# Patient Record
Sex: Female | Born: 1945 | Race: White | Hispanic: No | Marital: Married | Smoking: Never smoker
Health system: Southern US, Community
[De-identification: ages and names within clinical notes are randomized; demographics above are authoritative.]

## PROBLEM LIST (undated history)

## (undated) DIAGNOSIS — I509 Heart failure, unspecified: Secondary | ICD-10-CM

## (undated) DIAGNOSIS — J189 Pneumonia, unspecified organism: Secondary | ICD-10-CM

## (undated) DIAGNOSIS — E785 Hyperlipidemia, unspecified: Secondary | ICD-10-CM

## (undated) DIAGNOSIS — H409 Unspecified glaucoma: Secondary | ICD-10-CM

## (undated) DIAGNOSIS — N888 Other specified noninflammatory disorders of cervix uteri: Secondary | ICD-10-CM

## (undated) DIAGNOSIS — Z923 Personal history of irradiation: Secondary | ICD-10-CM

## (undated) DIAGNOSIS — Z8489 Family history of other specified conditions: Secondary | ICD-10-CM

## (undated) DIAGNOSIS — K760 Fatty (change of) liver, not elsewhere classified: Secondary | ICD-10-CM

## (undated) DIAGNOSIS — F329 Major depressive disorder, single episode, unspecified: Secondary | ICD-10-CM

## (undated) DIAGNOSIS — R002 Palpitations: Secondary | ICD-10-CM

## (undated) DIAGNOSIS — R739 Hyperglycemia, unspecified: Secondary | ICD-10-CM

## (undated) DIAGNOSIS — I2510 Atherosclerotic heart disease of native coronary artery without angina pectoris: Secondary | ICD-10-CM

## (undated) DIAGNOSIS — G4730 Sleep apnea, unspecified: Secondary | ICD-10-CM

## (undated) DIAGNOSIS — F32A Depression, unspecified: Secondary | ICD-10-CM

## (undated) DIAGNOSIS — C4490 Unspecified malignant neoplasm of skin, unspecified: Secondary | ICD-10-CM

## (undated) DIAGNOSIS — E7800 Pure hypercholesterolemia, unspecified: Secondary | ICD-10-CM

## (undated) DIAGNOSIS — M1990 Unspecified osteoarthritis, unspecified site: Secondary | ICD-10-CM

## (undated) DIAGNOSIS — J449 Chronic obstructive pulmonary disease, unspecified: Secondary | ICD-10-CM

## (undated) DIAGNOSIS — I10 Essential (primary) hypertension: Secondary | ICD-10-CM

## (undated) DIAGNOSIS — R0600 Dyspnea, unspecified: Secondary | ICD-10-CM

## (undated) DIAGNOSIS — I491 Atrial premature depolarization: Secondary | ICD-10-CM

## (undated) DIAGNOSIS — I82409 Acute embolism and thrombosis of unspecified deep veins of unspecified lower extremity: Secondary | ICD-10-CM

## (undated) DIAGNOSIS — N6019 Diffuse cystic mastopathy of unspecified breast: Secondary | ICD-10-CM

## (undated) DIAGNOSIS — C50912 Malignant neoplasm of unspecified site of left female breast: Secondary | ICD-10-CM

## (undated) DIAGNOSIS — I517 Cardiomegaly: Secondary | ICD-10-CM

## (undated) DIAGNOSIS — K219 Gastro-esophageal reflux disease without esophagitis: Secondary | ICD-10-CM

## (undated) DIAGNOSIS — I739 Peripheral vascular disease, unspecified: Secondary | ICD-10-CM

## (undated) DIAGNOSIS — Z8619 Personal history of other infectious and parasitic diseases: Secondary | ICD-10-CM

## (undated) DIAGNOSIS — R7989 Other specified abnormal findings of blood chemistry: Secondary | ICD-10-CM

## (undated) DIAGNOSIS — Z973 Presence of spectacles and contact lenses: Secondary | ICD-10-CM

## (undated) DIAGNOSIS — I6529 Occlusion and stenosis of unspecified carotid artery: Secondary | ICD-10-CM

## (undated) HISTORY — DX: Unspecified glaucoma: H40.9

## (undated) HISTORY — DX: Peripheral vascular disease, unspecified: I73.9

## (undated) HISTORY — DX: Depression, unspecified: F32.A

## (undated) HISTORY — DX: Diffuse cystic mastopathy of unspecified breast: N60.19

## (undated) HISTORY — PX: CARPAL TUNNEL RELEASE: SHX101

## (undated) HISTORY — PX: DILATION AND CURETTAGE OF UTERUS: SHX78

## (undated) HISTORY — DX: Major depressive disorder, single episode, unspecified: F32.9

## (undated) HISTORY — DX: Fatty (change of) liver, not elsewhere classified: K76.0

## (undated) HISTORY — DX: Hyperlipidemia, unspecified: E78.5

## (undated) HISTORY — PX: LAPAROTOMY FOR REMOVAL TUMOR LUMBAR PLEXES: SUR809

## (undated) HISTORY — DX: Occlusion and stenosis of unspecified carotid artery: I65.29

## (undated) HISTORY — DX: Essential (primary) hypertension: I10

## (undated) HISTORY — DX: Pure hypercholesterolemia, unspecified: E78.00

## (undated) HISTORY — DX: Personal history of other infectious and parasitic diseases: Z86.19

## (undated) HISTORY — DX: Pneumonia, unspecified organism: J18.9

## (undated) HISTORY — DX: Chronic obstructive pulmonary disease, unspecified: J44.9

## (undated) HISTORY — DX: Gastro-esophageal reflux disease without esophagitis: K21.9

---

## 1982-05-11 HISTORY — PX: BREAST EXCISIONAL BIOPSY: SUR124

## 1984-05-11 HISTORY — PX: BACK SURGERY: SHX140

## 2004-05-11 HISTORY — PX: CHOLECYSTECTOMY: SHX55

## 2004-06-06 ENCOUNTER — Ambulatory Visit: Payer: Self-pay | Admitting: General Surgery

## 2005-01-14 ENCOUNTER — Ambulatory Visit: Payer: Self-pay | Admitting: Internal Medicine

## 2005-02-17 ENCOUNTER — Ambulatory Visit: Payer: Self-pay | Admitting: Internal Medicine

## 2005-05-11 HISTORY — PX: BREAST BIOPSY: SHX20

## 2005-08-24 ENCOUNTER — Ambulatory Visit: Payer: Self-pay | Admitting: General Surgery

## 2006-02-01 ENCOUNTER — Ambulatory Visit: Payer: Self-pay | Admitting: Cardiology

## 2006-02-11 ENCOUNTER — Ambulatory Visit: Payer: Self-pay | Admitting: Internal Medicine

## 2006-04-13 ENCOUNTER — Ambulatory Visit: Payer: Self-pay | Admitting: Vascular Surgery

## 2006-05-11 HISTORY — PX: BREAST BIOPSY: SHX20

## 2006-08-24 ENCOUNTER — Ambulatory Visit: Payer: Self-pay | Admitting: Internal Medicine

## 2006-08-27 ENCOUNTER — Ambulatory Visit: Payer: Self-pay | Admitting: General Surgery

## 2006-08-31 ENCOUNTER — Ambulatory Visit: Payer: Self-pay | Admitting: General Surgery

## 2006-09-29 ENCOUNTER — Ambulatory Visit: Payer: Self-pay

## 2006-10-01 ENCOUNTER — Ambulatory Visit: Payer: Self-pay

## 2006-12-27 ENCOUNTER — Ambulatory Visit: Payer: Self-pay | Admitting: General Surgery

## 2008-01-10 ENCOUNTER — Ambulatory Visit: Payer: Self-pay | Admitting: General Surgery

## 2008-03-27 ENCOUNTER — Observation Stay: Payer: Self-pay | Admitting: Internal Medicine

## 2008-05-11 HISTORY — PX: COLONOSCOPY: SHX174

## 2009-03-14 ENCOUNTER — Ambulatory Visit: Payer: Self-pay | Admitting: General Surgery

## 2009-03-29 ENCOUNTER — Ambulatory Visit: Payer: Self-pay | Admitting: General Surgery

## 2009-05-11 HISTORY — PX: OTHER SURGICAL HISTORY: SHX169

## 2010-03-13 ENCOUNTER — Ambulatory Visit: Payer: Self-pay

## 2010-03-19 ENCOUNTER — Ambulatory Visit: Payer: Self-pay | Admitting: General Surgery

## 2010-05-11 LAB — HM MAMMOGRAPHY

## 2010-09-19 ENCOUNTER — Emergency Department: Payer: Self-pay | Admitting: Emergency Medicine

## 2010-12-03 ENCOUNTER — Ambulatory Visit: Payer: Self-pay | Admitting: Internal Medicine

## 2011-05-12 DIAGNOSIS — N6019 Diffuse cystic mastopathy of unspecified breast: Secondary | ICD-10-CM

## 2011-05-12 DIAGNOSIS — C4490 Unspecified malignant neoplasm of skin, unspecified: Secondary | ICD-10-CM

## 2011-05-12 HISTORY — PX: MOLE REMOVAL: SHX2046

## 2011-05-12 HISTORY — DX: Diffuse cystic mastopathy of unspecified breast: N60.19

## 2011-05-12 HISTORY — DX: Unspecified malignant neoplasm of skin, unspecified: C44.90

## 2011-07-01 ENCOUNTER — Ambulatory Visit: Payer: Self-pay | Admitting: General Surgery

## 2012-02-24 ENCOUNTER — Other Ambulatory Visit: Payer: Self-pay | Admitting: Internal Medicine

## 2012-02-24 ENCOUNTER — Encounter: Payer: Self-pay | Admitting: Internal Medicine

## 2012-02-24 ENCOUNTER — Ambulatory Visit (INDEPENDENT_AMBULATORY_CARE_PROVIDER_SITE_OTHER): Payer: Medicare Other | Admitting: Internal Medicine

## 2012-02-24 VITALS — BP 112/78 | HR 66 | Temp 98.2°F | Resp 16 | Ht 65.5 in | Wt 189.0 lb

## 2012-02-24 DIAGNOSIS — E875 Hyperkalemia: Secondary | ICD-10-CM

## 2012-02-24 DIAGNOSIS — I10 Essential (primary) hypertension: Secondary | ICD-10-CM

## 2012-02-24 DIAGNOSIS — K219 Gastro-esophageal reflux disease without esophagitis: Secondary | ICD-10-CM | POA: Insufficient documentation

## 2012-02-24 DIAGNOSIS — I739 Peripheral vascular disease, unspecified: Secondary | ICD-10-CM | POA: Insufficient documentation

## 2012-02-24 DIAGNOSIS — E7800 Pure hypercholesterolemia, unspecified: Secondary | ICD-10-CM | POA: Insufficient documentation

## 2012-02-24 NOTE — Assessment & Plan Note (Signed)
Controlled.  

## 2012-02-24 NOTE — Assessment & Plan Note (Signed)
On Vytorin. She is unable to tolerate other statins. She is able to tolerate this dose of Vytorin. Low cholesterol diet and exercise. Follow.

## 2012-02-24 NOTE — Patient Instructions (Signed)
It was nice seeing you today.  I will recheck you potassium and let you know once these results become available.  We will schedule a future follow up, but let me know if any problems before.

## 2012-02-24 NOTE — Assessment & Plan Note (Signed)
Sees Dr. Wyn Quaker.  He is status post stent placement. Legs are doing well. She has followup in November. He plans to check her carotids then as well. Continue risk factor modification.

## 2012-02-24 NOTE — Progress Notes (Signed)
  Subjective:    [name], female    DOB: 1946/03/31, 66 y.o.   MRN: [id number]  HPI 66 year old female with past history of hypertension, hypercholesterolemia and peripheral vascular disease.  She comes in today for a scheduled followup.  Back in the spring she was having some problems with some chest discomfort. Underwent cardiac testing and that was negative.  She states that she stopped her fish oil and her symptoms completely resolved. She feels good. Stays active. Reports no chest pain or tightness with increased activity or exertion States her blood pressure has been running in the 120's - systolic readings.  Recently was evaluated by vascular surgery. Saw Dr. Wyn Quaker.  Was noted to have some reflux (venous). Noted to be mild.  Felt no further workup warranted for that.  She does have a followup appointment scheduled in November for carotid evaluation. She reports no leg pain and no significant problems with swelling now.  Past Medical History  Diagnosis Date  . Depression   . History of chicken pox   . Glaucoma   . GERD (gastroesophageal reflux disease)   . Hyperlipidemia   . Hypertension   . Peripheral vascular disease     Review of Systems Patient denies any headache, lightheadedness or dizziness.  No chest pain, tightness or palpatations. No increased shortness of breath, cough or congestion.  No acid reflux, dysphagia or odynophagia. No nausea or vomiting.  No abdominal pain or cramping.  No bowel change, such as diarrhea, constipation, BRBPR or melana.  No urine change.  Doing well regarding stress.  Overall she feels good.      Objective:   Physical Exam Filed Vitals:   02/24/12 1033  BP: 112/78  Pulse: 66  Temp: 98.2 F (36.8 C)  Resp: 55   66year old female in no acute distress.   HEENT:  Nares - clear.  OP- without lesions or erythema.  NECK:  Supple, nontender.  No audible carotid bruit.   HEART:  Appears to be regular. LUNGS:  Without crackles or  wheezing audible.  Respirations even and unlabored.   RADIAL PULSE:  Equal bilaterally.  ABDOMEN:  Soft, nontender.  No audible abdominal bruit.   EXTREMITIES:  No increased edema to be present.                     Assessment & Plan:  Cardiovascular. Just recently underwent cardiac evaluation back in the spring. Everything checked out fine. She is currently asymptomatic. Continue risk factor modification.  Hyperkalemia.  Recheck potassium today.  Health maintenance. Will schedule physical.  Obtain recent labs to review cholesterol. States she had her last mammogram in November or December of 2013. Will obtain records to review. We'll need to get this scheduled after reviewing.  States her last colonoscopy was 2007.  She declines flu shot.

## 2012-02-24 NOTE — Assessment & Plan Note (Signed)
Blood pressure is under good control. Continue same med regimen. She states she recently had labs including metabolic panel. Obtain results. She did mention her potassium was elevated. We'll recheck today.

## 2012-05-10 ENCOUNTER — Other Ambulatory Visit: Payer: Self-pay | Admitting: *Deleted

## 2012-05-10 ENCOUNTER — Telehealth: Payer: Self-pay | Admitting: Internal Medicine

## 2012-05-10 MED ORDER — SPIRONOLACTONE 25 MG PO TABS: 25.0000 mg | ORAL_TABLET | Freq: Every day | ORAL | Status: DC

## 2012-05-10 NOTE — Telephone Encounter (Signed)
Filled script with zero refills;Appoint in 05/2012

## 2012-05-10 NOTE — Telephone Encounter (Signed)
[facility] [address] [facility] [phone] f  [phone]  Spironolactone 25mg  tablet 30.0 Refills 12 Take 1 tablet by mouth once day

## 2012-05-18 ENCOUNTER — Other Ambulatory Visit: Payer: Self-pay | Admitting: General Practice

## 2012-05-18 MED ORDER — LOSARTAN POTASSIUM-HCTZ 100-25 MG PO TABS: 1.0000 | ORAL_TABLET | Freq: Every day | ORAL | Status: DC

## 2012-05-18 NOTE — Telephone Encounter (Signed)
rx sent in for #90 with one refill.

## 2012-05-18 NOTE — Telephone Encounter (Signed)
Pt called stating she has an appt with you on 1/21 but needs a refill of HYZAAR ok to fill?

## 2012-05-31 ENCOUNTER — Other Ambulatory Visit (HOSPITAL_COMMUNITY)
Admission: RE | Admit: 2012-05-31 | Discharge: 2012-05-31 | Disposition: A | Discharge status: Home / Self Care | Payer: Medicare Other | Source: Ambulatory Visit | Attending: Internal Medicine | Admitting: Internal Medicine

## 2012-05-31 ENCOUNTER — Ambulatory Visit (INDEPENDENT_AMBULATORY_CARE_PROVIDER_SITE_OTHER): Payer: Medicare Other | Admitting: Internal Medicine

## 2012-05-31 ENCOUNTER — Encounter: Payer: Self-pay | Admitting: Internal Medicine

## 2012-05-31 VITALS — BP 120/60 | HR 70 | Temp 98.3°F | Ht 65.5 in | Wt 193.0 lb

## 2012-05-31 DIAGNOSIS — E041 Nontoxic single thyroid nodule: Secondary | ICD-10-CM

## 2012-05-31 DIAGNOSIS — Z01419 Encounter for gynecological examination (general) (routine) without abnormal findings: Secondary | ICD-10-CM | POA: Insufficient documentation

## 2012-05-31 DIAGNOSIS — Z1151 Encounter for screening for human papillomavirus (HPV): Secondary | ICD-10-CM | POA: Insufficient documentation

## 2012-05-31 DIAGNOSIS — K219 Gastro-esophageal reflux disease without esophagitis: Secondary | ICD-10-CM

## 2012-05-31 DIAGNOSIS — I10 Essential (primary) hypertension: Secondary | ICD-10-CM

## 2012-05-31 DIAGNOSIS — Z139 Encounter for screening, unspecified: Secondary | ICD-10-CM

## 2012-05-31 DIAGNOSIS — E7800 Pure hypercholesterolemia, unspecified: Secondary | ICD-10-CM

## 2012-05-31 DIAGNOSIS — I739 Peripheral vascular disease, unspecified: Secondary | ICD-10-CM

## 2012-05-31 DIAGNOSIS — I2510 Atherosclerotic heart disease of native coronary artery without angina pectoris: Secondary | ICD-10-CM | POA: Insufficient documentation

## 2012-06-05 ENCOUNTER — Encounter: Payer: Self-pay | Admitting: Internal Medicine

## 2012-06-05 NOTE — Assessment & Plan Note (Signed)
Blood pressure under good control.  Same medication.  Follow. Check metabolic panel.    

## 2012-06-05 NOTE — Progress Notes (Signed)
Subjective:    [name], female    DOB: 04-05-1946, 67 y.o.   MRN: [id number]  HPI 67 year old female with past history of hypertension, hypercholesterolemia and peripheral vascular disease.  She comes in today to follow up on these issues as well as for a complete physical exam.  Back in the spring she was having some problems with some chest discomfort. Underwent cardiac testing and that was negative.  She states that she stopped her fish oil and her symptoms completely resolved. She feels good. Stays active. Reports no chest pain or tightness with increased activity or exertion States her blood pressure has been running in the 120's - systolic readings.  Recently was evaluated by vascular surgery. Saw Dr. Wyn Quaker.  Was noted to have some reflux (venous). Noted to be mild.  Felt no further workup warranted for that.  Overall she feels good.  Staying active.  Bowels stable.   Past Medical History  Diagnosis Date  . Depression   . History of chicken pox   . Glaucoma   . GERD (gastroesophageal reflux disease)   . Hyperlipidemia   . Hypertension   . Peripheral vascular disease     Current Outpatient Prescriptions on File Prior to Visit  Medication Sig Dispense Refill  . aspirin (BAYER ASPIRIN) 325 MG tablet Take 325 mg by mouth daily.      [name] losartan-hydrochlorothiazide (HYZAAR) 100-25 MG per tablet Take 1 tablet by mouth daily.  90 tablet  1  . metoprolol (LOPRESSOR) 50 MG tablet Take 50 mg by mouth 2 (two) times daily.       [name] spironolactone (ALDACTONE) 25 MG tablet Take 1 tablet (25 mg total) by mouth daily.  30 tablet  0  . vitamin E 400 UNIT capsule Take 400 Units by mouth daily.      [name] VYTORIN 10-20 MG per tablet Take 1 tablet by mouth daily.         Review of Systems Patient denies any headache, lightheadedness or dizziness.  No chest pain, tightness or palpitations. No increased shortness of breath, cough or congestion.  No acid reflux, dysphagia or odynophagia. No  nausea or vomiting.  No abdominal pain or cramping.  No bowel change, such as diarrhea, constipation, BRBPR or melana.  No urine change.  Doing well regarding stress.  Overall she feels good.      Objective:   Physical Exam  Filed Vitals:   05/31/12 0854  BP: 120/60  Pulse: 70  Temp: 98.3 F (36.8 C)   Blood pressure recheck:  58/40  67 year old female in no acute distress.   HEENT:  Nares- clear.  Oropharynx - without lesions. NECK:  Supple.  Nontender.  No audible bruit.  HEART:  Appears to be regular. LUNGS:  No crackles or wheezing audible.  Respirations even and unlabored.  RADIAL PULSE:  Equal bilaterally.    BREASTS:  No nipple discharge or nipple retraction present.  Could not appreciate any distinct nodules or axillary adenopathy.  ABDOMEN:  Soft, nontender.  Bowel sounds present and normal.  No audible abdominal bruit.  GU:  Normal external genitalia.  Vaginal vault without lesions.  Cervix identified.  Pap performed. Could not appreciate any adnexal masses or tenderness.   RECTAL:  Heme negative.   EXTREMITIES:  No increased edema present.  DP pulses palpable and equal bilaterally.             Assessment & Plan:  Cardiovascular. Just recently underwent cardiac evaluation back  in the spring. Everything checked out fine. She is currently asymptomatic. Continue risk factor modification.  Had ECHO 12/04/11 - EF 60%, mild left ventricular hypertrophy and mild to moderate mitral and tricuspid insufficiency.    Health maintenance.  Physical today.  Pelvic and pap today.  Mammograms through Dr Evette Cristal.  Obtain records.  States she is up to date.  Colonoscopy 2010.  Per Dr Evette Cristal - due 2015.  IFOB today.   She declines flu shot.

## 2012-06-05 NOTE — Assessment & Plan Note (Signed)
Symptoms controlled.  Follow.   

## 2012-06-05 NOTE — Assessment & Plan Note (Signed)
On vytorin.  Check lipid profile and liver function.  Low cholesterol diet.

## 2012-06-05 NOTE — Assessment & Plan Note (Signed)
Followed by Dr Tedd Sias. Biopsy negative.  Check tsh, free t4 and free t3.

## 2012-06-05 NOTE — Assessment & Plan Note (Signed)
See above regarding w/up.  Is following with Dr Wyn Quaker.

## 2012-06-05 NOTE — Assessment & Plan Note (Signed)
Stable.  See above.  Just saw Dr Darrold Junker.  No changes made.  Asymptomatic.  Follow.

## 2012-06-07 ENCOUNTER — Other Ambulatory Visit (INDEPENDENT_AMBULATORY_CARE_PROVIDER_SITE_OTHER): Payer: Medicare Other

## 2012-06-07 DIAGNOSIS — I10 Essential (primary) hypertension: Secondary | ICD-10-CM

## 2012-06-07 DIAGNOSIS — E041 Nontoxic single thyroid nodule: Secondary | ICD-10-CM

## 2012-06-07 DIAGNOSIS — I2510 Atherosclerotic heart disease of native coronary artery without angina pectoris: Secondary | ICD-10-CM

## 2012-06-07 DIAGNOSIS — E7800 Pure hypercholesterolemia, unspecified: Secondary | ICD-10-CM

## 2012-06-07 DIAGNOSIS — E875 Hyperkalemia: Secondary | ICD-10-CM

## 2012-06-07 LAB — BASIC METABOLIC PANEL
CO2: 27 mEq/L (ref 19–32)
Chloride: 107 mEq/L (ref 96–112)
Creatinine, Ser: 1.2 mg/dL (ref 0.4–1.2)
Sodium: 141 mEq/L (ref 135–145)

## 2012-06-07 LAB — CBC WITH DIFFERENTIAL/PLATELET
Basophils Relative: 0.3 % (ref 0.0–3.0)
Eosinophils Absolute: 0.1 10*3/uL (ref 0.0–0.7)
Eosinophils Relative: 1.6 % (ref 0.0–5.0)
HCT: 38.3 % (ref 36.0–46.0)
Hemoglobin: 12.9 g/dL (ref 12.0–15.0)
Lymphs Abs: 1.4 10*3/uL (ref 0.7–4.0)
MCHC: 33.5 g/dL (ref 30.0–36.0)
MCV: 94.3 fl (ref 78.0–100.0)
Monocytes Absolute: 0.7 10*3/uL (ref 0.1–1.0)
Neutro Abs: 3.4 10*3/uL (ref 1.4–7.7)
RBC: 4.07 Mil/uL (ref 3.87–5.11)
WBC: 5.5 10*3/uL (ref 4.5–10.5)

## 2012-06-07 LAB — HEPATIC FUNCTION PANEL
ALT: 21 U/L (ref 0–35)
Alkaline Phosphatase: 41 U/L (ref 39–117)
Bilirubin, Direct: 0.1 mg/dL (ref 0.0–0.3)
Total Bilirubin: 1 mg/dL (ref 0.3–1.2)
Total Protein: 7 g/dL (ref 6.0–8.3)

## 2012-06-07 LAB — T4, FREE: Free T4: 0.67 ng/dL (ref 0.60–1.60)

## 2012-06-07 LAB — LIPID PANEL
Cholesterol: 187 mg/dL (ref 0–200)
Total CHOL/HDL Ratio: 5
VLDL: 40.8 mg/dL — ABNORMAL HIGH (ref 0.0–40.0)

## 2012-06-08 ENCOUNTER — Other Ambulatory Visit: Payer: Self-pay | Admitting: Internal Medicine

## 2012-06-08 DIAGNOSIS — N289 Disorder of kidney and ureter, unspecified: Secondary | ICD-10-CM

## 2012-06-08 NOTE — Progress Notes (Signed)
Order placed for follow up lab

## 2012-06-11 ENCOUNTER — Telehealth: Payer: Self-pay | Admitting: Internal Medicine

## 2012-06-11 MED ORDER — SPIRONOLACTONE 25 MG PO TABS: 25.0000 mg | ORAL_TABLET | Freq: Every day | ORAL | Status: DC

## 2012-06-11 NOTE — Telephone Encounter (Signed)
Refilled spironolactone #30 with 5 refills

## 2012-06-18 ENCOUNTER — Encounter: Payer: Self-pay | Admitting: *Deleted

## 2012-06-21 ENCOUNTER — Other Ambulatory Visit (INDEPENDENT_AMBULATORY_CARE_PROVIDER_SITE_OTHER): Payer: Medicare Other

## 2012-06-21 DIAGNOSIS — E7800 Pure hypercholesterolemia, unspecified: Secondary | ICD-10-CM

## 2012-06-21 DIAGNOSIS — K219 Gastro-esophageal reflux disease without esophagitis: Secondary | ICD-10-CM

## 2012-06-21 DIAGNOSIS — Z139 Encounter for screening, unspecified: Secondary | ICD-10-CM

## 2012-06-21 DIAGNOSIS — I10 Essential (primary) hypertension: Secondary | ICD-10-CM

## 2012-06-21 DIAGNOSIS — I739 Peripheral vascular disease, unspecified: Secondary | ICD-10-CM

## 2012-06-21 LAB — FECAL OCCULT BLOOD, IMMUNOCHEMICAL: Fecal Occult Bld: NEGATIVE

## 2012-06-27 ENCOUNTER — Encounter: Payer: Self-pay | Admitting: *Deleted

## 2012-07-05 ENCOUNTER — Telehealth: Payer: Self-pay | Admitting: Internal Medicine

## 2012-07-05 NOTE — Telephone Encounter (Signed)
Patient stated that she feels like she is doing better. I advised patient per Dr. Lorin Picket to seek acute care if symptoms are acute. Patient also stated that her cough is a little better Patient has had temperature since Saturday. Patient stated that if temperature continued until tomorrow that she would go to acute care.

## 2012-07-05 NOTE — Telephone Encounter (Signed)
I am unable to work in today.  It does not appear Lindsey Bentley has opening.  If acute and needs eval today, then recommend acute care and we will follow up with her afterwards.

## 2012-07-05 NOTE — Telephone Encounter (Signed)
Patient Information:  Caller Name: Lindsey Bentley  Phone: [phone]  Patient: Lindsey Bentley, [place]  Gender: Female  DOB: 1945/06/19  Age: 67 Years  PCP: [facility] [place]  Office Follow Up:  Does the office need to follow up with this patient?: Yes  Instructions For The Office: No appts. available in Epic Electronic Health Record. PLEASE RETURN CALL TO PATIENT AT [phone] REGARDING APPT. NEED FOR DISPOSITION OF " SEE TODAY IN OFFICE."  RN Note:  Patient states she developed fever, cough. Onset 07/02/12. States temp as high as 101 per temporal scan 07/03/12. States temp. 99.1 orally 07/05/12. Patient taking fluids well. Denies sore throat. Patient states she feels like her eyes "hurt." Patient denies sinus pain or congestion. Patient states she has severe coughing spasms at intervals. States she is feeling better at present but had a severe coughing episode 07/05/12 a.m. Care advice given per guidelines. Call back parameters reviewed. Patient verbalizes understanding. No appts. available in Epic Electronic Health Record. PLEASE RETURN CALL TO PATIENT AT [phone] REGARDING APPT. NEED FOR DISPOSITION OF " SEE TODAY IN OFFICE."  Symptoms  Reason For Call & Symptoms: Cough, fever  Reviewed Health History In EMR: Yes  Reviewed Medications In EMR: Yes  Reviewed Allergies In EMR: Yes  Reviewed Surgeries / Procedures: Yes  Date of Onset of Symptoms: 07/02/2012  Treatments Tried: Hall's Cough Drops  Treatments Tried Worked: Yes  Any Fever: Yes  Fever Taken: Ear Thermometer  Fever Time Of Reading: 10:00:00  Fever Last Reading: 99.1  Guideline(s) Used:  Cough  Disposition Per Guideline:   See Today in Office  Reason For Disposition Reached:   Severe coughing spells (e.g., whooping sound after coughing, vomiting after coughing)  Advice Given:  Reassurance  Coughing is the way that our lungs remove irritants and mucus. It helps protect our lungs from getting pneumonia.  You can also get  a cough after being exposed to irritating substances like smoke, strong perfumes, and dust.  Coughing Spasms:  Drink warm fluids. Inhale warm mist (Reason: both relax the airway and loosen up the phlegm).  Suck on cough drops or hard candy to coat the irritated throat.  Avoid Tobacco Smoke:  Smoking or being exposed to smoke makes coughs much worse.  Call Back If:  Difficulty breathing  Cough lasts more than 3 weeks  Fever lasts > 3 days  You become worse.

## 2012-07-06 ENCOUNTER — Other Ambulatory Visit: Payer: Medicare Other

## 2012-07-06 NOTE — Telephone Encounter (Signed)
Pt was seen at [facility] walk-in this morning at 8:30.  Pt states she was diagnosed with pneumonia and prescribed three medications, prednisone, a cough medicine and another one (pt does not have prescriptions with her at time of call).  Pt states she was to have labs on Fri and is rescheduling the labs to a later date.

## 2012-07-08 ENCOUNTER — Other Ambulatory Visit: Payer: Medicare Other

## 2012-07-08 NOTE — Telephone Encounter (Signed)
Scheduled pt aware

## 2012-07-08 NOTE — Telephone Encounter (Signed)
Tell her that I will follow up with her here.  Would like to see her at 9:45 on 07/20/12.  (acute care follow up).  Let us know if any problems before.  Can order xray then.

## 2012-07-08 NOTE — Telephone Encounter (Signed)
[facility] walk-in clinic called the patient wanting her to come back in for a repeat x-ray . She wants to know can Dr. Lorin Picket can take over her care and request the x-ray in 3 weeks at the hospital to monitor her pneumonia.

## 2012-07-09 DIAGNOSIS — J189 Pneumonia, unspecified organism: Secondary | ICD-10-CM

## 2012-07-09 HISTORY — DX: Pneumonia, unspecified organism: J18.9

## 2012-07-13 ENCOUNTER — Telehealth: Payer: Self-pay | Admitting: Internal Medicine

## 2012-07-13 ENCOUNTER — Observation Stay: Payer: Self-pay | Admitting: Internal Medicine

## 2012-07-13 LAB — PROTIME-INR
INR: 0.9 (ref 0.9–1.1)
INR: 0.9
Prothrombin Time: 12.8 secs (ref 11.5–14.7)

## 2012-07-13 LAB — CK TOTAL AND CKMB (NOT AT ARMC)
CK, Total: 80 U/L (ref 21–215)
CK, Total: 59 U/L (ref 21–215)
CK-MB: <0.5 ng/mL — ABNORMAL LOW (ref 0.5–3.6)
CK-MB: <0.5 ng/mL — ABNORMAL LOW (ref 0.5–3.6)

## 2012-07-13 LAB — COMPREHENSIVE METABOLIC PANEL
Alkaline Phosphatase: 49 U/L — ABNORMAL LOW (ref 50–136)
Calcium, Total: 9 mg/dL (ref 8.5–10.1)
Chloride: 106 mmol/L (ref 98–107)
Creatinine: 1.32 mg/dL — ABNORMAL HIGH (ref 0.60–1.30)
EGFR (Non-African Amer.): 42 — ABNORMAL LOW
Glucose: 95 mg/dL (ref 65–99)
Osmolality: 282 (ref 275–301)
Potassium: 4.2 mmol/L (ref 3.5–5.1)
SGPT (ALT): 73 U/L (ref 12–78)
Sodium: 137 mmol/L (ref 136–145)
Total Protein: 7.6 g/dL (ref 6.4–8.2)

## 2012-07-13 LAB — CBC
HGB: 12.8 g/dL (ref 12.0–16.0)
MCHC: 34.3 g/dL (ref 32.0–36.0)
Platelet: 261 10*3/uL (ref 150–440)
WBC: 12.5 10*3/uL — ABNORMAL HIGH (ref 3.6–11.0)

## 2012-07-13 LAB — URINALYSIS, COMPLETE
Bacteria: NONE SEEN
Bilirubin,UR: NEGATIVE
Blood: NEGATIVE
Hyaline Cast: 9
Leukocyte Esterase: NEGATIVE
WBC UR: 1 /HPF (ref 0–5)

## 2012-07-13 LAB — RAPID INFLUENZA A&B ANTIGENS

## 2012-07-13 LAB — TROPONIN I: Troponin-I: <0.02 ng/mL

## 2012-07-13 NOTE — Telephone Encounter (Signed)
Patient Information:  Caller Name: Yarielis  Phone: [phone]  Patient: Lindsey Bentley, Lindsey Bentley  Gender: Female  DOB: 1945-08-08  Age: 67 Years  PCP: [facility] [place]  Office Follow Up:  Does the office need to follow up with this patient?: Yes  Instructions For The Office: Just wanted to let MD know that 911 was advised for this pt who was calling today about nausea and blood in stools, but during course of assessment said she has been having chest pain this AM.  Per guidelines advised 911.  Thanks.  RN Note:  Triager advised pt to call 911.  Symptoms  Reason For Call & Symptoms: Pt calling today 07/13/12 regarding was diagnosed with PNE on 07/03/12.  She started with being sick on 06/29/12.  Was prescribed Prednisone, antibiotics and cough medication.  She has finished all that and now feeling sick in her stomach and blood in her stool.  No vomiting.  Formed stool with bright red blood on paper.  Pt also reports having chest pain this AM and was not associated with coughing.  Reviewed Health History In EMR: Yes  Reviewed Medications In EMR: Yes  Reviewed Allergies In EMR: Yes  Reviewed Surgeries / Procedures: Yes  Date of Onset of Symptoms: 07/12/2012  Guideline(s) Used:  Chest Pain  Disposition Per Guideline:   Call EMS 911 Now  Reason For Disposition Reached:   Chest pain lasting longer than 5 minutes and ANY of the following:  Over 21 years old Over 29 years old and at least one cardiac risk factor (i.e., high blood pressure, diabetes, high cholesterol, obesity, smoker or strong family history of heart disease) Pain is crushing, pressure-like, or heavy  Took nitroglycerin and chest pain was not relieved History of heart disease (i.e., angina, heart attack, bypass surgery, angioplasty, CHF)  Advice Given:  N/A

## 2012-07-14 LAB — CBC WITH DIFFERENTIAL/PLATELET
Eosinophil %: 1.8 %
HCT: 39.4 % (ref 35.0–47.0)
HGB: 13.4 g/dL (ref 12.0–16.0)
Lymphocyte %: 32.4 %
MCHC: 34 g/dL (ref 32.0–36.0)
Monocyte #: 0.8 x10 3/mm (ref 0.2–0.9)
Monocyte %: 6.5 %
Neutrophil #: 7.1 10*3/uL — ABNORMAL HIGH (ref 1.4–6.5)
Platelet: 265 10*3/uL (ref 150–440)
RBC: 4.24 10*6/uL (ref 3.80–5.20)
WBC: 12.1 10*3/uL — ABNORMAL HIGH (ref 3.6–11.0)

## 2012-07-14 LAB — BASIC METABOLIC PANEL
Anion Gap: 10 (ref 7–16)
Calcium, Total: 8 mg/dL — ABNORMAL LOW (ref 8.5–10.1)
Chloride: 112 mmol/L — ABNORMAL HIGH (ref 98–107)
Co2: 19 mmol/L — ABNORMAL LOW (ref 21–32)
EGFR (African American): 57 — ABNORMAL LOW
Glucose: 85 mg/dL (ref 65–99)
Potassium: 4.4 mmol/L (ref 3.5–5.1)

## 2012-07-14 LAB — CK TOTAL AND CKMB (NOT AT ARMC): CK, Total: 62 U/L (ref 21–215)

## 2012-07-15 DIAGNOSIS — R55 Syncope and collapse: Secondary | ICD-10-CM

## 2012-07-15 LAB — URINE CULTURE

## 2012-07-15 LAB — OCCULT BLOOD X 1 CARD TO LAB, STOOL: Occult Blood, Feces: NEGATIVE

## 2012-07-18 ENCOUNTER — Other Ambulatory Visit (INDEPENDENT_AMBULATORY_CARE_PROVIDER_SITE_OTHER): Payer: Medicare Other

## 2012-07-18 DIAGNOSIS — N289 Disorder of kidney and ureter, unspecified: Secondary | ICD-10-CM

## 2012-07-19 ENCOUNTER — Encounter: Payer: Self-pay | Admitting: *Deleted

## 2012-07-19 LAB — BASIC METABOLIC PANEL
BUN: 21 mg/dL (ref 6–23)
Calcium: 9.3 mg/dL (ref 8.4–10.5)
Chloride: 103 mEq/L (ref 96–112)
Creatinine, Ser: 1 mg/dL (ref 0.4–1.2)

## 2012-07-19 LAB — CULTURE, BLOOD (SINGLE)

## 2012-07-20 ENCOUNTER — Telehealth: Payer: Self-pay | Admitting: General Practice

## 2012-07-20 ENCOUNTER — Ambulatory Visit (INDEPENDENT_AMBULATORY_CARE_PROVIDER_SITE_OTHER): Payer: Medicare Other | Admitting: Internal Medicine

## 2012-07-20 ENCOUNTER — Encounter: Payer: Self-pay | Admitting: Internal Medicine

## 2012-07-20 VITALS — BP 140/80 | HR 66 | Temp 98.5°F | Ht 65.5 in | Wt 187.2 lb

## 2012-07-20 DIAGNOSIS — K219 Gastro-esophageal reflux disease without esophagitis: Secondary | ICD-10-CM

## 2012-07-20 DIAGNOSIS — I739 Peripheral vascular disease, unspecified: Secondary | ICD-10-CM

## 2012-07-20 DIAGNOSIS — I10 Essential (primary) hypertension: Secondary | ICD-10-CM

## 2012-07-20 NOTE — Telephone Encounter (Signed)
Please call if you did not receive these medical records.

## 2012-07-20 NOTE — Telephone Encounter (Signed)
Records received

## 2012-07-21 ENCOUNTER — Encounter: Payer: Self-pay | Admitting: Internal Medicine

## 2012-07-21 ENCOUNTER — Other Ambulatory Visit: Payer: Self-pay | Admitting: *Deleted

## 2012-07-21 NOTE — Assessment & Plan Note (Signed)
Controlled.  

## 2012-07-21 NOTE — Progress Notes (Signed)
Subjective:    [name], female    DOB: 08-12-1945, 67 y.o.   MRN: [id number]  HPI 67 year old female with past history of hypertension, hypercholesterolemia and peripheral vascular disease.  She comes in today for a hospital follow up.  She was seen on 07/06/12 and diagnosed with pneumonia.  She was placed on Levaquin and prednisone.  On 07/13/12, she called secondary to persistent weakness, BRBPR with wiping and chest discomfort.  Went to ER and was admitted.  Found to be hypotensive and had a syncopal episode in the ER.  Noted to have some renal insufficiency which improved with hydration.  It was felt her syncope was due to dehydration and hypovolemia.  Her spironolactone was held.  With the hydration, she felt better and was discharged.  While in the hospital she had an ECHO that revealed EF 60-65%, impaired relaxation pattern of LV diastolic filling, mild concentric LVH.  Carotid ultrasound revealed no visualized definite atherosclerotic severe stenosis.  She reports she is feeling better.  Is eating and drinking.  No nausea or vomiting.  Bowels normal.    Past Medical History  Diagnosis Date  . Depression   . History of chicken pox   . Glaucoma   . GERD (gastroesophageal reflux disease)   . Hyperlipidemia   . Hypertension   . Peripheral vascular disease   . Diffuse cystic mastopathy 2013  . High cholesterol     Current Outpatient Prescriptions on File Prior to Visit  Medication Sig Dispense Refill  . aspirin (BAYER ASPIRIN) 325 MG tablet Take 325 mg by mouth daily.      [name] losartan-hydrochlorothiazide (HYZAAR) 100-25 MG per tablet Take 1 tablet by mouth daily.  90 tablet  1  . metoprolol (LOPRESSOR) 50 MG tablet Take 50 mg by mouth 2 (two) times daily.       . vitamin E 400 UNIT capsule Take 400 Units by mouth daily.      [name] VYTORIN 10-20 MG per tablet Take 1 tablet by mouth daily.        No current facility-administered medications on file prior to visit.    Review  of Systems Patient denies any headache, lightheadedness or dizziness.  No chest pain, tightness or palpitations. No increased shortness of breath, cough or congestion.  No acid reflux, dysphagia or odynophagia. No nausea or vomiting.  No abdominal pain or cramping.  No bowel change, such as diarrhea, constipation, or melana.  She had the one episode of BRBPR prior to going in to the hospital.  Was BRB on the tissue with wiping - after a bowel movement.  No blood in the stool.  No further episodes in the hospital or after discharge.  No urine change.  Still with some fatigue, but is gradually improving.      Objective:   Physical Exam  Filed Vitals:   07/20/12 0945  BP: 140/80  Pulse: 66  Temp: 98.5 F (36.9 C)   Blood pressure recheck:  126/78 standing and 128/78 lying.   67 year old female in no acute distress.   HEENT:  Nares- clear.  Oropharynx - without lesions. NECK:  Supple.  Nontender.  No audible bruit.  HEART:  Appears to be regular. LUNGS:  No crackles or wheezing audible.  Respirations even and unlabored.  RADIAL PULSE:  Equal bilaterally.   ABDOMEN:  Soft, nontender.  Bowel sounds present and normal.  No audible abdominal bruit.    EXTREMITIES:  No increased edema present.  DP  pulses palpable and equal bilaterally.             Assessment & Plan:  Cardiovascular. Just recently underwent cardiac evaluation back in the spring. Everything checked out fine. She is currently asymptomatic. Continue risk factor modification.  Had ECHO 12/04/11 - EF 60%, mild left ventricular hypertrophy and mild to moderate mitral and tricuspid insufficiency.  Had follow up ECHO in the hospital as outlined above.  Currently asymptomatic.  Follow.    SYNCOPE.  Felt to be related to hypotension and hypovolemia.  Has had no further episodes.  Blood pressure as outlined.  Will remain off spironolactone for now.  Follow.    PNEUMONIA.  Was treated.  No cough or congestion now.  CXR in acute care 07/06/12  revealed mild interstitial opacities in the mid and lower left lung.  She was also noted to have a small nodular density overlying the mid thoracic spine on the lateral view.  Felt possibly to be related to the opacities.  A repeat cxr in the hospital (07/13/12) revealed no acute cardiopulmonary disease.  Currently asymptomatic.  Will plan for a follow up pa and lateral cxr witin the next few weeks to confirm clear.  The cxr in the hospital was just an AP cxr.  Follow.   RENAL INSUFFICIENCY.  Noted in the hospital.  Improved with hydration.  Cr just checked - 1.0.  Follow.  Stay hydrated.     Health maintenance.  Physical last visit.  Mammograms through Dr Evette Cristal.  Obtain records.  States she is up to date.  Colonoscopy 2010.  Per Dr Evette Cristal - due 2015.

## 2012-07-21 NOTE — Assessment & Plan Note (Signed)
Blood pressure as outlined.  Remain off spironolactone.  She will spot check her pressure.  Get her back in soon to reassess.  Recent metabolic panel - ok.

## 2012-07-21 NOTE — Assessment & Plan Note (Signed)
Sees Dr. Wyn Quaker.  He is status post stent placement. Legs are doing well. Continue risk factor modification.  Apparently Dr Wyn Quaker checked her carotids in the fall.  Obtain records.  Forward most recent carotid ultrasound to Dr Wyn Quaker for review.

## 2012-07-22 ENCOUNTER — Ambulatory Visit: Payer: Self-pay | Admitting: General Surgery

## 2012-07-22 MED ORDER — METOPROLOL TARTRATE 50 MG PO TABS: 50.0000 mg | ORAL_TABLET | Freq: Two times a day (BID) | ORAL | Status: DC

## 2012-07-22 NOTE — Telephone Encounter (Signed)
Sent in to pharmacy.  

## 2012-07-26 ENCOUNTER — Encounter: Payer: Self-pay | Admitting: *Deleted

## 2012-07-27 ENCOUNTER — Ambulatory Visit (INDEPENDENT_AMBULATORY_CARE_PROVIDER_SITE_OTHER): Payer: Medicare Other | Admitting: General Surgery

## 2012-07-27 ENCOUNTER — Encounter: Payer: Self-pay | Admitting: General Surgery

## 2012-07-27 VITALS — BP 112/60 | HR 70 | Resp 14 | Ht 65.0 in | Wt 190.0 lb

## 2012-07-27 DIAGNOSIS — N6019 Diffuse cystic mastopathy of unspecified breast: Secondary | ICD-10-CM

## 2012-07-27 NOTE — Progress Notes (Signed)
Subjective:     [name], female   DOB: [birth year], 67 y.o.   MRN: [id number]  HPI 67 yr old female with fibrocystic breast disease seen for yearly follow up. No breast complaints. Family History- maternal aunt had breast cancer. There is Family History of colon polyps-sister. Patient recovering from hospitalization for recent pneumonia. Patient has had previous core biopsies bilaterally showing fibrocystic disease.  Review of Systems  Constitutional: Negative.   Respiratory: Negative.   Cardiovascular: Positive for leg swelling.  Generalized swelling legs and hands.     Objective:   Physical Exam  Constitutional: She is oriented to person, place, and time. She appears well-developed and well-nourished.  Cardiovascular: Normal rate and regular rhythm.   Pulmonary/Chest: Effort normal and breath sounds normal. Right breast exhibits no inverted nipple, no mass, no nipple discharge and no tenderness. Left breast exhibits no inverted nipple, no mass, no nipple discharge and no tenderness. Breasts are symmetrical.  Abdominal: Soft. Bowel sounds are normal. There is no tenderness.  Lymphadenopathy:    She has no cervical adenopathy.    She has no axillary adenopathy.  Neurological: She is alert and oriented to person, place, and time.       Assessment:     Mammogram reviewed. Birads 2 per radiologist. There is one area in left breast superolateral-may have few more microcalcs now compared to last yr. Will review this with radiologist     Plan:     1 yr f/u with screening mammogram

## 2012-08-04 ENCOUNTER — Encounter: Payer: Self-pay | Admitting: Adult Health

## 2012-08-04 ENCOUNTER — Telehealth: Payer: Self-pay | Admitting: Internal Medicine

## 2012-08-04 ENCOUNTER — Ambulatory Visit (INDEPENDENT_AMBULATORY_CARE_PROVIDER_SITE_OTHER): Payer: Medicare Other | Admitting: Adult Health

## 2012-08-04 VITALS — BP 131/91 | HR 85 | Temp 99.0°F | Resp 16 | Ht 66.0 in | Wt 190.0 lb

## 2012-08-04 DIAGNOSIS — J111 Influenza due to unidentified influenza virus with other respiratory manifestations: Secondary | ICD-10-CM

## 2012-08-04 DIAGNOSIS — R197 Diarrhea, unspecified: Secondary | ICD-10-CM

## 2012-08-04 DIAGNOSIS — R509 Fever, unspecified: Secondary | ICD-10-CM

## 2012-08-04 LAB — POCT INFLUENZA A/B
Influenza A, POC: NEGATIVE
Influenza B, POC: NEGATIVE

## 2012-08-04 MED ORDER — AZITHROMYCIN 250 MG PO TABS: ORAL_TABLET | ORAL | Status: DC

## 2012-08-04 NOTE — Telephone Encounter (Signed)
Patient scheduled for appointment with Raquel for today 08/04/12

## 2012-08-04 NOTE — Telephone Encounter (Signed)
Patient Information:  Caller Name: Audree  Phone: [phone]  Patient: Lindsey Bentley, Lindsey Bentley  Gender: Female  DOB: [birth year]  Age: 67 Years  PCP: [facility]  Office Follow Up:  Does the office need to follow up with this patient?: Yes  Instructions For The Office: Please call back about work in appointment; needs to be seen within 4 hours.  RN Note:  Called to report dry cough with fever and body aches. Hospitalized win last 2-3 weeks for pneumonia. Exposed to grandchildren who have cough/fever.  BP 175/82 L arm at 0630; 168/79 at 0800, 152/81 at 1000. Asking if safe to continue to take Mucinex-DM with BP meds. RPh advised OK to take with BP meds but to record BP measurements at intervals. No appointment remain with any provider within the next 4 hours; urgent message sent to staff for call back regarding possible work in appointment.    Symptoms  Reason For Call & Symptoms: Dry cough with fever. Hospitalized with pneumonia 2-3 weeks ago  Reviewed Health History In EMR: Yes  Reviewed Medications In EMR: Yes  Reviewed Allergies In EMR: Yes  Reviewed Surgeries / Procedures: Yes  Date of Onset of Symptoms: 08/03/2012  Treatments Tried: Tylenol, Mucinex-DM, Halls cough drops.  Treatments Tried Worked: Yes  Any Fever: Yes  Fever Taken: Ear Thermometer  Fever Time Of Reading: 08:00:00  Fever Last Reading: 100.6  Guideline(s) Used:  Cough  Disposition Per Guideline:   Go to Office Now  Reason For Disposition Reached:   Fever > 100.5 F (38.1 C) and over 41 years of age  Advice Given:  Reassurance  Coughing is the way that our lungs remove irritants and mucus. It helps protect our lungs from getting pneumonia.  Reassurance  Coughing is the way that our lungs remove irritants and mucus. It helps protect our lungs from getting pneumonia.  Cough Medicines:  OTC Cough Drops: Cough drops can help a lot, especially for mild coughs. They reduce coughing by soothing your irritated  throat and removing that tickle sensation in the back of the throat. Cough drops also have the advantage of portability - you can carry them with you.  Home Remedy - Hard Candy: Hard candy works just as well as medicine-flavored OTC cough drops. Diabetics should use sugar-free candy.  Coughing Spasms:  Drink warm fluids. Inhale warm mist (Reason: both relax the airway and loosen up the phlegm).  Suck on cough drops or hard candy to coat the irritated throat.  Prevent Dehydration:  Drink adequate liquids.  Fever Medicines:  For fevers above 101 F (38.3 C) take either acetaminophen or ibuprofen.  Acetaminophen (e.g., Tylenol):  Regular Strength Tylenol: Take 650 mg (two 325 mg pills) by mouth every 4-6 hours as needed. Each Regular Strength Tylenol pill has 325 mg of acetaminophen.  Extra Strength Tylenol: Take 1,000 mg (two 500 mg pills) every 8 hours as needed. Each Extra Strength Tylenol pill has 500 mg of acetaminophen.  Call Back If:  Difficulty breathing  Cough lasts more than 3 weeks  Fever lasts > 3 days  You become worse.  Patient Will Follow Care Advice:  YES

## 2012-08-04 NOTE — Telephone Encounter (Signed)
Sent to Call a Nurse

## 2012-08-04 NOTE — Telephone Encounter (Signed)
I can see her tomorrow, but with symptoms - Call a nurse  - feels needs eval today.  Apparently Lindsey Bentley has an opening at 4:00.  Please schedule.  Thanks.

## 2012-08-04 NOTE — Patient Instructions (Addendum)
  Please start your antibiotic today.  Drink fluids to stay hydrated.  Eat bland foods.  You can also have imodium for the diarrhea.

## 2012-08-04 NOTE — Progress Notes (Signed)
  Subjective:    [name], female    DOB: [birth year], 67 y.o.   MRN: [id number]  HPI  Patient presents to clinic with cough, fever as high as 102, chills and body aches. She reports symptoms began yesterday. She has also had diarrhea x 2 today. She has been taking Mucinex DM but does not feel that this is helping. Patient reports recent treatment for pneumonia.   Current Outpatient Prescriptions on File Prior to Visit  Medication Sig Dispense Refill  . aspirin (BAYER ASPIRIN) 325 MG tablet Take 325 mg by mouth daily.      [name] losartan-hydrochlorothiazide (HYZAAR) 100-25 MG per tablet Take 1 tablet by mouth daily.  90 tablet  1  . metoprolol (LOPRESSOR) 50 MG tablet Take 1 tablet (50 mg total) by mouth 2 (two) times daily.  60 tablet  5  . vitamin E 400 UNIT capsule Take 400 Units by mouth daily.      [name] VYTORIN 10-20 MG per tablet Take 1 tablet by mouth daily.        No current facility-administered medications on file prior to visit.     Review of Systems  Constitutional: Positive for fever and chills.  HENT: Negative for congestion, sore throat, postnasal drip and sinus pressure.   Respiratory: Positive for cough. Negative for shortness of breath and wheezing.   Gastrointestinal: Positive for diarrhea. Negative for nausea and vomiting.  Neurological: Positive for headaches.   BP 131/91  Pulse 85  Temp(Src) 99 F (37.2 C) (Oral)  Resp 16  Ht 5\' 6"  (1.676 m)  Wt 190 lb (86.183 kg)  BMI 30.68 kg/m2  SpO2 98%     Objective:   Physical Exam  Constitutional: She is oriented to person, place, and time. She appears well-developed and well-nourished.  Apparent she is not feeling well.  Cardiovascular: Normal rate and regular rhythm.  Exam reveals no gallop.   No murmur heard. Pulmonary/Chest: Effort normal and breath sounds normal. No respiratory distress. She has no wheezes. She has no rales.  Abdominal: Soft.  Neurological: She is alert and oriented to person,  place, and time.  Skin: Skin is warm and dry.  Psychiatric: She has a normal mood and affect. Her behavior is normal.       Assessment & Plan:

## 2012-08-04 NOTE — Telephone Encounter (Signed)
Please advise if pt needs to be seen at our office.

## 2012-08-05 ENCOUNTER — Encounter: Payer: Self-pay | Admitting: Adult Health

## 2012-08-05 DIAGNOSIS — R945 Abnormal results of liver function studies: Secondary | ICD-10-CM | POA: Insufficient documentation

## 2012-08-05 DIAGNOSIS — R7989 Other specified abnormal findings of blood chemistry: Secondary | ICD-10-CM | POA: Insufficient documentation

## 2012-08-05 NOTE — Telephone Encounter (Signed)
Patient has had her appt.

## 2012-08-05 NOTE — Assessment & Plan Note (Signed)
She has had 2 episodes of diarrhea today. I have encouraged her to drink fluids to stay hydrated and to eat a bland diet. She may take Imodium if diarrhea persists. If no resolution of diarrhea within one to 2 days she will need to be seen.

## 2012-08-05 NOTE — Assessment & Plan Note (Signed)
Patient was recently hospitalized for pneumonia within the last 2-3 weeks. She presents to clinic this afternoon with symptoms of dry cough, chills, fever as high as 102 and general body aches. She has a low-grade temp now. Patient reports she has not received the flu vaccine. Negative for influenza her nasal swab. Given her recent hospitalization for pneumonia, I will start her on antibiotics. Start azithromycin. If symptoms are not improved within 3-4 days she should return to clinic.

## 2012-08-10 ENCOUNTER — Other Ambulatory Visit: Payer: Self-pay | Admitting: *Deleted

## 2012-08-11 MED ORDER — EZETIMIBE-SIMVASTATIN 10-20 MG PO TABS: 1.0000 | ORAL_TABLET | Freq: Every day | ORAL | Status: DC

## 2012-08-11 NOTE — Telephone Encounter (Signed)
Rx sent in to pharmacy. 

## 2012-08-18 ENCOUNTER — Ambulatory Visit (INDEPENDENT_AMBULATORY_CARE_PROVIDER_SITE_OTHER): Payer: Medicare Other | Admitting: Internal Medicine

## 2012-08-18 ENCOUNTER — Encounter: Payer: Self-pay | Admitting: Internal Medicine

## 2012-08-18 VITALS — BP 132/78 | HR 69 | Temp 97.6°F | Resp 18 | Wt 184.8 lb

## 2012-08-18 DIAGNOSIS — K219 Gastro-esophageal reflux disease without esophagitis: Secondary | ICD-10-CM

## 2012-08-18 DIAGNOSIS — E041 Nontoxic single thyroid nodule: Secondary | ICD-10-CM

## 2012-08-18 DIAGNOSIS — E7800 Pure hypercholesterolemia, unspecified: Secondary | ICD-10-CM

## 2012-08-18 DIAGNOSIS — I2510 Atherosclerotic heart disease of native coronary artery without angina pectoris: Secondary | ICD-10-CM

## 2012-08-18 DIAGNOSIS — I739 Peripheral vascular disease, unspecified: Secondary | ICD-10-CM

## 2012-08-18 DIAGNOSIS — I10 Essential (primary) hypertension: Secondary | ICD-10-CM

## 2012-08-21 ENCOUNTER — Encounter: Payer: Self-pay | Admitting: Internal Medicine

## 2012-08-21 NOTE — Assessment & Plan Note (Signed)
On vytorin.  Follow lipid profile and liver function.  Low cholesterol diet.       

## 2012-08-21 NOTE — Assessment & Plan Note (Signed)
Followed by Dr Tedd Sias. Biopsy negative.

## 2012-08-21 NOTE — Progress Notes (Signed)
Subjective:    [name], female    DOB: January 14, 1946, 67 y.o.   MRN: [id number]  HPI 67 year old female with past history of hypertension, hypercholesterolemia and peripheral vascular disease.  She comes in today for a scheduled follow up.  She was seen on 07/06/12 and diagnosed with pneumonia.  She was placed on Levaquin and prednisone.  On 07/13/12, she called secondary to persistent weakness, BRBPR with wiping and chest discomfort.  Went to ER and was admitted.  Found to be hypotensive and had a syncopal episode in the ER.  Noted to have some renal insufficiency which improved with hydration.  It was felt her syncope was due to dehydration and hypovolemia.  Her spironolactone was held.  With the hydration, she felt better and was discharged.  While in the hospital she had an ECHO that revealed EF 60-65%, impaired relaxation pattern of LV diastolic filling, mild concentric LVH.  Carotid ultrasound revealed no visualized definite atherosclerotic severe stenosis.  She reports she is feeling better.  Is eating and drinking.  No nausea or vomiting.  Bowels normal.  Walking on her treadmill now.  Walks for .  No problems with the exercise.  Blood pressure has been averaging 128-135/70-80.  Overall she feel she is doing well.    Past Medical History  Diagnosis Date  . Depression   . History of chicken pox   . Glaucoma   . GERD (gastroesophageal reflux disease)   . Hyperlipidemia   . Hypertension   . Peripheral vascular disease   . Diffuse cystic mastopathy 2013  . High cholesterol   . Pneumonia March 2014    Current Outpatient Prescriptions on File Prior to Visit  Medication Sig Dispense Refill  . aspirin (BAYER ASPIRIN) 325 MG tablet Take 325 mg by mouth daily.      [name] ezetimibe-simvastatin (VYTORIN) 10-20 MG per tablet Take 1 tablet by mouth daily.  30 tablet  5  . losartan-hydrochlorothiazide (HYZAAR) 100-25 MG per tablet Take 1 tablet by mouth daily.  90 tablet  1  .  metoprolol (LOPRESSOR) 50 MG tablet Take 1 tablet (50 mg total) by mouth 2 (two) times daily.  60 tablet  5  . vitamin E 400 UNIT capsule Take 400 Units by mouth daily.       No current facility-administered medications on file prior to visit.    Review of Systems Patient denies any headache, lightheadedness or dizziness.  No chest pain, tightness or palpitations. No increased shortness of breath, cough or congestion.  No acid reflux, dysphagia or odynophagia. No nausea or vomiting.  No abdominal pain or cramping.  No bowel change, such as diarrhea, constipation, or melana.   No reported blood noticed in the stool.  Exercising now.  No problems with exercise.  Overalls he feels she is doing well.      Objective:   Physical Exam  Filed Vitals:   08/18/12 1103  BP: 132/78  Pulse: 69  Temp: 97.6 F (36.4 C)  Resp: 18   Blood pressure recheck:  41-55/71  67 year old female in no acute distress.   HEENT:  Nares- clear.  Oropharynx - without lesions. NECK:  Supple.  Nontender.  No audible bruit.  HEART:  Appears to be regular. LUNGS:  No crackles or wheezing audible.  Respirations even and unlabored.  RADIAL PULSE:  Equal bilaterally.   ABDOMEN:  Soft, nontender.  Bowel sounds present and normal.  No audible abdominal bruit.    EXTREMITIES:  No increased edema present.  DP pulses palpable and equal bilaterally.             Assessment & Plan:  Cardiovascular. Just recently underwent cardiac evaluation back in the spring. Everything checked out fine. She is currently asymptomatic. Continue risk factor modification.  Had ECHO 12/04/11 - EF 60%, mild left ventricular hypertrophy and mild to moderate mitral and tricuspid insufficiency.  Had follow up ECHO in the hospital as outlined above.  Currently asymptomatic.  Follow.    PNEUMONIA.  Was treated.  No cough or congestion now.  CXR in acute care 07/06/12 revealed mild interstitial opacities in the mid and lower left lung.  She was also  noted to have a small nodular density overlying the mid thoracic spine on the lateral view.  Felt possibly to be related to the opacities.  A repeat cxr in the hospital (07/13/12) revealed no acute cardiopulmonary disease.  Currently asymptomatic.    RENAL INSUFFICIENCY.  Noted in the hospital.  Improved with hydration.  Cr just checked - 1.0.  Follow.  Stay hydrated.     Health maintenance.  Physical 05/31/12.   Mammograms through Dr Evette Cristal.  Last 07/22/12 - BiRADS II. Colonoscopy 2010.  Per Dr Evette Cristal - due 2015.

## 2012-08-21 NOTE — Assessment & Plan Note (Signed)
Sees Dr. Wyn Quaker.  He is status post stent placement. Legs are doing well. Continue risk factor modification.  Apparently Dr Wyn Quaker checked her carotids in the fall.  Obtain records.  Forwarded most recent carotid ultrasound to Dr Wyn Quaker for review.

## 2012-08-21 NOTE — Assessment & Plan Note (Signed)
Controlled.  

## 2012-08-21 NOTE — Assessment & Plan Note (Signed)
Stable.  Sees Dr Paraschos.  Asymptomatic.  Follow.    

## 2012-08-21 NOTE — Assessment & Plan Note (Signed)
Blood pressure as outlined.  Remain off spironolactone.  She will spot check her pressure.   Recent metabolic panel - ok.        

## 2012-09-21 ENCOUNTER — Telehealth: Payer: Self-pay | Admitting: *Deleted

## 2012-09-21 DIAGNOSIS — I10 Essential (primary) hypertension: Secondary | ICD-10-CM

## 2012-09-21 DIAGNOSIS — E7800 Pure hypercholesterolemia, unspecified: Secondary | ICD-10-CM

## 2012-09-21 DIAGNOSIS — R7989 Other specified abnormal findings of blood chemistry: Secondary | ICD-10-CM

## 2012-09-21 DIAGNOSIS — D72829 Elevated white blood cell count, unspecified: Secondary | ICD-10-CM

## 2012-09-21 NOTE — Telephone Encounter (Signed)
I placed order for labs.  Thanks.

## 2012-09-21 NOTE — Telephone Encounter (Signed)
Pt is coming in for labs tomorrow 05.15.2014 what labs and dx?  

## 2012-09-22 ENCOUNTER — Other Ambulatory Visit (INDEPENDENT_AMBULATORY_CARE_PROVIDER_SITE_OTHER): Payer: Medicare Other

## 2012-09-22 DIAGNOSIS — R946 Abnormal results of thyroid function studies: Secondary | ICD-10-CM

## 2012-09-22 DIAGNOSIS — E7800 Pure hypercholesterolemia, unspecified: Secondary | ICD-10-CM

## 2012-09-22 DIAGNOSIS — I10 Essential (primary) hypertension: Secondary | ICD-10-CM

## 2012-09-22 DIAGNOSIS — D72829 Elevated white blood cell count, unspecified: Secondary | ICD-10-CM

## 2012-09-22 DIAGNOSIS — R7989 Other specified abnormal findings of blood chemistry: Secondary | ICD-10-CM

## 2012-09-22 LAB — LIPID PANEL
Cholesterol: 193 mg/dL (ref 0–200)
LDL Cholesterol: 113 mg/dL — ABNORMAL HIGH (ref 0–99)

## 2012-09-22 LAB — CBC WITH DIFFERENTIAL/PLATELET
Basophils Relative: 0.5 % (ref 0.0–3.0)
Eosinophils Absolute: 0.1 10*3/uL (ref 0.0–0.7)
HCT: 38.8 % (ref 36.0–46.0)
Hemoglobin: 13.3 g/dL (ref 12.0–15.0)
Lymphocytes Relative: 33.2 % (ref 12.0–46.0)
Lymphs Abs: 2 10*3/uL (ref 0.7–4.0)
MCHC: 34.2 g/dL (ref 30.0–36.0)
MCV: 92.9 fl (ref 78.0–100.0)
Monocytes Absolute: 0.6 10*3/uL (ref 0.1–1.0)
Neutro Abs: 3.3 10*3/uL (ref 1.4–7.7)
RBC: 4.17 Mil/uL (ref 3.87–5.11)

## 2012-09-22 LAB — BASIC METABOLIC PANEL
BUN: 23 mg/dL (ref 6–23)
Chloride: 106 mEq/L (ref 96–112)
Creatinine, Ser: 1 mg/dL (ref 0.4–1.2)

## 2012-09-22 LAB — HEPATIC FUNCTION PANEL
Alkaline Phosphatase: 41 U/L (ref 39–117)
Bilirubin, Direct: 0.1 mg/dL (ref 0.0–0.3)
Total Protein: 7.4 g/dL (ref 6.0–8.3)

## 2012-09-28 ENCOUNTER — Encounter: Payer: Self-pay | Admitting: Internal Medicine

## 2012-09-28 ENCOUNTER — Ambulatory Visit (INDEPENDENT_AMBULATORY_CARE_PROVIDER_SITE_OTHER): Payer: Medicare Other | Admitting: Internal Medicine

## 2012-09-28 VITALS — BP 130/70 | HR 68 | Temp 98.5°F | Ht 65.0 in | Wt 190.5 lb

## 2012-09-28 DIAGNOSIS — I10 Essential (primary) hypertension: Secondary | ICD-10-CM

## 2012-09-28 DIAGNOSIS — I739 Peripheral vascular disease, unspecified: Secondary | ICD-10-CM

## 2012-09-28 DIAGNOSIS — E041 Nontoxic single thyroid nodule: Secondary | ICD-10-CM

## 2012-09-28 DIAGNOSIS — I2510 Atherosclerotic heart disease of native coronary artery without angina pectoris: Secondary | ICD-10-CM

## 2012-09-28 DIAGNOSIS — R197 Diarrhea, unspecified: Secondary | ICD-10-CM

## 2012-09-28 DIAGNOSIS — R945 Abnormal results of liver function studies: Secondary | ICD-10-CM

## 2012-09-28 DIAGNOSIS — K219 Gastro-esophageal reflux disease without esophagitis: Secondary | ICD-10-CM

## 2012-09-28 DIAGNOSIS — R7989 Other specified abnormal findings of blood chemistry: Secondary | ICD-10-CM

## 2012-09-28 DIAGNOSIS — E7800 Pure hypercholesterolemia, unspecified: Secondary | ICD-10-CM

## 2012-09-28 DIAGNOSIS — K769 Liver disease, unspecified: Secondary | ICD-10-CM

## 2012-09-28 LAB — HEPATIC FUNCTION PANEL
ALT: 27 U/L (ref 0–35)
Alkaline Phosphatase: 41 U/L (ref 39–117)
Bilirubin, Direct: 0 mg/dL (ref 0.0–0.3)
Total Protein: 7.4 g/dL (ref 6.0–8.3)

## 2012-09-29 ENCOUNTER — Ambulatory Visit (INDEPENDENT_AMBULATORY_CARE_PROVIDER_SITE_OTHER): Payer: Medicare Other | Admitting: Internal Medicine

## 2012-09-29 DIAGNOSIS — I10 Essential (primary) hypertension: Secondary | ICD-10-CM

## 2012-09-29 DIAGNOSIS — E7800 Pure hypercholesterolemia, unspecified: Secondary | ICD-10-CM

## 2012-10-02 ENCOUNTER — Encounter: Payer: Self-pay | Admitting: Internal Medicine

## 2012-10-02 NOTE — Assessment & Plan Note (Signed)
Sees Dr. Wyn Quaker.  She is status post stent placement. Legs are doing well. Continue risk factor modification.  Apparently Dr Wyn Quaker checked her carotids in the fall.  Obtain records.  Forwarded most recent carotid ultrasound to Dr Wyn Quaker for review.

## 2012-10-02 NOTE — Assessment & Plan Note (Signed)
Recheck liver panel today.  

## 2012-10-02 NOTE — Assessment & Plan Note (Signed)
Resolved

## 2012-10-02 NOTE — Assessment & Plan Note (Signed)
Stable.  Sees Dr Paraschos.  Asymptomatic.  Follow.    

## 2012-10-02 NOTE — Assessment & Plan Note (Signed)
Controlled.  

## 2012-10-02 NOTE — Progress Notes (Signed)
Subjective:    [name], female    DOB: Oct 15, 1945, 67 y.o.   MRN: [id number]  HPI 67 year old female with past history of hypertension, hypercholesterolemia and peripheral vascular disease.  She comes in today for a scheduled follow up.  States she is doing well.  No chest pain or tightness with increased activity or exertion.  Breathing stable.  No acid reflux.  Eating and drinking well.  No nausea or vomiting.  Overall she feels back to her baseline.      Past Medical History  Diagnosis Date  . Depression   . History of chicken pox   . Glaucoma   . GERD (gastroesophageal reflux disease)   . Hyperlipidemia   . Hypertension   . Peripheral vascular disease   . Diffuse cystic mastopathy 2013  . High cholesterol   . Pneumonia March 2014    Current Outpatient Prescriptions on File Prior to Visit  Medication Sig Dispense Refill  . aspirin (BAYER ASPIRIN) 325 MG tablet Take 325 mg by mouth daily.      [name] ezetimibe-simvastatin (VYTORIN) 10-20 MG per tablet Take 1 tablet by mouth daily.  30 tablet  5  . losartan-hydrochlorothiazide (HYZAAR) 100-25 MG per tablet Take 1 tablet by mouth daily.  90 tablet  1  . metoprolol (LOPRESSOR) 50 MG tablet Take 1 tablet (50 mg total) by mouth 2 (two) times daily.  60 tablet  5  . vitamin E 400 UNIT capsule Take 400 Units by mouth daily.       No current facility-administered medications on file prior to visit.    Review of Systems Patient denies any headache, lightheadedness or dizziness.  No chest pain, tightness or palpitations. No increased shortness of breath, cough or congestion.  No acid reflux, dysphagia or odynophagia. No nausea or vomiting.  No abdominal pain or cramping.  No bowel change, such as diarrhea, constipation, or melana.  Exercising now.  No problems with exercise.  Overalls he feels she is doing well.      Objective:   Physical Exam  Filed Vitals:   09/28/12 1357  BP: 130/70  Pulse: 68  Temp: 98.5 F (55.53  C)   67 year old female in no acute distress.   HEENT:  Nares- clear.  Oropharynx - without lesions. NECK:  Supple.  Nontender.  No audible bruit.  HEART:  Appears to be regular. LUNGS:  No crackles or wheezing audible.  Respirations even and unlabored.  RADIAL PULSE:  Equal bilaterally.   ABDOMEN:  Soft, nontender.  Bowel sounds present and normal.  No audible abdominal bruit.    EXTREMITIES:  No increased edema present.  DP pulses palpable and equal bilaterally.  MINI MENTAL STATUS EXAM:  Oriented to person, place or time.  Able to spell world backwards.  New the president.  Able to subtract serial 7s.  Able to recall 2 of 3 objects after 5 minutes.              Assessment & Plan:  Cardiovascular. Just recently underwent cardiac evaluation back in the spring. Everything checked out fine. She is currently asymptomatic. Continue risk factor modification.  Had ECHO 12/04/11 - EF 60%, mild left ventricular hypertrophy and mild to moderate mitral and tricuspid insufficiency.  Had follow up ECHO in the hospital as outlined in last note.  Currently asymptomatic.  Follow.    PNEUMONIA.  Was treated.  No cough or congestion now.  CXR in acute care 07/06/12 revealed mild interstitial opacities  in the mid and lower left lung.  She was also noted to have a small nodular density overlying the mid thoracic spine on the lateral view.  Felt possibly to be related to the opacities.  A repeat cxr in the hospital (07/13/12) revealed no acute cardiopulmonary disease.  Currently asymptomatic.    RENAL INSUFFICIENCY.  Noted in the hospital.  Improved with hydration.  Cr just checked - 1.0.  Follow.  Stay hydrated.     Health maintenance.  Physical 05/31/12.   Mammograms through Dr Evette Cristal.  Last 07/22/12 - BiRADS II. Colonoscopy 2010.  Per Dr Evette Cristal - due 2015.

## 2012-10-02 NOTE — Assessment & Plan Note (Signed)
Blood pressure as outlined.  Remain off spironolactone.  She will spot check her pressure.   Recent metabolic panel - ok.

## 2012-10-02 NOTE — Assessment & Plan Note (Signed)
On vytorin.  Follow lipid profile and liver function.  Low cholesterol diet.

## 2012-10-02 NOTE — Assessment & Plan Note (Signed)
Followed by Dr Solum. Biopsy negative.    

## 2012-10-03 NOTE — Progress Notes (Signed)
Pt not seen on 09/29/12.  Pt was seen on 09/28/12.  Opened in error

## 2012-10-18 ENCOUNTER — Ambulatory Visit: Payer: Medicare Other | Admitting: Internal Medicine

## 2012-11-25 ENCOUNTER — Other Ambulatory Visit: Payer: Self-pay | Admitting: *Deleted

## 2012-11-25 MED ORDER — LOSARTAN POTASSIUM-HCTZ 100-25 MG PO TABS: 1.0000 | ORAL_TABLET | Freq: Every day | ORAL | Status: DC

## 2013-01-31 ENCOUNTER — Other Ambulatory Visit: Payer: Medicare Other

## 2013-02-01 ENCOUNTER — Ambulatory Visit: Payer: Medicare Other | Admitting: Internal Medicine

## 2013-02-06 ENCOUNTER — Other Ambulatory Visit: Payer: Medicare Other

## 2013-02-07 ENCOUNTER — Other Ambulatory Visit (INDEPENDENT_AMBULATORY_CARE_PROVIDER_SITE_OTHER): Payer: Medicare Other

## 2013-02-07 DIAGNOSIS — E7800 Pure hypercholesterolemia, unspecified: Secondary | ICD-10-CM

## 2013-02-07 DIAGNOSIS — I10 Essential (primary) hypertension: Secondary | ICD-10-CM

## 2013-02-07 LAB — HEPATIC FUNCTION PANEL
Alkaline Phosphatase: 41 U/L (ref 39–117)
Bilirubin, Direct: 0 mg/dL (ref 0.0–0.3)
Total Protein: 7.3 g/dL (ref 6.0–8.3)

## 2013-02-07 LAB — BASIC METABOLIC PANEL
BUN: 18 mg/dL (ref 6–23)
Chloride: 108 mEq/L (ref 96–112)
Creatinine, Ser: 1 mg/dL (ref 0.4–1.2)

## 2013-02-07 LAB — LIPID PANEL
Cholesterol: 187 mg/dL (ref 0–200)
LDL Cholesterol: 107 mg/dL — ABNORMAL HIGH (ref 0–99)
Total CHOL/HDL Ratio: 4

## 2013-02-08 ENCOUNTER — Ambulatory Visit (INDEPENDENT_AMBULATORY_CARE_PROVIDER_SITE_OTHER): Payer: Medicare Other | Admitting: Internal Medicine

## 2013-02-08 ENCOUNTER — Encounter: Payer: Self-pay | Admitting: Internal Medicine

## 2013-02-08 VITALS — BP 130/70 | HR 61 | Temp 98.5°F | Ht 65.0 in | Wt 188.0 lb

## 2013-02-08 DIAGNOSIS — R7309 Other abnormal glucose: Secondary | ICD-10-CM

## 2013-02-08 DIAGNOSIS — I10 Essential (primary) hypertension: Secondary | ICD-10-CM

## 2013-02-08 DIAGNOSIS — I739 Peripheral vascular disease, unspecified: Secondary | ICD-10-CM

## 2013-02-08 DIAGNOSIS — R7989 Other specified abnormal findings of blood chemistry: Secondary | ICD-10-CM

## 2013-02-08 DIAGNOSIS — E7800 Pure hypercholesterolemia, unspecified: Secondary | ICD-10-CM

## 2013-02-08 DIAGNOSIS — R739 Hyperglycemia, unspecified: Secondary | ICD-10-CM

## 2013-02-08 DIAGNOSIS — R945 Abnormal results of liver function studies: Secondary | ICD-10-CM

## 2013-02-08 DIAGNOSIS — K219 Gastro-esophageal reflux disease without esophagitis: Secondary | ICD-10-CM

## 2013-02-08 DIAGNOSIS — E041 Nontoxic single thyroid nodule: Secondary | ICD-10-CM

## 2013-02-08 DIAGNOSIS — I2510 Atherosclerotic heart disease of native coronary artery without angina pectoris: Secondary | ICD-10-CM

## 2013-02-08 MED ORDER — EZETIMIBE-SIMVASTATIN 10-20 MG PO TABS: 1.0000 | ORAL_TABLET | Freq: Every day | ORAL | Status: DC

## 2013-02-08 NOTE — Assessment & Plan Note (Addendum)
Was followed by Dr Solum. Biopsy negative.  States has been released.    

## 2013-02-08 NOTE — Assessment & Plan Note (Addendum)
Sees Dr. Wyn Quaker.  She is status post stent placement. Legs are doing well. Continue risk factor modification.  Dr Wyn Quaker also following carotid ultrasounds.  Due to see him this fall.

## 2013-02-08 NOTE — Progress Notes (Signed)
  Subjective:    [name], female    DOB: [birth year], 67 y.o.   MRN: [id number]  HPI 67 year old female with past history of hypertension, hypercholesterolemia and peripheral vascular disease.  She comes in today for a scheduled follow up.  States she is doing well.  No chest pain or tightness with increased activity or exertion.  Breathing stable.  No acid reflux.  Eating and drinking well.  No nausea or vomiting.  Overall feels good.    Past Medical History  Diagnosis Date  . Depression   . History of chicken pox   . Glaucoma   . GERD (gastroesophageal reflux disease)   . Hyperlipidemia   . Hypertension   . Peripheral vascular disease   . Diffuse cystic mastopathy 2013  . High cholesterol   . Pneumonia March 2014    Current Outpatient Prescriptions on File Prior to Visit  Medication Sig Dispense Refill  . aspirin (BAYER ASPIRIN) 325 MG tablet Take 325 mg by mouth daily.      [name] ezetimibe-simvastatin (VYTORIN) 10-20 MG per tablet Take 1 tablet by mouth daily.  30 tablet  5  . losartan-hydrochlorothiazide (HYZAAR) 100-25 MG per tablet Take 1 tablet by mouth daily.  90 tablet  1  . metoprolol (LOPRESSOR) 50 MG tablet Take 1 tablet (50 mg total) by mouth 2 (two) times daily.  60 tablet  5  . vitamin E 400 UNIT capsule Take 400 Units by mouth daily.       No current facility-administered medications on file prior to visit.    Review of Systems Patient denies any headache, lightheadedness or dizziness.  No chest pain, tightness or palpitations. No increased shortness of breath, cough or congestion.  No acid reflux, dysphagia or odynophagia. No nausea or vomiting.  No abdominal pain or cramping.  No bowel change, such as diarrhea, constipation, or melana.   Overalls he feels she is doing well.      Objective:   Physical Exam  Filed Vitals:   02/08/13 0931  BP: 130/70  Pulse: 61  Temp: 98.5 F (43.94 C)   67 year old female in no acute distress.   HEENT:  Nares-  clear.  Oropharynx - without lesions. NECK:  Supple.  Nontender.  No audible bruit.  HEART:  Appears to be regular. LUNGS:  No crackles or wheezing audible.  Respirations even and unlabored.  RADIAL PULSE:  Equal bilaterally.   ABDOMEN:  Soft, nontender.  Bowel sounds present and normal.  No audible abdominal bruit.    EXTREMITIES:  No increased edema present.  DP pulses palpable and equal bilaterally.             Assessment & Plan:  Cardiovascular. Just recently underwent cardiac evaluation back in the spring. Everything checked out fine. She is currently asymptomatic. Continue risk factor modification.  Had ECHO 12/04/11 - EF 60%, mild left ventricular hypertrophy and mild to moderate mitral and tricuspid insufficiency.  Had follow up ECHO in the hospital as outlined in previous note.  Currently asymptomatic.  Follow.    PNEUMONIA.   A repeat cxr in the hospital (07/13/12) revealed no acute cardiopulmonary disease.  Currently asymptomatic.    RENAL INSUFFICIENCY.  Noted in the hospital.  Improved with hydration.  Cr just checked - 1.0.  Stable.     Health maintenance.  Physical 05/31/12.   Mammograms through Dr Evette Cristal.  Last 07/22/12 - BiRADS II. Colonoscopy 2010.  Per Dr Evette Cristal - due 2015.

## 2013-02-09 ENCOUNTER — Encounter: Payer: Self-pay | Admitting: Internal Medicine

## 2013-02-09 DIAGNOSIS — R739 Hyperglycemia, unspecified: Secondary | ICD-10-CM | POA: Insufficient documentation

## 2013-02-09 NOTE — Assessment & Plan Note (Signed)
Low carb diet and exercise.  Will follow.  Check a1c with next labs.

## 2013-02-09 NOTE — Assessment & Plan Note (Signed)
Stable.  Sees Dr Paraschos.  Asymptomatic.  Follow.    

## 2013-02-09 NOTE — Assessment & Plan Note (Addendum)
On vytorin.  Follow lipid profile and liver function.  Low cholesterol diet.  Triglycerides continue to improve.    

## 2013-02-09 NOTE — Assessment & Plan Note (Signed)
Controlled.  

## 2013-02-09 NOTE — Assessment & Plan Note (Signed)
Recent liver panel wnl.  Follow.  

## 2013-02-09 NOTE — Assessment & Plan Note (Signed)
Blood pressure doing well.  Same medication regimen.  Follow metabolic panel.  Cr recently checked - 1.0.    

## 2013-06-05 ENCOUNTER — Other Ambulatory Visit: Payer: Self-pay | Admitting: Internal Medicine

## 2013-06-13 ENCOUNTER — Other Ambulatory Visit (INDEPENDENT_AMBULATORY_CARE_PROVIDER_SITE_OTHER): Payer: Medicare Other

## 2013-06-13 DIAGNOSIS — R7989 Other specified abnormal findings of blood chemistry: Secondary | ICD-10-CM

## 2013-06-13 DIAGNOSIS — R945 Abnormal results of liver function studies: Secondary | ICD-10-CM

## 2013-06-13 DIAGNOSIS — R739 Hyperglycemia, unspecified: Secondary | ICD-10-CM

## 2013-06-13 DIAGNOSIS — E7800 Pure hypercholesterolemia, unspecified: Secondary | ICD-10-CM

## 2013-06-13 DIAGNOSIS — R7309 Other abnormal glucose: Secondary | ICD-10-CM

## 2013-06-13 DIAGNOSIS — I10 Essential (primary) hypertension: Secondary | ICD-10-CM

## 2013-06-13 LAB — HEPATIC FUNCTION PANEL
ALBUMIN: 4.1 g/dL (ref 3.5–5.2)
ALK PHOS: 47 U/L (ref 39–117)
ALT: 23 U/L (ref 0–35)
AST: 18 U/L (ref 0–37)
Bilirubin, Direct: 0.1 mg/dL (ref 0.0–0.3)
TOTAL PROTEIN: 7.3 g/dL (ref 6.0–8.3)
Total Bilirubin: 0.9 mg/dL (ref 0.3–1.2)

## 2013-06-13 LAB — BASIC METABOLIC PANEL
BUN: 15 mg/dL (ref 6–23)
CO2: 27 mEq/L (ref 19–32)
Calcium: 9.4 mg/dL (ref 8.4–10.5)
Chloride: 107 mEq/L (ref 96–112)
Creatinine, Ser: 1 mg/dL (ref 0.4–1.2)
GFR: 62.02 mL/min (ref >60.00)
Glucose, Bld: 99 mg/dL (ref 70–99)
Potassium: 4.2 mEq/L (ref 3.5–5.1)
SODIUM: 140 meq/L (ref 135–145)

## 2013-06-13 LAB — HEMOGLOBIN A1C: Hgb A1c MFr Bld: 6.1 % (ref 4.6–6.5)

## 2013-06-13 LAB — LIPID PANEL
CHOLESTEROL: 202 mg/dL — AB (ref 0–200)
HDL: 47.2 mg/dL (ref >39.00)
Total CHOL/HDL Ratio: 4
Triglycerides: 163 mg/dL — ABNORMAL HIGH (ref 0.0–149.0)
VLDL: 32.6 mg/dL (ref 0.0–40.0)

## 2013-06-13 LAB — LDL CHOLESTEROL, DIRECT: Direct LDL: 132.1 mg/dL

## 2013-06-20 ENCOUNTER — Ambulatory Visit (INDEPENDENT_AMBULATORY_CARE_PROVIDER_SITE_OTHER): Payer: Medicare Other | Admitting: Internal Medicine

## 2013-06-20 ENCOUNTER — Encounter: Payer: Self-pay | Admitting: Internal Medicine

## 2013-06-20 VITALS — BP 130/80 | HR 63 | Temp 98.0°F | Ht 64.9 in | Wt 189.5 lb

## 2013-06-20 DIAGNOSIS — R739 Hyperglycemia, unspecified: Secondary | ICD-10-CM

## 2013-06-20 DIAGNOSIS — E041 Nontoxic single thyroid nodule: Secondary | ICD-10-CM

## 2013-06-20 DIAGNOSIS — I10 Essential (primary) hypertension: Secondary | ICD-10-CM

## 2013-06-20 DIAGNOSIS — Z1211 Encounter for screening for malignant neoplasm of colon: Secondary | ICD-10-CM

## 2013-06-20 DIAGNOSIS — I739 Peripheral vascular disease, unspecified: Secondary | ICD-10-CM

## 2013-06-20 DIAGNOSIS — K219 Gastro-esophageal reflux disease without esophagitis: Secondary | ICD-10-CM

## 2013-06-20 DIAGNOSIS — Z139 Encounter for screening, unspecified: Secondary | ICD-10-CM

## 2013-06-20 DIAGNOSIS — R7309 Other abnormal glucose: Secondary | ICD-10-CM

## 2013-06-20 DIAGNOSIS — I2510 Atherosclerotic heart disease of native coronary artery without angina pectoris: Secondary | ICD-10-CM

## 2013-06-20 DIAGNOSIS — R945 Abnormal results of liver function studies: Secondary | ICD-10-CM

## 2013-06-20 DIAGNOSIS — E7800 Pure hypercholesterolemia, unspecified: Secondary | ICD-10-CM

## 2013-06-20 DIAGNOSIS — R7989 Other specified abnormal findings of blood chemistry: Secondary | ICD-10-CM

## 2013-06-20 NOTE — Progress Notes (Signed)
Pre-visit discussion using our clinic review tool. No additional management support is needed unless otherwise documented below in the visit note.  

## 2013-06-25 ENCOUNTER — Encounter: Payer: Self-pay | Admitting: Internal Medicine

## 2013-06-25 NOTE — Progress Notes (Signed)
Subjective:    [name], female    DOB: 1946-01-14, 68 y.o.   MRN: [id number]  HPI 68 year old female with past history of hypertension, hypercholesterolemia and peripheral vascular disease.  She comes in today to follow up on these issues as well as for a complete physical exam.   States she is doing well.  No chest pain or tightness with increased activity or exertion.  Breathing stable.  No acid reflux.  Eating and drinking well.  No nausea or vomiting.  Overall feels good.     Past Medical History  Diagnosis Date  . Depression   . History of chicken pox   . Glaucoma   . GERD (gastroesophageal reflux disease)   . Hyperlipidemia   . Hypertension   . Peripheral vascular disease   . Diffuse cystic mastopathy 2013  . High cholesterol   . Pneumonia March 2014    Current Outpatient Prescriptions on File Prior to Visit  Medication Sig Dispense Refill  . aspirin (BAYER ASPIRIN) 325 MG tablet Take 325 mg by mouth daily.      [name] ezetimibe-simvastatin (VYTORIN) 10-20 MG per tablet Take 1 tablet by mouth daily.  30 tablet  5  . losartan-hydrochlorothiazide (HYZAAR) 100-25 MG per tablet TAKE 1 TABLET BY MOUTH DAILY.  90 tablet  1  . metoprolol (LOPRESSOR) 50 MG tablet Take 1 tablet (50 mg total) by mouth 2 (two) times daily.  60 tablet  5  . vitamin E 400 UNIT capsule Take 400 Units by mouth daily.       No current facility-administered medications on file prior to visit.    Review of Systems Patient denies any headache, lightheadedness or dizziness.  No sinus or allergy symptoms. No chest pain, tightness or palpitations. No increased shortness of breath, cough or congestion.  No acid reflux, dysphagia or odynophagia. No nausea or vomiting.  No abdominal pain or cramping.  No bowel change, such as diarrhea, constipation, or melana.   Overall she feels she is doing well.      Objective:   Physical Exam  Filed Vitals:   06/20/13 1336  BP: 130/80  Pulse: 63  Temp: 98 F  (61.76 C)   68 year old female in no acute distress.   HEENT:  Nares- clear.  Oropharynx - without lesions. NECK:  Supple.  Nontender.  No audible bruit.  HEART:  Appears to be regular. LUNGS:  No crackles or wheezing audible.  Respirations even and unlabored.  RADIAL PULSE:  Equal bilaterally.    BREASTS:  No nipple discharge or nipple retraction present.  Could not appreciate any distinct nodules or axillary adenopathy.  ABDOMEN:  Soft, nontender.  Bowel sounds present and normal.  No audible abdominal bruit.  GU:  Not performed.     EXTREMITIES:  No increased edema present.  DP pulses palpable and equal bilaterally.           Assessment & Plan:  Cardiovascular. Just recently underwent cardiac evaluation back in the spring. Everything checked out fine. She is currently asymptomatic. Continue risk factor modification.  Had ECHO 12/04/11 - EF 60%, mild left ventricular hypertrophy and mild to moderate mitral and tricuspid insufficiency.  Had follow up ECHO in the hospital as outlined in previous note.  Currently asymptomatic.  Follow.    RENAL INSUFFICIENCY.  Noted in the hospital.  Improved with hydration.  Cr just checked - 1.0.  Stable.     HEALTH MAINTENANCE.  Physical today.   Mammograms through  Dr Jamal Collin.  Last 07/22/12 - BiRADS II. Colonoscopy 2010.  Per Dr Jamal Collin - due 2015.

## 2013-06-25 NOTE — Assessment & Plan Note (Signed)
Sees Dr. Dew.  She is status post stent placement. Legs are doing well. Continue risk factor modification.  Dr Dew also following carotid ultrasounds.    

## 2013-06-25 NOTE — Assessment & Plan Note (Signed)
Controlled.  

## 2013-06-25 NOTE — Assessment & Plan Note (Signed)
Was followed by Dr Solum. Biopsy negative.  States has been released.    

## 2013-06-25 NOTE — Assessment & Plan Note (Signed)
Blood pressure doing well.  Same medication regimen.  Follow metabolic panel.  Cr recently checked - 1.0.

## 2013-06-25 NOTE — Assessment & Plan Note (Signed)
On vytorin.  Follow lipid profile and liver function.  Low cholesterol diet.  Triglycerides continue to improve.

## 2013-06-25 NOTE — Assessment & Plan Note (Signed)
Stable.  Sees Dr Saralyn Pilar.  Asymptomatic.  Follow.

## 2013-06-25 NOTE — Assessment & Plan Note (Signed)
Low carb diet and exercise.  Will follow.  A1c recently checked 6.1.

## 2013-06-25 NOTE — Assessment & Plan Note (Signed)
Recent liver panel wnl.  Follow.  

## 2013-07-01 ENCOUNTER — Other Ambulatory Visit: Payer: Self-pay | Admitting: Internal Medicine

## 2013-07-07 ENCOUNTER — Other Ambulatory Visit: Payer: Self-pay | Admitting: Internal Medicine

## 2013-07-27 ENCOUNTER — Ambulatory Visit: Payer: Self-pay | Admitting: General Surgery

## 2013-07-30 ENCOUNTER — Encounter: Payer: Self-pay | Admitting: General Surgery

## 2013-08-01 ENCOUNTER — Ambulatory Visit (INDEPENDENT_AMBULATORY_CARE_PROVIDER_SITE_OTHER): Payer: Medicare Other | Admitting: General Surgery

## 2013-08-01 ENCOUNTER — Telehealth: Payer: Self-pay | Admitting: *Deleted

## 2013-08-01 ENCOUNTER — Encounter: Payer: Self-pay | Admitting: General Surgery

## 2013-08-01 VITALS — BP 128/72 | HR 76 | Resp 14 | Ht 65.5 in | Wt 192.0 lb

## 2013-08-01 DIAGNOSIS — N6019 Diffuse cystic mastopathy of unspecified breast: Secondary | ICD-10-CM

## 2013-08-01 NOTE — Progress Notes (Signed)
[name], female   DOB: 1945/05/20, 68 y.o.   MRN: [id number]  Chief Complaint  Patient presents with  . Follow-up    1 year screening mammogram     HPI Lindsey Bentley is a 68 y.o. female who presents for a breast evaluation. The most recent mammogram was done on 07/27/13. Patient does perform regular self breast checks and gets regular mammograms done.  The patient denies any new problems with the breasts at this time.    HPI  Past Medical History  Diagnosis Date  . Depression   . History of chicken pox   . Glaucoma   . GERD (gastroesophageal reflux disease)   . Hyperlipidemia   . Hypertension   . Peripheral vascular disease   . Diffuse cystic mastopathy 2013  . High cholesterol   . Pneumonia March 2014    Past Surgical History  Procedure Laterality Date  . Cholecystectomy  2006  . Breast biopsy  2008  . Leg stent  2011  . Back surgery  1986    ruptured disc  . Dilation and curettage of uterus    . Colonoscopy  2010    Dr. Jamal Collin  . Mole removal  2013    15 removed    Family History  Problem Relation Age of Onset  . Cancer Father     Prostate  . Hyperlipidemia Other     Parent  . Miscarriages / Stillbirths Other     Parent  . Hypertension Other     parent  . Heart disease Other     Parent  . Breast cancer Maternal Aunt     Social History History  Substance Use Topics  . Smoking status: Never Smoker   . Smokeless tobacco: Never Used  . Alcohol Use: No    Allergies  Allergen Reactions  . Codeine Nausea And Vomiting    Current Outpatient Prescriptions  Medication Sig Dispense Refill  . aspirin (BAYER ASPIRIN) 325 MG tablet Take 325 mg by mouth daily.      [name] ezetimibe-simvastatin (VYTORIN) 10-20 MG per tablet Take 1 tablet by mouth daily.  30 tablet  5  . losartan-hydrochlorothiazide (HYZAAR) 100-25 MG per tablet TAKE 1 TABLET BY MOUTH DAILY.  90 tablet  1  . metoprolol (LOPRESSOR) 50 MG tablet TAKE 1 TABLET (50 MG TOTAL) BY MOUTH 2  (TWO) TIMES DAILY.  60 tablet  5  . vitamin E 400 UNIT capsule Take 400 Units by mouth daily.       No current facility-administered medications for this visit.    Review of Systems Review of Systems  Constitutional: Negative.   Respiratory: Negative.   Cardiovascular: Negative.     Blood pressure 128/72, pulse 76, resp. rate 14, height 5' 5.5" (1.664 m), weight 192 lb (87.091 kg).  Physical Exam Physical Exam  Constitutional: She is oriented to person, place, and time. She appears well-developed and well-nourished.  Eyes: Conjunctivae are normal. No scleral icterus.  Neck: Neck supple. No thyromegaly present.  Cardiovascular: Normal rate, regular rhythm and normal heart sounds.   Pulmonary/Chest: Effort normal and breath sounds normal. Right breast exhibits no inverted nipple, no mass, no nipple discharge, no skin change and no tenderness. Left breast exhibits no inverted nipple, no mass, no nipple discharge, no skin change and no tenderness. Breasts are symmetrical.  Lymphadenopathy:    She has no cervical adenopathy.    She has no axillary adenopathy.  Neurological: She is alert and oriented to person, place,  and time.  Skin: Skin is warm and dry.    Data Reviewed Mammogram reviewed.   Assessment    Stable exam. Last colonoscopy-2010 was normal. She does not need another one till 2020.    Plan    35yr f/u with bilateral screening mammogram.       SANKAR,SEEPLAPUTHUR G 08/01/2013, 3:44 PM

## 2013-08-01 NOTE — Patient Instructions (Addendum)
Patient to return in 1 year with bilateral screening mammogram. The patient is aware to call back for any questions or concerns. Continue self breast exams. Call office for any new breast issues or concerns.

## 2013-08-01 NOTE — Telephone Encounter (Signed)
Patient states she was told to ask her sister what type of polyps that she had removed. This patient called in to say that her sister told her that they were pre-cancerous polyps and that is all she knows.   We currently have the patient in the recalls for breast follow up for 07-2014.

## 2013-08-07 ENCOUNTER — Other Ambulatory Visit: Payer: Self-pay | Admitting: Internal Medicine

## 2013-10-17 ENCOUNTER — Other Ambulatory Visit (INDEPENDENT_AMBULATORY_CARE_PROVIDER_SITE_OTHER): Payer: Medicare Other

## 2013-10-17 DIAGNOSIS — E7800 Pure hypercholesterolemia, unspecified: Secondary | ICD-10-CM

## 2013-10-17 DIAGNOSIS — I739 Peripheral vascular disease, unspecified: Secondary | ICD-10-CM

## 2013-10-17 DIAGNOSIS — Z139 Encounter for screening, unspecified: Secondary | ICD-10-CM

## 2013-10-17 DIAGNOSIS — I10 Essential (primary) hypertension: Secondary | ICD-10-CM

## 2013-10-17 DIAGNOSIS — E041 Nontoxic single thyroid nodule: Secondary | ICD-10-CM

## 2013-10-17 LAB — CBC WITH DIFFERENTIAL/PLATELET
BASOS ABS: 0 10*3/uL (ref 0.0–0.1)
Basophils Relative: 0.5 % (ref 0.0–3.0)
EOS ABS: 0.1 10*3/uL (ref 0.0–0.7)
Eosinophils Relative: 2.1 % (ref 0.0–5.0)
HEMATOCRIT: 38.4 % (ref 36.0–46.0)
Hemoglobin: 13 g/dL (ref 12.0–15.0)
Lymphocytes Relative: 38.5 % (ref 12.0–46.0)
Lymphs Abs: 2.4 10*3/uL (ref 0.7–4.0)
MCHC: 33.9 g/dL (ref 30.0–36.0)
MCV: 92.9 fl (ref 78.0–100.0)
MONO ABS: 0.5 10*3/uL (ref 0.1–1.0)
MONOS PCT: 7.4 % (ref 3.0–12.0)
NEUTROS ABS: 3.2 10*3/uL (ref 1.4–7.7)
Neutrophils Relative %: 51.5 % (ref 43.0–77.0)
PLATELETS: 222 10*3/uL (ref 150.0–400.0)
RBC: 4.13 Mil/uL (ref 3.87–5.11)
RDW: 13.1 % (ref 11.5–15.5)
WBC: 6.2 10*3/uL (ref 4.0–10.5)

## 2013-10-17 LAB — HEPATIC FUNCTION PANEL
ALT: 24 U/L (ref 0–35)
AST: 20 U/L (ref 0–37)
Albumin: 3.9 g/dL (ref 3.5–5.2)
Alkaline Phosphatase: 41 U/L (ref 39–117)
BILIRUBIN DIRECT: 0 mg/dL (ref 0.0–0.3)
Total Bilirubin: 0.7 mg/dL (ref 0.2–1.2)
Total Protein: 7 g/dL (ref 6.0–8.3)

## 2013-10-17 LAB — BASIC METABOLIC PANEL
BUN: 17 mg/dL (ref 6–23)
CHLORIDE: 108 meq/L (ref 96–112)
CO2: 26 meq/L (ref 19–32)
Calcium: 9.3 mg/dL (ref 8.4–10.5)
Creatinine, Ser: 1 mg/dL (ref 0.4–1.2)
GFR: 59.08 mL/min — ABNORMAL LOW (ref >60.00)
Glucose, Bld: 114 mg/dL — ABNORMAL HIGH (ref 70–99)
Potassium: 4.2 mEq/L (ref 3.5–5.1)
Sodium: 141 mEq/L (ref 135–145)

## 2013-10-17 LAB — LIPID PANEL
CHOL/HDL RATIO: 4
Cholesterol: 166 mg/dL (ref 0–200)
HDL: 42.1 mg/dL (ref >39.00)
LDL Cholesterol: 99 mg/dL (ref 0–99)
NONHDL: 123.9
Triglycerides: 127 mg/dL (ref 0.0–149.0)
VLDL: 25.4 mg/dL (ref 0.0–40.0)

## 2013-10-17 LAB — TSH: TSH: 4.45 u[IU]/mL (ref 0.35–4.50)

## 2013-10-18 LAB — VARICELLA ZOSTER ANTIBODY, IGG: Varicella IgG: >4000 Index — ABNORMAL HIGH (ref <135.00)

## 2013-10-20 ENCOUNTER — Ambulatory Visit: Payer: Medicare Other | Admitting: Internal Medicine

## 2013-10-23 ENCOUNTER — Encounter (INDEPENDENT_AMBULATORY_CARE_PROVIDER_SITE_OTHER): Payer: Self-pay

## 2013-10-23 ENCOUNTER — Ambulatory Visit (INDEPENDENT_AMBULATORY_CARE_PROVIDER_SITE_OTHER): Payer: Medicare Other | Admitting: Internal Medicine

## 2013-10-23 ENCOUNTER — Emergency Department: Payer: Self-pay | Admitting: Emergency Medicine

## 2013-10-23 ENCOUNTER — Encounter: Payer: Self-pay | Admitting: Internal Medicine

## 2013-10-23 VITALS — BP 110/70 | HR 62 | Temp 98.6°F | Ht 65.5 in | Wt 192.5 lb

## 2013-10-23 DIAGNOSIS — I209 Angina pectoris, unspecified: Secondary | ICD-10-CM

## 2013-10-23 DIAGNOSIS — R945 Abnormal results of liver function studies: Secondary | ICD-10-CM

## 2013-10-23 DIAGNOSIS — K219 Gastro-esophageal reflux disease without esophagitis: Secondary | ICD-10-CM

## 2013-10-23 DIAGNOSIS — E7800 Pure hypercholesterolemia, unspecified: Secondary | ICD-10-CM

## 2013-10-23 DIAGNOSIS — I25119 Atherosclerotic heart disease of native coronary artery with unspecified angina pectoris: Secondary | ICD-10-CM

## 2013-10-23 DIAGNOSIS — I739 Peripheral vascular disease, unspecified: Secondary | ICD-10-CM

## 2013-10-23 DIAGNOSIS — E041 Nontoxic single thyroid nodule: Secondary | ICD-10-CM

## 2013-10-23 DIAGNOSIS — R7989 Other specified abnormal findings of blood chemistry: Secondary | ICD-10-CM

## 2013-10-23 DIAGNOSIS — I2510 Atherosclerotic heart disease of native coronary artery without angina pectoris: Secondary | ICD-10-CM

## 2013-10-23 DIAGNOSIS — R079 Chest pain, unspecified: Secondary | ICD-10-CM

## 2013-10-23 DIAGNOSIS — R42 Dizziness and giddiness: Secondary | ICD-10-CM

## 2013-10-23 DIAGNOSIS — I10 Essential (primary) hypertension: Secondary | ICD-10-CM

## 2013-10-23 DIAGNOSIS — R7309 Other abnormal glucose: Secondary | ICD-10-CM

## 2013-10-23 DIAGNOSIS — R739 Hyperglycemia, unspecified: Secondary | ICD-10-CM

## 2013-10-23 LAB — CBC WITH DIFFERENTIAL/PLATELET
BASOS ABS: 0 10*3/uL (ref 0.0–0.1)
Basophil %: 0.6 %
Eosinophil #: 0.1 10*3/uL (ref 0.0–0.7)
Eosinophil %: 1.9 %
HCT: 40.1 % (ref 35.0–47.0)
HGB: 13.7 g/dL (ref 12.0–16.0)
Lymphocyte #: 2.5 10*3/uL (ref 1.0–3.6)
Lymphocyte %: 33.9 %
MCH: 32 pg (ref 26.0–34.0)
MCHC: 34.2 g/dL (ref 32.0–36.0)
MCV: 94 fL (ref 80–100)
MONOS PCT: 7.7 %
Monocyte #: 0.6 x10 3/mm (ref 0.2–0.9)
Neutrophil #: 4.1 10*3/uL (ref 1.4–6.5)
Neutrophil %: 55.9 %
PLATELETS: 214 10*3/uL (ref 150–440)
RBC: 4.28 10*6/uL (ref 3.80–5.20)
RDW: 13.2 % (ref 11.5–14.5)
WBC: 7.3 10*3/uL (ref 3.6–11.0)

## 2013-10-23 LAB — URINALYSIS, COMPLETE
Bacteria: NONE SEEN
Bilirubin,UR: NEGATIVE
GLUCOSE, UR: NEGATIVE mg/dL (ref 0–75)
KETONE: NEGATIVE
LEUKOCYTE ESTERASE: NEGATIVE
Nitrite: NEGATIVE
PH: 6 (ref 4.5–8.0)
Protein: NEGATIVE
RBC, UR: NONE SEEN /HPF (ref 0–5)
Specific Gravity: 1.006 (ref 1.003–1.030)
Squamous Epithelial: NONE SEEN

## 2013-10-23 LAB — BASIC METABOLIC PANEL
Anion Gap: 6 — ABNORMAL LOW (ref 7–16)
BUN: 17 mg/dL (ref 7–18)
CREATININE: 0.87 mg/dL (ref 0.60–1.30)
Calcium, Total: 9.3 mg/dL (ref 8.5–10.1)
Chloride: 104 mmol/L (ref 98–107)
Co2: 28 mmol/L (ref 21–32)
EGFR (Non-African Amer.): >60
Glucose: 96 mg/dL (ref 65–99)
OSMOLALITY: 277 (ref 275–301)
Potassium: 4.4 mmol/L (ref 3.5–5.1)
Sodium: 138 mmol/L (ref 136–145)

## 2013-10-23 LAB — TROPONIN I: Troponin-I: <0.02 ng/mL

## 2013-10-23 NOTE — Progress Notes (Signed)
Pre visit review using our clinic review tool, if applicable. No additional management support is needed unless otherwise documented below in the visit note. 

## 2013-10-30 ENCOUNTER — Encounter: Payer: Self-pay | Admitting: Internal Medicine

## 2013-10-30 DIAGNOSIS — R42 Dizziness and giddiness: Secondary | ICD-10-CM | POA: Insufficient documentation

## 2013-10-30 NOTE — Assessment & Plan Note (Signed)
Controlled.  

## 2013-10-30 NOTE — Assessment & Plan Note (Signed)
Low carb diet and exercise.  Will follow.

## 2013-10-30 NOTE — Assessment & Plan Note (Signed)
Unclear etiology.  Symptoms and exam as outlined.  Describes the pressure at the base of her skull.  Does not appear to be c/w benign positional vertigo.  Dr Lucky Cowboy following carotids.  Will refer to ER for further evaluation.  Want to confirm no CVA/TIA.

## 2013-10-30 NOTE — Assessment & Plan Note (Signed)
Sees Dr Saralyn Pilar.  Current symptoms as outlined.  Will refer to ER for further evaluation and treatment.

## 2013-10-30 NOTE — Assessment & Plan Note (Signed)
Blood pressure elevated today.  Has been doing better.  Will pursue further w/up and follow pressures.  Adjust medication if needed.

## 2013-10-30 NOTE — Assessment & Plan Note (Signed)
Recent liver panel wnl.  Follow.  

## 2013-10-30 NOTE — Progress Notes (Signed)
Subjective:    [name], female    DOB: 20-Jan-1946, 68 y.o.   MRN: [id number]  HPI 68 year old female with past history of hypertension, hypercholesterolemia and peripheral vascular disease.  She comes in today for a scheduled follow up.  States she has noticed a sharp pain in the right side of her chest.  Started last week.  Intermittent in nature.  Also describes some chest soreness.  This is different from the intermittent sharp pain and is reproducible on exam.  She also describes feeling light headed.  While talking on the phone, she felt like her hearing was muffled and felt like she was going to pass out.  Lasted approximately one minute.  Never lost consciousness.  The light headed sensation has continued.  She describes some pain at the base of her skull.  No room spinning.  No vision loss.  Some nausea.  Breathing stable.  No acid reflux.  Eating and drinking well.     Past Medical History  Diagnosis Date  . Depression   . History of chicken pox   . Glaucoma   . GERD (gastroesophageal reflux disease)   . Hyperlipidemia   . Hypertension   . Peripheral vascular disease   . Diffuse cystic mastopathy 2013  . High cholesterol   . Pneumonia March 2014    Current Outpatient Prescriptions on File Prior to Visit  Medication Sig Dispense Refill  . aspirin (BAYER ASPIRIN) 325 MG tablet Take 325 mg by mouth daily.      [name] losartan-hydrochlorothiazide (HYZAAR) 100-25 MG per tablet TAKE 1 TABLET BY MOUTH DAILY.  90 tablet  1  . metoprolol (LOPRESSOR) 50 MG tablet TAKE 1 TABLET (50 MG TOTAL) BY MOUTH 2 (TWO) TIMES DAILY.  60 tablet  5  . vitamin E 400 UNIT capsule Take 400 Units by mouth daily.      [name] VYTORIN 10-20 MG per tablet TAKE 1 TABLET BY MOUTH DAILY.  30 tablet  5   No current facility-administered medications on file prior to visit.    Review of Systems Patient denies any headache, but does report noticing some pressure at the base of her skull.  Lightheadedness  as outlined.  No room spinning.   No sinus or allergy symptoms.  No chest tightness or palpitations.  Does report the stabbing pain and chest soreness as outlined.  No increased shortness of breath, cough or congestion.  No acid reflux, dysphagia or odynophagia. No vomiting.  Some nausea.  No abdominal pain or cramping.  No bowel change, such as diarrhea, constipation, or melana.   She reports her blood pressure has been doing well.       Objective:   Physical Exam  Filed Vitals:   10/23/13 0937  BP: 110/70  Pulse: 62  Temp: 98.6 F (37 C)   Blood pressure recheck:  158/80, pulse 14  68 year old female in no acute distress.  She does not appear to feel well with standing.  Feels light headed.   HEENT:  Nares- clear.  Oropharynx - without lesions. NECK:  Supple.  Nontender.  No audible bruit.  HEART:  Appears to be regular. CHEST WALL:  Some reproducible pain to palpation over the anterior chest.  This is the chest pressure sensation, different from the intermittent sharp pain.  LUNGS:  No crackles or wheezing audible.  Respirations even and unlabored.  RADIAL PULSE:  Equal bilaterally.   ABDOMEN:  Soft, nontender.  Bowel sounds present and normal.  No audible abdominal bruit.    EXTREMITIES:  No increased edema present.  DP pulses palpable and equal bilaterally.           Assessment & Plan:  Cardiovascular.  Had ECHO 12/04/11 - EF 60%, mild left ventricular hypertrophy and mild to moderate mitral and tricuspid insufficiency.  Had follow up ECHO in the hospital as outlined in previous note.  Symptoms as outlined.  EKG obtained today revealed SR with no acute ischemic changes.  Given symptoms, will refer to ER for further evaluation and treatment management.  Pts family notified and transferred her to ER for further evaluation.     RENAL INSUFFICIENCY.  Noted in the hospital.  Improved with hydration.  Cr just checked - 1.0.  Stable.     HEALTH MAINTENANCE.  Physical last visit.    Mammograms through Dr Jamal Collin.  Last 07/27/13 - BiRADS I. Colonoscopy 2010.  Per Dr Jamal Collin - due 2015.   I spent 40 minutes with the patient and more than 50% of the time was spent in consultation regarding the above.

## 2013-10-30 NOTE — Assessment & Plan Note (Signed)
Sees Dr. Lucky Cowboy.  She is status post stent placement. Legs are doing well. Continue risk factor modification.  Dr Lucky Cowboy also following carotid ultrasounds.

## 2013-10-30 NOTE — Assessment & Plan Note (Signed)
Was followed by Dr Gabriel Carina. Biopsy negative.  States has been released.

## 2013-10-30 NOTE — Assessment & Plan Note (Signed)
On vytorin.  Follow lipid profile and liver function.  Low cholesterol diet.  Triglycerides continue to improve.  Triglycerides just checked - 127.  LDL - 99.

## 2013-11-01 ENCOUNTER — Encounter: Payer: Self-pay | Admitting: Internal Medicine

## 2013-11-01 ENCOUNTER — Ambulatory Visit (INDEPENDENT_AMBULATORY_CARE_PROVIDER_SITE_OTHER): Payer: Medicare Other | Admitting: Internal Medicine

## 2013-11-01 VITALS — BP 130/80 | HR 59 | Temp 98.5°F | Ht 65.5 in | Wt 190.8 lb

## 2013-11-01 DIAGNOSIS — I209 Angina pectoris, unspecified: Secondary | ICD-10-CM

## 2013-11-01 DIAGNOSIS — I25119 Atherosclerotic heart disease of native coronary artery with unspecified angina pectoris: Secondary | ICD-10-CM

## 2013-11-01 DIAGNOSIS — R0989 Other specified symptoms and signs involving the circulatory and respiratory systems: Secondary | ICD-10-CM

## 2013-11-01 DIAGNOSIS — L989 Disorder of the skin and subcutaneous tissue, unspecified: Secondary | ICD-10-CM

## 2013-11-01 DIAGNOSIS — I739 Peripheral vascular disease, unspecified: Secondary | ICD-10-CM

## 2013-11-01 DIAGNOSIS — I10 Essential (primary) hypertension: Secondary | ICD-10-CM

## 2013-11-01 DIAGNOSIS — R42 Dizziness and giddiness: Secondary | ICD-10-CM

## 2013-11-01 DIAGNOSIS — I2510 Atherosclerotic heart disease of native coronary artery without angina pectoris: Secondary | ICD-10-CM

## 2013-11-01 DIAGNOSIS — R0683 Snoring: Secondary | ICD-10-CM

## 2013-11-01 DIAGNOSIS — R0609 Other forms of dyspnea: Secondary | ICD-10-CM

## 2013-11-01 DIAGNOSIS — K219 Gastro-esophageal reflux disease without esophagitis: Secondary | ICD-10-CM

## 2013-11-01 DIAGNOSIS — R7309 Other abnormal glucose: Secondary | ICD-10-CM

## 2013-11-01 DIAGNOSIS — R739 Hyperglycemia, unspecified: Secondary | ICD-10-CM

## 2013-11-01 DIAGNOSIS — E7800 Pure hypercholesterolemia, unspecified: Secondary | ICD-10-CM

## 2013-11-01 NOTE — Progress Notes (Signed)
Pre visit review using our clinic review tool, if applicable. No additional management support is needed unless otherwise documented below in the visit note. 

## 2013-11-05 ENCOUNTER — Encounter: Payer: Self-pay | Admitting: Internal Medicine

## 2013-11-05 DIAGNOSIS — L989 Disorder of the skin and subcutaneous tissue, unspecified: Secondary | ICD-10-CM | POA: Insufficient documentation

## 2013-11-05 DIAGNOSIS — R0683 Snoring: Secondary | ICD-10-CM | POA: Insufficient documentation

## 2013-11-05 NOTE — Assessment & Plan Note (Signed)
Low carb diet and exercise.  Will follow.

## 2013-11-05 NOTE — Assessment & Plan Note (Signed)
Controlled.  

## 2013-11-05 NOTE — Assessment & Plan Note (Signed)
Sees Dr. Lucky Cowboy.  She is status post stent placement. Legs are doing well. Continue risk factor modification.  Dr Lucky Cowboy also following carotid ultrasounds.

## 2013-11-05 NOTE — Assessment & Plan Note (Signed)
On vytorin.  Follow lipid profile and liver function.  Low cholesterol diet.  Triglycerides continue to improve.  Triglycerides just checked - 127.  LDL - 99.

## 2013-11-05 NOTE — Assessment & Plan Note (Signed)
Better.  Still will notice some occasional lightheadedness.  Discussed ENT evaluation.  She declined.  Wants to monitor.  Will check sleep study as outlined.

## 2013-11-05 NOTE — Assessment & Plan Note (Signed)
With the snoring, fatigue, head pressure, etc, I do feels she warrants a sleep study.  Will schedule split night sleep study.

## 2013-11-05 NOTE — Progress Notes (Signed)
Subjective:    [name], female    DOB: 10/01/1945, 68 y.o.   MRN: [id number]  HPI 68 year old female with past history of hypertension, hypercholesterolemia and peripheral vascular disease.  She comes in today for a scheduled follow up/ER follow up.  She had noticed a sharp pain in the right side of her chest.   Also described some chest soreness.  See last note for details.   Was also light headed.  She was evaluated at the ER.  CT negative.  Labs unrevealing.  CK and troponin negative.  Blood pressure elevated in ER.  Since being discharged, her blood pressure has been doing better.  Recently blood pressure averaging 130s/65-75.  Light headedness has improved.   Breathing stable.  No acid reflux.  Eating and drinking well.  Feels from a cardiac standpoint, things are stable.  She does report feeling fatigued.  Not sleeping as well.  Does not feel rested when wakes up.  Snores.     Past Medical History  Diagnosis Date  . Depression   . History of chicken pox   . Glaucoma   . GERD (gastroesophageal reflux disease)   . Hyperlipidemia   . Hypertension   . Peripheral vascular disease   . Diffuse cystic mastopathy 2013  . High cholesterol   . Pneumonia March 2014    Current Outpatient Prescriptions on File Prior to Visit  Medication Sig Dispense Refill  . aspirin (BAYER ASPIRIN) 325 MG tablet Take 325 mg by mouth daily.      [name] losartan-hydrochlorothiazide (HYZAAR) 100-25 MG per tablet TAKE 1 TABLET BY MOUTH DAILY.  90 tablet  1  . metoprolol (LOPRESSOR) 50 MG tablet TAKE 1 TABLET (50 MG TOTAL) BY MOUTH 2 (TWO) TIMES DAILY.  60 tablet  5  . vitamin E 400 UNIT capsule Take 400 Units by mouth daily.      [name] VYTORIN 10-20 MG per tablet TAKE 1 TABLET BY MOUTH DAILY.  30 tablet  5   No current facility-administered medications on file prior to visit.    Review of Systems Lightheadedness as outlined.  No room spinning.   Some frontal headache.  No sinus or allergy symptoms.   No chest tightness or palpitations.  No chest pain reported today.  No increased shortness of breath, cough or congestion.  No acid reflux, dysphagia or odynophagia. No vomiting.  No abdominal pain or cramping.  No bowel change, such as diarrhea, constipation, or melana.   She reports her blood pressure has been doing better as outlined.  Snoring as outlined.       Objective:   Physical Exam  Filed Vitals:   11/01/13 0811  BP: 130/80  Pulse: 59  Temp: 98.5 F (36.9 C)   Blood pressure recheck:  136/78, pulse 12  68 year old female in no acute distress.  Able to sit and stand better.    HEENT:  Nares- clear.  Oropharynx - without lesions. NECK:  Supple.  Nontender.  No audible bruit.  HEART:  Appears to be regular. LUNGS:  No crackles or wheezing audible.  Respirations even and unlabored.  RADIAL PULSE:  Equal bilaterally.   ABDOMEN:  Soft, nontender.  Bowel sounds present and normal.  No audible abdominal bruit.    EXTREMITIES:  No increased edema present.  DP pulses palpable and equal bilaterally.           Assessment & Plan:  Cardiovascular.  Had ECHO 12/04/11 - EF 60%, mild left  ventricular hypertrophy and mild to moderate mitral and tricuspid insufficiency.  Had follow up ECHO in the hospital as outlined in previous note.  Currently stable.       RENAL INSUFFICIENCY.   Improved with hydration.  Cr just checked - 1.0.  Stable.     HEALTH MAINTENANCE.  Physical 06/20/13.   Mammograms through Dr Jamal Collin.  Last 07/27/13 - BiRADS I. Colonoscopy 2010.  Per Dr Jamal Collin - due 2015.   I spent 25 minutes with the patient and more than 50% of the time was spent in consultation regarding the above.

## 2013-11-05 NOTE — Assessment & Plan Note (Signed)
Blood pressure under better control.  Same medication regimen.  Follow.

## 2013-11-05 NOTE — Assessment & Plan Note (Signed)
Sees Dr Saralyn Pilar.  Currently stable.

## 2013-11-05 NOTE — Assessment & Plan Note (Signed)
Persistent lesion on forehead and temple area.  Refer to dermatology for evaluation.

## 2013-11-22 ENCOUNTER — Encounter: Payer: Self-pay | Admitting: Internal Medicine

## 2013-12-07 ENCOUNTER — Other Ambulatory Visit: Payer: Self-pay | Admitting: Internal Medicine

## 2014-01-02 ENCOUNTER — Encounter: Payer: Self-pay | Admitting: Internal Medicine

## 2014-01-02 ENCOUNTER — Ambulatory Visit (INDEPENDENT_AMBULATORY_CARE_PROVIDER_SITE_OTHER): Payer: Medicare Other | Admitting: Internal Medicine

## 2014-01-02 VITALS — BP 124/80 | HR 64 | Temp 97.9°F | Ht 65.5 in | Wt 192.8 lb

## 2014-01-02 DIAGNOSIS — R42 Dizziness and giddiness: Secondary | ICD-10-CM

## 2014-01-02 DIAGNOSIS — R0989 Other specified symptoms and signs involving the circulatory and respiratory systems: Secondary | ICD-10-CM

## 2014-01-02 DIAGNOSIS — R945 Abnormal results of liver function studies: Secondary | ICD-10-CM

## 2014-01-02 DIAGNOSIS — I25119 Atherosclerotic heart disease of native coronary artery with unspecified angina pectoris: Secondary | ICD-10-CM

## 2014-01-02 DIAGNOSIS — R0609 Other forms of dyspnea: Secondary | ICD-10-CM

## 2014-01-02 DIAGNOSIS — E2839 Other primary ovarian failure: Secondary | ICD-10-CM

## 2014-01-02 DIAGNOSIS — E041 Nontoxic single thyroid nodule: Secondary | ICD-10-CM

## 2014-01-02 DIAGNOSIS — R0683 Snoring: Secondary | ICD-10-CM

## 2014-01-02 DIAGNOSIS — R739 Hyperglycemia, unspecified: Secondary | ICD-10-CM

## 2014-01-02 DIAGNOSIS — K219 Gastro-esophageal reflux disease without esophagitis: Secondary | ICD-10-CM

## 2014-01-02 DIAGNOSIS — I209 Angina pectoris, unspecified: Secondary | ICD-10-CM

## 2014-01-02 DIAGNOSIS — I739 Peripheral vascular disease, unspecified: Secondary | ICD-10-CM

## 2014-01-02 DIAGNOSIS — I10 Essential (primary) hypertension: Secondary | ICD-10-CM

## 2014-01-02 DIAGNOSIS — I2510 Atherosclerotic heart disease of native coronary artery without angina pectoris: Secondary | ICD-10-CM

## 2014-01-02 DIAGNOSIS — E7800 Pure hypercholesterolemia, unspecified: Secondary | ICD-10-CM

## 2014-01-02 DIAGNOSIS — Z1211 Encounter for screening for malignant neoplasm of colon: Secondary | ICD-10-CM

## 2014-01-02 DIAGNOSIS — R7309 Other abnormal glucose: Secondary | ICD-10-CM

## 2014-01-02 DIAGNOSIS — R7989 Other specified abnormal findings of blood chemistry: Secondary | ICD-10-CM

## 2014-01-02 NOTE — Progress Notes (Signed)
Pre visit review using our clinic review tool, if applicable. No additional management support is needed unless otherwise documented below in the visit note. 

## 2014-01-02 NOTE — Progress Notes (Signed)
Subjective:    [name], female    DOB: November 16, 1945, 68 y.o.   MRN: [id number]  HPI 68 year old female with past history of hypertension, hypercholesterolemia and peripheral vascular disease.  She comes in today for a scheduled follow up.  States she is doing better.  No light headedness or dizziness.  No chest pain or tightness.  No increased sob.  No cough or congestion.  Blood pressure has been doing well, averaging 128-135/<80.  Saw Dr Jamal Collin recently.  States does not need colonoscopy until 2020.  Has never had bone density.  Discussed scheduling.  She does have several skin lesions she would like evaluated.  Will f/u with dermatology referral.  Overall feels better.  Is scheduled for her sleep study in 10/15.     Past Medical History  Diagnosis Date  . Depression   . History of chicken pox   . Glaucoma   . GERD (gastroesophageal reflux disease)   . Hyperlipidemia   . Hypertension   . Peripheral vascular disease   . Diffuse cystic mastopathy 2013  . High cholesterol   . Pneumonia March 2014    Current Outpatient Prescriptions on File Prior to Visit  Medication Sig Dispense Refill  . aspirin (BAYER ASPIRIN) 325 MG tablet Take 325 mg by mouth daily.      [name] losartan-hydrochlorothiazide (HYZAAR) 100-25 MG per tablet TAKE 1 TABLET BY MOUTH DAILY.  90 tablet  1  . metoprolol (LOPRESSOR) 50 MG tablet TAKE 1 TABLET (50 MG TOTAL) BY MOUTH 2 (TWO) TIMES DAILY.  60 tablet  5  . vitamin E 400 UNIT capsule Take 400 Units by mouth daily.      [name] VYTORIN 10-20 MG per tablet TAKE 1 TABLET BY MOUTH DAILY.  30 tablet  5   No current facility-administered medications on file prior to visit.    Review of Systems No headache, light headedness or dizziness reported.  No sinus or allergy symptoms.  No chest tightness or palpitations.  No chest pain reported today.  No increased shortness of breath, cough or congestion.  No acid reflux, dysphagia or odynophagia. No vomiting.  No  abdominal pain or cramping.  No bowel change, such as diarrhea, constipation, or melana.  She reports her blood pressure has been doing better as outlined.  Planning for sleep study in 10/15.  Skin lesions as outlined.  Feels better.       Objective:   Physical Exam  Filed Vitals:   01/02/14 0802  BP: 124/80  Pulse: 64  Temp: 97.9 F (36.6 C)   Blood pressure recheck:  15/72  68 year old female in no acute distress.    HEENT:  Nares- clear.  Oropharynx - without lesions. NECK:  Supple.  Nontender.  No audible bruit.  HEART:  Appears to be regular. LUNGS:  No crackles or wheezing audible.  Respirations even and unlabored.  RADIAL PULSE:  Equal bilaterally.   ABDOMEN:  Soft, nontender.  Bowel sounds present and normal.  No audible abdominal bruit.    EXTREMITIES:  No increased edema present.  DP pulses palpable and equal bilaterally.           Assessment & Plan:  Cardiovascular.  Had ECHO 12/04/11 - EF 60%, mild left ventricular hypertrophy and mild to moderate mitral and tricuspid insufficiency.  Had follow up ECHO in the hospital as outlined in previous note.  Currently stable.       RENAL INSUFFICIENCY.   Improved with hydration.  Cr last checked - 1.0.  Stable.     HEALTH MAINTENANCE.  Physical 06/20/13.   Mammograms through Dr Jamal Collin.  Last 07/27/13 - BiRADS I. Colonoscopy 2010.  Saw Dr Jamal Collin.  States colonoscopy due 2020.  IFOB given.  States was given pneumonia vaccine at the hospital.  Schedule bone density.    I spent 25 minutes with the patient and more than 50% of the time was spent in consultation regarding the above.

## 2014-01-06 ENCOUNTER — Encounter: Payer: Self-pay | Admitting: Internal Medicine

## 2014-01-06 NOTE — Assessment & Plan Note (Signed)
Sees Dr Saralyn Pilar.  Currently stable.

## 2014-01-06 NOTE — Assessment & Plan Note (Signed)
On vytorin.  Follow lipid profile and liver function.  Low cholesterol diet.  Triglycerides continue to improve.  Triglycerides just checked - 127.  LDL - 99.

## 2014-01-06 NOTE — Assessment & Plan Note (Signed)
Resolved

## 2014-01-06 NOTE — Assessment & Plan Note (Signed)
Sees Dr. Lucky Cowboy.  She is status post stent placement. Legs are doing well. Continue risk factor modification.  Dr Lucky Cowboy also following carotid ultrasounds.

## 2014-01-06 NOTE — Assessment & Plan Note (Signed)
Controlled.  

## 2014-01-06 NOTE — Assessment & Plan Note (Signed)
Was followed by Dr Gabriel Carina. Biopsy negative.  States has been released.

## 2014-01-06 NOTE — Assessment & Plan Note (Signed)
Low carb diet and exercise.  Will follow.

## 2014-01-06 NOTE — Assessment & Plan Note (Addendum)
Last liver panel (10/17/13)- wnl.  Follow.

## 2014-01-06 NOTE — Assessment & Plan Note (Signed)
Blood pressure under better control.  Same medication regimen.  Follow.

## 2014-01-06 NOTE — Assessment & Plan Note (Signed)
With the snoring, fatigue, head pressure, etc, she was scheduled for a sleep study.  Scheduled to be done in 10/15.

## 2014-01-23 ENCOUNTER — Other Ambulatory Visit (INDEPENDENT_AMBULATORY_CARE_PROVIDER_SITE_OTHER): Payer: Medicare Other

## 2014-01-23 ENCOUNTER — Encounter: Payer: Self-pay | Admitting: *Deleted

## 2014-01-23 DIAGNOSIS — Z1211 Encounter for screening for malignant neoplasm of colon: Secondary | ICD-10-CM

## 2014-01-23 LAB — FECAL OCCULT BLOOD, IMMUNOCHEMICAL: Fecal Occult Bld: NEGATIVE

## 2014-02-01 ENCOUNTER — Ambulatory Visit: Payer: Self-pay | Admitting: Internal Medicine

## 2014-02-01 LAB — HM DEXA SCAN

## 2014-02-02 ENCOUNTER — Encounter: Payer: Self-pay | Admitting: *Deleted

## 2014-02-02 DIAGNOSIS — I6523 Occlusion and stenosis of bilateral carotid arteries: Secondary | ICD-10-CM

## 2014-02-02 DIAGNOSIS — I890 Lymphedema, not elsewhere classified: Secondary | ICD-10-CM

## 2014-02-05 ENCOUNTER — Other Ambulatory Visit: Payer: Self-pay | Admitting: Internal Medicine

## 2014-02-11 ENCOUNTER — Telehealth: Payer: Self-pay | Admitting: Internal Medicine

## 2014-02-11 NOTE — Telephone Encounter (Signed)
Notify pt that bone density  - normal spine and osteopenia hip.  Continue weight bearing exercise and vitamin D.  Will discuss with her more at her next appt.

## 2014-02-12 ENCOUNTER — Encounter: Payer: Self-pay | Admitting: *Deleted

## 2014-02-12 NOTE — Telephone Encounter (Signed)
Letter mailed

## 2014-03-12 ENCOUNTER — Encounter: Payer: Self-pay | Admitting: Internal Medicine

## 2014-03-26 ENCOUNTER — Other Ambulatory Visit (INDEPENDENT_AMBULATORY_CARE_PROVIDER_SITE_OTHER): Payer: Medicare Other

## 2014-03-26 DIAGNOSIS — R7989 Other specified abnormal findings of blood chemistry: Secondary | ICD-10-CM

## 2014-03-26 DIAGNOSIS — R739 Hyperglycemia, unspecified: Secondary | ICD-10-CM

## 2014-03-26 DIAGNOSIS — R945 Abnormal results of liver function studies: Secondary | ICD-10-CM

## 2014-03-26 DIAGNOSIS — I10 Essential (primary) hypertension: Secondary | ICD-10-CM

## 2014-03-26 DIAGNOSIS — E7800 Pure hypercholesterolemia, unspecified: Secondary | ICD-10-CM

## 2014-03-26 LAB — HEPATIC FUNCTION PANEL
ALBUMIN: 4 g/dL (ref 3.5–5.2)
ALT: 21 U/L (ref 0–35)
AST: 18 U/L (ref 0–37)
Alkaline Phosphatase: 46 U/L (ref 39–117)
Bilirubin, Direct: 0 mg/dL (ref 0.0–0.3)
TOTAL PROTEIN: 7.2 g/dL (ref 6.0–8.3)
Total Bilirubin: 0.6 mg/dL (ref 0.2–1.2)

## 2014-03-26 LAB — LIPID PANEL
CHOL/HDL RATIO: 5
Cholesterol: 180 mg/dL (ref 0–200)
HDL: 36.8 mg/dL — AB (ref >39.00)
LDL Cholesterol: 118 mg/dL — ABNORMAL HIGH (ref 0–99)
NONHDL: 143.2
TRIGLYCERIDES: 125 mg/dL (ref 0.0–149.0)
VLDL: 25 mg/dL (ref 0.0–40.0)

## 2014-03-26 LAB — BASIC METABOLIC PANEL
BUN: 17 mg/dL (ref 6–23)
CO2: 21 meq/L (ref 19–32)
Calcium: 9.1 mg/dL (ref 8.4–10.5)
Chloride: 110 mEq/L (ref 96–112)
Creatinine, Ser: 0.9 mg/dL (ref 0.4–1.2)
GFR: 66.72 mL/min (ref >60.00)
Glucose, Bld: 117 mg/dL — ABNORMAL HIGH (ref 70–99)
Potassium: 4.6 mEq/L (ref 3.5–5.1)
SODIUM: 143 meq/L (ref 135–145)

## 2014-03-26 LAB — HEMOGLOBIN A1C: Hgb A1c MFr Bld: 6 % (ref 4.6–6.5)

## 2014-03-29 ENCOUNTER — Encounter: Payer: Self-pay | Admitting: Internal Medicine

## 2014-03-29 ENCOUNTER — Ambulatory Visit (INDEPENDENT_AMBULATORY_CARE_PROVIDER_SITE_OTHER): Payer: Medicare Other | Admitting: Internal Medicine

## 2014-03-29 VITALS — BP 130/80 | HR 69 | Temp 98.6°F | Ht 65.25 in | Wt 189.0 lb

## 2014-03-29 DIAGNOSIS — E7800 Pure hypercholesterolemia, unspecified: Secondary | ICD-10-CM

## 2014-03-29 DIAGNOSIS — R42 Dizziness and giddiness: Secondary | ICD-10-CM

## 2014-03-29 DIAGNOSIS — Z0000 Encounter for general adult medical examination without abnormal findings: Secondary | ICD-10-CM

## 2014-03-29 DIAGNOSIS — R739 Hyperglycemia, unspecified: Secondary | ICD-10-CM

## 2014-03-29 DIAGNOSIS — I10 Essential (primary) hypertension: Secondary | ICD-10-CM

## 2014-03-29 DIAGNOSIS — M8580 Other specified disorders of bone density and structure, unspecified site: Secondary | ICD-10-CM

## 2014-03-29 NOTE — Progress Notes (Signed)
Pre visit review using our clinic review tool, if applicable. No additional management support is needed unless otherwise documented below in the visit note. 

## 2014-03-31 ENCOUNTER — Encounter: Payer: Self-pay | Admitting: Internal Medicine

## 2014-03-31 DIAGNOSIS — M8580 Other specified disorders of bone density and structure, unspecified site: Secondary | ICD-10-CM | POA: Insufficient documentation

## 2014-03-31 NOTE — Progress Notes (Signed)
Subjective:    [name], female    DOB: 1945/07/21, 68 y.o.   MRN: [id number]  HPI The patient is here for annual Medicare wellness examination and management of other chronic and acute problems.   The risk factors are reflected in the social history.  The roster of all physicians providing medical care to patient [facility] (sees regularly) Dr Jamal Collin - follows mammograms Dr Lucky Cowboy - due f/u 03/2014  Activities of daily living:  The patient is 100% independent in all ADLs: dressing, toileting, feeding as well as independent mobility  Home safety : The patient has smoke detectors in the home. Wears her seatbelt.  She does have firearms at home. There is no violence in the home.   There is no risks for hepatitis, STDs or HIV. There is no  history of blood transfusion. They have no travel history to infectious disease endemic areas of the world.  The patient sees a dentist regularly.  She sees her eye doctor regularly and has been seen within the last year. She does not have any hearing difficulty.  She does not  have excessive sun exposure.  Diet: the importance of a healthy diet is discussed. She tries to watch what she eats.   The benefits of regular aerobic exercise were discussed. She does not perform regular exercise.    Depression screen: there are no signs or vegative symptoms of depression- irritability, change in appetite, anhedonia, sadness/tearfullness.  Cognitive assessment: the patient manages all their financial and personal affairs and is actively engaged. They could relate day,date,year and events; recalled 3/3 objects at 3 minutes; able to read clock face.   The following portions of the patient's history were reviewed and updated as appropriate: allergies, current medications, past family history, past medical history,  past surgical history, past social history  and problem list.  Visual acuity was assessed by CMA.  Hearing and body mass  index were assessed and reviewed.   During the course of the visit the patient was educated and counseled about appropriate screening and preventive services including : fall prevention , diabetes screening, nutrition counseling, colorectal cancer screening, and recommended immunizations.     Review of Systems Patient does report occasional lightheadedness.  States nothing severe.  Desires no further intervention.   No chest pain, tightness or palpitations.  No increased shortness of breath.  No nausea or vomiting.  No abdominal pain or cramping.  No bowel change.        Objective:   Physical Exam Filed Vitals:   03/29/14 0802  BP: 130/80  Pulse: 69  Temp: 98.6 F (51 C)   68 year old female in no acute distress.   HEENT:  Nares- clear.  Oropharynx - without lesions. NECK:  Supple.  Nontender.  No audible bruit.  HEART:  Appears to be regular. LUNGS:  No crackles or wheezing audible.  Respirations even and unlabored.  RADIAL PULSE:  Equal bilaterally.  ABDOMEN:  Soft, nontender.  Bowel sounds present and normal.  No audible abdominal bruit.  EXTREMITIES:  No increased edema present.  DP pulses palpable and equal bilaterally.          Assessment & Plan:  This is a routine wellness examination for this patient.  I reviewed all health maintenance protocols including mammography, colonoscopy, bone density.  Age and appropriate vaccinations discussed.  She declines flu shot and zostavax.  States had pneumovax 08/2012.  Her current medications and allergies reviewed.  MEDICARE ATTESTATION I have personally reviewed:  The patient's medical and social history. The use of alcohol, tobacco and illicit drugs. The current medications and supplements. The patient's function ability including ADLs, fall risks, home safety risks, cognitive, and hearing and visual impairment.   Diet and physical activities. Evaluation for depression and mood disorders.    The patient's weight, height, BMI  and visual acuity have been recorded in the chart.  I have made referrals, counseled and provided education to the patient based on review of the above.    I also examined pt and addressed the following issues:   Essential hypertension Blood pressure well controlled.  Same medication regimen.  Follow metabolic panel.   Light headedness Mild intermittent light headedness.  Declines further evaluation.    Osteopenia Recent bone density revealed osteopenia (with T score -1.6 in the femoral nec and .9 in the spine.  Continue calcium, vitamin D and weight bearing exercise.   Hypercholesteremia Recent cholesterol panel revealed LDL 118.  Low cholesterol diet.  Continue vytorin.  Follow.  Hold on adjustments in medication.  States she has been out of her routine of monitoring her diet and exercise (dealing with her sons medical issues).  Follow.  Lab Results  Component Value Date   CHOL 180 03/26/2014   HDL 36.80* 03/26/2014   LDLCALC 118* 03/26/2014   LDLDIRECT 132.1 06/13/2013   TRIG 125.0 03/26/2014   CHOLHDL 5 03/26/2014   Hyperglycemia Low carb diet and exercise.  a1c just checked 6.0.  Lab Results  Component Value Date   HGBA1C 6.0 03/26/2014

## 2014-04-10 ENCOUNTER — Encounter: Payer: Self-pay | Admitting: Internal Medicine

## 2014-05-11 DIAGNOSIS — C50912 Malignant neoplasm of unspecified site of left female breast: Secondary | ICD-10-CM

## 2014-05-11 DIAGNOSIS — Z923 Personal history of irradiation: Secondary | ICD-10-CM

## 2014-05-11 DIAGNOSIS — C50919 Malignant neoplasm of unspecified site of unspecified female breast: Secondary | ICD-10-CM

## 2014-05-11 HISTORY — DX: Personal history of irradiation: Z92.3

## 2014-05-11 HISTORY — DX: Malignant neoplasm of unspecified site of unspecified female breast: C50.919

## 2014-05-11 HISTORY — DX: Malignant neoplasm of unspecified site of left female breast: C50.912

## 2014-05-14 DIAGNOSIS — G4733 Obstructive sleep apnea (adult) (pediatric): Secondary | ICD-10-CM | POA: Diagnosis not present

## 2014-05-22 DIAGNOSIS — G4733 Obstructive sleep apnea (adult) (pediatric): Secondary | ICD-10-CM | POA: Diagnosis not present

## 2014-06-06 ENCOUNTER — Encounter: Payer: Self-pay | Admitting: Internal Medicine

## 2014-06-07 ENCOUNTER — Other Ambulatory Visit: Payer: Self-pay | Admitting: Internal Medicine

## 2014-06-14 DIAGNOSIS — G4733 Obstructive sleep apnea (adult) (pediatric): Secondary | ICD-10-CM | POA: Diagnosis not present

## 2014-06-27 ENCOUNTER — Other Ambulatory Visit (INDEPENDENT_AMBULATORY_CARE_PROVIDER_SITE_OTHER): Payer: Medicare Other

## 2014-06-27 DIAGNOSIS — I10 Essential (primary) hypertension: Secondary | ICD-10-CM

## 2014-06-27 DIAGNOSIS — M8580 Other specified disorders of bone density and structure, unspecified site: Secondary | ICD-10-CM | POA: Diagnosis not present

## 2014-06-27 DIAGNOSIS — E7800 Pure hypercholesterolemia, unspecified: Secondary | ICD-10-CM

## 2014-06-27 DIAGNOSIS — R739 Hyperglycemia, unspecified: Secondary | ICD-10-CM

## 2014-06-27 LAB — HEMOGLOBIN A1C: HEMOGLOBIN A1C: 6.2 % (ref 4.6–6.5)

## 2014-06-27 LAB — LIPID PANEL
CHOLESTEROL: 165 mg/dL (ref 0–200)
HDL: 47.9 mg/dL (ref >39.00)
LDL Cholesterol: 90 mg/dL (ref 0–99)
NonHDL: 117.1
Total CHOL/HDL Ratio: 3
Triglycerides: 135 mg/dL (ref 0.0–149.0)
VLDL: 27 mg/dL (ref 0.0–40.0)

## 2014-06-27 LAB — VITAMIN D 25 HYDROXY (VIT D DEFICIENCY, FRACTURES): VITD: 20.87 ng/mL — ABNORMAL LOW (ref 30.00–100.00)

## 2014-06-27 LAB — BASIC METABOLIC PANEL
BUN: 21 mg/dL (ref 6–23)
CHLORIDE: 106 meq/L (ref 96–112)
CO2: 26 mEq/L (ref 19–32)
Calcium: 9.4 mg/dL (ref 8.4–10.5)
Creatinine, Ser: 0.9 mg/dL (ref 0.40–1.20)
GFR: 65.82 mL/min (ref >60.00)
GLUCOSE: 105 mg/dL — AB (ref 70–99)
Potassium: 4.1 mEq/L (ref 3.5–5.1)
Sodium: 140 mEq/L (ref 135–145)

## 2014-06-27 LAB — HEPATIC FUNCTION PANEL
ALBUMIN: 4 g/dL (ref 3.5–5.2)
ALK PHOS: 46 U/L (ref 39–117)
ALT: 24 U/L (ref 0–35)
AST: 17 U/L (ref 0–37)
BILIRUBIN DIRECT: 0.1 mg/dL (ref 0.0–0.3)
Total Bilirubin: 0.6 mg/dL (ref 0.2–1.2)
Total Protein: 7.3 g/dL (ref 6.0–8.3)

## 2014-06-29 ENCOUNTER — Ambulatory Visit (INDEPENDENT_AMBULATORY_CARE_PROVIDER_SITE_OTHER): Payer: Medicare Other | Admitting: Internal Medicine

## 2014-06-29 ENCOUNTER — Encounter: Payer: Self-pay | Admitting: Internal Medicine

## 2014-06-29 VITALS — BP 140/80 | HR 62 | Temp 98.8°F | Ht 65.0 in | Wt 193.5 lb

## 2014-06-29 DIAGNOSIS — R221 Localized swelling, mass and lump, neck: Secondary | ICD-10-CM

## 2014-06-29 DIAGNOSIS — Z0000 Encounter for general adult medical examination without abnormal findings: Secondary | ICD-10-CM

## 2014-06-29 DIAGNOSIS — I739 Peripheral vascular disease, unspecified: Secondary | ICD-10-CM | POA: Diagnosis not present

## 2014-06-29 DIAGNOSIS — I10 Essential (primary) hypertension: Secondary | ICD-10-CM | POA: Diagnosis not present

## 2014-06-29 DIAGNOSIS — K219 Gastro-esophageal reflux disease without esophagitis: Secondary | ICD-10-CM | POA: Diagnosis not present

## 2014-06-29 DIAGNOSIS — R7989 Other specified abnormal findings of blood chemistry: Secondary | ICD-10-CM

## 2014-06-29 DIAGNOSIS — E041 Nontoxic single thyroid nodule: Secondary | ICD-10-CM

## 2014-06-29 DIAGNOSIS — R739 Hyperglycemia, unspecified: Secondary | ICD-10-CM

## 2014-06-29 DIAGNOSIS — I25119 Atherosclerotic heart disease of native coronary artery with unspecified angina pectoris: Secondary | ICD-10-CM

## 2014-06-29 DIAGNOSIS — R945 Abnormal results of liver function studies: Secondary | ICD-10-CM

## 2014-06-29 MED ORDER — METOPROLOL TARTRATE 50 MG PO TABS: 50.0000 mg | ORAL_TABLET | Freq: Every day | ORAL | Status: DC

## 2014-06-29 MED ORDER — EZETIMIBE-SIMVASTATIN 10-20 MG PO TABS: 1.0000 | ORAL_TABLET | Freq: Every day | ORAL | Status: DC

## 2014-06-29 NOTE — Progress Notes (Signed)
Pre visit review using our clinic review tool, if applicable. No additional management support is needed unless otherwise documented below in the visit note. 

## 2014-06-29 NOTE — Progress Notes (Addendum)
[name], female   DOB: January 21, 1946, 69 y.o.   MRN: [id number]   Subjective:    [name], female    DOB: 28-Mar-1946, 69 y.o.   MRN: [id number]  HPI  Patient here for her physical exam.  Has been under increased stress recently.  Her son is in ICU.  He is doing better.  Off the ventilator.  She feels she is coping relatively well.  Does not feel she needs anything more at this time.  Tries to stay activity.  No cardiac symptoms with increased activity or exertion.  Breathing stable.  No dizziness.  Eating and drinking well.  Bowels stable.     Past Medical History  Diagnosis Date  . Depression   . History of chicken pox   . Glaucoma   . GERD (gastroesophageal reflux disease)   . Hyperlipidemia   . Hypertension   . Peripheral vascular disease   . Diffuse cystic mastopathy 2013  . High cholesterol   . Pneumonia March 2014    Current Outpatient Prescriptions on File Prior to Visit  Medication Sig Dispense Refill  . aspirin (BAYER ASPIRIN) 325 MG tablet Take 325 mg by mouth daily.    [name] losartan-hydrochlorothiazide (HYZAAR) 100-25 MG per tablet TAKE 1 TABLET BY MOUTH DAILY. 90 tablet 1  . vitamin E 400 UNIT capsule Take 400 Units by mouth daily.     No current facility-administered medications on file prior to visit.    Review of Systems  Constitutional: Negative for appetite change and unexpected weight change.  HENT: Negative for congestion and sinus pressure.   Respiratory: Negative for cough, chest tightness and shortness of breath.   Cardiovascular: Negative for chest pain, palpitations and leg swelling.  Gastrointestinal: Negative for nausea, vomiting, abdominal pain and diarrhea.  Genitourinary: Negative for dysuria and difficulty urinating.  Skin: Negative for color change and rash.  Neurological: Negative for dizziness, light-headedness and headaches.  Psychiatric/Behavioral:       Increased stress with her son's medical issues.  Feels  she is coping well.         Objective:    Physical Exam  Constitutional: She appears well-developed and well-nourished. No distress.  HENT:  Nose: Nose normal.  Mouth/Throat: Oropharynx is clear and moist.  Neck: Neck supple. No thyromegaly present.  Increased palpable fullness - left neck.    Cardiovascular: Normal rate and regular rhythm.   Pulmonary/Chest: Breath sounds normal. No respiratory distress. She has no wheezes.  Breast exam reveals no nipple retraction or nipple discharge present.  Could not appreciate any nodules or axillary adenopathy.    Abdominal: Soft. Bowel sounds are normal. There is no tenderness.  Musculoskeletal: She exhibits no edema or tenderness.  Skin: No rash noted. No erythema.  Psychiatric: She has a normal mood and affect. Her behavior is normal.    BP 140/80 mmHg  Pulse 62  Temp(Src) 98.8 F (37.1 C) (Oral)  Ht _0  (1.651 m)  Wt 193 lb 8 oz (87.771 kg)  BMI 32.20 kg/m2  SpO2 97% Wt Readings from Last 3 Encounters:  06/29/14 193 lb 8 oz (87.771 kg)  03/29/14 189 lb (85.73 kg)  01/02/14 192 lb 12 oz (87.431 kg)     Lab Results  Component Value Date   WBC 6.2 10/17/2013   HGB 13.0 10/17/2013   HCT 38.4 10/17/2013   PLT 222.0 10/17/2013   GLUCOSE 105* 06/27/2014   CHOL 165 06/27/2014   TRIG 135.0 06/27/2014  HDL 47.90 06/27/2014   LDLDIRECT 132.1 06/13/2013   LDLCALC 90 06/27/2014   ALT 24 06/27/2014   AST 17 06/27/2014   NA 140 06/27/2014   K 4.1 06/27/2014   CL 106 06/27/2014   CREATININE 0.90 06/27/2014   BUN 21 06/27/2014   CO2 26 06/27/2014   TSH 4.45 10/17/2013   INR 0.9 07/13/2012   HGBA1C 6.2 06/27/2014       Assessment & Plan:   Problem List Items Addressed This Visit    Abnormal liver function test    Follow liver panel.        CAD (coronary artery disease)    Sees Dr Saralyn Pilar.  Asymptomatic.        Relevant Medications   metoprolol (LOPRESSOR) tablet   ezetimibe-simvastatin (VYTORIN) 10-20 MG per  tablet   GERD (gastroesophageal reflux disease)    Controlled.        Hyperglycemia    Low carb diet and exercise.  Follow met b and a1c.       Hypertension - Primary    Blood pressure doing well.  Same medication regimen.  Follow.  Check metabolic panel.        Relevant Medications   metoprolol (LOPRESSOR) tablet   ezetimibe-simvastatin (VYTORIN) 10-20 MG per tablet   Neck fullness    Persistent palpable nodule - post neck.  Refer to ENT for evaluation and further w/up.        Relevant Orders   Ambulatory referral to ENT   Peripheral vascular disease    Sees Dr Lucky Cowboy.  S/p stent placement.  Legs doing well.  Continue risk factor modification.       Relevant Medications   metoprolol (LOPRESSOR) tablet   ezetimibe-simvastatin (VYTORIN) 10-20 MG per tablet   Thyroid nodule    Was followed by Dr Gabriel Carina.  Biopsy negative.  Has been released.        Relevant Medications   metoprolol (LOPRESSOR) tablet    Other Visit Diagnoses    Routine check-up        Relevant Orders    Ambulatory referral to Dentistry      I spent 25 minutes with the patient and more than 50% of the time was spent in consultation regarding the above.     Einar Pheasant, MD

## 2014-07-02 ENCOUNTER — Encounter: Payer: Self-pay | Admitting: Internal Medicine

## 2014-07-02 NOTE — Assessment & Plan Note (Signed)
Follow liver panel.  

## 2014-07-02 NOTE — Assessment & Plan Note (Signed)
Blood pressure doing well.  Same medication regimen.  Follow.  Check metabolic panel.  

## 2014-07-02 NOTE — Assessment & Plan Note (Signed)
Was followed by Dr Gabriel Carina.  Biopsy negative.  Has been released.

## 2014-07-02 NOTE — Addendum Note (Signed)
Addended by: Alisa Graff on: 07/02/2014 10:12 AM   Modules accepted: Miquel Dunn

## 2014-07-02 NOTE — Assessment & Plan Note (Signed)
Persistent palpable nodule - post neck.  Refer to ENT for evaluation and further w/up.

## 2014-07-02 NOTE — Assessment & Plan Note (Signed)
Sees Dr Lucky Cowboy.  S/p stent placement.  Legs doing well.  Continue risk factor modification.

## 2014-07-02 NOTE — Assessment & Plan Note (Signed)
Controlled.  

## 2014-07-02 NOTE — Assessment & Plan Note (Signed)
Low carb diet and exercise.  Follow met b and a1c.  

## 2014-07-02 NOTE — Assessment & Plan Note (Signed)
Sees Dr Saralyn Pilar.  Asymptomatic.

## 2014-07-06 ENCOUNTER — Telehealth: Payer: Self-pay | Admitting: *Deleted

## 2014-07-06 DIAGNOSIS — G4733 Obstructive sleep apnea (adult) (pediatric): Secondary | ICD-10-CM | POA: Diagnosis not present

## 2014-07-06 DIAGNOSIS — E669 Obesity, unspecified: Secondary | ICD-10-CM | POA: Diagnosis not present

## 2014-07-06 NOTE — Telephone Encounter (Signed)
FYI: Patient has appt at [facility] on Monday 07/09/14 @ 10:15. Left voicemail on Pt cellphone with appt details [phone])

## 2014-07-09 DIAGNOSIS — H6123 Impacted cerumen, bilateral: Secondary | ICD-10-CM | POA: Diagnosis not present

## 2014-07-09 DIAGNOSIS — R221 Localized swelling, mass and lump, neck: Secondary | ICD-10-CM | POA: Diagnosis not present

## 2014-07-24 DIAGNOSIS — G4733 Obstructive sleep apnea (adult) (pediatric): Secondary | ICD-10-CM | POA: Diagnosis not present

## 2014-07-30 ENCOUNTER — Encounter: Payer: Self-pay | Admitting: General Surgery

## 2014-07-30 DIAGNOSIS — R922 Inconclusive mammogram: Secondary | ICD-10-CM | POA: Diagnosis not present

## 2014-07-30 DIAGNOSIS — R928 Other abnormal and inconclusive findings on diagnostic imaging of breast: Secondary | ICD-10-CM | POA: Diagnosis not present

## 2014-07-30 DIAGNOSIS — Z1231 Encounter for screening mammogram for malignant neoplasm of breast: Secondary | ICD-10-CM | POA: Diagnosis not present

## 2014-08-09 ENCOUNTER — Encounter: Payer: Self-pay | Admitting: General Surgery

## 2014-08-09 ENCOUNTER — Ambulatory Visit (INDEPENDENT_AMBULATORY_CARE_PROVIDER_SITE_OTHER): Payer: Medicare Other | Admitting: General Surgery

## 2014-08-09 ENCOUNTER — Telehealth: Payer: Self-pay | Admitting: *Deleted

## 2014-08-09 VITALS — BP 130/72 | HR 72 | Resp 12 | Ht 62.0 in | Wt 193.0 lb

## 2014-08-09 DIAGNOSIS — R920 Mammographic microcalcification found on diagnostic imaging of breast: Secondary | ICD-10-CM | POA: Diagnosis not present

## 2014-08-09 DIAGNOSIS — N6019 Diffuse cystic mastopathy of unspecified breast: Secondary | ICD-10-CM

## 2014-08-09 NOTE — Patient Instructions (Addendum)
Patient to have some additional  views done at [facility].   Patient has been scheduled for additional views of the left breast at [facility] for Tuesday, 08-14-14 at 2 pm.

## 2014-08-09 NOTE — Telephone Encounter (Signed)
Received a notice of denial of medical coverage for [employer]. Copy of letter placed in Dr Nicki Reaper folder for review.

## 2014-08-09 NOTE — Progress Notes (Signed)
Patient ID: Hampton Abbot, female   DOB: Sep 22, 1945, 70 y.o.   MRN: [id number]  Chief Complaint  Patient presents with  . Follow-up    mammogram    HPI FRANZISKA PODGURSKI is a 69 y.o. female who presents for a breast evaluation. The most recent mammogram was done on  07/30/14. She did not want to have her added views done. Patient does perform regular self breast checks and gets regular mammograms done.    HPI  Past Medical History  Diagnosis Date  . Depression   . History of chicken pox   . Glaucoma   . GERD (gastroesophageal reflux disease)   . Hyperlipidemia   . Hypertension   . Peripheral vascular disease   . Diffuse cystic mastopathy 2013  . High cholesterol   . Pneumonia March 2014    Past Surgical History  Procedure Laterality Date  . Cholecystectomy  2006  . Breast biopsy  2008  . Leg stent  2011  . Back surgery  1986    ruptured disc  . Dilation and curettage of uterus    . Colonoscopy  2010    Dr. Jamal Collin  . Mole removal  2013    15 removed    Family History  Problem Relation Age of Onset  . Cancer Father     Prostate  . Hyperlipidemia Other     Parent  . Miscarriages / Stillbirths Other     Parent  . Hypertension Other     parent  . Heart disease Other     Parent  . Breast cancer Maternal Aunt     Social History History  Substance Use Topics  . Smoking status: Never Smoker   . Smokeless tobacco: Never Used  . Alcohol Use: No    Allergies  Allergen Reactions  . Codeine Nausea And Vomiting    Current Outpatient Prescriptions  Medication Sig Dispense Refill  . aspirin (BAYER ASPIRIN) 325 MG tablet Take 325 mg by mouth daily.    [name] ezetimibe-simvastatin (VYTORIN) 10-20 MG per tablet Take 1 tablet by mouth daily. 30 tablet 5  . losartan-hydrochlorothiazide (HYZAAR) 100-25 MG per tablet TAKE 1 TABLET BY MOUTH DAILY. 90 tablet 1  . metoprolol (LOPRESSOR) 50 MG tablet Take 1 tablet (50 mg total) by mouth daily. 30 tablet 5  . vitamin E 400 UNIT  capsule Take 400 Units by mouth daily.     No current facility-administered medications for this visit.    Review of Systems Review of Systems  Constitutional: Negative.   Respiratory: Negative.   Cardiovascular: Negative.     Blood pressure 130/72, pulse 72, resp. rate 12, height 5\' 2"  (1.575 m), weight 193 lb (87.544 kg).  Physical Exam Physical Exam  Constitutional: She is oriented to person, place, and time. She appears well-developed and well-nourished.  Eyes: Conjunctivae are normal. No scleral icterus.  Neck: Neck supple.  Cardiovascular: Normal rate, regular rhythm and normal heart sounds.   Pulmonary/Chest: Effort normal and breath sounds normal. Right breast exhibits no inverted nipple, no mass, no nipple discharge, no skin change and no tenderness. Left breast exhibits no inverted nipple, no mass, no nipple discharge, no skin change and no tenderness.  Abdominal: Soft. Bowel sounds are normal. There is no tenderness.  Lymphadenopathy:    She has no cervical adenopathy.    She has no axillary adenopathy.  Neurological: She is alert and oriented to person, place, and time.  Skin: Skin is warm and dry.  Data Reviewed Mammogram reviewed.   Assessment    There are some calcifications in the left breast LOQ that warrant some additional  views.     Plan    Discussed fully with pt and she is agreeable to additional views of left breast     Follow up based on the final report of mammogram.  Patient has been scheduled for additional views of the left breast at [facility] for Tuesday, 08-14-14 at 2 pm.  Junie Panning G 08/09/2014, 4:35 PM

## 2014-08-10 HISTORY — PX: BREAST LUMPECTOMY: SHX2

## 2014-08-14 ENCOUNTER — Telehealth: Payer: Self-pay | Admitting: *Deleted

## 2014-08-14 DIAGNOSIS — R921 Mammographic calcification found on diagnostic imaging of breast: Secondary | ICD-10-CM | POA: Diagnosis not present

## 2014-08-14 DIAGNOSIS — R928 Other abnormal and inconclusive findings on diagnostic imaging of breast: Secondary | ICD-10-CM | POA: Diagnosis not present

## 2014-08-14 NOTE — Telephone Encounter (Signed)
Pt had addivews today at [facility] 08/14/14 and they are recommending a Stereotactic BX, they said they recommend her to have the soon. Patient is worried and wants to have this done as soon as she can.

## 2014-08-15 NOTE — Telephone Encounter (Signed)
Patient has been scheduled for a left breast stereotactic biopsy at [facility] for 08-20-14 at 2. She will check-in at the [facility] at 1:30 pm. Patient instructed to decrease 325 mg aspirin to 81 mg aspirin. This patient is aware of date, time, and instructions. Patient verbalizes understanding.

## 2014-08-15 NOTE — Telephone Encounter (Signed)
I reviewed the additional films on left breast. Agree she needs biopsy for a linear calcifications in left lower outer breast. Contacted pt by phone and advised her fully on stereotactic biopsy. She is agreeable.  Schedule above

## 2014-08-20 ENCOUNTER — Ambulatory Visit
Admit: 2014-08-20 | Disposition: A | Discharge status: Home / Self Care | Payer: Self-pay | Attending: General Surgery | Admitting: General Surgery

## 2014-08-20 ENCOUNTER — Encounter: Payer: Self-pay | Admitting: General Surgery

## 2014-08-20 DIAGNOSIS — C50912 Malignant neoplasm of unspecified site of left female breast: Secondary | ICD-10-CM | POA: Diagnosis not present

## 2014-08-20 DIAGNOSIS — D0512 Intraductal carcinoma in situ of left breast: Secondary | ICD-10-CM | POA: Diagnosis not present

## 2014-08-20 DIAGNOSIS — R920 Mammographic microcalcification found on diagnostic imaging of breast: Secondary | ICD-10-CM | POA: Diagnosis not present

## 2014-08-20 HISTORY — PX: BREAST BIOPSY: SHX20

## 2014-08-21 ENCOUNTER — Telehealth: Payer: Self-pay | Admitting: General Surgery

## 2014-08-21 NOTE — Telephone Encounter (Signed)
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 Need to discuss treatment options . Have asked pt to come tomorrow 08/22/14 at 3.30 pm for full discussion.

## 2014-08-22 ENCOUNTER — Ambulatory Visit (INDEPENDENT_AMBULATORY_CARE_PROVIDER_SITE_OTHER): Payer: Medicare Other | Admitting: General Surgery

## 2014-08-22 ENCOUNTER — Encounter: Payer: Self-pay | Admitting: General Surgery

## 2014-08-22 VITALS — BP 172/80 | HR 66 | Resp 12 | Ht 65.5 in | Wt 193.0 lb

## 2014-08-22 DIAGNOSIS — C50512 Malignant neoplasm of lower-outer quadrant of left female breast: Secondary | ICD-10-CM

## 2014-08-22 NOTE — Patient Instructions (Addendum)
The patient is aware to call back for any questions or concerns.  Patient's surgery has been scheduled for 09-05-14 at [facility]. This patient has been asked to decrease current 325 mg aspirin to 81 mg aspirin starting one week prior to procedure.

## 2014-08-22 NOTE — Progress Notes (Signed)
Here today for discussion. Biopsy site with minimal bruising. Steri strips intact. 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 ER/PR deferred to excision specimen. Pt was present with her daughter and daughter-in-law. Explained findings, implications, treatment options, role of radiation and chemo. Current stage is 1. Recommended lumpectomy and SN biopsy. Procedure, risks and benefits explained. Pt is agreeable. Patient's surgery has been scheduled for 09-05-14 at [facility]. This patient has been asked to decrease current 325 mg aspirin to 81 mg aspirin starting one week prior to procedure.

## 2014-08-27 ENCOUNTER — Ambulatory Visit
Admit: 2014-08-27 | Disposition: A | Discharge status: Home / Self Care | Payer: Self-pay | Attending: General Surgery | Admitting: General Surgery

## 2014-08-27 ENCOUNTER — Ambulatory Visit: Payer: Medicare Other

## 2014-08-27 DIAGNOSIS — I10 Essential (primary) hypertension: Secondary | ICD-10-CM | POA: Diagnosis not present

## 2014-08-27 DIAGNOSIS — Z01810 Encounter for preprocedural cardiovascular examination: Secondary | ICD-10-CM | POA: Diagnosis not present

## 2014-08-27 DIAGNOSIS — Z01812 Encounter for preprocedural laboratory examination: Secondary | ICD-10-CM | POA: Diagnosis not present

## 2014-08-27 LAB — POTASSIUM: POTASSIUM: 3.8 mmol/L

## 2014-08-31 NOTE — H&P (Signed)
PATIENT NAME:  Lindsey Bentley, Lindsey Bentley MR#:  [id number] DATE OF BIRTH:  03/23/1946  DATE OF ADMISSION:  07/13/2012  ADMITTING PHYSICIAN: Gladstone Lighter, MD  PRIMARY CARE PHYSICIAN: Einar Pheasant, MD  CHIEF COMPLAINT: Generalized weakness and syncope.   HISTORY OF PRESENT ILLNESS: The patient is a 69 year old pleasant Caucasian female with a past medical history significant for hypertension, hyperlipidemia, obstructive sleep apnea and peripheral vascular disease, comes from home secondary to generalized weakness going on for almost 2 weeks now. Also associated with nausea, burning chest pain and generalized diffuse abdominal pain. She was in the ER, found to be hypotensive with blood pressure of 80/60 and had a syncopal episodes here in the ER. She also complains of 2 episodes of bright red blood per rectum, but only after wiping. She was diaphoretic and short of breath while in the ED.   PAST MEDICAL HISTORY:  1.  Hypertension.  2.  Hyperlipidemia.  3.  Peripheral vascular disease.  4.  Obstructive sleep apnea.   PAST SURGICAL HISTORY: Stent placement for her right leg and also back surgery.   ALLERGIES TO MEDICATIONS: CODEINE.   HOME MEDICATIONS:  1.  Aspirin 325 mg p.o. daily.  2.  Losartan/HCTZ 100/25 mg 1 tablet daily.  3.  Metoprolol 50 mg p.o. b.i.d.  4.  Aldactone 25 mg p.o. daily.  5.  Vitamin E 400 international units daily. 6.  Vytorin 10/20 mg 1 tablet p.o. daily.   SOCIAL HISTORY: Lives at home with her husband. No smoking or alcohol use.   FAMILY HISTORY: Mom with hypertension and dad had CVA.   REVIEW OF SYSTEMS:    CONSTITUTIONAL: Positive for fatigue and weakness. No fevers.  EYES: No blurred vision, double vision, inflammation or glaucoma. Uses reading glasses.  ENT: No tinnitus, ear pain, hearing loss, epistaxis or discharge.  RESPIRATORY: Positive for cough. No wheeze, hemoptysis or dyspnea.  CARDIOVASCULAR: Positive for chest pressure. No orthopnea, edema,  arrhythmia, palpitations. Positive for syncope.  GASTROINTESTINAL: Positive for nausea. No vomiting or diarrhea. Positive for abdominal pain and rectal bleed.  GENITOURINARY: No dysuria, hematuria, renal calculus, frequency or incontinence.  ENDOCRINE: No polyuria, nocturia, thyroid problems, heat or cold intolerance.  HEMATOLOGIC: No anemia, easy bruising or bleeding.  SKIN: No acne, rash or lesions.  LYMPHATIC: No cervical or inguinal lymphadenopathy.  NEUROLOGICAL: No numbness, weakness, CVA, TIA or seizures.  PSYCHOLOGICAL: No anxiety, insomnia or depression.   PHYSICAL EXAMINATION:  VITAL SIGNS: Temperature afebrile, pulse 55, respirations 24, blood pressure 80/60, pulse oximetry 97% on room air.  GENERAL: Well-built, well-nourished female lying in bed, not in any acute distress.  HEENT: Normocephalic, atraumatic. Pupils equal, round, reacting to light. Anicteric sclerae. Extraocular movements intact. Oropharynx clear without erythema, mass or exudates.  NECK: Supple. No thyromegaly, JVD or carotid bruits. No lymphadenopathy.  LUNGS: Clear to auscultation. Mild expiratory wheeze only on coughing. No crackles or rhonchi. No use of accessory muscles for breathing.  CARDIOVASCULAR: S1, S2, regular rate and rhythm. No murmurs, rubs or gallops.  ABDOMEN: Soft, diffuse achiness but no guarding, rigidity or tenderness seen. No hepatosplenomegaly. Normal bowel sounds.  EXTREMITIES: No pedal edema, no clubbing or cyanosis, 2+ dorsalis pedis pulses palpable bilaterally.  SKIN: No acne, rash or lesions. LYMPHATIC: No cervical or inguinal lymphadenopathy.  NEUROLOGICAL: Cranial nerves intact, 2+ deep motor reflexes bilateral lower extremities present. Strength is 5/5 all 4 extremities. No focal sensory abnormalities.  PSYCHOLOGICAL: The patient is awake, alert, oriented x 3.   LABORATORY AND DIAGNOSTIC DATA:  WBC 12.5, hemoglobin 12.8, hematocrit 37.4, platelet count 261. Lactic acid 1.9. INR 0.9. CK  80, CK-MB less than 0.5, troponin less than 0.02. Sodium 137, potassium 4.2, chloride 106, bicarbonate 23, BUN 35, creatinine 1.32, glucose 95, calcium 9.0. ALT 73, AST 39, alkaline phosphatase 49, total bilirubin 0.5, albumin 3.4. EKG showing normal sinus rhythm, heart rate of 62, no acute ST-T wave abnormalities.   ASSESSMENT AND PLAN: This is a 69 year old female with past medical history of hypertension and hyperlipidemia who was recently treated for upper respiratory tract infection and pneumonia with prednisone and antibiotics 2 weeks ago. Comes to the Emergency Room for weakness, hypertension and syncopal episode here in the Emergency Department. 1.  Syncope, likely hypovolemia, weakness and dehydration. Will monitor on off-unit telemetry. Intravenous fluids.  2.  Hypotension, likely from hypovolemia, improving with fluids. Will hold her blood pressure medications at this time. Blood cultures and urinalysis have been ordered to rule out infection. Will hold off on any antibiotic at this time. Influenza test has been ordered with her generalized complaints.  3.  Acute renal failure, likely prerenal. Follow up after giving intravenous fluids today.  4.  Nausea and chest pain, likely reflux symptoms. Has known history of gastroesophageal reflux disease and takes Zantac as needed, especially with recent prednisone use and antibiotic use might have gotten worse. Will start on Protonix twice daily for now and can be changed to daily at discharge.  5.  Rectal bleed, only on wiping after a bowel movement. Likely hemorrhoidal or diverticular. She is due for a repeat colonoscopy again in 1 to 2 years. If no further bleeding while in the hospital, will follow up as an outpatient. Hemoccult and hemoglobin followup. Continue to hold aspirin at this time.   CODE STATUS: Full code.   TIME SPENT ON ADMISSION: 50 minutes.  ____________________________ Gladstone Lighter, MD [id number] D: 07/13/2012 15:59:37  ET T: 07/13/2012 17:04:14 ET JOB#: [id number]  cc: Gladstone Lighter, MD, <Dictator> Einar Pheasant, MD Gladstone Lighter MD ELECTRONICALLY SIGNED 07/23/2012 13:17

## 2014-08-31 NOTE — Discharge Summary (Signed)
PATIENT NAME:  Lindsey Bentley, Lindsey Bentley MR#:  [id number] DATE OF BIRTH:  [birth year]  DATE OF ADMISSION:  07/13/2012 DATE OF DISCHARGE:  07/16/2012  PRESENTING COMPLAINT:  Generalized weakness and syncope.   PRIMARY CARE PHYSICIAN:  Einar Pheasant, MD  DISCHARGE DIAGNOSES:   1.  Syncope due to hypotension and dehydration, resolved.  2.  Gastroesophageal reflux disease.  3.  Hypertension.   CODE STATUS:  FULL CODE.   MEDICATIONS:   1.  Vytorin 10/20 mg 1 tablet daily.  2.  Aspirin 325 mg daily.  3.  Vitamin E 400 International Units daily.  4.  Metoprolol tartrate 50 mg b.i.d.  5.  Hydrochlorothiazide 12.5 p.o. daily.  6.  Losartan 100 mg daily.   HOME OXYGEN:  None.   ACTIVITY LIMITATIONS:  None.   FOLLOWUP:  With Dr. Einar Pheasant in 1 to 2 weeks.   CONSULTATION:  Physical therapy.   Occult feces negative.   Echo Doppler showed normal ejection fraction 60% to 65%, normal global left ventricular systolic function, impaired relaxation pattern of LV diastolic filling, mild concentric left ventricular hypertrophy. Ultrasound carotid Doppler bilateral: No visualized definite atherosclerotic severe stenosis noted.   CBC within normal limits.   Basic metabolic panel within normal limits except bicarbonate of 19 and calcium of 8.0. Cardiac enzymes x3 are negative. Influenza A plus B negative. H1N1 influenza negative. EKG showed normal sinus rhythm. Blood cultures negative in 36 hours. Urine cultures negative. On admission, creatinine was 1.3.   BRIEF SUMMARY OF HOSPITAL COURSE:  The patient is a 69 year old Caucasian female with a history of hypertension, hyperlipidemia and treated for URI with prednisone and antibiotic Levaquin 2 weeks ago comes in with weakness, hypotension and syncopal episode here in the Emergency Room. She was admitted with syncope which was likely due to dehydration and hypovolemia. It got resolved after she got IV fluids. Her BP meds were resumed back. Echo and  ultrasound nondiagnostic, no more dizziness, and patient's blood pressure is stable. She is eating and tolerating diet well prior to discharge.  1.  Hypotension, resolved, likely due to dehydration, improved. Urine culture and blood cultures are negative.  2.  Hypertension. Resumed home medications prior to discharge.  3.  Acute renal failure appears prerenal azotemia secondary to poor p.o. intake improved with IV fluids.  4.  Over all hospital stay remained stable. The patient remained a FULL CODE.   TIME SPENT:  40 minutes.    ____________________________ Hart Rochester Posey Pronto, MD sap:si D: 07/18/2012 14:02:09 ET T: 07/18/2012 16:32:00 ET JOB#: [id number]  cc: Rennie Rouch A. Posey Pronto, MD, <Dictator> Einar Pheasant, MD  Ilda Basset MD ELECTRONICALLY SIGNED 07/21/2012 15:08

## 2014-09-03 LAB — SURGICAL PATHOLOGY

## 2014-09-05 ENCOUNTER — Encounter: Payer: Self-pay | Admitting: General Surgery

## 2014-09-05 ENCOUNTER — Ambulatory Visit
Admit: 2014-09-05 | Disposition: A | Discharge status: Home / Self Care | Payer: Self-pay | Attending: General Surgery | Admitting: General Surgery

## 2014-09-05 ENCOUNTER — Ambulatory Visit
Admit: 2014-09-05 | Disposition: A | Discharge status: Home / Self Care | Payer: Medicare Other | Admitting: General Surgery

## 2014-09-05 DIAGNOSIS — C50912 Malignant neoplasm of unspecified site of left female breast: Secondary | ICD-10-CM | POA: Diagnosis not present

## 2014-09-05 DIAGNOSIS — Z91048 Other nonmedicinal substance allergy status: Secondary | ICD-10-CM | POA: Diagnosis not present

## 2014-09-05 DIAGNOSIS — E780 Pure hypercholesterolemia: Secondary | ICD-10-CM | POA: Diagnosis not present

## 2014-09-05 DIAGNOSIS — R600 Localized edema: Secondary | ICD-10-CM | POA: Diagnosis not present

## 2014-09-05 DIAGNOSIS — D0512 Intraductal carcinoma in situ of left breast: Secondary | ICD-10-CM | POA: Diagnosis not present

## 2014-09-05 DIAGNOSIS — Z79899 Other long term (current) drug therapy: Secondary | ICD-10-CM | POA: Diagnosis not present

## 2014-09-05 DIAGNOSIS — Z791 Long term (current) use of non-steroidal anti-inflammatories (NSAID): Secondary | ICD-10-CM | POA: Diagnosis not present

## 2014-09-05 DIAGNOSIS — M1990 Unspecified osteoarthritis, unspecified site: Secondary | ICD-10-CM | POA: Diagnosis not present

## 2014-09-05 DIAGNOSIS — Z8249 Family history of ischemic heart disease and other diseases of the circulatory system: Secondary | ICD-10-CM | POA: Diagnosis not present

## 2014-09-05 DIAGNOSIS — I739 Peripheral vascular disease, unspecified: Secondary | ICD-10-CM | POA: Diagnosis not present

## 2014-09-05 DIAGNOSIS — Z803 Family history of malignant neoplasm of breast: Secondary | ICD-10-CM | POA: Diagnosis not present

## 2014-09-05 DIAGNOSIS — Z7982 Long term (current) use of aspirin: Secondary | ICD-10-CM | POA: Diagnosis not present

## 2014-09-05 DIAGNOSIS — Z885 Allergy status to narcotic agent status: Secondary | ICD-10-CM | POA: Diagnosis not present

## 2014-09-05 DIAGNOSIS — G4730 Sleep apnea, unspecified: Secondary | ICD-10-CM | POA: Diagnosis not present

## 2014-09-05 DIAGNOSIS — G629 Polyneuropathy, unspecified: Secondary | ICD-10-CM | POA: Diagnosis not present

## 2014-09-05 DIAGNOSIS — Z9889 Other specified postprocedural states: Secondary | ICD-10-CM | POA: Diagnosis not present

## 2014-09-05 DIAGNOSIS — C50512 Malignant neoplasm of lower-outer quadrant of left female breast: Secondary | ICD-10-CM | POA: Diagnosis not present

## 2014-09-05 DIAGNOSIS — I10 Essential (primary) hypertension: Secondary | ICD-10-CM | POA: Diagnosis not present

## 2014-09-05 DIAGNOSIS — Z888 Allergy status to other drugs, medicaments and biological substances status: Secondary | ICD-10-CM | POA: Diagnosis not present

## 2014-09-05 DIAGNOSIS — E669 Obesity, unspecified: Secondary | ICD-10-CM | POA: Diagnosis not present

## 2014-09-05 DIAGNOSIS — Z9049 Acquired absence of other specified parts of digestive tract: Secondary | ICD-10-CM | POA: Diagnosis not present

## 2014-09-05 DIAGNOSIS — M4602 Spinal enthesopathy, cervical region: Secondary | ICD-10-CM | POA: Diagnosis not present

## 2014-09-05 HISTORY — PX: BREAST SURGERY: SHX581

## 2014-09-06 ENCOUNTER — Encounter: Payer: Self-pay | Admitting: General Surgery

## 2014-09-07 ENCOUNTER — Encounter: Payer: Self-pay | Admitting: General Surgery

## 2014-09-09 NOTE — Op Note (Addendum)
PATIENT NAME:  Lindsey Bentley, FULTS MR#:  [id number] DATE OF BIRTH:  [birth year]  DATE OF PROCEDURE:  09/05/2014  PREOPERATIVE DIAGNOSIS: Carcinoma of the left breast, lower outer quadrant.   POSTOPERATIVE DIAGNOSIS: Carcinoma of the left breast, lower outer quadrant.   OPERATION: Left breast lumpectomy, left axillary sentinel node biopsy, and MammoSite evaluation with the use of cavity evaluation device.   SURGEON: Mckinley Jewel, MD   ANESTHESIA: General.   COMPLICATIONS: None.   ESTIMATED BLOOD LOSS: Minimal.   DRAINS: None.   PROCEDURE: The patient underwent localization of the primary site in the lower outer quadrant with mammographic wire placement preoperatively. She also had sentinel node contrast injection. The patient was placed in the supine position, and reached the operating table, and put to sleep with an LMA. For additional verification of sentinel node, 5 mL of 50% diluted methylene blue was injected into the subareolar space. The left breast and axilla were prepped and draped out as a sterile field and timeout was performed. With the use of a gamma finder, signal activity was noted in the inferior most portion of the axilla in the medial location. A transverse skin incision was made overlying this. I had carefully deepened through the tissues down to the axillary fat pad. Two nodes were identified, which were both blue and both had signal activity. It was sent off as sentinel nodes 1 and 2, both showing no evidence of macrometastases on touch prep. After removal of these 2 nodes, signal activity was completely negative and there were no other visible nodes in this area. The deeper tissue seal closed with 2-0 Vicryl and the skin closed with subcuticular 4-0 Vicryl. Marcaine 10 mL of 0.5% Marcaine was instilled in the wound for postoperative analgesia. The Y was placed in the inferior location going towards the 5 o'clock location in the left breast. A curvilinear incision was mapped out  overlying the 5 o'clock location and carefully deepened to the subcutaneous tissue, and the wire was then freed from the skin. With the wire as a guide, a good core of tissue was excised out which contained a firm nodular area in the middle. The closest area appeared to be the caudle region, but this was very close to the skin region and it grossly appeared to be free. The specimen mammogram confirmed the presence of the clip within these specimens. The tissue with marked for margins and sent to pathology. After ensuring hemostasis, the wound was irrigated and closed in 2 layers of 2-0 Vicryl and the deeper tissue and the skin with subcuticular 4-0 Vicryl. A small 1 cm incision was made just lateral to the lumpectomy site and with the Kelly clamp, dissected down into the pocket of lumpectomy. A cavity evaluation device was then positioned through this into the lumpectomy site and then inflated the balloon with 40 mL of fluid. Ultrasound was performed showing that the balloon was lying in a proper position and the skin to the balloon, the distance, arranged from 1 to 1.2 cm all around. It was felt that this might be a suitable situation for subsequent MammoSite placement.  A cavity evaluation of the base was then removed and the skin incision, near closeness of subcuticular 4-0 Vicryl. LiquiBand was applied on all the incisions in the patient subsequently extubated and returned to the recovery room in a week's stable condition.    ____________________________ S.Robinette Haines, MD sgs:TM D: 09/05/2014 17:32:09 ET T: 09/06/2014 03:07:53 ET JOB#: [id number]  cc: Synthia Innocent. Jamal Collin, MD, <  Dictator> [facility] Robinette Haines MD ELECTRONICALLY SIGNED 09/11/2014 16:08

## 2014-09-10 LAB — SURGICAL PATHOLOGY

## 2014-09-18 ENCOUNTER — Encounter: Payer: Self-pay | Admitting: General Surgery

## 2014-09-18 ENCOUNTER — Ambulatory Visit (INDEPENDENT_AMBULATORY_CARE_PROVIDER_SITE_OTHER): Payer: Medicare Other | Admitting: General Surgery

## 2014-09-18 VITALS — BP 126/60 | HR 68 | Resp 12 | Ht 65.5 in | Wt 193.0 lb

## 2014-09-18 DIAGNOSIS — C50512 Malignant neoplasm of lower-outer quadrant of left female breast: Secondary | ICD-10-CM

## 2014-09-18 NOTE — Patient Instructions (Signed)
The patient is aware to call back for any questions or concerns.  

## 2014-09-18 NOTE — Progress Notes (Signed)
Here today for postoperative visit, left breast lumpectomy and SN biopsy on 09-05-14 for invasive ductal carcinoma and DCIS. Staging T1aN0M0. Margins were clear. Lymph nodes had no micro/macro metastasis. CED was completed intraoperatively and margin was >1cm. Pathology report ER/PR positive. HER2 negative.  She states she is doing well, minimal pain. Incisions are clean and healing well.  Discussed follow up treatment to include radiation therapy followed by antihormonal therapy. Discussed the possibility of short duration radiation with MammoSite placement. [place] completed and 1.65cm margin from pocket. Patient's questions answered. Discussed procedure for MammoSite placement and care following procedure. Follow up for placement after appointment with Dr. Baruch Gouty.   Patient is scheduled for an appointment with Dr Baruch Gouty on 09/19/14 at 9:00 am.

## 2014-09-19 ENCOUNTER — Ambulatory Visit
Admission: RE | Admit: 2014-09-19 | Discharge: 2014-09-19 | Disposition: A | Discharge status: Home / Self Care | Payer: Medicare Other | Source: Ambulatory Visit | Attending: Radiation Oncology | Admitting: Radiation Oncology

## 2014-09-19 ENCOUNTER — Encounter: Payer: Self-pay | Admitting: Radiation Oncology

## 2014-09-19 ENCOUNTER — Telehealth: Payer: Self-pay | Admitting: *Deleted

## 2014-09-19 VITALS — BP 159/76 | HR 62 | Temp 96.7°F | Resp 20 | Wt 192.0 lb

## 2014-09-19 DIAGNOSIS — C50512 Malignant neoplasm of lower-outer quadrant of left female breast: Secondary | ICD-10-CM | POA: Insufficient documentation

## 2014-09-19 DIAGNOSIS — C50912 Malignant neoplasm of unspecified site of left female breast: Secondary | ICD-10-CM

## 2014-09-19 MED ORDER — CEFADROXIL 500 MG PO CAPS: 500.0000 mg | ORAL_CAPSULE | Freq: Two times a day (BID) | ORAL | Status: DC

## 2014-09-19 NOTE — Consult Note (Signed)
Radiation Oncology NEW PATIENT EVALUATION  Name: Lindsey Bentley  MRN: [id number]  Date:   09/19/2014     DOB: August 01, 1945   This 69 y.o. female patient presents to the clinic for initial evaluation of breast cancer.  REFERRING PHYSICIAN: Jamal Collin, Seeplaputhur G,*  CHIEF COMPLAINT:  Chief Complaint  Patient presents with  . Breast Cancer    Initial consultation of radiation therapy for breast cancer.      DIAGNOSIS: The encounter diagnosis was Malignant neoplasm of female breast, left. stage IA (T1 N0 M0) ER/PR strongly positive status HER-2/neu not overexpressed PREVIOUS INVESTIGATIONS:  Mammograms ultrasound reviewed Pathology reports reviewed Clinical notes reviewed  HPI: Patient is a pleasant 69 year old female who presented 08/16/2014 at [facility] for a mammogram showing abnormal calcifications in the lower outer quadrant of the left breast linear and fine pleomorphic for which stereotactic guided C was recommended and performed. Biopsy was positive for invasive mammary carcinoma. Patient went on to have a wide local excision and sentinel node biopsy. 2 sentinel lymph nodes were negative for metastatic disease. Tumor was 0.5 cm in greatest dimension. Margins were clear at less than 1 mm. Tumor was overall grade 2. Tumor was strongly ER/PR positive HER-2/neu not overexpressed. Patient has been informed of adjuvant treatment including aromatase inhibitor therapy as well as radiation therapy. She is leaning towards accelerated partial breast irradiation. She is doing well specifically denies breast tenderness cough or bone pain.  PLANNED TREATMENT REGIMEN: Attempted accelerated partial breast irradiation  PAST MEDICAL HISTORY:  has a past medical history of Depression; History of chicken pox; Glaucoma; GERD (gastroesophageal reflux disease); Hyperlipidemia; Hypertension; Peripheral vascular disease; Diffuse cystic mastopathy (2013); High cholesterol; Pneumonia (March 2014); Cancer  (09-05-14); and Breast cancer.    PAST SURGICAL HISTORY:  Past Surgical History  Procedure Laterality Date  . Cholecystectomy  2006  . Leg stent  2011  . Back surgery  1986    ruptured disc  . Dilation and curettage of uterus    . Colonoscopy  2010    Dr. Jamal Collin  . Mole removal  2013    15 removed  . Breast biopsy  2008  . Breast biopsy Left 08-20-14  . Breast surgery Left 09-05-14    left breast lumpectomy with SLN biopsy, ER/PR positive. HER2 negative.    FAMILY HISTORY: family history includes Breast cancer in her maternal aunt; Cancer in her father; Heart disease in her other; Hyperlipidemia in her other; Hypertension in her other; Miscarriages / Stillbirths in her other.  SOCIAL HISTORY:  reports that she has never smoked. She has never used smokeless tobacco. She reports that she does not drink alcohol or use illicit drugs.  ALLERGIES: Codeine and Prednisone  MEDICATIONS:  Current Outpatient Prescriptions  Medication Sig Dispense Refill  . aspirin (BAYER ASPIRIN) 325 MG tablet Take 325 mg by mouth daily.    . cefadroxil (DURICEF) 500 MG capsule Take 1 capsule (500 mg total) by mouth 2 (two) times daily. Start one hour before office procedure on 09-24-14 24 capsule 0  . ezetimibe-simvastatin (VYTORIN) 10-20 MG per tablet Take 1 tablet by mouth daily. 30 tablet 5  . losartan-hydrochlorothiazide (HYZAAR) 100-25 MG per tablet TAKE 1 TABLET BY MOUTH DAILY. 90 tablet 1  . metoprolol (LOPRESSOR) 50 MG tablet Take 1 tablet (50 mg total) by mouth daily. 30 tablet 5  . vitamin E 400 UNIT capsule Take 400 Units by mouth daily.     No current facility-administered medications for this encounter.  ECOG PERFORMANCE STATUS:  0 - Asymptomatic  REVIEW OF SYSTEMS:  Patient denies any weight loss, fatigue, weakness, fever, chills or night sweats. Patient denies any loss of vision, blurred vision. Patient denies any ringing  of the ears or hearing loss. No irregular heartbeat. Patient  denies heart murmur or history of fainting. Patient denies any chest pain or pain radiating to her upper extremities. Patient denies any shortness of breath, difficulty breathing at night, cough or hemoptysis. Patient denies any swelling in the lower legs. Patient denies any nausea vomiting, vomiting of blood, or coffee ground material in the vomitus. Patient denies any stomach pain. Patient states has had normal bowel movements no significant constipation or diarrhea. Patient denies any dysuria, hematuria or significant nocturia. Patient denies any problems walking, swelling in the joints or loss of balance. Patient denies any skin changes, loss of hair or loss of weight. Patient denies any excessive worrying or anxiety or significant depression. Patient denies any problems with insomnia. Patient denies excessive thirst, polyuria, polydipsia. Patient denies any swollen glands, patient denies easy bruising or easy bleeding. Patient denies any recent infections, allergies or URI. Patient "s visual fields have not changed significantly in recent time.    PHYSICAL EXAM: BP 159/76 mmHg  Pulse 62  Temp(Src) 96.7 F (35.9 C)  Resp 20  Wt 192 lb 0.3 oz (87.1 kg) Well-developed well-nourished female in NAD. She status post wide local excision of the left breast which is healing well. No dominant mass or nodularity is noted in either breast in 2 positions examined. No axillary or supraclavicular adenopathy is appreciated. Lungs are clear to A&P cardiac examination shows regular rate and rhythm. Well-developed well-nourished patient in NAD. HEENT reveals PERLA, EOMI, discs not visualized.  Oral cavity is clear. No oral mucosal lesions are identified. Neck is clear without evidence of cervical or supraclavicular adenopathy. Lungs are clear to A&P. Cardiac examination is essentially unremarkable with regular rate and rhythm without murmur rub or thrill. Abdomen is benign with no organomegaly or masses noted. Motor  sensory and DTR levels are equal and symmetric in the upper and lower extremities. Cranial nerves II through XII are grossly intact. Proprioception is intact. No peripheral adenopathy or edema is identified. No motor or sensory levels are noted. Crude visual fields are within normal range.   LABORATORY DATA:  No results found for this or any previous visit (from the past 72 hour(s)).   RADIOLOGY RESULTS: No results found.  IMPRESSION: Pathologic stage I invasive mammary carcinoma of the left breast as was wide local excision and sentinel node biopsy ER/PR positive HER-2/neu negative in 69 year old female for accelerated partial breast irradiation and placement of MammoSite catheter  PLAN: I discussed the case personally with surgeon. Based the patient's age overall excellent prognostic features believe she would be an excellent candidate for accelerated partial breast irradiation. We have discussed placement of the MammoSite catheter. I have gone over risks and benefits of both whole breast radiation with external beam as well as high dose rate remote afterloading through the MammoSite catheter. Would plan on delivering 3400 cGy in 10 fractions at 340 C twice a day. Risks and benefits of both treatment including skin reaction, fatigue, thickening of the breast, minimal exposure to lung. And chance of infection with catheter placement all were discussed in detail with the patient. Nurse navigator was present during the discussion. We have set her up for catheter placement as well as CT scan for BrachyVision treatment planning. Should we not be  able to treat based on catheter placement will go to whole breast radiation. Patient will also be candidate for aromatase inhibitor therapy after completion of radiation.  I would like to take this opportunity for allowing me to participate in the care of your patient.Armstead Peaks., MD

## 2014-09-19 NOTE — Telephone Encounter (Signed)
Mammosite schedule reviewed with the patient Placement 09-24-14 at 1:45  at [facility] Scan 09-26-14 Treat 5-23 to [place] will be calling her for more details Aware of ATB and directions reviewed. Aware no showers and to wear her bra while mammosite in place. Pt agrees.

## 2014-09-24 ENCOUNTER — Ambulatory Visit (INDEPENDENT_AMBULATORY_CARE_PROVIDER_SITE_OTHER): Payer: Medicare Other | Admitting: General Surgery

## 2014-09-24 ENCOUNTER — Ambulatory Visit: Payer: Medicare Other

## 2014-09-24 ENCOUNTER — Telehealth: Payer: Self-pay | Admitting: *Deleted

## 2014-09-24 VITALS — BP 142/70 | HR 76 | Resp 12 | Ht 65.0 in | Wt 194.0 lb

## 2014-09-24 DIAGNOSIS — C50512 Malignant neoplasm of lower-outer quadrant of left female breast: Secondary | ICD-10-CM

## 2014-09-24 NOTE — Telephone Encounter (Signed)
Patient's mammosite placement has been rescheduled to 10-01-14 at 9 am.   This patient is aware of date, time, and instructions.   Lindsey Bentley from the [facility] to contact the patient. They will scan patient on Wednesday, 10-03-14 and plan to treat on Thursday, 10-04-14.

## 2014-09-24 NOTE — Patient Instructions (Addendum)
Continue with Keflex call for any worsening of left areolar swelling.

## 2014-09-24 NOTE — Progress Notes (Signed)
[name], female   DOB: 1945/07/10, 69 y.o.   MRN: [id number]  Chief Complaint  Patient presents with  . Procedure    left beast mammogram placement    HPI Lindsey Bentley is a 69 y.o. female here today for a left mammosite placement. She noted a swellin near left nipple area HPI  Past Medical History  Diagnosis Date  . Depression   . History of chicken pox   . Glaucoma   . GERD (gastroesophageal reflux disease)   . Hyperlipidemia   . Hypertension   . Peripheral vascular disease   . Diffuse cystic mastopathy 2013  . High cholesterol   . Pneumonia March 2014  . Cancer 09-05-14    breast  . Breast cancer     Past Surgical History  Procedure Laterality Date  . Cholecystectomy  2006  . Leg stent  2011  . Back surgery  1986    ruptured disc  . Dilation and curettage of uterus    . Colonoscopy  2010    Dr. Jamal Collin  . Mole removal  2013    15 removed  . Breast biopsy  2008  . Breast biopsy Left 08-20-14  . Breast surgery Left 09-05-14    left breast lumpectomy with SLN biopsy, ER/PR positive. HER2 negative.    Family History  Problem Relation Age of Onset  . Cancer Father     Prostate  . Hyperlipidemia Other     Parent  . Miscarriages / Stillbirths Other     Parent  . Hypertension Other     parent  . Heart disease Other     Parent  . Breast cancer Maternal Aunt     Social History History  Substance Use Topics  . Smoking status: Never Smoker   . Smokeless tobacco: Never Used  . Alcohol Use: No    Allergies  Allergen Reactions  . Codeine Nausea And Vomiting  . Prednisone Swelling    Current Outpatient Prescriptions  Medication Sig Dispense Refill  . aspirin (BAYER ASPIRIN) 325 MG tablet Take 325 mg by mouth daily.    . cefadroxil (DURICEF) 500 MG capsule Take 1 capsule (500 mg total) by mouth 2 (two) times daily. Start one hour before office procedure on 09-24-14 24 capsule 0  . ezetimibe-simvastatin (VYTORIN) 10-20 MG per tablet Take 1  tablet by mouth daily. 30 tablet 5  . losartan-hydrochlorothiazide (HYZAAR) 100-25 MG per tablet TAKE 1 TABLET BY MOUTH DAILY. 90 tablet 1  . metoprolol (LOPRESSOR) 50 MG tablet Take 1 tablet (50 mg total) by mouth daily. 30 tablet 5  . vitamin E 400 UNIT capsule Take 400 Units by mouth daily.     No current facility-administered medications for this visit.    Review of Systems Review of Systems  Constitutional: Negative.   Respiratory: Negative.   Cardiovascular: Negative.     Blood pressure 142/70, pulse 76, resp. rate 12, height $RemoveBe'5\' 5"'IRrABnnnR$  (1.651 m), weight 194 lb (87.998 kg).  Physical Exam Physical Exam Mild induration with sutly pinkis skin color at left areola. Thi is likely fro subareolar injections for SN identification.  Data Reviewed None   Assessment    Due to possible infection in areolar region will continue Rx with Keflex. Postpone mammosite for 1 week. This was also discussed with Dr. Baruch Gouty from radiation oncology     Plan    Patient to return next week for mammosite placement.        Chez Bulnes G  09/25/2014, 4:47 PM

## 2014-09-25 ENCOUNTER — Encounter: Payer: Self-pay | Admitting: General Surgery

## 2014-09-26 ENCOUNTER — Ambulatory Visit: Payer: Self-pay | Admitting: General Surgery

## 2014-09-26 ENCOUNTER — Ambulatory Visit: Admission: RE | Admit: 2014-09-26 | Payer: Medicare Other | Source: Ambulatory Visit

## 2014-09-26 ENCOUNTER — Encounter: Payer: Self-pay | Admitting: General Surgery

## 2014-09-26 ENCOUNTER — Ambulatory Visit: Payer: Medicare Other

## 2014-09-26 ENCOUNTER — Telehealth: Payer: Self-pay | Admitting: *Deleted

## 2014-09-26 ENCOUNTER — Ambulatory Visit (INDEPENDENT_AMBULATORY_CARE_PROVIDER_SITE_OTHER): Payer: Medicare Other | Admitting: General Surgery

## 2014-09-26 VITALS — BP 140/88 | HR 68 | Temp 97.3°F | Resp 14 | Ht 65.5 in | Wt 193.0 lb

## 2014-09-26 DIAGNOSIS — C50512 Malignant neoplasm of lower-outer quadrant of left female breast: Secondary | ICD-10-CM

## 2014-09-26 NOTE — Patient Instructions (Signed)
Follow up as scheduled for Monday with mammosite placement.  Heating pad and aleve as needed for comfort.

## 2014-09-26 NOTE — Progress Notes (Signed)
Here today for left breast pain that radiates up into her neck and occasionally central chest pain. She states yesterday the pain was worse than today. She is currently on Duricef.  Marked improvement in redness around left nipple.  Follow up as scheduled for Monday with mammosite placement.  Heating pad and aleve as needed for comfort.   PCP:  Einar Pheasant

## 2014-09-26 NOTE — Telephone Encounter (Signed)
Patient is still having pain in her left breast. Now she said the pain in moved to her chest and up to her neck. She doesn't have a fever or chills, has felt a little sick this morning. She wants you to call her to see what she should do.

## 2014-10-01 ENCOUNTER — Other Ambulatory Visit: Payer: Self-pay

## 2014-10-01 ENCOUNTER — Ambulatory Visit: Payer: Medicare Other

## 2014-10-01 ENCOUNTER — Encounter: Payer: Self-pay | Admitting: General Surgery

## 2014-10-01 ENCOUNTER — Ambulatory Visit (INDEPENDENT_AMBULATORY_CARE_PROVIDER_SITE_OTHER): Payer: Medicare Other | Admitting: General Surgery

## 2014-10-01 VITALS — BP 138/70 | HR 76 | Resp 13 | Ht 65.0 in

## 2014-10-01 DIAGNOSIS — C50512 Malignant neoplasm of lower-outer quadrant of left female breast: Secondary | ICD-10-CM

## 2014-10-01 LAB — SURGICAL PATHOLOGY

## 2014-10-01 NOTE — Patient Instructions (Signed)

## 2014-10-01 NOTE — Progress Notes (Signed)
[name], female   DOB: 1945/07/21, 69 y.o.   MRN: [id number]  Chief Complaint  Patient presents with  . Procedure    left mammosite    HPI Lindsey Bentley is a 68 y.o. female here today for a left mammosite placement. She is doing well. She has 5 days left of her Duricef. She states that the hard area is still there on her left breast.  HPI  Past Medical History  Diagnosis Date  . Depression   . History of chicken pox   . Glaucoma   . GERD (gastroesophageal reflux disease)   . Hyperlipidemia   . Hypertension   . Peripheral vascular disease   . Diffuse cystic mastopathy 2013  . High cholesterol   . Pneumonia March 2014  . Cancer 09-05-14    breast  . Breast cancer     Past Surgical History  Procedure Laterality Date  . Cholecystectomy  2006  . Leg stent  2011  . Back surgery  1986    ruptured disc  . Dilation and curettage of uterus    . Colonoscopy  2010    Dr. Evette Cristal  . Mole removal  2013    15 removed  . Breast biopsy  2008  . Breast biopsy Left 08-20-14  . Breast surgery Left 09-05-14    left breast lumpectomy with SLN biopsy, ER/PR positive. HER2 negative.    Family History  Problem Relation Age of Onset  . Cancer Father     Prostate  . Hyperlipidemia Other     Parent  . Miscarriages / Stillbirths Other     Parent  . Hypertension Other     parent  . Heart disease Other     Parent  . Breast cancer Maternal Aunt     Social History History  Substance Use Topics  . Smoking status: Never Smoker   . Smokeless tobacco: Never Used  . Alcohol Use: No    Allergies  Allergen Reactions  . Codeine Nausea And Vomiting  . Prednisone Swelling    Current Outpatient Prescriptions  Medication Sig Dispense Refill  . aspirin (BAYER ASPIRIN) 325 MG tablet Take 325 mg by mouth daily.    . cefadroxil (DURICEF) 500 MG capsule Take 1 capsule (500 mg total) by mouth 2 (two) times daily. Start one hour before office procedure on 09-24-14 24 capsule 0   . ezetimibe-simvastatin (VYTORIN) 10-20 MG per tablet Take 1 tablet by mouth daily. 30 tablet 5  . losartan-hydrochlorothiazide (HYZAAR) 100-25 MG per tablet TAKE 1 TABLET BY MOUTH DAILY. 90 tablet 1  . metoprolol (LOPRESSOR) 50 MG tablet Take 1 tablet (50 mg total) by mouth daily. 30 tablet 5  . vitamin E 400 UNIT capsule Take 400 Units by mouth daily.     No current facility-administered medications for this visit.    Review of Systems Review of Systems  Constitutional: Negative.   Respiratory: Negative.   Cardiovascular: Negative.     Blood pressure 138/70, pulse 76, resp. rate 13, height 5\' 5"  (1.651 m).  Physical Exam Physical Exam The mild thickening in the left subareolar region is still there but the redness is gone. In view of this it was controlled with consent of the patient did not have any significant infection and that a MammoSite could be placed safely. Data Reviewed    Assessment    Ca left breast loq     Plan    EXCISIONAL BREAST BIOPSY REPORT  Name:  Lindsey Bentley DOB:  [birth year]  Vital signs:BP 138/70 mmHg  Pulse 76  Resp 13  Ht $R'5\' 5"'Qm$  (1.651 m)  The patient has been found to be an acceptable candidate for MammoSite radiation treatment for her recently diagnosed breast cancer of the   Left breast after consultation with Noreene Filbert, M.D. from radiation oncology.  The patient returns today for planned insertion of a treatment balloon.  Pre-procedure ultrasound has shown an adequate buffer between the skin and the underlying cavity from her wide excision.  A total of 10cc of 1% Xylocaine with 0.5% Marcaine was used for local anesthesia and was well tolerated.  The breast was then prepped with Chloro prep and draped. Under ultrasound guidance the 4-5 centimeter   MammoSite balloon was inserted through the small incision used for CED at time of surgery.  This was inflated to a volume of 35cc with normal saline mixed with Omnipaque. Skin to balloon  distance  Is 0.8cm at the shallowest area.  The procedure was well tolerated.  Scant bleeding was noted.  Antibiotic cream was placed at the insertion site and a gauze pad applied.  She has an appointment with radiation oncology staff for a CT scan of the breast to confirm balloon size and skin margins in the near future.  CC:  Einar Pheasant, MD CC: Noreene Filbert, M.D.  Lindsey Bentley        SANKAR,SEEPLAPUTHUR G 10/01/2014, 10:44 AM

## 2014-10-02 ENCOUNTER — Ambulatory Visit: Payer: Medicare Other | Admitting: General Surgery

## 2014-10-02 ENCOUNTER — Other Ambulatory Visit: Payer: Self-pay

## 2014-10-03 ENCOUNTER — Ambulatory Visit
Admission: RE | Admit: 2014-10-03 | Discharge: 2014-10-03 | Disposition: A | Discharge status: Home / Self Care | Payer: Medicare Other | Source: Ambulatory Visit | Attending: Radiation Oncology | Admitting: Radiation Oncology

## 2014-10-03 ENCOUNTER — Other Ambulatory Visit: Payer: Self-pay

## 2014-10-03 ENCOUNTER — Other Ambulatory Visit: Payer: Self-pay | Admitting: *Deleted

## 2014-10-03 DIAGNOSIS — C50512 Malignant neoplasm of lower-outer quadrant of left female breast: Secondary | ICD-10-CM | POA: Diagnosis not present

## 2014-10-03 DIAGNOSIS — Z510 Encounter for antineoplastic radiation therapy: Secondary | ICD-10-CM | POA: Diagnosis present

## 2014-10-03 MED ORDER — CEFADROXIL 500 MG PO CAPS: 500.0000 mg | ORAL_CAPSULE | Freq: Two times a day (BID) | ORAL | Status: DC

## 2014-10-03 NOTE — Telephone Encounter (Signed)
She will need  5 more days ATB's please.

## 2014-10-04 ENCOUNTER — Ambulatory Visit
Admission: RE | Admit: 2014-10-04 | Discharge: 2014-10-04 | Disposition: A | Discharge status: Home / Self Care | Payer: Medicare Other | Source: Ambulatory Visit | Attending: Radiation Oncology | Admitting: Radiation Oncology

## 2014-10-04 ENCOUNTER — Other Ambulatory Visit: Payer: Self-pay

## 2014-10-04 DIAGNOSIS — C50512 Malignant neoplasm of lower-outer quadrant of left female breast: Secondary | ICD-10-CM | POA: Diagnosis not present

## 2014-10-05 ENCOUNTER — Ambulatory Visit
Admission: RE | Admit: 2014-10-05 | Discharge: 2014-10-05 | Disposition: A | Discharge status: Home / Self Care | Payer: Medicare Other | Source: Ambulatory Visit | Attending: Radiation Oncology | Admitting: Radiation Oncology

## 2014-10-05 ENCOUNTER — Other Ambulatory Visit: Payer: Self-pay

## 2014-10-05 DIAGNOSIS — C50512 Malignant neoplasm of lower-outer quadrant of left female breast: Secondary | ICD-10-CM | POA: Diagnosis not present

## 2014-10-09 ENCOUNTER — Ambulatory Visit
Admission: RE | Admit: 2014-10-09 | Discharge: 2014-10-09 | Disposition: A | Discharge status: Home / Self Care | Payer: Medicare Other | Source: Ambulatory Visit | Attending: Radiation Oncology | Admitting: Radiation Oncology

## 2014-10-09 DIAGNOSIS — C50512 Malignant neoplasm of lower-outer quadrant of left female breast: Secondary | ICD-10-CM | POA: Diagnosis not present

## 2014-10-10 ENCOUNTER — Ambulatory Visit
Admission: RE | Admit: 2014-10-10 | Discharge: 2014-10-10 | Disposition: A | Discharge status: Home / Self Care | Payer: Medicare Other | Source: Ambulatory Visit | Attending: Radiation Oncology | Admitting: Radiation Oncology

## 2014-10-10 DIAGNOSIS — C50512 Malignant neoplasm of lower-outer quadrant of left female breast: Secondary | ICD-10-CM | POA: Diagnosis not present

## 2014-10-11 ENCOUNTER — Ambulatory Visit
Admission: RE | Admit: 2014-10-11 | Discharge: 2014-10-11 | Disposition: A | Discharge status: Home / Self Care | Payer: Medicare Other | Source: Ambulatory Visit | Attending: Radiation Oncology | Admitting: Radiation Oncology

## 2014-10-11 DIAGNOSIS — C50512 Malignant neoplasm of lower-outer quadrant of left female breast: Secondary | ICD-10-CM | POA: Diagnosis not present

## 2014-10-16 ENCOUNTER — Ambulatory Visit: Payer: Medicare Other | Admitting: General Surgery

## 2014-10-16 ENCOUNTER — Ambulatory Visit (INDEPENDENT_AMBULATORY_CARE_PROVIDER_SITE_OTHER): Payer: Medicare Other | Admitting: General Surgery

## 2014-10-16 ENCOUNTER — Encounter: Payer: Self-pay | Admitting: General Surgery

## 2014-10-16 VITALS — BP 128/70 | HR 66 | Resp 14 | Ht 65.5 in | Wt 193.0 lb

## 2014-10-16 DIAGNOSIS — C50512 Malignant neoplasm of lower-outer quadrant of left female breast: Secondary | ICD-10-CM

## 2014-10-16 MED ORDER — LETROZOLE 2.5 MG PO TABS: 2.5000 mg | ORAL_TABLET | Freq: Every day | ORAL | Status: DC

## 2014-10-16 NOTE — Progress Notes (Signed)
[name], female   DOB: Dec 12, 1945, 69 y.o.   MRN: [id number]  Patient here for follow up on left breast mammosite placed on 10/01/14. Patient completed radiation 5 days ago. Patient states she is doing well. She states occasional pain if left breast worse at the nipple area.  Blister on left breast about 5 o'clock position about 1 cm x 1.5 cm. Covered with antibiotic ointment and bandaid. Continue to use antibiotic ointment. Lumpectomy site remains clean.  Will start antihormonal therapy-Letrazole. Take daily. Discussed risks and side effects of medication.   Follow up: 1 month  PCP:  Einar Pheasant

## 2014-10-17 ENCOUNTER — Encounter: Payer: Self-pay | Admitting: General Surgery

## 2014-10-18 ENCOUNTER — Telehealth: Payer: Self-pay | Admitting: General Surgery

## 2014-10-18 NOTE — Telephone Encounter (Signed)
PT CALLED & WAS REPORTING THE MED PRESCRIBED(LETROZOLE 2.5MG  TAKE ONE A DAY) HAS REALLY MADE HER DIZZY.SHE STATES SHE TOOK THE MEDICAINE YESTERDAY MORNING ABOUT 9:00AM & BEGAN TO FEEL THE DIZZINESS AROUND 7-8:00 PM LAST NIGHT AND WORSENED ABOUT 2-3 AM NOT ABLE TO EVEN TURN OVER IN BED. SHE IS UNABLE TO CONTINUE TO TAKE. SHE USES [facility] ON [address] IN [place]. SHE CAN BE REACHED @ [phone]

## 2014-10-26 ENCOUNTER — Telehealth: Payer: Self-pay | Admitting: *Deleted

## 2014-10-26 NOTE — Telephone Encounter (Signed)
Patient called to let you know that the medicine that she is taking Letrozole (Femara) 2.5mg  tablet is doing much better than the last time. She stated that she is still a little dizzy but nothing like the last time she took it. So other than that she is not having any problems.

## 2014-11-05 ENCOUNTER — Encounter (INDEPENDENT_AMBULATORY_CARE_PROVIDER_SITE_OTHER): Payer: Self-pay

## 2014-11-05 ENCOUNTER — Ambulatory Visit (INDEPENDENT_AMBULATORY_CARE_PROVIDER_SITE_OTHER): Payer: Medicare Other | Admitting: Internal Medicine

## 2014-11-05 ENCOUNTER — Encounter: Payer: Self-pay | Admitting: Internal Medicine

## 2014-11-05 VITALS — BP 163/76 | HR 59 | Temp 98.1°F | Ht 65.5 in | Wt 196.5 lb

## 2014-11-05 DIAGNOSIS — I25119 Atherosclerotic heart disease of native coronary artery with unspecified angina pectoris: Secondary | ICD-10-CM

## 2014-11-05 DIAGNOSIS — I10 Essential (primary) hypertension: Secondary | ICD-10-CM

## 2014-11-05 DIAGNOSIS — C50912 Malignant neoplasm of unspecified site of left female breast: Secondary | ICD-10-CM

## 2014-11-05 DIAGNOSIS — R7989 Other specified abnormal findings of blood chemistry: Secondary | ICD-10-CM | POA: Diagnosis not present

## 2014-11-05 DIAGNOSIS — Z0000 Encounter for general adult medical examination without abnormal findings: Secondary | ICD-10-CM

## 2014-11-05 DIAGNOSIS — Z658 Other specified problems related to psychosocial circumstances: Secondary | ICD-10-CM

## 2014-11-05 DIAGNOSIS — R945 Abnormal results of liver function studies: Secondary | ICD-10-CM

## 2014-11-05 DIAGNOSIS — I739 Peripheral vascular disease, unspecified: Secondary | ICD-10-CM

## 2014-11-05 DIAGNOSIS — K219 Gastro-esophageal reflux disease without esophagitis: Secondary | ICD-10-CM

## 2014-11-05 DIAGNOSIS — R739 Hyperglycemia, unspecified: Secondary | ICD-10-CM

## 2014-11-05 DIAGNOSIS — M25473 Effusion, unspecified ankle: Secondary | ICD-10-CM

## 2014-11-05 DIAGNOSIS — E041 Nontoxic single thyroid nodule: Secondary | ICD-10-CM | POA: Diagnosis not present

## 2014-11-05 DIAGNOSIS — R221 Localized swelling, mass and lump, neck: Secondary | ICD-10-CM

## 2014-11-05 DIAGNOSIS — F439 Reaction to severe stress, unspecified: Secondary | ICD-10-CM

## 2014-11-05 NOTE — Progress Notes (Signed)
[name], female   DOB: 22-Sep-1945, 69 y.o.   MRN: [id number]   Subjective:    [name], female    DOB: July 29, 1945, 69 y.o.   MRN: [id number]  HPI  Patient here for a scheduled follow up.  Recently diagnosed with breast cancer.  Is s/p xrt.  Had mammosite placed.  Seeing Dr Jamal Collin.  Still with left breast tenderness.  Has discussed with Dr Jamal Collin.  Is healing. Taking femara.  Stopped briefly.  Thought was causing her to be dizzy.  Back on now.  No dizziness now.  Was on her feet a lot this past weekend.  Feet and ankles swelling - L>R.  Due to see Dr Lucky Cowboy 11-12/16.  No pain in her legs.  Saw ENT for her neck.  Felt no further w/up warranted.  Son is doing better.  Still with increased stress, but does not feel she needs any further intervention.     Past Medical History  Diagnosis Date  . Depression   . History of chicken pox   . Glaucoma   . GERD (gastroesophageal reflux disease)   . Hyperlipidemia   . Hypertension   . Peripheral vascular disease   . Diffuse cystic mastopathy 2013  . High cholesterol   . Pneumonia March 2014  . Cancer 09-05-14    breast  . Breast cancer     Current Outpatient Prescriptions on File Prior to Visit  Medication Sig Dispense Refill  . aspirin (BAYER ASPIRIN) 325 MG tablet Take 325 mg by mouth daily.    [name] ezetimibe-simvastatin (VYTORIN) 10-20 MG per tablet Take 1 tablet by mouth daily. 30 tablet 5  . letrozole (FEMARA) 2.5 MG tablet Take 1 tablet (2.5 mg total) by mouth daily. 30 tablet 12  . losartan-hydrochlorothiazide (HYZAAR) 100-25 MG per tablet TAKE 1 TABLET BY MOUTH DAILY. 90 tablet 1  . metoprolol (LOPRESSOR) 50 MG tablet Take 1 tablet (50 mg total) by mouth daily. 30 tablet 5  . vitamin E 400 UNIT capsule Take 400 Units by mouth daily.     No current facility-administered medications on file prior to visit.    Review of Systems  Constitutional: Negative for appetite change and unexpected weight change.    HENT: Negative for congestion and sinus pressure.   Respiratory: Negative for cough, chest tightness and shortness of breath.   Cardiovascular: Negative for chest pain, palpitations and leg swelling.  Gastrointestinal: Negative for nausea, vomiting, abdominal pain and diarrhea.  Musculoskeletal: Negative for back pain and joint swelling.  Skin: Negative for color change and rash.  Neurological: Negative for dizziness, light-headedness and headaches.  Hematological: Negative for adenopathy. Does not bruise/bleed easily.  Psychiatric/Behavioral: Negative for dysphoric mood and agitation.       Objective:     Pulse recheck:  64, blood pressure recheck:  148/72  Physical Exam  Constitutional: She appears well-developed and well-nourished. No distress.  HENT:  Nose: Nose normal.  Mouth/Throat: Oropharynx is clear and moist.  Neck: Neck supple. No thyromegaly present.  Cardiovascular: Normal rate and regular rhythm.   Pulmonary/Chest: Breath sounds normal. No respiratory distress. She has no wheezes.  Abdominal: Soft. Bowel sounds are normal. There is no tenderness.  Musculoskeletal: She exhibits no edema or tenderness.  Lymphadenopathy:    She has no cervical adenopathy.  Skin: No rash noted. No erythema.  Psychiatric: She has a normal mood and affect. Her behavior is normal.    BP 163/76 mmHg  Pulse 59  Temp(Src) 98.1 F (36.7 C) (Oral)  Ht 5' 5.5" (1.664 m)  Wt 196 lb 8 oz (89.132 kg)  BMI 32.19 kg/m2  SpO2 97% Wt Readings from Last 3 Encounters:  11/05/14 196 lb 8 oz (89.132 kg)  10/16/14 193 lb (87.544 kg)  09/26/14 193 lb (87.544 kg)     Lab Results  Component Value Date   WBC 7.3 10/23/2013   HGB 13.7 10/23/2013   HCT 40.1 10/23/2013   PLT 214 10/23/2013   GLUCOSE 105* 06/27/2014   CHOL 165 06/27/2014   TRIG 135.0 06/27/2014   HDL 47.90 06/27/2014   LDLDIRECT 132.1 06/13/2013   LDLCALC 90 06/27/2014   ALT 24 06/27/2014   AST 17 06/27/2014   NA 140  06/27/2014   K 3.8 08/27/2014   CL 106 06/27/2014   CREATININE 0.90 06/27/2014   BUN 21 06/27/2014   CO2 26 06/27/2014   TSH 4.45 10/17/2013   INR 0.9 07/13/2012   HGBA1C 6.2 06/27/2014       Assessment & Plan:   Problem List Items Addressed This Visit    Abnormal liver function test    Follow liver panel.       Relevant Orders   Hepatic function panel   Ankle swelling    Elevate legs.  Follow.  Notify me if persistent problems.       Breast cancer    S/p XRT.  Mammosite.  On femara.  Seeing Dr Jamal Collin.        CAD (coronary artery disease)    Sees Dr Saralyn Pilar.        Relevant Orders   Lipid panel   GERD (gastroesophageal reflux disease)    Controlled.        Health care maintenance    Physical 06/29/14.  Mammogram being followed by Dr Jamal Collin.  On femara.  Colonoscopy 05/2005.        Hyperglycemia    Low carb diet and exercise.  Follow met b and a1c.       Relevant Orders   Hemoglobin A1c   Hypertension - Primary    Blood pressure elevated today.  Better on recheck.  Will continue same medication regimen for now.  Follow pressures.  Get her back in soon to reassess.  If persistent elevation, will need to adjust medication.       Relevant Orders   CBC with Differential/Platelet   Basic metabolic panel   Neck fullness    Saw ENT.  Felt no further w/up warranted.  Is planning to f/u with ENT.        Peripheral vascular disease    Followed by Dr Lucky Cowboy.  S/p stent placement.        Stress    Does not feel she needs anything more at this point.  Follow.       Thyroid nodule    Was followed by Dr Gabriel Carina.  Biopsy negative.  Has been released.        Relevant Orders   TSH     I spent 25 minutes with the patient and more than 50% of the time was spent in consultation regarding the above.     Einar Pheasant, MD

## 2014-11-05 NOTE — Progress Notes (Signed)
Pre visit review using our clinic review tool, if applicable. No additional management support is needed unless otherwise documented below in the visit note. 

## 2014-11-06 ENCOUNTER — Encounter: Payer: Self-pay | Admitting: Internal Medicine

## 2014-11-06 DIAGNOSIS — Z853 Personal history of malignant neoplasm of breast: Secondary | ICD-10-CM | POA: Insufficient documentation

## 2014-11-06 DIAGNOSIS — Z0000 Encounter for general adult medical examination without abnormal findings: Secondary | ICD-10-CM | POA: Insufficient documentation

## 2014-11-06 DIAGNOSIS — M25473 Effusion, unspecified ankle: Secondary | ICD-10-CM | POA: Insufficient documentation

## 2014-11-06 DIAGNOSIS — F439 Reaction to severe stress, unspecified: Secondary | ICD-10-CM | POA: Insufficient documentation

## 2014-11-06 DIAGNOSIS — C50919 Malignant neoplasm of unspecified site of unspecified female breast: Secondary | ICD-10-CM | POA: Insufficient documentation

## 2014-11-06 NOTE — Assessment & Plan Note (Signed)
Was followed by Dr Gabriel Carina.  Biopsy negative.  Has been released.

## 2014-11-06 NOTE — Assessment & Plan Note (Signed)
Follow liver panel.  

## 2014-11-06 NOTE — Assessment & Plan Note (Signed)
Elevate legs.  Follow.  Notify me if persistent problems.

## 2014-11-06 NOTE — Assessment & Plan Note (Signed)
Does not feel she needs anything more at this point.  Follow.

## 2014-11-06 NOTE — Assessment & Plan Note (Signed)
Blood pressure elevated today.  Better on recheck.  Will continue same medication regimen for now.  Follow pressures.  Get her back in soon to reassess.  If persistent elevation, will need to adjust medication.

## 2014-11-06 NOTE — Assessment & Plan Note (Signed)
Saw ENT.  Felt no further w/up warranted.  Is planning to f/u with ENT.

## 2014-11-06 NOTE — Assessment & Plan Note (Signed)
Sees Dr Saralyn Pilar.

## 2014-11-06 NOTE — Assessment & Plan Note (Signed)
Followed by Dr Lucky Cowboy.  S/p stent placement.

## 2014-11-06 NOTE — Assessment & Plan Note (Signed)
Controlled.  

## 2014-11-06 NOTE — Assessment & Plan Note (Signed)
S/p XRT.  Mammosite.  On femara.  Seeing Dr Jamal Collin.

## 2014-11-06 NOTE — Assessment & Plan Note (Signed)
Physical 06/29/14.  Mammogram being followed by Dr Jamal Collin.  On femara.  Colonoscopy 05/2005.

## 2014-11-06 NOTE — Assessment & Plan Note (Signed)
Low carb diet and exercise.  Follow met b and a1c.  

## 2014-11-14 ENCOUNTER — Encounter: Payer: Self-pay | Admitting: General Surgery

## 2014-11-14 ENCOUNTER — Ambulatory Visit (INDEPENDENT_AMBULATORY_CARE_PROVIDER_SITE_OTHER): Payer: Medicare Other | Admitting: General Surgery

## 2014-11-14 VITALS — BP 134/78 | HR 68 | Resp 14 | Ht 65.0 in | Wt 196.0 lb

## 2014-11-14 DIAGNOSIS — C50512 Malignant neoplasm of lower-outer quadrant of left female breast: Secondary | ICD-10-CM

## 2014-11-14 NOTE — Progress Notes (Signed)
[name], female   DOB: January 24, 1946, 69 y.o.   MRN: [id number]  Chief Complaint  Patient presents with  . Follow-up    left breast cancer    HPI Lindsey Bentley is a 69 y.o. female Patient here for follow up on left breast cancer. She state that she is doing well, just some discomfort with the left breast.  HPI  Past Medical History  Diagnosis Date  . Depression   . History of chicken pox   . Glaucoma   . GERD (gastroesophageal reflux disease)   . Hyperlipidemia   . Hypertension   . Peripheral vascular disease   . Diffuse cystic mastopathy 2013  . High cholesterol   . Pneumonia March 2014  . Cancer 09-05-14    breast  . Breast cancer     Past Surgical History  Procedure Laterality Date  . Cholecystectomy  2006  . Leg stent  2011  . Back surgery  1986    ruptured disc  . Dilation and curettage of uterus    . Colonoscopy  2010    Dr. Jamal Bentley  . Mole removal  2013    15 removed  . Breast biopsy  2008  . Breast biopsy Left 08-20-14  . Breast surgery Left 09-05-14    left breast lumpectomy with SLN biopsy, ER/PR positive. HER2 negative.    Family History  Problem Relation Age of Onset  . Cancer Father     Prostate  . Hyperlipidemia Other     Parent  . Miscarriages / Stillbirths Other     Parent  . Hypertension Other     parent  . Heart disease Other     Parent  . Breast cancer Maternal Aunt     Social History History  Substance Use Topics  . Smoking status: Never Smoker   . Smokeless tobacco: Never Used  . Alcohol Use: No    Allergies  Allergen Reactions  . Codeine Nausea And Vomiting  . Prednisone Swelling    Current Outpatient Prescriptions  Medication Sig Dispense Refill  . aspirin (BAYER ASPIRIN) 325 MG tablet Take 325 mg by mouth daily.    [name] ezetimibe-simvastatin (VYTORIN) 10-20 MG per tablet Take 1 tablet by mouth daily. 30 tablet 5  . letrozole (FEMARA) 2.5 MG tablet Take 1 tablet (2.5 mg total) by mouth daily. 30 tablet 12   . losartan-hydrochlorothiazide (HYZAAR) 100-25 MG per tablet TAKE 1 TABLET BY MOUTH DAILY. 90 tablet 1  . metoprolol (LOPRESSOR) 50 MG tablet Take 1 tablet (50 mg total) by mouth daily. 30 tablet 5  . vitamin E 400 UNIT capsule Take 400 Units by mouth daily.     No current facility-administered medications for this visit.    Review of Systems Review of Systems  Blood pressure 134/78, pulse 68, resp. rate 14, height $RemoveBe'5\' 5"'pIMFDQTdL$  (1.651 m), weight 196 lb (88.905 kg).  Physical Exam Physical Exam  Constitutional: She is oriented to person, place, and time. She appears well-developed and well-nourished.  Eyes: Conjunctivae are normal. No scleral icterus.  Neck: Neck supple.  Pulmonary/Chest: Right breast exhibits no inverted nipple, no mass, no nipple discharge, no skin change and no tenderness. Left breast exhibits no inverted nipple, no mass, no nipple discharge, no skin change and no tenderness. Breasts are symmetrical.  Lumpectomy site of left breast lower outer quadrant is well healed  Lymphadenopathy:    She has no cervical adenopathy.    She has no axillary adenopathy.  Neurological:  She is alert and oriented to person, place, and time.  Skin: Skin is warm and dry.  Psychiatric: She has a normal mood and affect.    Data Reviewed Prior notes  Assessment    Stage 1 left breast cancer 3 mos post lumpectomy, mammosite radiation. Currently on Letrozole and doing well with it.      Plan    Follow up: 3 months for left diagnostic mammogram.     PCP: Dr Lindsey Bentley 11/14/2014, 6:26 PM

## 2014-11-15 ENCOUNTER — Other Ambulatory Visit: Payer: Self-pay | Admitting: *Deleted

## 2014-11-15 ENCOUNTER — Encounter: Payer: Self-pay | Admitting: Radiation Oncology

## 2014-11-15 ENCOUNTER — Ambulatory Visit
Admission: RE | Admit: 2014-11-15 | Discharge: 2014-11-15 | Disposition: A | Discharge status: Home / Self Care | Payer: Medicare Other | Source: Ambulatory Visit | Attending: Radiation Oncology | Admitting: Radiation Oncology

## 2014-11-15 VITALS — BP 154/75 | HR 66 | Temp 89.1°F | Resp 20 | Wt 196.3 lb

## 2014-11-15 DIAGNOSIS — C50912 Malignant neoplasm of unspecified site of left female breast: Secondary | ICD-10-CM

## 2014-11-15 NOTE — Progress Notes (Signed)
Survivorship visit- Survivorship visit completed. Survivorship Care plan given and reviewed with patients. [facility] answers to Survivorship Care booklet given and reviewed with patient. Resources given about CARE program and Cancer Transitions. Patient verbalized understanding. 

## 2014-11-15 NOTE — Progress Notes (Signed)
Radiation Oncology Follow up Note  Name: Lindsey Bentley   Date:   11/15/2014 MRN:  [id number] DOB: [birth year]    This 69 y.o. female presents to the clinic today for follow-up for breast cancer stage IA (T1 N0 M0) ER/PR strongly positive year HER-2/neu not overexpressed.status post high dose rate remote afterloading for accelerated partial breast radiation to left breast now seen out 1 month.she is currently on letrozole and tolerating that well without side effect. She specifically denies breast tenderness cough or bone pain.  REFERRING PROVIDER: Einar Pheasant, MD  HPI: patient is a 69 year old female now 1 month out having completed accelerated partial breast radiation to her left breast for a stage I a ER/PR positive HER-2/neu negative invasive mammary carcinoma..  COMPLICATIONS OF TREATMENT: none  FOLLOW UP COMPLIANCE: keeps appointments   PHYSICAL EXAM:  BP 154/75 mmHg  Pulse 66  Temp(Src) 89.1 F (31.7 C)  Resp 20  Wt 196 lb 5.1 oz (89.05 kg) Lungs are clear to A&P cardiac examination essentially unremarkable with regular rate and rhythm. No dominant mass or nodularity is noted in either breast in 2 positions examined. Incision is well-healed. No axillary or supraclavicular adenopathy is appreciated. Cosmetic result is excellent. Well-developed well-nourished patient in NAD. HEENT reveals PERLA, EOMI, discs not visualized.  Oral cavity is clear. No oral mucosal lesions are identified. Neck is clear without evidence of cervical or supraclavicular adenopathy. Lungs are clear to A&P. Cardiac examination is essentially unremarkable with regular rate and rhythm without murmur rub or thrill. Abdomen is benign with no organomegaly or masses noted. Motor sensory and DTR levels are equal and symmetric in the upper and lower extremities. Cranial nerves II through XII are grossly intact. Proprioception is intact. No peripheral adenopathy or edema is identified. No motor or sensory levels are  noted. Crude visual fields are within normal range.   RADIOLOGY RESULTS: no current radiology reports to review  PLAN: at the present time she is doing well 1 month out with no significant side effects or complaints. I am please were overall progress. She continues on letrozole. I've asked to see her back in 5-6 months for follow-up. She continues close follow-up care with surgeon. I would like to take this opportunity for allowing me to participate in the care of your patient.Armstead Peaks., MD

## 2014-11-16 DIAGNOSIS — G4733 Obstructive sleep apnea (adult) (pediatric): Secondary | ICD-10-CM | POA: Diagnosis not present

## 2014-12-17 DIAGNOSIS — G4733 Obstructive sleep apnea (adult) (pediatric): Secondary | ICD-10-CM | POA: Diagnosis not present

## 2014-12-20 ENCOUNTER — Other Ambulatory Visit: Payer: Self-pay | Admitting: Internal Medicine

## 2015-01-01 ENCOUNTER — Other Ambulatory Visit (INDEPENDENT_AMBULATORY_CARE_PROVIDER_SITE_OTHER): Payer: Medicare Other

## 2015-01-01 DIAGNOSIS — I25119 Atherosclerotic heart disease of native coronary artery with unspecified angina pectoris: Secondary | ICD-10-CM

## 2015-01-01 DIAGNOSIS — E041 Nontoxic single thyroid nodule: Secondary | ICD-10-CM

## 2015-01-01 DIAGNOSIS — R7989 Other specified abnormal findings of blood chemistry: Secondary | ICD-10-CM | POA: Diagnosis not present

## 2015-01-01 DIAGNOSIS — I10 Essential (primary) hypertension: Secondary | ICD-10-CM | POA: Diagnosis not present

## 2015-01-01 DIAGNOSIS — R739 Hyperglycemia, unspecified: Secondary | ICD-10-CM

## 2015-01-01 DIAGNOSIS — R945 Abnormal results of liver function studies: Secondary | ICD-10-CM

## 2015-01-01 LAB — BASIC METABOLIC PANEL
BUN: 23 mg/dL (ref 6–23)
CO2: 27 mEq/L (ref 19–32)
Calcium: 9.7 mg/dL (ref 8.4–10.5)
Chloride: 104 mEq/L (ref 96–112)
Creatinine, Ser: 0.96 mg/dL (ref 0.40–1.20)
GFR: 61 mL/min (ref >60.00)
Glucose, Bld: 113 mg/dL — ABNORMAL HIGH (ref 70–99)
POTASSIUM: 4.2 meq/L (ref 3.5–5.1)
Sodium: 140 mEq/L (ref 135–145)

## 2015-01-01 LAB — CBC WITH DIFFERENTIAL/PLATELET
BASOS ABS: 0 10*3/uL (ref 0.0–0.1)
Basophils Relative: 0.4 % (ref 0.0–3.0)
EOS ABS: 0.1 10*3/uL (ref 0.0–0.7)
Eosinophils Relative: 2.1 % (ref 0.0–5.0)
HCT: 36.8 % (ref 36.0–46.0)
Hemoglobin: 12.8 g/dL (ref 12.0–15.0)
LYMPHS ABS: 1.8 10*3/uL (ref 0.7–4.0)
LYMPHS PCT: 33.9 % (ref 12.0–46.0)
MCHC: 34.8 g/dL (ref 30.0–36.0)
MCV: 93.6 fl (ref 78.0–100.0)
MONOS PCT: 8.5 % (ref 3.0–12.0)
Monocytes Absolute: 0.4 10*3/uL (ref 0.1–1.0)
Neutro Abs: 2.9 10*3/uL (ref 1.4–7.7)
Neutrophils Relative %: 55.1 % (ref 43.0–77.0)
Platelets: 209 10*3/uL (ref 150.0–400.0)
RBC: 3.94 Mil/uL (ref 3.87–5.11)
RDW: 13.1 % (ref 11.5–15.5)
WBC: 5.3 10*3/uL (ref 4.0–10.5)

## 2015-01-01 LAB — LIPID PANEL
CHOL/HDL RATIO: 4
Cholesterol: 185 mg/dL (ref 0–200)
HDL: 42.1 mg/dL (ref >39.00)
LDL Cholesterol: 108 mg/dL — ABNORMAL HIGH (ref 0–99)
NonHDL: 142.5
TRIGLYCERIDES: 174 mg/dL — AB (ref 0.0–149.0)
VLDL: 34.8 mg/dL (ref 0.0–40.0)

## 2015-01-01 LAB — HEPATIC FUNCTION PANEL
ALBUMIN: 4 g/dL (ref 3.5–5.2)
ALT: 22 U/L (ref 0–35)
AST: 17 U/L (ref 0–37)
Alkaline Phosphatase: 47 U/L (ref 39–117)
Bilirubin, Direct: 0.1 mg/dL (ref 0.0–0.3)
Total Bilirubin: 0.6 mg/dL (ref 0.2–1.2)
Total Protein: 7 g/dL (ref 6.0–8.3)

## 2015-01-01 LAB — TSH: TSH: 7.4 u[IU]/mL — ABNORMAL HIGH (ref 0.35–4.50)

## 2015-01-01 LAB — HEMOGLOBIN A1C: Hgb A1c MFr Bld: 6.2 % (ref 4.6–6.5)

## 2015-01-02 ENCOUNTER — Ambulatory Visit (INDEPENDENT_AMBULATORY_CARE_PROVIDER_SITE_OTHER): Payer: Medicare Other | Admitting: Internal Medicine

## 2015-01-02 ENCOUNTER — Encounter: Payer: Self-pay | Admitting: Internal Medicine

## 2015-01-02 ENCOUNTER — Telehealth: Payer: Self-pay | Admitting: *Deleted

## 2015-01-02 VITALS — BP 136/70 | HR 68 | Temp 98.3°F | Ht 65.0 in | Wt 197.1 lb

## 2015-01-02 DIAGNOSIS — E7800 Pure hypercholesterolemia, unspecified: Secondary | ICD-10-CM

## 2015-01-02 DIAGNOSIS — Z658 Other specified problems related to psychosocial circumstances: Secondary | ICD-10-CM

## 2015-01-02 DIAGNOSIS — R739 Hyperglycemia, unspecified: Secondary | ICD-10-CM

## 2015-01-02 DIAGNOSIS — I739 Peripheral vascular disease, unspecified: Secondary | ICD-10-CM

## 2015-01-02 DIAGNOSIS — C50912 Malignant neoplasm of unspecified site of left female breast: Secondary | ICD-10-CM

## 2015-01-02 DIAGNOSIS — R946 Abnormal results of thyroid function studies: Secondary | ICD-10-CM

## 2015-01-02 DIAGNOSIS — R945 Abnormal results of liver function studies: Secondary | ICD-10-CM

## 2015-01-02 DIAGNOSIS — E041 Nontoxic single thyroid nodule: Secondary | ICD-10-CM

## 2015-01-02 DIAGNOSIS — F439 Reaction to severe stress, unspecified: Secondary | ICD-10-CM

## 2015-01-02 DIAGNOSIS — I10 Essential (primary) hypertension: Secondary | ICD-10-CM | POA: Diagnosis not present

## 2015-01-02 DIAGNOSIS — L989 Disorder of the skin and subcutaneous tissue, unspecified: Secondary | ICD-10-CM

## 2015-01-02 DIAGNOSIS — K219 Gastro-esophageal reflux disease without esophagitis: Secondary | ICD-10-CM

## 2015-01-02 DIAGNOSIS — I25119 Atherosclerotic heart disease of native coronary artery with unspecified angina pectoris: Secondary | ICD-10-CM | POA: Diagnosis not present

## 2015-01-02 DIAGNOSIS — R7989 Other specified abnormal findings of blood chemistry: Secondary | ICD-10-CM

## 2015-01-02 MED ORDER — ROSUVASTATIN CALCIUM 10 MG PO TABS: 10.0000 mg | ORAL_TABLET | Freq: Every day | ORAL | Status: DC

## 2015-01-02 NOTE — Progress Notes (Signed)
Pre-visit discussion using our clinic review tool. No additional management support is needed unless otherwise documented below in the visit note.  

## 2015-01-02 NOTE — Progress Notes (Signed)
[name], female   DOB: 1946/02/05, 69 y.o.   MRN: [id number]   Subjective:    [name], female    DOB: April 07, 1946, 69 y.o.   MRN: [id number]  HPI  Patient here for a scheduled follow up.  We discussed her cholesterol levels.  Discussed changing her cholesterol medication.  Discussed changing to crestor.  No cardiac symptoms with increased activity or exertion.  No sob.  Blood pressure has been averaging 654-650 systolic readings.  She has several skin lesions.  Has one on her right lower leg, eye lesion and scalp lesion.  She will call with MD she wants to see.  Does not feel she needs any further intervention for the stress.     Past Medical History  Diagnosis Date  . Depression   . History of chicken pox   . Glaucoma   . GERD (gastroesophageal reflux disease)   . Hyperlipidemia   . Hypertension   . Peripheral vascular disease   . Diffuse cystic mastopathy 2013  . High cholesterol   . Pneumonia March 2014  . Cancer 09-05-14    breast  . Breast cancer    Past Surgical History  Procedure Laterality Date  . Cholecystectomy  2006  . Leg stent  2011  . Back surgery  1986    ruptured disc  . Dilation and curettage of uterus    . Colonoscopy  2010    Dr. Jamal Collin  . Mole removal  2013    15 removed  . Breast biopsy  2008  . Breast biopsy Left 08-20-14  . Breast surgery Left 09-05-14    left breast lumpectomy with SLN biopsy, ER/PR positive. HER2 negative.   Family History  Problem Relation Age of Onset  . Cancer Father     Prostate  . Hyperlipidemia Other     Parent  . Miscarriages / Stillbirths Other     Parent  . Hypertension Other     parent  . Heart disease Other     Parent  . Breast cancer Maternal Aunt    Social History   Social History  . Marital Status: Married    Spouse Name: N/A  . Number of Children: 3  . Years of Education: 12   Occupational History  . Caregiver     Social History Main Topics  . Smoking status:  Never Smoker   . Smokeless tobacco: Never Used  . Alcohol Use: No  . Drug Use: No  . Sexual Activity: No   Other Topics Concern  . None   Social History Narrative   Regular exercise-mo   Caffeine Use-no    Outpatient Encounter Prescriptions as of 01/02/2015  Medication Sig  . aspirin (BAYER ASPIRIN) 325 MG tablet Take 325 mg by mouth daily.  [name] letrozole (FEMARA) 2.5 MG tablet Take 1 tablet (2.5 mg total) by mouth daily.  [name] losartan-hydrochlorothiazide (HYZAAR) 100-25 MG per tablet TAKE 1 TABLET BY MOUTH DAILY.  . metoprolol (LOPRESSOR) 50 MG tablet Take 1 tablet (50 mg total) by mouth daily.  . rosuvastatin (CRESTOR) 10 MG tablet Take 1 tablet (10 mg total) by mouth daily.  . vitamin E 400 UNIT capsule Take 400 Units by mouth daily.  . [DISCONTINUED] ezetimibe-simvastatin (VYTORIN) 10-20 MG per tablet Take 1 tablet by mouth daily.  . [DISCONTINUED] rosuvastatin (CRESTOR) 10 MG tablet Take 1 tablet (10 mg total) by mouth daily.   No facility-administered encounter medications on file as of 01/02/2015.  Review of Systems  Constitutional: Negative for appetite change and unexpected weight change.  HENT: Negative for congestion and sinus pressure.   Eyes: Negative for discharge and visual disturbance.  Respiratory: Negative for cough, chest tightness and shortness of breath.   Cardiovascular: Negative for chest pain, palpitations and leg swelling.  Gastrointestinal: Negative for nausea, vomiting, abdominal pain and diarrhea.  Genitourinary: Negative for dysuria and difficulty urinating.  Skin: Negative for color change and rash.       Skin lesions as outlined.    Neurological: Negative for dizziness, light-headedness and headaches.  Hematological: Negative for adenopathy. Does not bruise/bleed easily.  Psychiatric/Behavioral: Negative for dysphoric mood and agitation.       Objective:     Blood pressure rechecked by me:  138/72  Physical Exam  Constitutional: She appears  well-developed and well-nourished. No distress.  HENT:  Nose: Nose normal.  Mouth/Throat: Oropharynx is clear and moist.  Eyes: Conjunctivae are normal. Right eye exhibits no discharge. Left eye exhibits no discharge.  Neck: Neck supple. No thyromegaly present.  Cardiovascular: Normal rate and regular rhythm.   Pulmonary/Chest: Breath sounds normal. No respiratory distress. She has no wheezes.  Abdominal: Soft. Bowel sounds are normal. There is no tenderness.  Musculoskeletal: She exhibits no edema or tenderness.  Lymphadenopathy:    She has no cervical adenopathy.  Skin: No rash noted. No erythema.  Psychiatric: She has a normal mood and affect. Her behavior is normal.    BP 136/70 mmHg  Pulse 68  Temp(Src) 98.3 F (36.8 C) (Oral)  Ht $R'5\' 5"'Yz$  (1.651 m)  Wt 197 lb 2 oz (89.415 kg)  BMI 32.80 kg/m2  SpO2 94% Wt Readings from Last 3 Encounters:  01/02/15 197 lb 2 oz (89.415 kg)  11/15/14 196 lb 5.1 oz (89.05 kg)  11/14/14 196 lb (88.905 kg)     Lab Results  Component Value Date   WBC 5.3 01/01/2015   HGB 12.8 01/01/2015   HCT 36.8 01/01/2015   PLT 209.0 01/01/2015   GLUCOSE 113* 01/01/2015   CHOL 185 01/01/2015   TRIG 174.0* 01/01/2015   HDL 42.10 01/01/2015   LDLDIRECT 132.1 06/13/2013   LDLCALC 108* 01/01/2015   ALT 22 01/01/2015   AST 17 01/01/2015   NA 140 01/01/2015   K 4.2 01/01/2015   CL 104 01/01/2015   CREATININE 0.96 01/01/2015   BUN 23 01/01/2015   CO2 27 01/01/2015   TSH 7.40* 01/01/2015   INR 0.9 07/13/2012   HGBA1C 6.2 01/01/2015       Assessment & Plan:   Problem List Items Addressed This Visit    Abnormal liver function test    Follow liver panel.        Breast cancer    S/p XRT.  Mammosite.  On femara.  Seeing Dr Jamal Collin.        CAD (coronary artery disease)    Followed by Dr Saralyn Pilar.        Relevant Medications   rosuvastatin (CRESTOR) 10 MG tablet   GERD (gastroesophageal reflux disease)    No upper symptoms reported.        Hypercholesterolemia - Primary    Low cholesterol diet and exercise.  Will change to crestor given persistent elevation on vytorin.  Follow liver panel and cholesterol levels.        Relevant Medications   rosuvastatin (CRESTOR) 10 MG tablet   Other Relevant Orders   Hepatic function panel   Hyperglycemia    Low carb diet  and exercise.  Follow met b and a1c.        Hypertension    Blood pressure under better control.  Continue same medication regimen.  Follow pressures.  Follow metabolic panel.         Relevant Medications   rosuvastatin (CRESTOR) 10 MG tablet   Peripheral vascular disease    Followed by Dr Lucky Cowboy.  S/p stent placement.        Relevant Medications   rosuvastatin (CRESTOR) 10 MG tablet   Skin lesions    Persistent lesion - eye, scalp and right lower leg.  Refer to dermatology.  She will call back with name of physician.        Stress    Does not feel she needs anything more at this point.  Follow.        Thyroid nodule    Was followed by Dr Gabriel Carina.  Biopsy negative.  Has been released.         Other Visit Diagnoses    Elevated TSH        Relevant Orders    TSH        Einar Pheasant, MD

## 2015-01-02 NOTE — Telephone Encounter (Signed)
Patient stated that she was advised to call back with  Information of dermatologist:([place] [facility] of [facility])

## 2015-01-04 DIAGNOSIS — G4733 Obstructive sleep apnea (adult) (pediatric): Secondary | ICD-10-CM | POA: Diagnosis not present

## 2015-01-04 DIAGNOSIS — E669 Obesity, unspecified: Secondary | ICD-10-CM | POA: Diagnosis not present

## 2015-01-06 ENCOUNTER — Encounter: Payer: Self-pay | Admitting: Internal Medicine

## 2015-01-06 ENCOUNTER — Other Ambulatory Visit: Payer: Self-pay | Admitting: Internal Medicine

## 2015-01-06 DIAGNOSIS — L989 Disorder of the skin and subcutaneous tissue, unspecified: Secondary | ICD-10-CM

## 2015-01-06 NOTE — Assessment & Plan Note (Signed)
Persistent lesion - eye, scalp and right lower leg.  Refer to dermatology.  She will call back with name of physician.

## 2015-01-06 NOTE — Assessment & Plan Note (Signed)
Low carb diet and exercise.  Follow met b and a1c.

## 2015-01-06 NOTE — Assessment & Plan Note (Signed)
Was followed by Dr Gabriel Carina.  Biopsy negative.  Has been released.

## 2015-01-06 NOTE — Assessment & Plan Note (Signed)
No upper symptoms reported.   

## 2015-01-06 NOTE — Assessment & Plan Note (Signed)
Followed by Dr. Paraschos 

## 2015-01-06 NOTE — Assessment & Plan Note (Signed)
Follow liver panel.  

## 2015-01-06 NOTE — Progress Notes (Signed)
Order placed for dermatology referral.  

## 2015-01-06 NOTE — Assessment & Plan Note (Signed)
Does not feel she needs anything more at this point.  Follow.

## 2015-01-06 NOTE — Assessment & Plan Note (Signed)
S/p XRT.  Mammosite.  On femara.  Seeing Dr Jamal Collin.

## 2015-01-06 NOTE — Assessment & Plan Note (Signed)
Blood pressure under better control.  Continue same medication regimen.  Follow pressures.  Follow metabolic panel.

## 2015-01-06 NOTE — Assessment & Plan Note (Signed)
Low cholesterol diet and exercise.  Will change to crestor given persistent elevation on vytorin.  Follow liver panel and cholesterol levels.

## 2015-01-06 NOTE — Assessment & Plan Note (Signed)
Followed by Dr Lucky Cowboy.  S/p stent placement.

## 2015-01-17 DIAGNOSIS — G4733 Obstructive sleep apnea (adult) (pediatric): Secondary | ICD-10-CM | POA: Diagnosis not present

## 2015-02-05 ENCOUNTER — Telehealth: Payer: Self-pay

## 2015-02-05 NOTE — Telephone Encounter (Signed)
Patient called the triage line.  Concerned that she has increased swelling in the past weeks, noticeable in her eyes, fingers and hands.  Only thing that has been different is her cholesterol medications were changed from Vytorin to Crestor.  Please Advise.

## 2015-02-05 NOTE — Telephone Encounter (Signed)
Spoke with Lindsey Bentley on the phone.  She states that the only symptoms she was having was the numbness in her fingers and toes and then the swelling.  She held the crestor this morning and the swelling has improved.  She denied any chest pain, tightness, issues with her blood pressure.  She is available any day this week for a appointment if you would like.  She is going to hold off on refilling the crestor until you advise otherwise.

## 2015-02-05 NOTE — Telephone Encounter (Signed)
With her history, please confirm no other symptoms.  Any sob, chest pain, etc?  Confirm blood pressure ok.  If any acute symptoms, needs evaluation today.

## 2015-02-06 NOTE — Telephone Encounter (Signed)
Spoke with patient, she will be here on the 30th at noon.  Wanted you to know that her swelling has decreased more since yesterday and the numbness in her fingers and toes is gone completely.

## 2015-02-06 NOTE — Telephone Encounter (Signed)
If ok and no acute issues, I can see her on 02/08/15 at 12:00.  Thanks.

## 2015-02-08 ENCOUNTER — Ambulatory Visit (INDEPENDENT_AMBULATORY_CARE_PROVIDER_SITE_OTHER): Payer: Medicare Other | Admitting: Internal Medicine

## 2015-02-08 ENCOUNTER — Emergency Department: Payer: Medicare Other

## 2015-02-08 ENCOUNTER — Encounter: Payer: Self-pay | Admitting: *Deleted

## 2015-02-08 ENCOUNTER — Encounter: Payer: Self-pay | Admitting: Internal Medicine

## 2015-02-08 ENCOUNTER — Emergency Department
Admission: EM | Admit: 2015-02-08 | Discharge: 2015-02-08 | Disposition: A | Discharge status: Home / Self Care | Payer: Medicare Other | Attending: Emergency Medicine | Admitting: Emergency Medicine

## 2015-02-08 VITALS — BP 126/60 | HR 70 | Temp 98.3°F | Resp 18 | Ht 65.0 in | Wt 196.8 lb

## 2015-02-08 DIAGNOSIS — R0789 Other chest pain: Secondary | ICD-10-CM | POA: Diagnosis not present

## 2015-02-08 DIAGNOSIS — H538 Other visual disturbances: Secondary | ICD-10-CM | POA: Diagnosis not present

## 2015-02-08 DIAGNOSIS — R220 Localized swelling, mass and lump, head: Secondary | ICD-10-CM | POA: Diagnosis not present

## 2015-02-08 DIAGNOSIS — R079 Chest pain, unspecified: Secondary | ICD-10-CM | POA: Insufficient documentation

## 2015-02-08 DIAGNOSIS — R0600 Dyspnea, unspecified: Secondary | ICD-10-CM | POA: Diagnosis not present

## 2015-02-08 DIAGNOSIS — I25119 Atherosclerotic heart disease of native coronary artery with unspecified angina pectoris: Secondary | ICD-10-CM

## 2015-02-08 DIAGNOSIS — R51 Headache: Secondary | ICD-10-CM | POA: Diagnosis not present

## 2015-02-08 DIAGNOSIS — Z7982 Long term (current) use of aspirin: Secondary | ICD-10-CM | POA: Insufficient documentation

## 2015-02-08 DIAGNOSIS — Z79899 Other long term (current) drug therapy: Secondary | ICD-10-CM | POA: Diagnosis not present

## 2015-02-08 DIAGNOSIS — I10 Essential (primary) hypertension: Secondary | ICD-10-CM | POA: Insufficient documentation

## 2015-02-08 DIAGNOSIS — R519 Headache, unspecified: Secondary | ICD-10-CM | POA: Insufficient documentation

## 2015-02-08 DIAGNOSIS — K219 Gastro-esophageal reflux disease without esophagitis: Secondary | ICD-10-CM

## 2015-02-08 DIAGNOSIS — R0602 Shortness of breath: Secondary | ICD-10-CM | POA: Diagnosis not present

## 2015-02-08 DIAGNOSIS — R202 Paresthesia of skin: Secondary | ICD-10-CM | POA: Insufficient documentation

## 2015-02-08 DIAGNOSIS — I739 Peripheral vascular disease, unspecified: Secondary | ICD-10-CM

## 2015-02-08 LAB — COMPREHENSIVE METABOLIC PANEL
ALT: 28 U/L (ref 14–54)
AST: 26 U/L (ref 15–41)
Albumin: 4.5 g/dL (ref 3.5–5.0)
Alkaline Phosphatase: 50 U/L (ref 38–126)
Anion gap: 7 (ref 5–15)
BUN: 24 mg/dL — ABNORMAL HIGH (ref 6–20)
CHLORIDE: 105 mmol/L (ref 101–111)
CO2: 26 mmol/L (ref 22–32)
Calcium: 9.6 mg/dL (ref 8.9–10.3)
Creatinine, Ser: 0.89 mg/dL (ref 0.44–1.00)
Glucose, Bld: 78 mg/dL (ref 65–99)
POTASSIUM: 3.9 mmol/L (ref 3.5–5.1)
Sodium: 138 mmol/L (ref 135–145)
Total Bilirubin: 0.9 mg/dL (ref 0.3–1.2)
Total Protein: 8 g/dL (ref 6.5–8.1)

## 2015-02-08 LAB — TROPONIN I: Troponin I: <0.03 ng/mL (ref <0.031)

## 2015-02-08 LAB — CBC
HEMATOCRIT: 38.4 % (ref 35.0–47.0)
Hemoglobin: 13.5 g/dL (ref 12.0–16.0)
MCH: 32.5 pg (ref 26.0–34.0)
MCHC: 35.2 g/dL (ref 32.0–36.0)
MCV: 92.3 fL (ref 80.0–100.0)
Platelets: 201 10*3/uL (ref 150–440)
RBC: 4.16 MIL/uL (ref 3.80–5.20)
RDW: 13 % (ref 11.5–14.5)
WBC: 6 10*3/uL (ref 3.6–11.0)

## 2015-02-08 MED ORDER — LORAZEPAM 1 MG PO TABS
1.0000 mg | ORAL_TABLET | Freq: Once | ORAL | Status: AC
  Administered 2015-02-08: 1 mg via ORAL
  Filled 2015-02-08: qty 1

## 2015-02-08 NOTE — Discharge Instructions (Signed)
Blurred Vision You have been seen today complaining of blurred vision. This means you have a loss of ability to see small details.  CAUSES  Blurred vision can be a symptom of underlying eye problems, such as:  Aging of the eye (presbyopia).  Glaucoma.  Cataracts.  Eye infection.  Eye-related migraine.  Diabetes mellitus.  Fatigue.  Migraine headaches.  High blood pressure.  Breakdown of the back of the eye (macular degeneration).  Problems caused by some medications. The most common cause of blurred vision is the need for eyeglasses or a new prescription. Today in the emergency department, no cause for your blurred vision can be found. SYMPTOMS  Blurred vision is the loss of visual sharpness and detail (acuity). DIAGNOSIS  Should blurred vision continue, you should see your caregiver. If your caregiver is your primary care physician, he or she may choose to refer you to another specialist.  TREATMENT  Do not ignore your blurred vision. Make sure to have it checked out to see if further treatment or referral is necessary. SEEK MEDICAL CARE IF:  You are unable to get into a specialist so we can help you with a referral. SEEK IMMEDIATE MEDICAL CARE IF: You have severe eye pain, severe headache, or sudden loss of vision. MAKE SURE YOU:   Understand these instructions.  Will watch your condition.  Will get help right away if you are not doing well or get worse. Document Released: 04/30/2003 Document Revised: 07/20/2011 Document Reviewed: 11/30/2007 [facility] Patient Information 2015 [place], [place]. This information is not intended to replace advice given to you by your health care provider. Make sure you discuss any questions you have with your health care provider.  Paresthesia Paresthesia is a burning or prickling feeling. This feeling can happen in any part of the body. It often happens in the hands, arms, legs, or feet. HOME CARE  Avoid drinking alcohol.  Try  massage or needle therapy (acupuncture) to help with your problems.  Keep all doctor visits as told. GET HELP RIGHT AWAY IF:   You feel weak.  You have trouble walking or moving.  You have problems speaking or seeing.  You feel confused.  You cannot control when you poop (bowel movement) or pee (urinate).  You lose feeling (numbness) after an injury.  You pass out (faint).  Your burning or prickling feeling gets worse when you walk.  You have pain, cramps, or feel dizzy.  You have a rash. MAKE SURE YOU:   Understand these instructions.  Will watch your condition.  Will get help right away if you are not doing well or get worse. Document Released: 04/09/2008 Document Revised: 07/20/2011 Document Reviewed: 01/16/2011 [facility] Patient Information 2015 [place], [place]. This information is not intended to replace advice given to you by your health care provider. Make sure you discuss any questions you have with your health care provider.  Shortness of Breath Shortness of breath means you have trouble breathing. Shortness of breath needs medical care right away. HOME CARE   Do not smoke.  Avoid being around chemicals or things (paint fumes, dust) that may bother your breathing.  Rest as needed. Slowly begin your normal activities.  Only take medicines as told by your doctor.  Keep all doctor visits as told. GET HELP RIGHT AWAY IF:   Your shortness of breath gets worse.  You feel lightheaded, pass out (faint), or have a cough that is not helped by medicine.  You cough up blood.  You have pain  with breathing.  You have pain in your chest, arms, shoulders, or belly (abdomen).  You have a fever.  You cannot walk up stairs or exercise the way you normally do.  You do not get better in the time expected.  You have a hard time doing normal activities even with rest.  You have problems with your medicines.  You have any new symptoms. MAKE SURE  YOU:  Understand these instructions.  Will watch your condition.  Will get help right away if you are not doing well or get worse. Document Released: 10/14/2007 Document Revised: 05/02/2013 Document Reviewed: 07/13/2011 [facility] Patient Information 2015 [place], [place]. This information is not intended to replace advice given to you by your health care provider. Make sure you discuss any questions you have with your health care provider.

## 2015-02-08 NOTE — ED Provider Notes (Signed)
[facility] Emergency Department Provider Note  Time seen: 3:31 PM  I have reviewed the triage vital signs and the nursing notes.   HISTORY  Chief Complaint Blurred Vision; Shortness of Breath; and Chest Pain    HPI Lindsey Bentley is a 69 y.o. female with a past medical history of depression, gastric reflux, hypertension, hyperlipidemia, breast cancer, presents the emergency department with symptoms of head pressure, and tingling in her hands and feet, shortness of breath, and occasional chest pressure. According to the patient for the past 5-7 days she has been having chest pressure at times, feeling short of breath at times. During this timeframe she is also noted pressure within her head, and occasionally will feel tingling in both her hands and feet. Denies any unilateral weakness or numbness. Denies any chest pain or shortness of breath currently. States she has noted the shortness of breath is somewhat worse with exertion, but also occurs at rest. Patient saw her primary care physician Dr. Nicki Reaper, who sent her to the emergency department for further evaluation. Patient is also noted intermittent blurred vision at times, but denies any currently.     Past Medical History  Diagnosis Date  . Depression   . History of chicken pox   . Glaucoma   . GERD (gastroesophageal reflux disease)   . Hyperlipidemia   . Hypertension   . Peripheral vascular disease   . Diffuse cystic mastopathy 2013  . High cholesterol   . Pneumonia March 2014  . Cancer 09-05-14    breast  . Breast cancer     Patient Active Problem List   Diagnosis Date Noted  . Pressure in head 02/08/2015  . Hypercholesterolemia 01/02/2015  . Breast cancer 11/06/2014  . Health care maintenance 11/06/2014  . Ankle swelling 11/06/2014  . Stress 11/06/2014  . Neck fullness 06/29/2014  . Osteopenia 03/31/2014  . Snoring 11/05/2013  . Skin lesions 11/05/2013  . Light headedness 10/30/2013  .  Hyperglycemia 02/09/2013  . Abnormal liver function test 08/05/2012  . Thyroid nodule 05/31/2012  . CAD (coronary artery disease) 05/31/2012  . Hypertension 02/24/2012  . Peripheral vascular disease 02/24/2012  . GERD (gastroesophageal reflux disease) 02/24/2012    Past Surgical History  Procedure Laterality Date  . Cholecystectomy  2006  . Leg stent  2011  . Back surgery  1986    ruptured disc  . Dilation and curettage of uterus    . Colonoscopy  2010    Dr. Jamal Collin  . Mole removal  2013    15 removed  . Breast biopsy  2008  . Breast biopsy Left 08-20-14  . Breast surgery Left 09-05-14    left breast lumpectomy with SLN biopsy, ER/PR positive. HER2 negative.    Current Outpatient Rx  Name  Route  Sig  Dispense  Refill  . aspirin (BAYER ASPIRIN) 325 MG tablet   Oral   Take 325 mg by mouth daily.         [name] letrozole (FEMARA) 2.5 MG tablet   Oral   Take 1 tablet (2.5 mg total) by mouth daily.   30 tablet   12   . losartan-hydrochlorothiazide (HYZAAR) 100-25 MG per tablet      TAKE 1 TABLET BY MOUTH DAILY.   90 tablet   1   . metoprolol (LOPRESSOR) 50 MG tablet   Oral   Take 1 tablet (50 mg total) by mouth daily.   30 tablet   5   . vitamin  E 400 UNIT capsule   Oral   Take 400 Units by mouth daily.           Allergies Codeine; Crestor; and Prednisone  Family History  Problem Relation Age of Onset  . Cancer Father     Prostate  . Hyperlipidemia Other     Parent  . Miscarriages / Stillbirths Other     Parent  . Hypertension Other     parent  . Heart disease Other     Parent  . Breast cancer Maternal Aunt     Social History Social History  Substance Use Topics  . Smoking status: Never Smoker   . Smokeless tobacco: Never Used  . Alcohol Use: No    Review of Systems Constitutional: Negative for fever. Eyes: Blurred vision at times. None currently. Cardiovascular: Chest pressure, none currently. Respiratory: Intermittent shortness of  breath, none currently. Gastrointestinal: Negative for abdominal pain, vomiting and diarrhea. Musculoskeletal: Negative for back pain. Skin: Bump on scalp, has dermatology appointment for removal. Neurological: Denies headache, but states a pressure sensation in her head at times. Denies focal weakness or numbness, but states paresthesias in her hands and feet at times, none currently. 10-point ROS otherwise negative.  ____________________________________________   PHYSICAL EXAM:  VITAL SIGNS: ED Triage Vitals  Enc Vitals Group     BP 02/08/15 1359 161/82 mmHg     Pulse Rate 02/08/15 1359 62     Resp 02/08/15 1359 16     Temp 02/08/15 1359 98 F (36.7 C)     Temp src --      SpO2 02/08/15 1359 97 %     Weight --      Height --      Head Cir --      Peak Flow --      Pain Score 02/08/15 1357 7     Pain Loc --      Pain Edu? --      Excl. in [place]? --     Constitutional: Alert and oriented. Well appearing and in no distress. Eyes: Normal exam ENT   Head: Normocephalic and atraumatic. Small papular bumps to top of scalp, no signs of infection/inflammation.   Mouth/Throat: Mucous membranes are moist. Cardiovascular: Normal rate, regular rhythm. Respiratory: Normal respiratory effort without tachypnea nor retractions. Breath sounds are clear and equal bilaterally. No wheezes/rales/rhonchi. Gastrointestinal: Soft and nontender. No distention.  Musculoskeletal: Nontender with normal range of motion in all extremities. No lower extremity tenderness or edema. Neurologic:  Normal speech and language. No gross focal neurologic deficits are appreciated. Speech is normal. 5/5 motor in all extremities. No pronator drift. Skin:  Skin is warm, dry and intact.  Psychiatric: Mood and affect are normal. Speech and behavior are normal.  ____________________________________________    EKG  EKG reviewed and interpreted by myself shows normal sinus rhythm at 62 bpm, narrow QRS, normal  axis, normal intervals, no ST changes noted. Overall normal EKG.  ____________________________________________    RADIOLOGY  Chest x-ray shows no acute abdomen.  ____________________________________________   INITIAL IMPRESSION / ASSESSMENT AND PLAN / ED COURSE  Pertinent labs & imaging results that were available during my care of the patient were reviewed by me and considered in my medical decision making (see chart for details).  Patient with vague symptoms ongoing for 5-7 days. Appears very well currently with a normal examination. Denies any symptoms at this time. Patient saw her primary care physician today and sent her to the emergency department  for further evaluation. Labs are largely within normal limits including negative troponin. EKG is normal. Chest x-ray is normal. We will obtain a CT head to help further evaluate, patient agreeable to plan.  CT head is negative. We'll discharge the patient home with primary care follow-up. Patient agreeable to plan. ____________________________________________   FINAL CLINICAL IMPRESSION(S) / ED DIAGNOSES  Dyspnea Chest pain Blurred vision   Harvest Dark, MD 02/08/15 1710

## 2015-02-08 NOTE — Assessment & Plan Note (Signed)
Followed by Dr Dew.  S/p stent placement.   

## 2015-02-08 NOTE — Assessment & Plan Note (Signed)
Known CAD.  With intermittent chest pressure.  EKG obtained and revealed SR with no acute ischemic changes.  Given the persistent intermittent chest pressure and sob with exertion in the setting of pt not feeling well, head pressure and blurred vision - I feel ER evaluation warranted.  Pt in agreement.  ER notified.

## 2015-02-08 NOTE — Progress Notes (Signed)
Pre-visit discussion using our clinic review tool. No additional management support is needed unless otherwise documented below in the visit note.  

## 2015-02-08 NOTE — ED Notes (Signed)
Patient back from CT.

## 2015-02-08 NOTE — Assessment & Plan Note (Signed)
Persistent pressure in her head with associated vision change - blurred vision.  No other focal findings.  Given her history of vascular issues, feel further ER w/up warranted.  Have them evaluate for possible scan.

## 2015-02-08 NOTE — ED Notes (Signed)
Pt comes in from Dr. Bary Leriche office with c/o  Chest pressure, headache, shortness of breath, blurred vision for two days.

## 2015-02-08 NOTE — Progress Notes (Signed)
[name], female   DOB: 03-14-1946, 69 y.o.   MRN: [id number]   Subjective:    [name], female    DOB: 03/07/1946, 69 y.o.   MRN: [id number]  HPI  Patient with known CAD followed by Dr Saralyn Pilar, hypertension, PVD and hypercholesterolemia.  She comes in today as a work in with concerns regarding increased chest pressure, sob with exertion, head pressure and blurred vision.  She reports she has had chest pressure off and on.  Constant head pressure.  Blurred vision.  States she just does not feel well.  She is eating.  No vomiting.  Bowels moving.  Recently started crestor.  Noticed some increased swelling.  Was questioning whether the crestor could be contributing to the swelling.  The swelling is better since stopping the crestor, but the remainder of the symptoms have not improved.  Does not feel any better.     Past Medical History  Diagnosis Date  . Depression   . History of chicken pox   . Glaucoma   . GERD (gastroesophageal reflux disease)   . Hyperlipidemia   . Hypertension   . Peripheral vascular disease ([place])   . Diffuse cystic mastopathy 2013  . High cholesterol   . Pneumonia March 2014  . Cancer [facility]) 09-05-14    breast  . Breast cancer [facility])    Past Surgical History  Procedure Laterality Date  . Cholecystectomy  2006  . Leg stent  2011  . Back surgery  1986    ruptured disc  . Dilation and curettage of uterus    . Colonoscopy  2010    Dr. Jamal Collin  . Mole removal  2013    15 removed  . Breast biopsy  2008  . Breast biopsy Left 08-20-14  . Breast surgery Left 09-05-14    left breast lumpectomy with SLN biopsy, ER/PR positive. HER2 negative.   Family History  Problem Relation Age of Onset  . Cancer Father     Prostate  . Hyperlipidemia Other     Parent  . Miscarriages / Stillbirths Other     Parent  . Hypertension Other     parent  . Heart disease Other     Parent  . Breast cancer Maternal Aunt    Social History    Social History  . Marital Status: Married    Spouse Name: N/A  . Number of Children: 3  . Years of Education: 12   Occupational History  . Caregiver     Social History Main Topics  . Smoking status: Never Smoker   . Smokeless tobacco: Never Used  . Alcohol Use: No  . Drug Use: No  . Sexual Activity: No   Other Topics Concern  . None   Social History Narrative   Regular exercise-mo   Caffeine Use-no    Outpatient Encounter Prescriptions as of 02/08/2015  Medication Sig  . aspirin (BAYER ASPIRIN) 325 MG tablet Take 325 mg by mouth daily.  [name] letrozole (FEMARA) 2.5 MG tablet Take 1 tablet (2.5 mg total) by mouth daily.  [name] losartan-hydrochlorothiazide (HYZAAR) 100-25 MG per tablet TAKE 1 TABLET BY MOUTH DAILY.  . metoprolol (LOPRESSOR) 50 MG tablet Take 1 tablet (50 mg total) by mouth daily.  . vitamin E 400 UNIT capsule Take 400 Units by mouth daily.  . [DISCONTINUED] rosuvastatin (CRESTOR) 10 MG tablet Take 1 tablet (10 mg total) by mouth daily.   No facility-administered encounter medications on file as  of 02/08/2015.    Review of Systems  Constitutional: Negative for appetite change and unexpected weight change.  HENT: Negative for congestion and sinus pressure.   Respiratory: Positive for chest tightness and shortness of breath (sob with exertion. ). Negative for cough.   Cardiovascular: Positive for leg swelling. Negative for chest pain and palpitations.  Gastrointestinal: Negative for nausea, vomiting, abdominal pain and diarrhea.  Genitourinary: Negative for dysuria and difficulty urinating.  Musculoskeletal: Negative for back pain.       Lower extremity swelling.  No muscle aches.   Skin: Negative for color change and rash.  Neurological: Positive for headaches. Negative for dizziness.  Psychiatric/Behavioral: Negative for dysphoric mood and agitation.       Objective:     Blood pressure rechecked by me: 152/78  Physical Exam  Constitutional: She appears  well-developed and well-nourished.  HENT:  Nose: Nose normal.  Mouth/Throat: Oropharynx is clear and moist.  Eyes: Conjunctivae are normal. Right eye exhibits no discharge. Left eye exhibits no discharge.  Neck: Neck supple. No thyromegaly present.  Cardiovascular: Normal rate and regular rhythm.   Pulmonary/Chest: Breath sounds normal. No respiratory distress. She has no wheezes.  Abdominal: Soft. Bowel sounds are normal. There is no tenderness.  Musculoskeletal: She exhibits no tenderness.  Left ankle > right.  DP pulses palpable and equal bilaterally.   Lymphadenopathy:    She has no cervical adenopathy.  Skin: No rash noted. No erythema.  Psychiatric: She has a normal mood and affect. Her behavior is normal.    BP 126/60 mmHg  Pulse 70  Temp(Src) 98.3 F (36.8 C) (Oral)  Resp 18  Ht _0  (1.651 m)  Wt 196 lb 12 oz (89.245 kg)  BMI 32.74 kg/m2  SpO2 97% Wt Readings from Last 3 Encounters:  02/08/15 196 lb 12 oz (89.245 kg)  01/02/15 197 lb 2 oz (89.415 kg)  11/15/14 196 lb 5.1 oz (89.05 kg)     Lab Results  Component Value Date   WBC 6.0 02/08/2015   HGB 13.5 02/08/2015   HCT 38.4 02/08/2015   PLT 201 02/08/2015   GLUCOSE 78 02/08/2015   CHOL 185 01/01/2015   TRIG 174.0* 01/01/2015   HDL 42.10 01/01/2015   LDLDIRECT 132.1 06/13/2013   LDLCALC 108* 01/01/2015   ALT 22 02/14/2015   AST 16 02/14/2015   NA 138 02/08/2015   K 3.9 02/08/2015   CL 105 02/08/2015   CREATININE 0.89 02/08/2015   BUN 24* 02/08/2015   CO2 26 02/08/2015   TSH 5.15* 02/14/2015   INR 0.9 07/13/2012   HGBA1C 6.2 01/01/2015       Assessment & Plan:   Problem List Items Addressed This Visit    CAD (coronary artery disease)    Known CAD.  With intermittent chest pressure.  EKG obtained and revealed SR with no acute ischemic changes.  Given the persistent intermittent chest pressure and sob with exertion in the setting of pt not feeling well, head pressure and blurred vision - I feel ER  evaluation warranted.  Pt in agreement.  ER notified.        GERD (gastroesophageal reflux disease)    No upper symptoms reported.       Hypertension    Blood pressure slightly increased.  Feel related to above.  Follow.  Adjust medication as needed.        Peripheral vascular disease ([place])    Followed by Dr Lucky Cowboy.  S/p stent placement.  Pressure in head    Persistent pressure in her head with associated vision change - blurred vision.  No other focal findings.  Given her history of vascular issues, feel further ER w/up warranted.  Have them evaluate for possible scan.         Other Visit Diagnoses    Other chest pain    -  Primary    Relevant Orders    EKG 12-Lead (Completed)        Einar Pheasant, MD

## 2015-02-08 NOTE — Assessment & Plan Note (Signed)
Blood pressure slightly increased.  Feel related to above.  Follow.  Adjust medication as needed.

## 2015-02-13 DIAGNOSIS — L820 Inflamed seborrheic keratosis: Secondary | ICD-10-CM | POA: Diagnosis not present

## 2015-02-13 DIAGNOSIS — D485 Neoplasm of uncertain behavior of skin: Secondary | ICD-10-CM | POA: Diagnosis not present

## 2015-02-14 ENCOUNTER — Other Ambulatory Visit (INDEPENDENT_AMBULATORY_CARE_PROVIDER_SITE_OTHER): Payer: Medicare Other

## 2015-02-14 DIAGNOSIS — E7800 Pure hypercholesterolemia, unspecified: Secondary | ICD-10-CM | POA: Diagnosis not present

## 2015-02-14 DIAGNOSIS — R7989 Other specified abnormal findings of blood chemistry: Secondary | ICD-10-CM

## 2015-02-14 DIAGNOSIS — R946 Abnormal results of thyroid function studies: Secondary | ICD-10-CM

## 2015-02-14 LAB — HEPATIC FUNCTION PANEL
ALBUMIN: 4.1 g/dL (ref 3.5–5.2)
ALK PHOS: 49 U/L (ref 39–117)
ALT: 22 U/L (ref 0–35)
AST: 16 U/L (ref 0–37)
BILIRUBIN TOTAL: 0.5 mg/dL (ref 0.2–1.2)
Bilirubin, Direct: 0.1 mg/dL (ref 0.0–0.3)
Total Protein: 7 g/dL (ref 6.0–8.3)

## 2015-02-14 LAB — TSH: TSH: 5.15 u[IU]/mL — ABNORMAL HIGH (ref 0.35–4.50)

## 2015-02-16 DIAGNOSIS — G4733 Obstructive sleep apnea (adult) (pediatric): Secondary | ICD-10-CM | POA: Diagnosis not present

## 2015-02-17 ENCOUNTER — Encounter: Payer: Self-pay | Admitting: Internal Medicine

## 2015-02-17 NOTE — Assessment & Plan Note (Signed)
No upper symptoms reported.   

## 2015-02-25 ENCOUNTER — Other Ambulatory Visit: Payer: Self-pay | Admitting: Internal Medicine

## 2015-02-26 ENCOUNTER — Ambulatory Visit: Payer: Medicare Other | Admitting: General Surgery

## 2015-03-05 ENCOUNTER — Encounter: Payer: Self-pay | Admitting: General Surgery

## 2015-03-05 DIAGNOSIS — R921 Mammographic calcification found on diagnostic imaging of breast: Secondary | ICD-10-CM | POA: Diagnosis not present

## 2015-03-05 DIAGNOSIS — Z9889 Other specified postprocedural states: Secondary | ICD-10-CM | POA: Diagnosis not present

## 2015-03-05 DIAGNOSIS — C50512 Malignant neoplasm of lower-outer quadrant of left female breast: Secondary | ICD-10-CM | POA: Diagnosis not present

## 2015-03-12 ENCOUNTER — Ambulatory Visit (INDEPENDENT_AMBULATORY_CARE_PROVIDER_SITE_OTHER): Payer: Medicare Other | Admitting: General Surgery

## 2015-03-12 ENCOUNTER — Encounter: Payer: Self-pay | Admitting: General Surgery

## 2015-03-12 VITALS — BP 130/68 | HR 74 | Resp 12 | Ht 65.0 in | Wt 197.0 lb

## 2015-03-12 DIAGNOSIS — C50512 Malignant neoplasm of lower-outer quadrant of left female breast: Secondary | ICD-10-CM | POA: Diagnosis not present

## 2015-03-12 NOTE — Patient Instructions (Signed)
Continue self breast exams. Call office for any new breast issues or concerns. B/l diagnostic mammogram to be scheduled for 6 months.

## 2015-03-12 NOTE — Progress Notes (Signed)
[name], female   DOB: 29-Jan-1946, 69 y.o.   MRN: [id number]  Chief Complaint  Patient presents with  . Follow-up    mammogram    HPI Lindsey Bentley is a 69 y.o. female who presents for a breast cancer follow up . The most recent left breast mammogram was done on 03/04/15.  Patient does perform regular self breast checks and gets regular mammograms done.   I have reviewed the history of present illness with the patient. HPI  Past Medical History  Diagnosis Date  . Depression   . History of chicken pox   . Glaucoma   . GERD (gastroesophageal reflux disease)   . Hyperlipidemia   . Hypertension   . Peripheral vascular disease ([place])   . Diffuse cystic mastopathy 2013  . High cholesterol   . Pneumonia March 2014  . Cancer (H[facility])     Breast cancer - TI, NO, [place] - IDC, ER/PR pos, Her 2 neg    Past Surgical History  Procedure Laterality Date  . Cholecystectomy  2006  . Leg stent  2011  . Back surgery  1986    ruptured disc  . Dilation and curettage of uterus    . Colonoscopy  2010    Dr. Jamal Collin  . Mole removal  2013    15 removed  . Breast biopsy  2008  . Breast biopsy Left 08-20-14  . Breast surgery Left 09-05-14    left breast lumpectomy with SLN biopsy, ER/PR positive. HER2 negative.    Family History  Problem Relation Age of Onset  . Cancer Father     Prostate  . Hyperlipidemia Other     Parent  . Miscarriages / Stillbirths Other     Parent  . Hypertension Other     parent  . Heart disease Other     Parent  . Breast cancer Maternal Aunt     Social History Social History  Substance Use Topics  . Smoking status: Never Smoker   . Smokeless tobacco: Never Used  . Alcohol Use: No    Allergies  Allergen Reactions  . Codeine Nausea And Vomiting  . Crestor [Rosuvastatin Calcium] Swelling  . Prednisone Swelling    Current Outpatient Prescriptions  Medication Sig Dispense Refill  . aspirin (BAYER ASPIRIN) 325 MG tablet Take 325 mg by  mouth daily.    [name] letrozole (FEMARA) 2.5 MG tablet Take 1 tablet (2.5 mg total) by mouth daily. 30 tablet 12  . losartan-hydrochlorothiazide (HYZAAR) 100-25 MG per tablet TAKE 1 TABLET BY MOUTH DAILY. 90 tablet 1  . metoprolol (LOPRESSOR) 50 MG tablet TAKE 1 TABLET BY MOUTH EVERY DAY 30 tablet 0  . vitamin E 400 UNIT capsule Take 400 Units by mouth daily.     No current facility-administered medications for this visit.    Review of Systems Review of Systems  Constitutional: Negative.   Respiratory: Negative.   Cardiovascular: Negative.     Blood pressure 130/68, pulse 74, resp. rate 12, height $RemoveBe'5\' 5"'ESClRJniV$  (1.651 m), weight 197 lb (89.359 kg).  Physical Exam Physical Exam  Constitutional: She is oriented to person, place, and time. She appears well-developed and well-nourished.  Eyes: Conjunctivae are normal. No scleral icterus.  Neck: Neck supple.  Cardiovascular: Normal rate, regular rhythm and normal heart sounds.   Pulmonary/Chest: Effort normal and breath sounds normal. Right breast exhibits no inverted nipple, no mass, no nipple discharge, no skin change and no tenderness. Left breast exhibits skin change.  Left breast exhibits no inverted nipple, no mass, no nipple discharge and no tenderness.  Mild irregularity at radiation site in left lower outer quadrant, mild skin changes from radiation.   Abdominal: Soft. Bowel sounds are normal. There is no tenderness.  Lymphadenopathy:    She has no cervical adenopathy.    She has no axillary adenopathy.  Neurological: She is alert and oriented to person, place, and time.  Skin: Skin is warm and dry.    Data Reviewed Mammogram left breastreviewed-post surgical changes  Assessment    CA, left breast lower outer quadrant. 6 months post lumpectomy and radiation. Currently on letrozole and doing well.     Plan    B/l diagnostic mammogram in 6 months      PCP:  Derry Skill 03/12/2015, 12:50 PM

## 2015-03-15 DIAGNOSIS — M79609 Pain in unspecified limb: Secondary | ICD-10-CM | POA: Diagnosis not present

## 2015-03-15 DIAGNOSIS — M7989 Other specified soft tissue disorders: Secondary | ICD-10-CM | POA: Diagnosis not present

## 2015-03-19 ENCOUNTER — Ambulatory Visit: Payer: Medicare Other | Admitting: General Surgery

## 2015-03-25 ENCOUNTER — Other Ambulatory Visit: Payer: Self-pay | Admitting: Internal Medicine

## 2015-04-02 DIAGNOSIS — I890 Lymphedema, not elsewhere classified: Secondary | ICD-10-CM | POA: Diagnosis not present

## 2015-04-02 DIAGNOSIS — M7989 Other specified soft tissue disorders: Secondary | ICD-10-CM | POA: Diagnosis not present

## 2015-04-12 DIAGNOSIS — I6529 Occlusion and stenosis of unspecified carotid artery: Secondary | ICD-10-CM | POA: Diagnosis not present

## 2015-04-12 DIAGNOSIS — M7989 Other specified soft tissue disorders: Secondary | ICD-10-CM | POA: Diagnosis not present

## 2015-04-12 DIAGNOSIS — I6523 Occlusion and stenosis of bilateral carotid arteries: Secondary | ICD-10-CM | POA: Diagnosis not present

## 2015-04-12 DIAGNOSIS — M79609 Pain in unspecified limb: Secondary | ICD-10-CM | POA: Diagnosis not present

## 2015-04-22 ENCOUNTER — Other Ambulatory Visit: Payer: Self-pay | Admitting: Internal Medicine

## 2015-05-08 ENCOUNTER — Encounter: Payer: Self-pay | Admitting: Internal Medicine

## 2015-05-08 ENCOUNTER — Ambulatory Visit (INDEPENDENT_AMBULATORY_CARE_PROVIDER_SITE_OTHER): Payer: Medicare Other | Admitting: Internal Medicine

## 2015-05-08 VITALS — BP 151/73 | HR 64 | Temp 98.0°F | Ht 65.0 in | Wt 198.4 lb

## 2015-05-08 DIAGNOSIS — C50912 Malignant neoplasm of unspecified site of left female breast: Secondary | ICD-10-CM

## 2015-05-08 DIAGNOSIS — M6530 Trigger finger, unspecified finger: Secondary | ICD-10-CM | POA: Diagnosis not present

## 2015-05-08 DIAGNOSIS — E7800 Pure hypercholesterolemia, unspecified: Secondary | ICD-10-CM

## 2015-05-08 DIAGNOSIS — E782 Mixed hyperlipidemia: Secondary | ICD-10-CM | POA: Diagnosis not present

## 2015-05-08 DIAGNOSIS — I739 Peripheral vascular disease, unspecified: Secondary | ICD-10-CM

## 2015-05-08 DIAGNOSIS — R7989 Other specified abnormal findings of blood chemistry: Secondary | ICD-10-CM

## 2015-05-08 DIAGNOSIS — R0789 Other chest pain: Secondary | ICD-10-CM | POA: Diagnosis not present

## 2015-05-08 DIAGNOSIS — I10 Essential (primary) hypertension: Secondary | ICD-10-CM | POA: Diagnosis not present

## 2015-05-08 DIAGNOSIS — E041 Nontoxic single thyroid nodule: Secondary | ICD-10-CM

## 2015-05-08 DIAGNOSIS — R739 Hyperglycemia, unspecified: Secondary | ICD-10-CM

## 2015-05-08 DIAGNOSIS — I25119 Atherosclerotic heart disease of native coronary artery with unspecified angina pectoris: Secondary | ICD-10-CM | POA: Diagnosis not present

## 2015-05-08 DIAGNOSIS — R0602 Shortness of breath: Secondary | ICD-10-CM

## 2015-05-08 DIAGNOSIS — R945 Abnormal results of liver function studies: Secondary | ICD-10-CM

## 2015-05-08 DIAGNOSIS — R079 Chest pain, unspecified: Secondary | ICD-10-CM | POA: Diagnosis not present

## 2015-05-08 NOTE — Progress Notes (Signed)
[name], female   DOB: [birth year], 69 y.o.   MRN: [id number]   Subjective:    [name], female    DOB: 23-Feb-1946, 69 y.o.   MRN: [id number]  HPI  Patient with past history of breast cancer, depression, GERD, hypertension, PVD, hypercholesterolemia and CAD.  She comes in today to follow up on these issues.  She reports she saw Dr Lucky Cowboy last month.  States everything checked out ok.  States flow was good.  Carotid arteries ok.  She reports problems with her third finger - trigger finger.  Increased pain.  Worsening.  Discussed referral today.  Refer to Dr Jefm Bryant.  Also reports still noticing some intermittent chest pressure.  Some sob with exertion.  No acid reflux.  No abdominal pain or cramping.  Bowels stable.  Some left knee soreness.  Sees Dr Jamal Collin for her breast.  On femara.  Was questioning if some of her issues could be related to this medication.  He evaluated her 03/2015.  Recommended f/u bilateral mammogram in 6 months.     Past Medical History  Diagnosis Date  . Depression   . History of chicken pox   . Glaucoma   . GERD (gastroesophageal reflux disease)   . Hyperlipidemia   . Hypertension   . Peripheral vascular disease ([place])   . Diffuse cystic mastopathy 2013  . High cholesterol   . Pneumonia March 2014  . Cancer (HCC)     Breast cancer - TI, NO, [place] - IDC, ER/PR pos, Her 2 neg   Past Surgical History  Procedure Laterality Date  . Cholecystectomy  2006  . Leg stent  2011  . Back surgery  1986    ruptured disc  . Dilation and curettage of uterus    . Colonoscopy  2010    Dr. Jamal Collin  . Mole removal  2013    15 removed  . Breast biopsy  2008  . Breast biopsy Left 08-20-14  . Breast surgery Left 09-05-14    left breast lumpectomy with SLN biopsy, ER/PR positive. HER2 negative.   Family History  Problem Relation Age of Onset  . Cancer Father     Prostate  . Hyperlipidemia Other     Parent  . Miscarriages / Stillbirths Other    Parent  . Hypertension Other     parent  . Heart disease Other     Parent  . Breast cancer Maternal Aunt    Social History   Social History  . Marital Status: Married    Spouse Name: N/A  . Number of Children: 3  . Years of Education: 12   Occupational History  . Caregiver     Social History Main Topics  . Smoking status: Never Smoker   . Smokeless tobacco: Never Used  . Alcohol Use: No  . Drug Use: No  . Sexual Activity: No   Other Topics Concern  . None   Social History Narrative   Regular exercise-mo   Caffeine Use-no    Outpatient Encounter Prescriptions as of 05/08/2015  Medication Sig  . aspirin (BAYER ASPIRIN) 325 MG tablet Take 325 mg by mouth daily.  [name] letrozole (FEMARA) 2.5 MG tablet Take 1 tablet (2.5 mg total) by mouth daily.  [name] losartan-hydrochlorothiazide (HYZAAR) 100-25 MG per tablet TAKE 1 TABLET BY MOUTH DAILY.  . metoprolol (LOPRESSOR) 50 MG tablet TAKE 1 TABLET BY MOUTH EVERY DAY  . vitamin E 400 UNIT capsule Take 400 Units  by mouth daily.   No facility-administered encounter medications on file as of 05/08/2015.    Review of Systems  Constitutional: Negative for appetite change and unexpected weight change.  HENT: Negative for congestion and sinus pressure.   Eyes: Negative for discharge and redness.  Respiratory: Positive for chest tightness. Negative for cough.        Some sob with exertion.   Cardiovascular: Positive for chest pain. Negative for palpitations and leg swelling.  Gastrointestinal: Negative for nausea, vomiting, abdominal pain and diarrhea.  Genitourinary: Negative for dysuria and difficulty urinating.  Musculoskeletal: Negative for back pain.       Trigger finger as outlined - right third finger.    Skin: Negative for color change and rash.  Neurological: Negative for dizziness, light-headedness and headaches.  Psychiatric/Behavioral: Negative for dysphoric mood and agitation.       Objective:     Blood pressure  rechecked by me:  142/78  Physical Exam  Constitutional: She appears well-nourished. No distress.  HENT:  Nose: Nose normal.  Mouth/Throat: Oropharynx is clear and moist.  Neck: Neck supple.  Cardiovascular: Normal rate and regular rhythm.   Pulmonary/Chest: Breath sounds normal. No respiratory distress. She has no wheezes.  Abdominal: Soft. Bowel sounds are normal. There is no tenderness.  Musculoskeletal: She exhibits no edema or tenderness.  Lymphadenopathy:    She has no cervical adenopathy.  Skin: No rash noted. No erythema.  Psychiatric: She has a normal mood and affect. Her behavior is normal.    BP 151/73 mmHg  Pulse 64  Temp(Src) 98 F (36.7 C) (Oral)  Ht '5\' 5"'$  (1.651 m)  Wt 198 lb 6 oz (89.982 kg)  BMI 33.01 kg/m2  SpO2 96% Wt Readings from Last 3 Encounters:  05/08/15 198 lb 6 oz (89.982 kg)  03/12/15 197 lb (89.359 kg)  02/08/15 196 lb 12 oz (89.245 kg)     Lab Results  Component Value Date   WBC 6.0 02/08/2015   HGB 13.5 02/08/2015   HCT 38.4 02/08/2015   PLT 201 02/08/2015   GLUCOSE 78 02/08/2015   CHOL 185 01/01/2015   TRIG 174.0* 01/01/2015   HDL 42.10 01/01/2015   LDLDIRECT 132.1 06/13/2013   LDLCALC 108* 01/01/2015   ALT 22 02/14/2015   AST 16 02/14/2015   NA 138 02/08/2015   K 3.9 02/08/2015   CL 105 02/08/2015   CREATININE 0.89 02/08/2015   BUN 24* 02/08/2015   CO2 26 02/08/2015   TSH 5.15* 02/14/2015   INR 0.9 07/13/2012   HGBA1C 6.2 01/01/2015    Dg Chest 2 View  02/08/2015  CLINICAL DATA:  Chest pain, swelling all over body, numbness in fingers, head pressure and blurred vision for 3 days, LEFT breast cancer diagnosed this year, hypertension EXAM: CHEST  2 VIEW COMPARISON:  07/13/2012 FINDINGS: Normal heart size, mediastinal contours, and pulmonary vascularity. Scattered atherosclerotic calcifications. Lungs appear hyperinflated with RIGHT upper lobe volume loss and slight superior retraction of RIGHT hilum. No pulmonary infiltrate,  pleural effusion or pneumothorax. Diffuse osseous demineralization. IMPRESSION: No acute abnormalities. Electronically Signed   By: Lavonia Dana M.D.   On: 02/08/2015 14:38   Ct Head Wo Contrast  02/08/2015  CLINICAL DATA:  Headache.  Blurred vision. EXAM: CT HEAD WITHOUT CONTRAST TECHNIQUE: Contiguous axial images were obtained from the base of the skull through the vertex without intravenous contrast. COMPARISON:  10/23/2013 FINDINGS: No mass lesion. No midline shift. No acute hemorrhage or hematoma. No extra-axial fluid collections. No evidence  of acute infarction. Brain parenchyma is normal. Osseous structures normal. IMPRESSION: Normal exam. Electronically Signed   By: Lorriane Shire M.D.   On: 02/08/2015 16:40       Assessment & Plan:   Problem List Items Addressed This Visit    Abnormal liver function test    Check liver panel with next labs.       Relevant Orders   Hepatic function panel   Breast cancer ([place])    On femara.  Seeing Dr Jamal Collin.  Just evaluated 03/2015.  Recommended 6 month f/u.        CAD (coronary artery disease)    Has known CAD.  With intermittent chest pressure.  EKG not obtained today.  She preferred not to have another EKG.  No pain or tightness now.  Discussed the need for further evaluation.  Refer to cardiology for further evaluation and treatment.        Relevant Orders   Ambulatory referral to Cardiology   Chest pain - Primary    Chest pain as outlined.  Refer to cardiology as outlined.        Relevant Orders   Ambulatory referral to Cardiology   Hypercholesterolemia    Low cholesterol diet and exercise.  Follow lipid panel.  Check with next labs.  Will need to restart cholesterol medication.  No change in her aching.      Relevant Orders   Lipid panel   Hyperglycemia    Low carb diet and exercise.  Follow met b and a1c.        Relevant Orders   Hemoglobin A1c   Hypertension    Blood pressure as outlined.  Slightly elevated.  Has been  overall stable per her report.  Continue same medication regimen.  Follow pressures.  Follow metabolic panel.        Relevant Orders   Basic metabolic panel   Peripheral vascular disease ([place])    S/p stent placement.  Followed by Dr Lucky Cowboy.  Currently doing well.  Continue risk factor modification.        SOB (shortness of breath)    Has noticed some sob with exertion.  Refer to cardiology as outlined.        Relevant Orders   Ambulatory referral to Cardiology   Thyroid nodule    Biopsy negative.  Was followed by Dr Gabriel Carina.        Relevant Orders   TSH   Trigger finger, right    Right trigger finger.  Refer to Dr Jefm Bryant.        Relevant Orders   Ambulatory referral to Rheumatology       Einar Pheasant, MD

## 2015-05-08 NOTE — Progress Notes (Signed)
Pre visit review using our clinic review tool, if applicable. No additional management support is needed unless otherwise documented below in the visit note. 

## 2015-05-13 ENCOUNTER — Encounter: Payer: Self-pay | Admitting: Internal Medicine

## 2015-05-13 NOTE — Assessment & Plan Note (Signed)
On femara.  Seeing Dr Jamal Collin.  Just evaluated 03/2015.  Recommended 6 month f/u.

## 2015-05-13 NOTE — Assessment & Plan Note (Signed)
Has known CAD.  With intermittent chest pressure.  EKG not obtained today.  She preferred not to have another EKG.  No pain or tightness now.  Discussed the need for further evaluation.  Refer to cardiology for further evaluation and treatment.

## 2015-05-13 NOTE — Assessment & Plan Note (Signed)
Check liver panel with next labs.

## 2015-05-13 NOTE — Assessment & Plan Note (Signed)
Right trigger finger.  Refer to Dr Jefm Bryant.

## 2015-05-13 NOTE — Assessment & Plan Note (Signed)
Blood pressure as outlined.  Slightly elevated.  Has been overall stable per her report.  Continue same medication regimen.  Follow pressures.  Follow metabolic panel.

## 2015-05-13 NOTE — Assessment & Plan Note (Signed)
S/p stent placement.  Followed by Dr Lucky Cowboy.  Currently doing well.  Continue risk factor modification.

## 2015-05-13 NOTE — Assessment & Plan Note (Signed)
Chest pain as outlined.  Refer to cardiology as outlined.

## 2015-05-13 NOTE — Assessment & Plan Note (Signed)
Biopsy negative.  Was followed by Dr Gabriel Carina.

## 2015-05-13 NOTE — Assessment & Plan Note (Signed)
Has noticed some sob with exertion.  Refer to cardiology as outlined.

## 2015-05-13 NOTE — Assessment & Plan Note (Signed)
Low cholesterol diet and exercise.  Follow lipid panel.  Check with next labs.  Will need to restart cholesterol medication.  No change in her aching.

## 2015-05-13 NOTE — Assessment & Plan Note (Signed)
Low carb diet and exercise.  Follow met b and a1c.   

## 2015-05-14 ENCOUNTER — Telehealth: Payer: Self-pay | Admitting: *Deleted

## 2015-05-14 NOTE — Telephone Encounter (Signed)
She states she noticed these symptoms after she started taking the Femara. She has talked with Dr Nicki Reaper as well.  just wanted to know is there another medication she can take?

## 2015-05-14 NOTE — Telephone Encounter (Signed)
Patient called and she is taking Femara and since she has been taking this, she is experiencing Body/Joint aches, swelling in the face,eyes,ankle, hands, and knees. She said the swelling is off and on. She just wanted to know is there another medication she can take. She believes these symptoms are coming from the medication.

## 2015-05-16 ENCOUNTER — Other Ambulatory Visit (INDEPENDENT_AMBULATORY_CARE_PROVIDER_SITE_OTHER): Payer: Medicare Other

## 2015-05-16 DIAGNOSIS — R7989 Other specified abnormal findings of blood chemistry: Secondary | ICD-10-CM

## 2015-05-16 DIAGNOSIS — R739 Hyperglycemia, unspecified: Secondary | ICD-10-CM | POA: Diagnosis not present

## 2015-05-16 DIAGNOSIS — R945 Abnormal results of liver function studies: Secondary | ICD-10-CM

## 2015-05-16 DIAGNOSIS — E041 Nontoxic single thyroid nodule: Secondary | ICD-10-CM

## 2015-05-16 DIAGNOSIS — E7800 Pure hypercholesterolemia, unspecified: Secondary | ICD-10-CM | POA: Diagnosis not present

## 2015-05-16 DIAGNOSIS — I10 Essential (primary) hypertension: Secondary | ICD-10-CM

## 2015-05-16 LAB — LIPID PANEL
CHOL/HDL RATIO: 8
CHOLESTEROL: 300 mg/dL — AB (ref 0–200)
HDL: 38 mg/dL — ABNORMAL LOW (ref >39.00)
NONHDL: 261.86
Triglycerides: 309 mg/dL — ABNORMAL HIGH (ref 0.0–149.0)
VLDL: 61.8 mg/dL — AB (ref 0.0–40.0)

## 2015-05-16 LAB — BASIC METABOLIC PANEL
BUN: 21 mg/dL (ref 6–23)
CALCIUM: 9.7 mg/dL (ref 8.4–10.5)
CO2: 26 mEq/L (ref 19–32)
Chloride: 101 mEq/L (ref 96–112)
Creatinine, Ser: 0.93 mg/dL (ref 0.40–1.20)
GFR: 63.21 mL/min (ref >60.00)
GLUCOSE: 99 mg/dL (ref 70–99)
POTASSIUM: 4.6 meq/L (ref 3.5–5.1)
SODIUM: 138 meq/L (ref 135–145)

## 2015-05-16 LAB — HEMOGLOBIN A1C: Hgb A1c MFr Bld: 6.2 % (ref 4.6–6.5)

## 2015-05-16 LAB — HEPATIC FUNCTION PANEL
ALT: 18 U/L (ref 0–35)
AST: 15 U/L (ref 0–37)
Albumin: 4.3 g/dL (ref 3.5–5.2)
Alkaline Phosphatase: 55 U/L (ref 39–117)
BILIRUBIN DIRECT: 0.1 mg/dL (ref 0.0–0.3)
TOTAL PROTEIN: 7.2 g/dL (ref 6.0–8.3)
Total Bilirubin: 0.7 mg/dL (ref 0.2–1.2)

## 2015-05-16 LAB — TSH: TSH: 4.34 u[IU]/mL (ref 0.35–4.50)

## 2015-05-16 LAB — LDL CHOLESTEROL, DIRECT: Direct LDL: 206 mg/dL

## 2015-05-17 ENCOUNTER — Encounter: Payer: Self-pay | Admitting: *Deleted

## 2015-05-22 DIAGNOSIS — R0602 Shortness of breath: Secondary | ICD-10-CM | POA: Diagnosis not present

## 2015-05-22 DIAGNOSIS — R0789 Other chest pain: Secondary | ICD-10-CM | POA: Diagnosis not present

## 2015-05-23 ENCOUNTER — Ambulatory Visit
Admission: RE | Admit: 2015-05-23 | Discharge: 2015-05-23 | Disposition: A | Discharge status: Home / Self Care | Payer: Medicare Other | Source: Ambulatory Visit | Attending: Radiation Oncology | Admitting: Radiation Oncology

## 2015-05-23 ENCOUNTER — Encounter: Payer: Self-pay | Admitting: Radiation Oncology

## 2015-05-23 ENCOUNTER — Telehealth: Payer: Self-pay | Admitting: Internal Medicine

## 2015-05-23 VITALS — BP 182/77 | HR 61 | Temp 97.4°F | Resp 18 | Ht 65.0 in | Wt 196.5 lb

## 2015-05-23 DIAGNOSIS — C50512 Malignant neoplasm of lower-outer quadrant of left female breast: Secondary | ICD-10-CM | POA: Diagnosis not present

## 2015-05-23 DIAGNOSIS — Z853 Personal history of malignant neoplasm of breast: Secondary | ICD-10-CM

## 2015-05-23 NOTE — Progress Notes (Signed)
Radiation Oncology Follow up Note  Name: Lindsey Bentley   Date:   05/23/2015 MRN:  [id number] DOB: 11-13-1945    This 70 y.o. female presents to the clinic today for follow-up for breast cancer stage IA ER/PR positive status post accelerated partial breast radiation to her left breast now out 7 months.Lindsey Bentley PROVIDER: Einar Pheasant, MD  HPI: Patient is a 70 year old female now out 7 months having completed accelerated partial breast radiation to her left breast for stage I a ER/PR positive invasive mammary carcinoma. She seen today in routine follow-up and is doing well.. She specifically denies breast tenderness cough or bone pain. She had a mammogram back in October which was fine. She is currently on letrozole although this is causing some cord to the patient joint pain swelling of her lower extremities and some itching and spots on her skin. She is seen Dr. Jamal Collin about these in the near future  COMPLICATIONS OF TREATMENT: none  FOLLOW UP COMPLIANCE: keeps appointments   PHYSICAL EXAM:  BP 182/77 mmHg  Pulse 61  Temp(Src) 97.4 F (36.3 C)  Resp 18  Ht 5\' 5"  (1.651 m)  Wt 196 lb 8.6 oz (89.15 kg)  BMI 32.71 kg/m2 Lungs are clear to A&P cardiac examination essentially unremarkable with regular rate and rhythm. No dominant mass or nodularity is noted in either breast in 2 positions examined. Incision is well-healed. No axillary or supraclavicular adenopathy is appreciated. Cosmetic result is excellent. Patient does have firmness in the lumpectomy site which we attributed to high dose rate remote afterloading and scarring. Well-developed well-nourished patient in NAD. HEENT reveals PERLA, EOMI, discs not visualized.  Oral cavity is clear. No oral mucosal lesions are identified. Neck is clear without evidence of cervical or supraclavicular adenopathy. Lungs are clear to A&P. Cardiac examination is essentially unremarkable with regular rate and rhythm without murmur rub or thrill.  Abdomen is benign with no organomegaly or masses noted. Motor sensory and DTR levels are equal and symmetric in the upper and lower extremities. Cranial nerves II through XII are grossly intact. Proprioception is intact. No peripheral adenopathy or edema is identified. No motor or sensory levels are noted. Crude visual fields are within normal range.  RADIOLOGY RESULTS: Mammograms are reviewed  PLAN: At the present time she is doing well with no evidence of disease. She will contact surgeon's office about letrozole and a and side effects. Possible replacement may be offered. Otherwise I'm please were overall progress I've asked to see her back in 1 year for follow-up. She knows to call sooner with any concerns.    I would like to take this opportunity for allowing me to participate in the care of your patient.Armstead Peaks., MD

## 2015-05-23 NOTE — Telephone Encounter (Signed)
Pt called stating that she had a message to let Dr. Nicki Reaper know if she wanted to start taking Crestor 10mg  pt would like the generic version .[name] Pt stated that she would like to start taking it.. Please advise pt @ [phone].[name] Pt uses [id number] - [place], [place]

## 2015-05-23 NOTE — Telephone Encounter (Signed)
Please advise 

## 2015-05-24 MED ORDER — ROSUVASTATIN CALCIUM 10 MG PO TABS: 10.0000 mg | ORAL_TABLET | Freq: Every day | ORAL | Status: DC

## 2015-05-24 NOTE — Telephone Encounter (Signed)
I have sent in rx for crestor.  She will need liver panel checked in 6 weeks.

## 2015-05-24 NOTE — Telephone Encounter (Signed)
Pt had swelling in her ankle the last time she took the Crestor, but was not sure if that was the cause.She has been off of it for about 4-5 months now & still has some swelling from time to time. Pt states that she is willing to try it again. On your last lab note to patient, you advised her to restart Crestor. That's why she called.

## 2015-05-24 NOTE — Telephone Encounter (Signed)
We have a documented allergy to crestor - documented swelling.  Did she have swelling with the crestor?  If so, what type of swelling did she experience?  Need to know exactly why she stopped the crestor previously.

## 2015-05-24 NOTE — Telephone Encounter (Signed)
Pt notified & lab appt scheduled 

## 2015-05-27 DIAGNOSIS — M65331 Trigger finger, right middle finger: Secondary | ICD-10-CM | POA: Diagnosis not present

## 2015-05-29 DIAGNOSIS — E782 Mixed hyperlipidemia: Secondary | ICD-10-CM | POA: Diagnosis not present

## 2015-05-29 DIAGNOSIS — I10 Essential (primary) hypertension: Secondary | ICD-10-CM | POA: Diagnosis not present

## 2015-05-30 ENCOUNTER — Telehealth: Payer: Self-pay | Admitting: Internal Medicine

## 2015-05-30 NOTE — Telephone Encounter (Signed)
Spoke with the patient , Dr. Nehemiah Massed (Spelling?) cardiologist changed her medications, took her off Metoprolol and started her on Carvedilol 3.125mg  one tablet twice a day.  She wants to make sure you are on the same page.   At the same time she requested to know if she can take a OTC Vitamin D pill.  Please advise.  Thanks

## 2015-05-30 NOTE — Telephone Encounter (Signed)
Pt called with a question regarding her cardiologist changing her medication she wants to make sure it's ok with Dr Nicki Reaper. Medication is Carvedilol. Call pt @ [phone]. Thank You!

## 2015-05-30 NOTE — Telephone Encounter (Signed)
Spoke with the patient. Verbalized understanding of changes.

## 2015-05-30 NOTE — Telephone Encounter (Signed)
I am ok with cardiology's change.  Also, can start vitamin D3 1000 units q day.

## 2015-06-13 ENCOUNTER — Encounter: Payer: Self-pay | Admitting: *Deleted

## 2015-06-21 ENCOUNTER — Encounter: Payer: Self-pay | Admitting: *Deleted

## 2015-06-26 DIAGNOSIS — I10 Essential (primary) hypertension: Secondary | ICD-10-CM | POA: Diagnosis not present

## 2015-06-26 DIAGNOSIS — E782 Mixed hyperlipidemia: Secondary | ICD-10-CM | POA: Diagnosis not present

## 2015-06-26 DIAGNOSIS — I119 Hypertensive heart disease without heart failure: Secondary | ICD-10-CM | POA: Insufficient documentation

## 2015-07-03 ENCOUNTER — Telehealth: Payer: Self-pay | Admitting: Internal Medicine

## 2015-07-03 NOTE — Telephone Encounter (Signed)
Notified pt of Dr. Bary Leriche comment and pt states that her bp is coming down that at 2:30 pm 145/77 Pulse: 68.

## 2015-07-03 NOTE — Telephone Encounter (Signed)
Since I am not in the office this pm and the rest of the week, agree with making sure cardiology aware of her pressure.  She needs to give them a call back if not heard.  Would f/u with him.  If unable to get response today, then recommend [facility] evaluation for elevated bp.

## 2015-07-03 NOTE — Telephone Encounter (Signed)
Much better.  Still would f/u with cardiology as instructed.

## 2015-07-03 NOTE — Telephone Encounter (Signed)
Please advise, FYI. thanks

## 2015-07-03 NOTE — Telephone Encounter (Signed)
The patient was seen at [facility] by Dr. Nehemiah Massed 2.15.17 , he changed her blood pressure medication to Telmisartan/HCTZ 80-25 mg tab once a day. She was taken off her Losartan. Her blood pressure is still running high on today 7:30am 204/90 pulse 61, after taking her medication at  9:00 it was 199/88 pulse 62, 9:45 196/77 pulse 93. She has been on this medication since 2.16.17. She has put in a call to Dr. Alveria Apley office . She wants to make sure that Dr. Nicki Reaper is aware.

## 2015-07-04 ENCOUNTER — Telehealth: Payer: Self-pay | Admitting: *Deleted

## 2015-07-04 DIAGNOSIS — R7989 Other specified abnormal findings of blood chemistry: Secondary | ICD-10-CM

## 2015-07-04 DIAGNOSIS — I10 Essential (primary) hypertension: Secondary | ICD-10-CM

## 2015-07-04 DIAGNOSIS — R945 Abnormal results of liver function studies: Secondary | ICD-10-CM

## 2015-07-04 NOTE — Telephone Encounter (Signed)
Needs lab orders placed. Pt coming in for labs tomorrow.

## 2015-07-05 ENCOUNTER — Other Ambulatory Visit (INDEPENDENT_AMBULATORY_CARE_PROVIDER_SITE_OTHER): Payer: Medicare Other

## 2015-07-05 DIAGNOSIS — I10 Essential (primary) hypertension: Secondary | ICD-10-CM

## 2015-07-05 DIAGNOSIS — R7989 Other specified abnormal findings of blood chemistry: Secondary | ICD-10-CM | POA: Diagnosis not present

## 2015-07-05 DIAGNOSIS — R945 Abnormal results of liver function studies: Secondary | ICD-10-CM

## 2015-07-05 LAB — BASIC METABOLIC PANEL
BUN: 25 mg/dL — ABNORMAL HIGH (ref 6–23)
CALCIUM: 9.6 mg/dL (ref 8.4–10.5)
CO2: 27 mEq/L (ref 19–32)
Chloride: 105 mEq/L (ref 96–112)
Creatinine, Ser: 0.92 mg/dL (ref 0.40–1.20)
GFR: 63.98 mL/min (ref >60.00)
Glucose, Bld: 123 mg/dL — ABNORMAL HIGH (ref 70–99)
POTASSIUM: 3.8 meq/L (ref 3.5–5.1)
SODIUM: 140 meq/L (ref 135–145)

## 2015-07-05 LAB — HEPATIC FUNCTION PANEL
ALBUMIN: 4.3 g/dL (ref 3.5–5.2)
ALT: 19 U/L (ref 0–35)
AST: 14 U/L (ref 0–37)
Alkaline Phosphatase: 49 U/L (ref 39–117)
Bilirubin, Direct: 0.1 mg/dL (ref 0.0–0.3)
TOTAL PROTEIN: 7.2 g/dL (ref 6.0–8.3)
Total Bilirubin: 0.7 mg/dL (ref 0.2–1.2)

## 2015-07-05 NOTE — Telephone Encounter (Signed)
I have placed the order for labs.  I did not actually have her scheduled, but she started a new cholesterol medication will check liver panel.  She was recently seen by cardiology.  I have placed lab orders, but please confirm with pt that she is not aware of any other labs that cardiology would need.  (I ordered met b and liver panel).  Thanks.

## 2015-07-05 NOTE — Telephone Encounter (Signed)
Pt unaware

## 2015-07-08 ENCOUNTER — Encounter: Payer: Self-pay | Admitting: *Deleted

## 2015-07-19 ENCOUNTER — Encounter: Payer: Self-pay | Admitting: Internal Medicine

## 2015-07-19 ENCOUNTER — Ambulatory Visit (INDEPENDENT_AMBULATORY_CARE_PROVIDER_SITE_OTHER): Payer: Medicare Other | Admitting: Internal Medicine

## 2015-07-19 VITALS — BP 130/70 | HR 75 | Temp 97.8°F | Resp 18 | Ht 65.0 in | Wt 196.5 lb

## 2015-07-19 DIAGNOSIS — Z658 Other specified problems related to psychosocial circumstances: Secondary | ICD-10-CM

## 2015-07-19 DIAGNOSIS — I25119 Atherosclerotic heart disease of native coronary artery with unspecified angina pectoris: Secondary | ICD-10-CM | POA: Diagnosis not present

## 2015-07-19 DIAGNOSIS — I10 Essential (primary) hypertension: Secondary | ICD-10-CM

## 2015-07-19 DIAGNOSIS — F439 Reaction to severe stress, unspecified: Secondary | ICD-10-CM

## 2015-07-19 DIAGNOSIS — R945 Abnormal results of liver function studies: Secondary | ICD-10-CM

## 2015-07-19 DIAGNOSIS — R7989 Other specified abnormal findings of blood chemistry: Secondary | ICD-10-CM

## 2015-07-19 DIAGNOSIS — C50912 Malignant neoplasm of unspecified site of left female breast: Secondary | ICD-10-CM

## 2015-07-19 DIAGNOSIS — I739 Peripheral vascular disease, unspecified: Secondary | ICD-10-CM

## 2015-07-19 DIAGNOSIS — R739 Hyperglycemia, unspecified: Secondary | ICD-10-CM

## 2015-07-19 DIAGNOSIS — E041 Nontoxic single thyroid nodule: Secondary | ICD-10-CM

## 2015-07-19 DIAGNOSIS — E7800 Pure hypercholesterolemia, unspecified: Secondary | ICD-10-CM

## 2015-07-19 NOTE — Progress Notes (Signed)
[name], female   DOB: 1945-08-05, 70 y.o.   MRN: [id number]   Subjective:    [name], female    DOB: 05-21-1945, 70 y.o.   MRN: [id number]  HPI  Patient with past history of hypercholesterolemia, breast cancer on femara, GERD, PVD and hypertension.  She comes in today to follow up on these issues as well as for a physical exam.  She has seen Dr Nehemiah Massed recently.  See his note for details.  Trying to adjust her blood pressure medication.  She is back on losartan/hctz.  Taking coreg 1/2 tablet bid.  Blood pressure averaging 379-024O systolic readings.  Yesterday 155/66.  No headache or light headedness.  No sob.  No chest pain.  No nausea or vomiting.  Bowels stable.  Handling stress relatively well.  Occasional hot flash.  Relates to the femara.     Past Medical History  Diagnosis Date  . Depression   . History of chicken pox   . Glaucoma   . GERD (gastroesophageal reflux disease)   . Hyperlipidemia   . Hypertension   . Peripheral vascular disease ([place])   . Diffuse cystic mastopathy 2013  . High cholesterol   . Pneumonia March 2014  . Cancer (HCC)     Breast cancer - TI, NO, [place] - IDC, ER/PR pos, Her 2 neg   Past Surgical History  Procedure Laterality Date  . Cholecystectomy  2006  . Leg stent  2011  . Back surgery  1986    ruptured disc  . Dilation and curettage of uterus    . Colonoscopy  2010    Dr. Jamal Collin  . Mole removal  2013    15 removed  . Breast biopsy  2008  . Breast biopsy Left 08-20-14  . Breast surgery Left 09-05-14    left breast lumpectomy with SLN biopsy, ER/PR positive. HER2 negative.   Family History  Problem Relation Age of Onset  . Cancer Father     Prostate  . Hyperlipidemia Other     Parent  . Miscarriages / Stillbirths Other     Parent  . Hypertension Other     parent  . Heart disease Other     Parent  . Breast cancer Maternal Aunt    Social History   Social History  . Marital Status: Married    Spouse  Name: N/A  . Number of Children: 3  . Years of Education: 12   Occupational History  . Caregiver     Social History Main Topics  . Smoking status: Never Smoker   . Smokeless tobacco: Never Used  . Alcohol Use: No  . Drug Use: No  . Sexual Activity: No   Other Topics Concern  . None   Social History Narrative   Regular exercise-mo   Caffeine Use-no    Outpatient Encounter Prescriptions as of 07/19/2015  Medication Sig  . aspirin (BAYER ASPIRIN) 325 MG tablet Take 325 mg by mouth daily.  . carvedilol (COREG) 3.125 MG tablet Take by mouth.  . Cholecalciferol (VITAMIN D-1000 MAX ST) 1000 units tablet Take by mouth.  . letrozole (FEMARA) 2.5 MG tablet Take 1 tablet (2.5 mg total) by mouth daily.  [name] losartan-hydrochlorothiazide (HYZAAR) 100-25 MG per tablet TAKE 1 TABLET BY MOUTH DAILY.  . rosuvastatin (CRESTOR) 10 MG tablet Take 1 tablet (10 mg total) by mouth daily.  . vitamin E 400 UNIT capsule Take 400 Units by mouth daily.  . [DISCONTINUED]  metoprolol (LOPRESSOR) 50 MG tablet TAKE 1 TABLET BY MOUTH EVERY DAY   No facility-administered encounter medications on file as of 07/19/2015.    Review of Systems  Constitutional: Negative for appetite change and unexpected weight change.  HENT: Negative for congestion and sinus pressure.   Eyes: Negative for pain and visual disturbance.  Respiratory: Negative for cough, chest tightness and shortness of breath.   Cardiovascular: Negative for chest pain, palpitations and leg swelling.  Gastrointestinal: Negative for nausea, vomiting, abdominal pain and diarrhea.  Genitourinary: Negative for dysuria and difficulty urinating.  Musculoskeletal: Negative for back pain and joint swelling.  Skin: Negative for color change and rash.  Neurological: Negative for dizziness, light-headedness and headaches.  Hematological: Negative for adenopathy. Does not bruise/bleed easily.  Psychiatric/Behavioral: Negative for dysphoric mood and agitation.        Objective:     Blood pressure rechecked by me:  156/78, pulse 76-80  Physical Exam  Constitutional: She appears well-developed and well-nourished. No distress.  HENT:  Nose: Nose normal.  Mouth/Throat: Oropharynx is clear and moist.  Eyes: Conjunctivae are normal. Right eye exhibits no discharge. Left eye exhibits no discharge.  Neck: Neck supple. No thyromegaly present.  Cardiovascular: Normal rate and regular rhythm.   Pulmonary/Chest: Breath sounds normal. No respiratory distress. She has no wheezes.  Breast exam - no nipple discharge or nipple retraction present.  Palpable nodule 5:00 left breast.  Pt states is unchanged and that Dr Jamal Collin has evaluated.  No axillary adenopathy noted.  No right breast nodules appreciated.    Abdominal: Soft. Bowel sounds are normal. There is no tenderness.  Musculoskeletal: She exhibits no edema or tenderness.  Lymphadenopathy:    She has no cervical adenopathy.  Skin: No rash noted. No erythema.  Psychiatric: She has a normal mood and affect. Her behavior is normal.    BP 130/70 mmHg  Pulse 75  Temp(Src) 97.8 F (36.6 C) (Oral)  Resp 18  Ht _0  (1.651 m)  Wt 196 lb 8 oz (89.132 kg)  BMI 32.70 kg/m2  SpO2 95% Wt Readings from Last 3 Encounters:  07/19/15 196 lb 8 oz (89.132 kg)  05/23/15 196 lb 8.6 oz (89.15 kg)  05/08/15 198 lb 6 oz (89.982 kg)     Lab Results  Component Value Date   WBC 6.0 02/08/2015   HGB 13.5 02/08/2015   HCT 38.4 02/08/2015   PLT 201 02/08/2015   GLUCOSE 123* 07/05/2015   CHOL 300* 05/16/2015   TRIG 309.0* 05/16/2015   HDL 38.00* 05/16/2015   LDLDIRECT 206.0 05/16/2015   LDLCALC 108* 01/01/2015   ALT 19 07/05/2015   AST 14 07/05/2015   NA 140 07/05/2015   K 3.8 07/05/2015   CL 105 07/05/2015   CREATININE 0.92 07/05/2015   BUN 25* 07/05/2015   CO2 27 07/05/2015   TSH 4.34 05/16/2015   INR 0.9 07/13/2012   HGBA1C 6.2 05/16/2015       Assessment & Plan:   Problem List Items  Addressed This Visit    Abnormal liver function test    Recent liver panel wnl.        Breast cancer ([place])    On femara.  Sees Dr Jamal Collin.  Due f/u mammogram 09/2015.        CAD (coronary artery disease)    Has known CAD.  No chest pain.  Recently evaluated by cardiology.  Continue risk factor modification.        Relevant Medications   carvedilol (  COREG) 3.125 MG tablet   Hypercholesterolemia    On crestor and tolerating.  Follow lipid panel and liver function tests.  Recent liver panel wnl.  Low cholesterol diet and exercise.        Relevant Medications   carvedilol (COREG) 3.125 MG tablet   Other Relevant Orders   Lipid panel   Hepatic function panel   Hyperglycemia    Low carb diet and exercise.  Follow met b and a1c.        Relevant Orders   Hemoglobin A1c   Hypertension    Blood pressure still elevated.  Increase coreg to one tablet bid.  Has f/u with cardiology next week.  Will f/u regarding her blood pressure at that time.  Follow metabolic panel.        Relevant Medications   carvedilol (COREG) 3.125 MG tablet   Other Relevant Orders   Basic metabolic panel   Peripheral vascular disease ([place]) - Primary    Followed by Dr Lucky Cowboy.  S/p stent placement.  Currently doing well.  Continue risk factor modification.        Relevant Medications   carvedilol (COREG) 3.125 MG tablet   Stress    Feels handling stress relatively well.  Follow.        Thyroid nodule    Was followed by Dr Gabriel Carina.  Biopsy negative.  Follow thyroid function tests.       Relevant Medications   carvedilol (COREG) 3.125 MG tablet   Other Relevant Orders   TSH       Einar Pheasant, MD

## 2015-07-19 NOTE — Progress Notes (Signed)
Pre-visit discussion using our clinic review tool. No additional management support is needed unless otherwise documented below in the visit note.  

## 2015-07-21 ENCOUNTER — Encounter: Payer: Self-pay | Admitting: Internal Medicine

## 2015-07-21 NOTE — Assessment & Plan Note (Signed)
Was followed by Dr Solum.  Biopsy negative.  Follow thyroid function tests.   

## 2015-07-21 NOTE — Assessment & Plan Note (Signed)
Blood pressure still elevated.  Increase coreg to one tablet bid.  Has f/u with cardiology next week.  Will f/u regarding her blood pressure at that time.  Follow metabolic panel.

## 2015-07-21 NOTE — Assessment & Plan Note (Signed)
Recent liver panel wnl.  

## 2015-07-21 NOTE — Assessment & Plan Note (Signed)
Has known CAD.  No chest pain.  Recently evaluated by cardiology.  Continue risk factor modification.

## 2015-07-21 NOTE — Assessment & Plan Note (Signed)
Followed by Dr Lucky Cowboy.  S/p stent placement.  Currently doing well.  Continue risk factor modification.

## 2015-07-21 NOTE — Assessment & Plan Note (Signed)
On femara.  Sees Dr Jamal Collin.  Due f/u mammogram 09/2015.

## 2015-07-21 NOTE — Assessment & Plan Note (Signed)
On crestor and tolerating.  Follow lipid panel and liver function tests.  Recent liver panel wnl.  Low cholesterol diet and exercise.

## 2015-07-21 NOTE — Assessment & Plan Note (Signed)
Low carb diet and exercise.  Follow met b and a1c.   

## 2015-07-21 NOTE — Assessment & Plan Note (Signed)
Feels handling stress relatively well.  Follow.

## 2015-07-22 ENCOUNTER — Other Ambulatory Visit: Payer: Self-pay | Admitting: Internal Medicine

## 2015-07-31 DIAGNOSIS — I119 Hypertensive heart disease without heart failure: Secondary | ICD-10-CM | POA: Diagnosis not present

## 2015-07-31 DIAGNOSIS — I10 Essential (primary) hypertension: Secondary | ICD-10-CM | POA: Diagnosis not present

## 2015-08-13 ENCOUNTER — Encounter: Payer: Self-pay | Admitting: General Surgery

## 2015-08-13 DIAGNOSIS — C50512 Malignant neoplasm of lower-outer quadrant of left female breast: Secondary | ICD-10-CM | POA: Diagnosis not present

## 2015-08-21 ENCOUNTER — Ambulatory Visit (INDEPENDENT_AMBULATORY_CARE_PROVIDER_SITE_OTHER): Payer: Medicare Other | Admitting: General Surgery

## 2015-08-21 ENCOUNTER — Ambulatory Visit: Payer: Medicare Other | Admitting: General Surgery

## 2015-08-21 VITALS — BP 180/82 | HR 74 | Resp 14 | Ht 64.0 in | Wt 202.0 lb

## 2015-08-21 DIAGNOSIS — C50512 Malignant neoplasm of lower-outer quadrant of left female breast: Secondary | ICD-10-CM | POA: Diagnosis not present

## 2015-08-21 NOTE — Patient Instructions (Addendum)
The patient has been asked to return to the office in 6 months left breast diagnostic mammogram.

## 2015-08-21 NOTE — Progress Notes (Signed)
[name], female   DOB: [birth year], 70 y.o.   MRN: [id number]  Chief Complaint  Patient presents with  . Follow-up    mammogram    HPI Lindsey Bentley is a 70 y.o. female who presents for a breast cancer follow up. The most recent mammogram was done on 08/13/15.  Patient does perform regular self breast checks and gets regular mammograms done.  I have reviewed the history of present illness with the patient.    HPI  Past Medical History  Diagnosis Date  . Depression   . History of chicken pox   . Glaucoma   . GERD (gastroesophageal reflux disease)   . Hyperlipidemia   . Hypertension   . Peripheral vascular disease ([place])   . Diffuse cystic mastopathy 2013  . High cholesterol   . Pneumonia March 2014  . Cancer (HCC)     Breast cancer - TI, NO, [place] - IDC, ER/PR pos, Her 2 neg    Past Surgical History  Procedure Laterality Date  . Cholecystectomy  2006  . Leg stent  2011  . Back surgery  1986    ruptured disc  . Dilation and curettage of uterus    . Colonoscopy  2010    Dr. Jamal Collin  . Mole removal  2013    15 removed  . Breast biopsy  2008  . Breast biopsy Left 08-20-14  . Breast surgery Left 09-05-14    left breast lumpectomy with SLN biopsy, ER/PR positive. HER2 negative.    Family History  Problem Relation Age of Onset  . Cancer Father     Prostate  . Hyperlipidemia Other     Parent  . Miscarriages / Stillbirths Other     Parent  . Hypertension Other     parent  . Heart disease Other     Parent  . Breast cancer Maternal Aunt     Social History Social History  Substance Use Topics  . Smoking status: Never Smoker   . Smokeless tobacco: Never Used  . Alcohol Use: No    Allergies  Allergen Reactions  . Codeine Nausea And Vomiting  . Crestor [Rosuvastatin Calcium] Swelling  . Prednisone Swelling  . Statins     Other reaction(s): Muscle Pain    Current Outpatient Prescriptions  Medication Sig Dispense Refill  . aspirin (BAYER  ASPIRIN) 325 MG tablet Take 325 mg by mouth daily.    . carvedilol (COREG) 3.125 MG tablet Take by mouth.    . Cholecalciferol (VITAMIN D-1000 MAX ST) 1000 units tablet Take by mouth.    . letrozole (FEMARA) 2.5 MG tablet Take 1 tablet (2.5 mg total) by mouth daily. 30 tablet 12  . losartan-hydrochlorothiazide (HYZAAR) 100-25 MG per tablet TAKE 1 TABLET BY MOUTH DAILY. 90 tablet 1  . rosuvastatin (CRESTOR) 10 MG tablet TAKE 1 TABLET(10 MG) BY MOUTH DAILY 30 tablet 11  . vitamin E 400 UNIT capsule Take 400 Units by mouth daily.     No current facility-administered medications for this visit.    Review of Systems Review of Systems  Constitutional: Negative.   Respiratory: Negative.   Cardiovascular: Negative.     Blood pressure 180/82, pulse 74, resp. rate 14, height '5\' 4"'$  (1.626 m), weight 202 lb (91.627 kg).  Physical Exam Physical Exam  Constitutional: She is oriented to person, place, and time. She appears well-developed and well-nourished.  Eyes: Conjunctivae are normal. No scleral icterus.  Neck: Neck supple.  Cardiovascular: Normal rate,  regular rhythm and normal heart sounds.   Pulmonary/Chest: Effort normal and breath sounds normal. Right breast exhibits no inverted nipple, no mass, no nipple discharge, no skin change and no tenderness. Left breast exhibits no inverted nipple, no mass, no nipple discharge, no skin change and no tenderness.  Firm area in her left breast lower outer quadrant.  Abdominal: Soft. Normal appearance and bowel sounds are normal. There is no hepatomegaly. There is no tenderness. No hernia.  Lymphadenopathy:    She has no cervical adenopathy.    She has no axillary adenopathy.  Neurological: She is alert and oriented to person, place, and time.  Skin: Skin is warm and dry.    Data Reviewed Mammogram reviewed  Assessment    CA, left breast lower outer quadrant. 1 year post lumpectomy and radiation. Currently on letrozole and doing well.      Plan    The patient has been asked to return to the office in 6 months with  left breast diagnostic mammogram.    PCP:  Einar Pheasant This information has been scribed by Gaspar Cola CMA.     Kishon Garriga G 08/26/2015, 1:27 PM

## 2015-08-26 ENCOUNTER — Encounter: Payer: Self-pay | Admitting: General Surgery

## 2015-08-29 ENCOUNTER — Other Ambulatory Visit: Payer: Self-pay | Admitting: Otolaryngology

## 2015-08-29 DIAGNOSIS — M542 Cervicalgia: Secondary | ICD-10-CM

## 2015-08-29 DIAGNOSIS — R221 Localized swelling, mass and lump, neck: Secondary | ICD-10-CM | POA: Diagnosis not present

## 2015-08-29 DIAGNOSIS — H6123 Impacted cerumen, bilateral: Secondary | ICD-10-CM | POA: Diagnosis not present

## 2015-09-05 ENCOUNTER — Ambulatory Visit
Admission: RE | Admit: 2015-09-05 | Discharge: 2015-09-05 | Disposition: A | Discharge status: Home / Self Care | Payer: Medicare Other | Source: Ambulatory Visit | Attending: Otolaryngology | Admitting: Otolaryngology

## 2015-09-05 DIAGNOSIS — R601 Generalized edema: Secondary | ICD-10-CM | POA: Insufficient documentation

## 2015-09-05 DIAGNOSIS — M542 Cervicalgia: Secondary | ICD-10-CM | POA: Diagnosis not present

## 2015-09-05 DIAGNOSIS — M2580 Other specified joint disorders, unspecified joint: Secondary | ICD-10-CM | POA: Insufficient documentation

## 2015-09-05 LAB — POCT I-STAT CREATININE: Creatinine, Ser: 0.9 mg/dL (ref 0.44–1.00)

## 2015-09-05 MED ORDER — IOPAMIDOL (ISOVUE-300) INJECTION 61%
75.0000 mL | Freq: Once | INTRAVENOUS | Status: AC | PRN
  Administered 2015-09-05: 75 mL via INTRAVENOUS

## 2015-09-12 ENCOUNTER — Telehealth: Payer: Self-pay | Admitting: Internal Medicine

## 2015-09-12 NOTE — Telephone Encounter (Signed)
Pt wanted to let Dr. Nicki Reaper know she was referred to a General Surgeon. Also, pt is having problems with her throat. She has had pneumonia in the past. Please advice

## 2015-09-12 NOTE — Telephone Encounter (Signed)
Patient c/o phlegm-yellow, sore throat, pain in chest in the past week, pain seems to have decreased yesterday and today, but she is concerned due to complications in the past. Patient is questioning what meds she can take regarding her past medical history also. Advised patient Dr. Bary Leriche CMA scheduled appt for OV in the morning. Patient was grateful.

## 2015-09-13 ENCOUNTER — Ambulatory Visit (INDEPENDENT_AMBULATORY_CARE_PROVIDER_SITE_OTHER): Payer: Medicare Other | Admitting: Internal Medicine

## 2015-09-13 ENCOUNTER — Encounter: Payer: Self-pay | Admitting: Internal Medicine

## 2015-09-13 VITALS — BP 138/70 | HR 69 | Temp 98.2°F | Resp 18 | Ht 64.0 in | Wt 199.8 lb

## 2015-09-13 DIAGNOSIS — J069 Acute upper respiratory infection, unspecified: Secondary | ICD-10-CM

## 2015-09-13 DIAGNOSIS — I10 Essential (primary) hypertension: Secondary | ICD-10-CM

## 2015-09-13 DIAGNOSIS — R42 Dizziness and giddiness: Secondary | ICD-10-CM

## 2015-09-13 MED ORDER — CEFUROXIME AXETIL 250 MG PO TABS: 250.0000 mg | ORAL_TABLET | Freq: Two times a day (BID) | ORAL | Status: DC

## 2015-09-13 NOTE — Progress Notes (Signed)
Pre-visit discussion using our clinic review tool. No additional management support is needed unless otherwise documented below in the visit note.  

## 2015-09-13 NOTE — Patient Instructions (Signed)
mucinex in the am and robitussin DM in the evening.    If you take the antibiotic, take a probiotic daily with the antibiotic and for two weeks after completing the antibiotic.

## 2015-09-13 NOTE — Progress Notes (Signed)
[name], female   DOB: Oct 07, 1945, 70 y.o.   MRN: [id number]   Subjective:    [name], female    DOB: 1945/08/23, 70 y.o.   MRN: [id number]  HPI  Patient here as a work in with concerns regarding sore throat and cough.  Cough is productive.  States symptoms started approximately one week ago.  Initially had scratchy throat and the lost her voice.  Progressed to increased cough.  Cough is productive of thick yellow mucus.  No wheezing.  No sob.  No vomiting.  No diarrhea.  She is eating.  Discussed staying hydrated.  Blood pressure has been doing better - averaging 130-140 systolic readings.  She has not been taking any otc medication.  Does report some dizziness.  Relates this to her neck.  Undergoing w/up for her neck.     Past Medical History  Diagnosis Date  . Depression   . History of chicken pox   . Glaucoma   . GERD (gastroesophageal reflux disease)   . Hyperlipidemia   . Hypertension   . Peripheral vascular disease (HCC)   . Diffuse cystic mastopathy 2013  . High cholesterol   . Pneumonia March 2014  . Cancer (HCC)     Breast cancer - TI, [place], [place] - IDC, ER/PR pos, Her 2 neg   Past Surgical History  Procedure Laterality Date  . Cholecystectomy  2006  . Leg stent  2011  . Back surgery  1986    ruptured disc  . Dilation and curettage of uterus    . Colonoscopy  2010    Dr. Evette Cristal  . Mole removal  2013    15 removed  . Breast biopsy  2008  . Breast biopsy Left 08-20-14  . Breast surgery Left 09-05-14    left breast lumpectomy with SLN biopsy, ER/PR positive. HER2 negative.   Family History  Problem Relation Age of Onset  . Cancer Father     Prostate  . Hyperlipidemia Other     Parent  . Miscarriages / Stillbirths Other     Parent  . Hypertension Other     parent  . Heart disease Other     Parent  . Breast cancer Maternal Aunt    Social History   Social History  . Marital Status: Married    Spouse Name: N/A  . Number of  Children: 3  . Years of Education: 12   Occupational History  . Caregiver     Social History Main Topics  . Smoking status: Never Smoker   . Smokeless tobacco: Never Used  . Alcohol Use: No  . Drug Use: No  . Sexual Activity: No   Other Topics Concern  . None   Social History Narrative   Regular exercise-mo   Caffeine Use-no    Outpatient Encounter Prescriptions as of 09/13/2015  Medication Sig  . aspirin (BAYER ASPIRIN) 325 MG tablet Take 325 mg by mouth daily.  . carvedilol (COREG) 3.125 MG tablet Take by mouth.  . Cholecalciferol (VITAMIN D-1000 MAX ST) 1000 units tablet Take by mouth.  . letrozole (FEMARA) 2.5 MG tablet Take 1 tablet (2.5 mg total) by mouth daily.  . rosuvastatin (CRESTOR) 10 MG tablet TAKE 1 TABLET(10 MG) BY MOUTH DAILY  . vitamin E 400 UNIT capsule Take 400 Units by mouth daily.  . [DISCONTINUED] losartan-hydrochlorothiazide (HYZAAR) 100-25 MG per tablet TAKE 1 TABLET BY MOUTH DAILY.  . cefUROXime (CEFTIN) 250 MG tablet Take  1 tablet (250 mg total) by mouth 2 (two) times daily with a meal.   No facility-administered encounter medications on file as of 09/13/2015.    Review of Systems  Constitutional: Negative for fever and appetite change.  HENT: Positive for postnasal drip and sore throat. Negative for sinus pressure.   Respiratory: Positive for cough (productive of thick yellow mucus. ). Negative for chest tightness and shortness of breath.   Cardiovascular: Negative for chest pain, palpitations and leg swelling.  Gastrointestinal: Negative for nausea, vomiting, abdominal pain and diarrhea.  Musculoskeletal: Negative for myalgias and joint swelling.  Skin: Negative for color change and rash.  Neurological: Positive for dizziness. Negative for headaches.  Psychiatric/Behavioral: Negative for dysphoric mood and agitation.       Objective:    Physical Exam  Constitutional: She appears well-developed and well-nourished. No distress.  HENT:  Nose:  Nose normal.  Mouth/Throat: Oropharynx is clear and moist.  Neck: Neck supple.  Cardiovascular: Normal rate and regular rhythm.   Pulmonary/Chest: Breath sounds normal. No respiratory distress. She has no wheezes.  Abdominal: Bowel sounds are normal.  Musculoskeletal: She exhibits no edema or tenderness.  Lymphadenopathy:    She has no cervical adenopathy.  Skin: No rash noted. No erythema.  Psychiatric: She has a normal mood and affect. Her behavior is normal.    BP 138/70 mmHg  Pulse 69  Temp(Src) 98.2 F (36.8 C) (Oral)  Resp 18  Ht '5\' 4"'$  (1.626 m)  Wt 199 lb 12 oz (90.606 kg)  BMI 34.27 kg/m2  SpO2 96% Wt Readings from Last 3 Encounters:  09/13/15 199 lb 12 oz (90.606 kg)  08/21/15 202 lb (91.627 kg)  07/19/15 196 lb 8 oz (89.132 kg)     Lab Results  Component Value Date   WBC 6.0 02/08/2015   HGB 13.5 02/08/2015   HCT 38.4 02/08/2015   PLT 201 02/08/2015   GLUCOSE 123* 07/05/2015   CHOL 300* 05/16/2015   TRIG 309.0* 05/16/2015   HDL 38.00* 05/16/2015   LDLDIRECT 206.0 05/16/2015   LDLCALC 108* 01/01/2015   ALT 19 07/05/2015   AST 14 07/05/2015   NA 140 07/05/2015   K 3.8 07/05/2015   CL 105 07/05/2015   CREATININE 0.90 09/05/2015   BUN 25* 07/05/2015   CO2 27 07/05/2015   TSH 4.34 05/16/2015   INR 0.9 07/13/2012   HGBA1C 6.2 05/16/2015        Assessment & Plan:   Problem List Items Addressed This Visit    Hypertension - Primary    Blood pressure - doing better.  Continue same medication regimen.  Follow.       Light headedness    Describes some light headedness/dizziness as outlined.  Treat current infection as outlined.  She feels related to her neck.  Undergoing w/up.  Desires no further intervention or testing at this time.  Follow.        URI (upper respiratory infection)    Saline nasal spray as directed.  mucinex and robitussin as directed.  rx given for abx if needed with instructions to take only if symptoms worsen/progress.  Follow.          Relevant Medications   cefUROXime (CEFTIN) 250 MG tablet       Einar Pheasant, MD

## 2015-09-14 ENCOUNTER — Other Ambulatory Visit: Payer: Self-pay | Admitting: Internal Medicine

## 2015-09-15 ENCOUNTER — Encounter: Payer: Self-pay | Admitting: Internal Medicine

## 2015-09-15 DIAGNOSIS — J069 Acute upper respiratory infection, unspecified: Secondary | ICD-10-CM | POA: Insufficient documentation

## 2015-09-15 NOTE — Assessment & Plan Note (Signed)
Describes some light headedness/dizziness as outlined.  Treat current infection as outlined.  She feels related to her neck.  Undergoing w/up.  Desires no further intervention or testing at this time.  Follow.

## 2015-09-15 NOTE — Assessment & Plan Note (Signed)
Saline nasal spray as directed.  mucinex and robitussin as directed.  rx given for abx if needed with instructions to take only if symptoms worsen/progress.  Follow.

## 2015-09-15 NOTE — Assessment & Plan Note (Signed)
Blood pressure - doing better.  Continue same medication regimen.  Follow.

## 2015-09-16 DIAGNOSIS — I10 Essential (primary) hypertension: Secondary | ICD-10-CM | POA: Diagnosis not present

## 2015-09-16 DIAGNOSIS — M5023 Other cervical disc displacement, cervicothoracic region: Secondary | ICD-10-CM | POA: Diagnosis not present

## 2015-09-16 DIAGNOSIS — M542 Cervicalgia: Secondary | ICD-10-CM | POA: Diagnosis not present

## 2015-09-18 ENCOUNTER — Other Ambulatory Visit: Payer: Self-pay | Admitting: Neurosurgery

## 2015-09-18 DIAGNOSIS — M5023 Other cervical disc displacement, cervicothoracic region: Secondary | ICD-10-CM

## 2015-09-23 ENCOUNTER — Other Ambulatory Visit: Payer: Self-pay | Admitting: Internal Medicine

## 2015-09-24 ENCOUNTER — Telehealth: Payer: Self-pay | Admitting: *Deleted

## 2015-09-24 NOTE — Telephone Encounter (Signed)
SPoke with the pharmacist at the [facility] to clarify. Thanks

## 2015-09-24 NOTE — Telephone Encounter (Signed)
[facility] requested a medication refill for Crestor for patient.

## 2015-09-26 ENCOUNTER — Ambulatory Visit
Admission: RE | Admit: 2015-09-26 | Discharge: 2015-09-26 | Disposition: A | Discharge status: Home / Self Care | Payer: Medicare Other | Source: Ambulatory Visit | Attending: Neurosurgery | Admitting: Neurosurgery

## 2015-09-26 DIAGNOSIS — M5023 Other cervical disc displacement, cervicothoracic region: Secondary | ICD-10-CM

## 2015-09-26 DIAGNOSIS — M50322 Other cervical disc degeneration at C5-C6 level: Secondary | ICD-10-CM | POA: Diagnosis not present

## 2015-09-26 MED ORDER — DIAZEPAM 5 MG PO TABS
5.0000 mg | ORAL_TABLET | Freq: Once | ORAL | Status: AC
  Administered 2015-09-26: 5 mg via ORAL

## 2015-09-26 MED ORDER — IOPAMIDOL (ISOVUE-M 300) INJECTION 61%
10.0000 mL | Freq: Once | INTRAMUSCULAR | Status: AC | PRN
  Administered 2015-09-26: 10 mL via INTRATHECAL

## 2015-09-26 NOTE — Discharge Instructions (Signed)

## 2015-09-30 DIAGNOSIS — M5023 Other cervical disc displacement, cervicothoracic region: Secondary | ICD-10-CM | POA: Diagnosis not present

## 2015-09-30 DIAGNOSIS — I10 Essential (primary) hypertension: Secondary | ICD-10-CM | POA: Diagnosis not present

## 2015-10-04 ENCOUNTER — Other Ambulatory Visit (INDEPENDENT_AMBULATORY_CARE_PROVIDER_SITE_OTHER): Payer: Medicare Other

## 2015-10-04 DIAGNOSIS — E041 Nontoxic single thyroid nodule: Secondary | ICD-10-CM

## 2015-10-04 DIAGNOSIS — E7800 Pure hypercholesterolemia, unspecified: Secondary | ICD-10-CM | POA: Diagnosis not present

## 2015-10-04 DIAGNOSIS — R739 Hyperglycemia, unspecified: Secondary | ICD-10-CM

## 2015-10-04 DIAGNOSIS — I10 Essential (primary) hypertension: Secondary | ICD-10-CM | POA: Diagnosis not present

## 2015-10-04 LAB — BASIC METABOLIC PANEL
BUN: 25 mg/dL — AB (ref 6–23)
CHLORIDE: 105 meq/L (ref 96–112)
CO2: 28 mEq/L (ref 19–32)
CREATININE: 0.95 mg/dL (ref 0.40–1.20)
Calcium: 9.9 mg/dL (ref 8.4–10.5)
GFR: 61.61 mL/min (ref >60.00)
GLUCOSE: 110 mg/dL — AB (ref 70–99)
POTASSIUM: 4.3 meq/L (ref 3.5–5.1)
Sodium: 140 mEq/L (ref 135–145)

## 2015-10-04 LAB — HEMOGLOBIN A1C: Hgb A1c MFr Bld: 6.6 % — ABNORMAL HIGH (ref 4.6–6.5)

## 2015-10-04 LAB — HEPATIC FUNCTION PANEL
ALK PHOS: 46 U/L (ref 39–117)
ALT: 30 U/L (ref 0–35)
AST: 20 U/L (ref 0–37)
Albumin: 4.3 g/dL (ref 3.5–5.2)
BILIRUBIN DIRECT: 0.1 mg/dL (ref 0.0–0.3)
Total Bilirubin: 0.5 mg/dL (ref 0.2–1.2)
Total Protein: 7.3 g/dL (ref 6.0–8.3)

## 2015-10-04 LAB — LIPID PANEL
CHOL/HDL RATIO: 5
CHOLESTEROL: 182 mg/dL (ref 0–200)
HDL: 39.3 mg/dL (ref >39.00)
NONHDL: 142.38
Triglycerides: 214 mg/dL — ABNORMAL HIGH (ref 0.0–149.0)
VLDL: 42.8 mg/dL — ABNORMAL HIGH (ref 0.0–40.0)

## 2015-10-04 LAB — LDL CHOLESTEROL, DIRECT: Direct LDL: 110 mg/dL

## 2015-10-04 LAB — TSH: TSH: 4.72 u[IU]/mL — ABNORMAL HIGH (ref 0.35–4.50)

## 2015-10-08 ENCOUNTER — Other Ambulatory Visit: Payer: Self-pay | Admitting: Neurosurgery

## 2015-10-09 ENCOUNTER — Encounter: Payer: Self-pay | Admitting: Internal Medicine

## 2015-10-09 ENCOUNTER — Ambulatory Visit (INDEPENDENT_AMBULATORY_CARE_PROVIDER_SITE_OTHER): Payer: Medicare Other | Admitting: Internal Medicine

## 2015-10-09 VITALS — BP 130/70 | HR 76 | Temp 98.6°F | Resp 18 | Ht 64.0 in | Wt 202.0 lb

## 2015-10-09 DIAGNOSIS — I739 Peripheral vascular disease, unspecified: Secondary | ICD-10-CM

## 2015-10-09 DIAGNOSIS — R7989 Other specified abnormal findings of blood chemistry: Secondary | ICD-10-CM

## 2015-10-09 DIAGNOSIS — M542 Cervicalgia: Secondary | ICD-10-CM

## 2015-10-09 DIAGNOSIS — R739 Hyperglycemia, unspecified: Secondary | ICD-10-CM

## 2015-10-09 DIAGNOSIS — I25119 Atherosclerotic heart disease of native coronary artery with unspecified angina pectoris: Secondary | ICD-10-CM | POA: Diagnosis not present

## 2015-10-09 DIAGNOSIS — I10 Essential (primary) hypertension: Secondary | ICD-10-CM

## 2015-10-09 DIAGNOSIS — R945 Abnormal results of liver function studies: Secondary | ICD-10-CM

## 2015-10-09 DIAGNOSIS — R946 Abnormal results of thyroid function studies: Secondary | ICD-10-CM

## 2015-10-09 DIAGNOSIS — C50912 Malignant neoplasm of unspecified site of left female breast: Secondary | ICD-10-CM

## 2015-10-09 DIAGNOSIS — E7800 Pure hypercholesterolemia, unspecified: Secondary | ICD-10-CM

## 2015-10-09 NOTE — Patient Instructions (Signed)
appt with Dr Nehemiah Massed Friday 10/11/15 - 11:00

## 2015-10-09 NOTE — Progress Notes (Signed)
Pre-visit discussion using our clinic review tool. No additional management support is needed unless otherwise documented below in the visit note.  

## 2015-10-09 NOTE — Progress Notes (Signed)
[name], female   DOB: 05/30/1945, 70 y.o.   MRN: [id number]   Subjective:    [name], female    DOB: 04/02/1946, 70 y.o.   MRN: [id number]  HPI  Patient here for a scheduled follow up.  She reports that she is due to have neck surgery next week.  Seeing Dr Botero.  States that she does feel better.  States since being placed on coreg, feels she is doing better.  No chest pain.  Feels she is breathing better.  States blood pressure has been averaging 130s/70s.  No acid reflux.  No abdominal pain or cramping.  Bowels stable.  Discussed labs.  She is tolerating crestor.  Cholesterol is better.     Past Medical History  Diagnosis Date  . Depression   . History of chicken pox   . Glaucoma   . GERD (gastroesophageal reflux disease)   . Hyperlipidemia   . Hypertension   . Peripheral vascular disease (HCC)   . Diffuse cystic mastopathy 2013  . High cholesterol   . Pneumonia March 2014  . Cancer (HCC)     Breast cancer - TI, NO, [place] - IDC, ER/PR pos, Her 2 neg   Past Surgical History  Procedure Laterality Date  . Cholecystectomy  2006  . Leg stent  2011  . Back surgery  1986    ruptured disc  . Dilation and curettage of uterus    . Colonoscopy  2010    Dr. Sankar  . Mole removal  2013    15 removed  . Breast biopsy  2008  . Breast biopsy Left 08-20-14  . Breast surgery Left 09-05-14    left breast lumpectomy with SLN biopsy, ER/PR positive. HER2 negative.   Family History  Problem Relation Age of Onset  . Cancer Father     Prostate  . Hyperlipidemia Other     Parent  . Miscarriages / Stillbirths Other     Parent  . Hypertension Other     parent  . Heart disease Other     Parent  . Breast cancer Maternal Aunt    Social History   Social History  . Marital Status: Married    Spouse Name: N/A  . Number of Children: 3  . Years of Education: 12   Occupational History  . Caregiver     Social History Main Topics  . Smoking status:  Never Smoker   . Smokeless tobacco: Never Used  . Alcohol Use: No  . Drug Use: No  . Sexual Activity: No   Other Topics Concern  . None   Social History Narrative   Regular exercise-mo   Caffeine Use-no    Outpatient Encounter Prescriptions as of 10/09/2015  Medication Sig  . aspirin (BAYER ASPIRIN) 325 MG tablet Take 325 mg by mouth daily.  . carvedilol (COREG) 3.125 MG tablet Take 3.125 mg by mouth 2 (two) times daily with a meal.   . Cholecalciferol (VITAMIN D-1000 MAX ST) 1000 units tablet Take 1,000 Units by mouth daily.   . letrozole (FEMARA) 2.5 MG tablet Take 1 tablet (2.5 mg total) by mouth daily.  . losartan-hydrochlorothiazide (HYZAAR) 100-25 MG tablet TAKE 1 TABLET BY MOUTH EVERY DAY  . rosuvastatin (CRESTOR) 10 MG tablet TAKE 1 TABLET(10 MG) BY MOUTH DAILY  . vitamin E 400 UNIT capsule Take 400 Units by mouth daily.  . [DISCONTINUED] cefUROXime (CEFTIN) 250 MG tablet Take 1 tablet (250 mg total) by   mouth 2 (two) times daily with a meal.   No facility-administered encounter medications on file as of 10/09/2015.    Review of Systems  Constitutional: Negative for appetite change and unexpected weight change.  HENT: Negative for congestion and sinus pressure.   Respiratory: Negative for cough, chest tightness and shortness of breath.   Cardiovascular: Negative for chest pain, palpitations and leg swelling.  Gastrointestinal: Negative for nausea, vomiting, abdominal pain and diarrhea.  Genitourinary: Negative for dysuria and difficulty urinating.  Musculoskeletal: Negative for joint swelling.       Some neck, shoulder pain.    Skin: Negative for color change and rash.  Neurological: Negative for dizziness, light-headedness and headaches.  Psychiatric/Behavioral: Negative for dysphoric mood and agitation.       Objective:     Blood pressure rechecked by me:  148-150/78  Physical Exam  Constitutional: She appears well-developed and well-nourished. No distress.    HENT:  Nose: Nose normal.  Mouth/Throat: Oropharynx is clear and moist.  Neck: Neck supple. No thyromegaly present.  Cardiovascular: Normal rate and regular rhythm.   Pulmonary/Chest: Breath sounds normal. No respiratory distress. She has no wheezes.  Abdominal: Soft. Bowel sounds are normal. There is no tenderness.  Musculoskeletal: She exhibits no edema or tenderness.  Lymphadenopathy:    She has no cervical adenopathy.  Skin: No rash noted. No erythema.  Psychiatric: She has a normal mood and affect. Her behavior is normal.    BP 130/70 mmHg  Pulse 76  Temp(Src) 98.6 F (37 C) (Oral)  Resp 18  Ht 5' 4" (1.626 m)  Wt 202 lb (91.627 kg)  BMI 34.66 kg/m2  SpO2 95% Wt Readings from Last 3 Encounters:  10/09/15 202 lb (91.627 kg)  09/13/15 199 lb 12 oz (90.606 kg)  08/21/15 202 lb (91.627 kg)     Lab Results  Component Value Date   WBC 6.0 02/08/2015   HGB 13.5 02/08/2015   HCT 38.4 02/08/2015   PLT 201 02/08/2015   GLUCOSE 110* 10/04/2015   CHOL 182 10/04/2015   TRIG 214.0* 10/04/2015   HDL 39.30 10/04/2015   LDLDIRECT 110.0 10/04/2015   LDLCALC 108* 01/01/2015   ALT 30 10/04/2015   AST 20 10/04/2015   NA 140 10/04/2015   K 4.3 10/04/2015   CL 105 10/04/2015   CREATININE 0.95 10/04/2015   BUN 25* 10/04/2015   CO2 28 10/04/2015   TSH 4.72* 10/04/2015   INR 0.9 07/13/2012   HGBA1C 6.6* 10/04/2015    Ct Cervical Spine W Contrast  09/26/2015  CLINICAL DATA:  Neck pain. LEFT shoulder pain. Decreased range of motion FLUOROSCOPY TIME:  43 seconds corresponding to a Dose Area Product of 125 Gy*m2 PROCEDURE: LUMBAR PUNCTURE FOR CERVICAL MYELOGRAM After thorough discussion of risks and benefits of the procedure including bleeding, infection, injury to nerves, blood vessels, adjacent structures as well as headache and CSF leak, written and oral informed consent was obtained. Consent was obtained by Dr. Rolla Flatten. We discussed the high likelihood of obtaining a  diagnostic study. Patient was positioned prone on the fluoroscopy table. Local anesthesia was provided with 1% lidocaine without epinephrine after prepped and draped in the usual sterile fashion. Puncture was performed at L3-L4 using a 3 1/2 inch 22-gauge spinal needle via RIGHT paramedian approach. Using a single pass through the dura, the needle was placed within the thecal sac, with return of clear CSF. 10 mL of Isovue-M 300 was injected into the thecal sac, with normal opacification of the  nerve roots and cauda equina consistent with free flow within the subarachnoid space. The patient was then moved to the trendelenburg position and contrast flowed into the Cervical spine region. I personally performed the lumbar puncture and administered the intrathecal contrast. I also personally supervised acquisition of the myelogram images. TECHNIQUE: Contiguous axial images were obtained through the Cervical spine after the intrathecal infusion of infusion. Coronal and sagittal reconstructions were obtained of the axial image sets. FINDINGS: CERVICAL MYELOGRAM FINDINGS: Good opacification cervical subarachnoid space. Severe disc space narrowing at C5-6 and C6-7, with effacement of the exiting C6 and C7 nerve roots, LEFT greater than RIGHT, at those levels respectively. There is also a large asymmetric extradural defect at C4-5 on the LEFT related to posterior element hypertrophy. Upright Lateral flexion extension views demonstrate 1 mm anterolisthesis at C3-4 and 2 mm anterolisthesis C4-5 which are facet mediated, slightly worse in flexion, reducing to anatomic alignment and extension. No significant movement at C5-6 or C6-7. CT CERVICAL MYELOGRAM FINDINGS: Alignment: Trace anterolisthesis C3-4 and C4-5 facet mediated. Vertebrae: Osseous spurring, but no destructive lesion. Cord: Multilevel stenosis with displacement and flattening, described below. Posterior Fossa: No tonsillar herniation. Vertebral Arteries: Not  assessed. Paraspinal tissues: Unremarkable. Lung apices clear. Atherosclerosis. Disc levels: The individual disc spaces were examined as follows: C2-3: BILATERAL facet arthropathy. Spontaneous Posterior facet arthrodesis on the LEFT. Slight osseous spurring. Slight LEFT foraminal narrowing does not clearly affect the C3 nerve root. C3-4: Trace anterolisthesis is facet mediated. Severe BILATERAL facet arthropathy. Uncinate spurring worse on the LEFT. LEFT greater than RIGHT C4 nerve root impingement. C4-5: Severe BILATERAL facet arthropathy with ossified facet spur eccentric to the LEFT, identified on prior neck CT. This results in a 9 x 10 mm extradural mass displacing the cord from LEFT to RIGHT. Severe LEFT C5 nerve root impingement. C5-6: Advanced facet arthropathy. Medial facet ossification also extends into the epidural space on the LEFT, not as severe as the level above. Disc space narrowing with uncinate spurring and disc osteophyte complex. LEFT greater than RIGHT C6 nerve root impingement. Mild cord flattening. Canal diameter 7-8 mm. C6-7: Severe disc space narrowing. Disc osteophyte complex extends more to the LEFT. LEFT greater than RIGHT C7 nerve root impingement. Mild cord flattening. Canal diameter C7-T1:  Unremarkable. IMPRESSION: Multilevel spondylosis as described, with severe LEFT-sided neural impingement at C4-5 and C5-6 due to medially projecting and ossific facet arthropathy and superimposed disc pathology. Disc space narrowing with osseous spurring results in LEFT greater than RIGHT foraminal narrowing at C6-7. Slight facet mediated anterolisthesis at C3-4 and C4-5 does not demonstrate significant dynamic instability. Electronically Signed   By: John T Curnes M.D.   On: 09/26/2015 12:45   Dg Myelography Lumbar Inj Cervical  09/26/2015  CLINICAL DATA:  Neck pain. LEFT shoulder pain. Decreased range of motion FLUOROSCOPY TIME:  43 seconds corresponding to a Dose Area Product of 125 Gy*m2  PROCEDURE: LUMBAR PUNCTURE FOR CERVICAL MYELOGRAM After thorough discussion of risks and benefits of the procedure including bleeding, infection, injury to nerves, blood vessels, adjacent structures as well as headache and CSF leak, written and oral informed consent was obtained. Consent was obtained by Dr. John Curnes. We discussed the high likelihood of obtaining a diagnostic study. Patient was positioned prone on the fluoroscopy table. Local anesthesia was provided with 1% lidocaine without epinephrine after prepped and draped in the usual sterile fashion. Puncture was performed at L3-L4 using a 3 1/2 inch 22-gauge spinal needle via RIGHT paramedian approach. Using a single   pass through the dura, the needle was placed within the thecal sac, with return of clear CSF. 10 mL of Isovue-M 300 was injected into the thecal sac, with normal opacification of the nerve roots and cauda equina consistent with free flow within the subarachnoid space. The patient was then moved to the trendelenburg position and contrast flowed into the Cervical spine region. I personally performed the lumbar puncture and administered the intrathecal contrast. I also personally supervised acquisition of the myelogram images. TECHNIQUE: Contiguous axial images were obtained through the Cervical spine after the intrathecal infusion of infusion. Coronal and sagittal reconstructions were obtained of the axial image sets. FINDINGS: CERVICAL MYELOGRAM FINDINGS: Good opacification cervical subarachnoid space. Severe disc space narrowing at C5-6 and C6-7, with effacement of the exiting C6 and C7 nerve roots, LEFT greater than RIGHT, at those levels respectively. There is also a large asymmetric extradural defect at C4-5 on the LEFT related to posterior element hypertrophy. Upright Lateral flexion extension views demonstrate 1 mm anterolisthesis at C3-4 and 2 mm anterolisthesis C4-5 which are facet mediated, slightly worse in flexion, reducing to  anatomic alignment and extension. No significant movement at C5-6 or C6-7. CT CERVICAL MYELOGRAM FINDINGS: Alignment: Trace anterolisthesis C3-4 and C4-5 facet mediated. Vertebrae: Osseous spurring, but no destructive lesion. Cord: Multilevel stenosis with displacement and flattening, described below. Posterior Fossa: No tonsillar herniation. Vertebral Arteries: Not assessed. Paraspinal tissues: Unremarkable. Lung apices clear. Atherosclerosis. Disc levels: The individual disc spaces were examined as follows: C2-3: BILATERAL facet arthropathy. Spontaneous Posterior facet arthrodesis on the LEFT. Slight osseous spurring. Slight LEFT foraminal narrowing does not clearly affect the C3 nerve root. C3-4: Trace anterolisthesis is facet mediated. Severe BILATERAL facet arthropathy. Uncinate spurring worse on the LEFT. LEFT greater than RIGHT C4 nerve root impingement. C4-5: Severe BILATERAL facet arthropathy with ossified facet spur eccentric to the LEFT, identified on prior neck CT. This results in a 9 x 10 mm extradural mass displacing the cord from LEFT to RIGHT. Severe LEFT C5 nerve root impingement. C5-6: Advanced facet arthropathy. Medial facet ossification also extends into the epidural space on the LEFT, not as severe as the level above. Disc space narrowing with uncinate spurring and disc osteophyte complex. LEFT greater than RIGHT C6 nerve root impingement. Mild cord flattening. Canal diameter 7-8 mm. C6-7: Severe disc space narrowing. Disc osteophyte complex extends more to the LEFT. LEFT greater than RIGHT C7 nerve root impingement. Mild cord flattening. Canal diameter C7-T1:  Unremarkable. IMPRESSION: Multilevel spondylosis as described, with severe LEFT-sided neural impingement at C4-5 and C5-6 due to medially projecting and ossific facet arthropathy and superimposed disc pathology. Disc space narrowing with osseous spurring results in LEFT greater than RIGHT foraminal narrowing at C6-7. Slight facet mediated  anterolisthesis at C3-4 and C4-5 does not demonstrate significant dynamic instability. Electronically Signed   By: Staci Righter M.D.   On: 09/26/2015 12:45       Assessment & Plan:   Problem List Items Addressed This Visit    Abnormal liver function test    Recent liver panel wnl.       Breast cancer ([place])    On femara.  Sees Dr Jamal Collin.       CAD (coronary artery disease)    Has known CAD.  Currently doing better.  Breathing better on coreg.  Sees Dr Nehemiah Massed.  Given upcoming surgery, will have cardiology to evaluate.        Hypercholesterolemia    On crestor.  Tolerating.  Cholesterol improved.  Hold on making changes.  Low cholesterol diet and exercise.  Follow.        Hyperglycemia    Low carb diet and exercise.  Follow met b and a1c.  Recent a1c 6.0.        Hypertension - Primary    Blood pressure on recheck elevated.  States her outside checks are averaging 130s/70s.  Will need close intra op and post op monitoring of heart rate and blood pressure to avoid extremes.        Neck pain    Neck pain with left shoulder pain.  Myelogram as outlined.  Seeing Dr Botero.  Planning for surgery next week.  Refer to cardiology for pre op evaluation.        Peripheral vascular disease (HCC)    Followed by Dr Dew.  S/p stent placement.  Continue risk factor modification.         Other Visit Diagnoses    Elevated TSH        Relevant Orders    TSH        SCOTT, CHARLENE, MD  

## 2015-10-10 ENCOUNTER — Encounter (HOSPITAL_COMMUNITY)
Admission: RE | Admit: 2015-10-10 | Discharge: 2015-10-10 | Disposition: A | Discharge status: Home / Self Care | Payer: Medicare Other | Source: Ambulatory Visit | Attending: Neurosurgery | Admitting: Neurosurgery

## 2015-10-10 ENCOUNTER — Encounter: Payer: Self-pay | Admitting: Internal Medicine

## 2015-10-10 ENCOUNTER — Encounter (HOSPITAL_COMMUNITY): Payer: Self-pay

## 2015-10-10 DIAGNOSIS — Z01812 Encounter for preprocedural laboratory examination: Secondary | ICD-10-CM | POA: Diagnosis not present

## 2015-10-10 DIAGNOSIS — M5023 Other cervical disc displacement, cervicothoracic region: Secondary | ICD-10-CM | POA: Insufficient documentation

## 2015-10-10 DIAGNOSIS — I739 Peripheral vascular disease, unspecified: Secondary | ICD-10-CM | POA: Insufficient documentation

## 2015-10-10 DIAGNOSIS — Z79899 Other long term (current) drug therapy: Secondary | ICD-10-CM | POA: Insufficient documentation

## 2015-10-10 DIAGNOSIS — Z01818 Encounter for other preprocedural examination: Secondary | ICD-10-CM | POA: Diagnosis not present

## 2015-10-10 DIAGNOSIS — E785 Hyperlipidemia, unspecified: Secondary | ICD-10-CM | POA: Diagnosis not present

## 2015-10-10 DIAGNOSIS — Z853 Personal history of malignant neoplasm of breast: Secondary | ICD-10-CM | POA: Insufficient documentation

## 2015-10-10 DIAGNOSIS — Z79811 Long term (current) use of aromatase inhibitors: Secondary | ICD-10-CM | POA: Diagnosis not present

## 2015-10-10 DIAGNOSIS — G4733 Obstructive sleep apnea (adult) (pediatric): Secondary | ICD-10-CM | POA: Insufficient documentation

## 2015-10-10 DIAGNOSIS — Z7982 Long term (current) use of aspirin: Secondary | ICD-10-CM | POA: Insufficient documentation

## 2015-10-10 DIAGNOSIS — M542 Cervicalgia: Secondary | ICD-10-CM | POA: Insufficient documentation

## 2015-10-10 DIAGNOSIS — K219 Gastro-esophageal reflux disease without esophagitis: Secondary | ICD-10-CM | POA: Diagnosis not present

## 2015-10-10 DIAGNOSIS — I10 Essential (primary) hypertension: Secondary | ICD-10-CM | POA: Diagnosis not present

## 2015-10-10 HISTORY — DX: Family history of other specified conditions: Z84.89

## 2015-10-10 HISTORY — DX: Atherosclerotic heart disease of native coronary artery without angina pectoris: I25.10

## 2015-10-10 HISTORY — DX: Unspecified osteoarthritis, unspecified site: M19.90

## 2015-10-10 HISTORY — DX: Presence of spectacles and contact lenses: Z97.3

## 2015-10-10 HISTORY — DX: Other specified abnormal findings of blood chemistry: R79.89

## 2015-10-10 HISTORY — DX: Other specified noninflammatory disorders of cervix uteri: N88.8

## 2015-10-10 HISTORY — DX: Hyperglycemia, unspecified: R73.9

## 2015-10-10 HISTORY — DX: Sleep apnea, unspecified: G47.30

## 2015-10-10 LAB — CBC
HEMATOCRIT: 38.2 % (ref 36.0–46.0)
Hemoglobin: 12.9 g/dL (ref 12.0–15.0)
MCH: 30.8 pg (ref 26.0–34.0)
MCHC: 33.8 g/dL (ref 30.0–36.0)
MCV: 91.2 fL (ref 78.0–100.0)
Platelets: 208 10*3/uL (ref 150–400)
RBC: 4.19 MIL/uL (ref 3.87–5.11)
RDW: 12.7 % (ref 11.5–15.5)
WBC: 6.3 10*3/uL (ref 4.0–10.5)

## 2015-10-10 LAB — SURGICAL PCR SCREEN
MRSA, PCR: NEGATIVE
Staphylococcus aureus: NEGATIVE

## 2015-10-10 NOTE — Assessment & Plan Note (Signed)
Recent liver panel wnl.  

## 2015-10-10 NOTE — Assessment & Plan Note (Signed)
On femara.  Sees Dr Jamal Collin.

## 2015-10-10 NOTE — Pre-Procedure Instructions (Signed)
Lindsey Bentley  10/10/2015      [facility] DRUG STORE [address], [place] - [place] AT [address] [place] [address] Phone: [phone] Fax: [phone]    Your procedure is scheduled on Tuesday, October 15, 2015  Report to [facility] Admitting at 6:00 A.M. ( per MD)  Call this number if you have problems the morning of surgery:  [phone]   Remember:  Do not eat food or drink liquids after midnight Monday, October 14, 2015  Take these medicines the morning of surgery with A SIP OF WATER : carvedilol (COREG),  letrozole [facility]) Stop taking Aspirin, all vitamins, fish oil and herbal medications. Do not take any NSAIDs ie: Ibuprofen, Advil, Naproxen, BC and Goody Powder or any medication containing Aspirin; stop now.  Do not wear jewelry, make-up or nail polish.  Do not wear lotions, powders, or perfumes.  You may not wear deodorant.  Do not shave 48 hours prior to surgery.   Do not bring valuables to the hospital.  [facility] is not responsible for any belongings or valuables.  Contacts, dentures or bridgework may not be worn into surgery.  Leave your suitcase in the car.  After surgery it may be brought to your room.  For patients admitted to the hospital, discharge time will be determined by your treatment team.  Patients discharged the day of surgery will not be allowed to drive home.   Name and phone number of your driver:   Special instructions:  [facility] - Preparing for Surgery  Before surgery, you can play an important role.  Because skin is not sterile, your skin needs to be as free of germs as possible.  You can reduce the number of germs on you skin by washing with CHG (chlorahexidine gluconate) soap before surgery.  CHG is an antiseptic cleaner which kills germs and bonds with the skin to continue killing germs even after washing.  Please DO NOT use if you have an allergy to CHG or antibacterial  soaps.  If your skin becomes reddened/irritated stop using the CHG and inform your nurse when you arrive at Short Stay.  Do not shave (including legs and underarms) for at least 48 hours prior to the first CHG shower.  You may shave your face.  Please follow these instructions carefully:   1.  Shower with CHG Soap the night before surgery and the morning of Surgery.  2.  If you choose to wash your hair, wash your hair first as usual with your normal shampoo.  3.  After you shampoo, rinse your hair and body thoroughly to remove the Shampoo.  4.  Use CHG as you would any other liquid soap.  You can apply chg directly  to the skin and wash gently with scrungie or a clean washcloth.  5.  Apply the CHG Soap to your body ONLY FROM THE NECK DOWN.  Do not use on open wounds or open sores.  Avoid contact with your eyes, ears, mouth and genitals (private parts).  Wash genitals (private parts) with your normal soap.  6.  Wash thoroughly, paying special attention to the area where your surgery will be performed.  7.  Thoroughly rinse your body with warm water from the neck down.  8.  DO NOT shower/wash with your normal soap after using and rinsing off the CHG Soap.  9.  Pat yourself dry with a  clean towel.            10.  Wear clean pajamas.            11.  Place clean sheets on your bed the night of your first shower and do not sleep with pets.  Day of Surgery  Do not apply any lotions/deodorants the morning of surgery.  Please wear clean clothes to the hospital/surgery center.  Please read over the following fact sheets that you were given. Pain Booklet, Coughing and Deep Breathing, MRSA Information and Surgical Site Infection Prevention

## 2015-10-10 NOTE — Assessment & Plan Note (Signed)
Followed by Dr Lucky Cowboy.  S/p stent placement.  Continue risk factor modification.

## 2015-10-10 NOTE — Assessment & Plan Note (Signed)
On crestor.  Tolerating.  Cholesterol improved.  Hold on making changes.  Low cholesterol diet and exercise.  Follow.

## 2015-10-10 NOTE — Assessment & Plan Note (Signed)
Blood pressure on recheck elevated.  States her outside checks are averaging 130s/70s.  Will need close intra op and post op monitoring of heart rate and blood pressure to avoid extremes.

## 2015-10-10 NOTE — Assessment & Plan Note (Signed)
Low carb diet and exercise.  Follow met b and a1c.  Recent a1c 6.0.   

## 2015-10-10 NOTE — Assessment & Plan Note (Signed)
Has known CAD.  Currently doing better.  Breathing better on coreg.  Sees Dr Nehemiah Massed.  Given upcoming surgery, will have cardiology to evaluate.

## 2015-10-10 NOTE — Assessment & Plan Note (Signed)
Neck pain with left shoulder pain.  Myelogram as outlined.  Seeing Dr Joya Salm.  Planning for surgery next week.  Refer to cardiology for pre op evaluation.

## 2015-10-11 DIAGNOSIS — E782 Mixed hyperlipidemia: Secondary | ICD-10-CM | POA: Diagnosis not present

## 2015-10-11 DIAGNOSIS — I119 Hypertensive heart disease without heart failure: Secondary | ICD-10-CM | POA: Diagnosis not present

## 2015-10-11 DIAGNOSIS — I10 Essential (primary) hypertension: Secondary | ICD-10-CM | POA: Diagnosis not present

## 2015-10-11 NOTE — Progress Notes (Signed)
Pt denies SOB and chest pain but is under the care of Dr. Nehemiah Massed, Cardiology of [facility]. Pt stated that she is scheduled to see her cardiologist today for cardiac clearance. Pt chart forwarded to anesthesia for review. Requested EKG tracing, LOV note and clearance note from Dr. Nehemiah Massed.

## 2015-10-14 MED ORDER — CEFAZOLIN SODIUM-DEXTROSE 2-4 GM/100ML-% IV SOLN
2.0000 g | INTRAVENOUS | Status: AC
  Administered 2015-10-15: 2 g via INTRAVENOUS
  Filled 2015-10-14: qty 100

## 2015-10-14 NOTE — H&P (Signed)
Lindsey Bentley is an 70 y.o. female.   Chief Complaint: neck pain HPI: patient complaining of neck pain with movement,pain sensation in the face and base of the shoulder.  Past Medical History  Diagnosis Date  . Depression   . History of chicken pox   . Glaucoma   . GERD (gastroesophageal reflux disease)   . Hyperlipidemia   . Hypertension   . Peripheral vascular disease ([place])   . Diffuse cystic mastopathy 2013  . High cholesterol   . Pneumonia March 2014  . Cancer (HCC)     Breast cancer - TI, NO, MO - IDC, ER/PR pos, Her 2 neg  . Wears glasses   . Sleep apnea     wears CPAP set at 2.5  . Family history of adverse reaction to anesthesia     Pt stated that son had a seizure with a combination of anesthesia and pain medication."  . Arthritis   . 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 Coronary artery disease   . Hyperglycemia   . Elevated TSH     Past Surgical History  Procedure Laterality Date  . Cholecystectomy  2006  . Leg stent  2011  . Back surgery  1986    ruptured disc  . Dilation and curettage of uterus    . Colonoscopy  2010    Dr. Jamal Collin  . Mole removal  2013    15 removed  . Breast biopsy  2008  . Breast biopsy Left 08-20-14  . Breast surgery Left 09-05-14    left breast lumpectomy with SLN biopsy, ER/PR positive. HER2 negative.    Family History  Problem Relation Age of Onset  . Cancer Father     Prostate  . Hyperlipidemia Other     Parent  . Miscarriages / Stillbirths Other     Parent  . Hypertension Other     parent  . Heart disease Other     Parent  . Breast cancer Maternal Aunt    Social History:  reports that she has never smoked. She has never used smokeless tobacco. She reports that she does not drink alcohol or use illicit drugs.  Allergies:  Allergies  Allergen Reactions  . Codeine Nausea And Vomiting  . Adhesive [Tape] Itching and Rash    Please use "paper" tape  . Prednisone Swelling  . Statins  Other (See Comments)    Muscle Pain, can take Crestor    No prescriptions prior to admission    No results found for this or any previous visit (from the past 5 hour(s)). No results found.  Review of Systems  Constitutional: Negative.   Eyes: Negative.   Respiratory: Negative.   Cardiovascular: Positive for leg swelling.  Gastrointestinal: Negative.   Genitourinary: Negative.   Musculoskeletal: Positive for back pain and neck pain.  Skin: Negative.   Neurological: Negative.   Endo/Heme/Allergies: Negative.   Psychiatric/Behavioral: Negative.     There were no vitals taken for this visit. Physical Exam hent,nl. Neck pain with mobility. Cv, nl. Lungs, clear. Abdomen, nl. Extremiies,nl. NEURO except for some mild weakness of the deltoids her neuro examination is normal. Mri, ct shows ddd but at c4-5  ,left she has a large calciffied ligament displacing the cord Assessment/Plan Left c4-5 hemilaminectomies to remove mass. She and her daughter are aware of risks and limitations such as paralysis, csf leak, unable to remove the mass.amonfg the possible complications  Floyce Stakes, MD 10/14/2015, 5:33  PM

## 2015-10-14 NOTE — Progress Notes (Addendum)
Anesthesia Chart Review: Patient is a 70 year old female scheduled for left C4-5 hemilaminectomy/remove mass on 10/15/15 by Dr. Joya Salm. 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  History includes non-smoker, CAD (not specified; cardiology notes do not mention CAD, only HTN with LVH), GERD, HTN, HLD, breast cancer s/p left lumpectomy with SLN biopsy 09/05/14, diffuse cystic mastopathy '13, OSA with CPAP use, hyperglycemia, elevated TSH (monitored by PCP), PAD s/p leg stent '11 (not specified) '11 (Dr. Hulda Humphrey), cholecystectomy, back surgery. BMI is consistent with obesity.  PCP is Dr. Einar Pheasant with [facility]. She referred patient for pre-operative cardiology evaluation prior to surgery. Cardiologist is Dr. Serafina Royals with [facility] Everywhere). Following recent visit, he felt she was okay to proceed with surgery. Oncologist is Dr. Jamal Collin. Vascular surgeon is Dr. Lucky Cowboy.  Meds include ASA, Coreg, Femara, Hyzaar, Crestor.  05/22/15 Nuclear stress test [facility], Care Everywhere): FINDINGS: Regional wall motion: reveals normal myocardial thickening and wall  Motion. LVEF 73%. The overall quality of the study is good.  Left ventricular cavity: normal. Perfusion Analysis: SPECT images demonstrate homogeneous tracer  distribution throughout the myocardium. Normal myocardial perfusion without evidence of myocardial ischemia.  05/22/15 Echo ([facility], Care Everywhere): INTERPRETATION NORMAL LEFT VENTRICULAR SYSTOLIC FUNCTION WITH MODERATE LVH MILD VALVULAR REGURGITATION: Mild MR, Mild TR NO VALVULAR STENOSIS MILD PHTN. Peak RV pressure 34.9 mmHg. EF >55%  05/08/15 EKG ([place]): NSR.  10/05/13 Carotid U/S (scanned under Media tab; [facility], Melvyn Neth, PAC/Dr. Lucky Cowboy): "1-49% bilateral ICA stenosis and elevated common carotid artery velocities (left>right). When compared to prior study 03/2013, there has been no significant change."  09/26/15  CT C-spine:  IMPRESSION: - Multilevel spondylosis as described, with severe LEFT-sided neural impingement at C4-5 and C5-6 due to medially projecting and ossific facet arthropathy and superimposed disc pathology. - Disc space narrowing with osseous spurring results in LEFT greater than RIGHT foraminal narrowing at C6-7. - Slight facet mediated anterolisthesis at C3-4 and C4-5 does not demonstrate significant dynamic instability.  Labs from 10/04/15 and 10/10/15 noted. A1c 6.6. TSH 4.72 (previosly 4.34-5.15 since 02/2015).   If no acute changes then I would anticipate that she could proceed as planned.  [facility] Short Stay Center/Anesthesiology Phone [phone] 10/14/2015 10:46 AM

## 2015-10-15 ENCOUNTER — Encounter (HOSPITAL_COMMUNITY)
Admission: AD | Disposition: A | Discharge status: Home / Self Care | Payer: Self-pay | Source: Ambulatory Visit | Attending: Neurosurgery

## 2015-10-15 ENCOUNTER — Ambulatory Visit (HOSPITAL_COMMUNITY): Payer: Medicare Other | Admitting: Certified Registered"

## 2015-10-15 ENCOUNTER — Ambulatory Visit (HOSPITAL_COMMUNITY): Payer: Medicare Other

## 2015-10-15 ENCOUNTER — Encounter (HOSPITAL_COMMUNITY): Payer: Self-pay | Admitting: Certified Registered"

## 2015-10-15 ENCOUNTER — Ambulatory Visit (HOSPITAL_COMMUNITY): Payer: Medicare Other | Admitting: Vascular Surgery

## 2015-10-15 ENCOUNTER — Observation Stay (HOSPITAL_COMMUNITY)
Admission: AD | Admit: 2015-10-15 | Discharge: 2015-10-21 | Disposition: A | Discharge status: Home / Self Care | Payer: Medicare Other | Source: Ambulatory Visit | Attending: Neurosurgery | Admitting: Neurosurgery

## 2015-10-15 DIAGNOSIS — I2510 Atherosclerotic heart disease of native coronary artery without angina pectoris: Secondary | ICD-10-CM | POA: Diagnosis not present

## 2015-10-15 DIAGNOSIS — M1990 Unspecified osteoarthritis, unspecified site: Secondary | ICD-10-CM | POA: Diagnosis not present

## 2015-10-15 DIAGNOSIS — Z9889 Other specified postprocedural states: Secondary | ICD-10-CM

## 2015-10-15 DIAGNOSIS — E785 Hyperlipidemia, unspecified: Secondary | ICD-10-CM | POA: Insufficient documentation

## 2015-10-15 DIAGNOSIS — M7918 Myalgia, other site: Secondary | ICD-10-CM

## 2015-10-15 DIAGNOSIS — Z419 Encounter for procedure for purposes other than remedying health state, unspecified: Secondary | ICD-10-CM

## 2015-10-15 DIAGNOSIS — I739 Peripheral vascular disease, unspecified: Secondary | ICD-10-CM | POA: Diagnosis not present

## 2015-10-15 DIAGNOSIS — M50121 Cervical disc disorder at C4-C5 level with radiculopathy: Principal | ICD-10-CM | POA: Insufficient documentation

## 2015-10-15 DIAGNOSIS — G4733 Obstructive sleep apnea (adult) (pediatric): Secondary | ICD-10-CM | POA: Insufficient documentation

## 2015-10-15 DIAGNOSIS — M6748 Ganglion, other site: Secondary | ICD-10-CM | POA: Insufficient documentation

## 2015-10-15 DIAGNOSIS — F329 Major depressive disorder, single episode, unspecified: Secondary | ICD-10-CM | POA: Diagnosis not present

## 2015-10-15 DIAGNOSIS — M5020 Other cervical disc displacement, unspecified cervical region: Secondary | ICD-10-CM | POA: Diagnosis not present

## 2015-10-15 DIAGNOSIS — G959 Disease of spinal cord, unspecified: Secondary | ICD-10-CM | POA: Diagnosis not present

## 2015-10-15 DIAGNOSIS — I10 Essential (primary) hypertension: Secondary | ICD-10-CM | POA: Diagnosis not present

## 2015-10-15 DIAGNOSIS — D492 Neoplasm of unspecified behavior of bone, soft tissue, and skin: Secondary | ICD-10-CM | POA: Diagnosis present

## 2015-10-15 DIAGNOSIS — K219 Gastro-esophageal reflux disease without esophagitis: Secondary | ICD-10-CM | POA: Diagnosis not present

## 2015-10-15 DIAGNOSIS — R937 Abnormal findings on diagnostic imaging of other parts of musculoskeletal system: Secondary | ICD-10-CM | POA: Diagnosis not present

## 2015-10-15 HISTORY — PX: POSTERIOR CERVICAL LAMINECTOMY: SHX2248

## 2015-10-15 SURGERY — POSTERIOR CERVICAL LAMINECTOMY
Anesthesia: General | Laterality: Left

## 2015-10-15 MED ORDER — VECURONIUM BROMIDE 10 MG IV SOLR
INTRAVENOUS | Status: DC | PRN
  Administered 2015-10-15: 2 mg via INTRAVENOUS

## 2015-10-15 MED ORDER — LETROZOLE 2.5 MG PO TABS
2.5000 mg | ORAL_TABLET | Freq: Every day | ORAL | Status: DC
  Administered 2015-10-16 – 2015-10-21 (×6): 2.5 mg via ORAL
  Filled 2015-10-15 (×6): qty 1

## 2015-10-15 MED ORDER — PROMETHAZINE HCL 25 MG/ML IJ SOLN
12.5000 mg | Freq: Four times a day (QID) | INTRAMUSCULAR | Status: DC | PRN
  Administered 2015-10-15: 12.5 mg via INTRAVENOUS
  Filled 2015-10-15: qty 1

## 2015-10-15 MED ORDER — ACETAMINOPHEN 325 MG PO TABS
650.0000 mg | ORAL_TABLET | ORAL | Status: DC | PRN
  Administered 2015-10-19 – 2015-10-21 (×3): 650 mg via ORAL
  Filled 2015-10-15 (×3): qty 2

## 2015-10-15 MED ORDER — DEXAMETHASONE SODIUM PHOSPHATE 4 MG/ML IJ SOLN
4.0000 mg | Freq: Four times a day (QID) | INTRAMUSCULAR | Status: DC
  Administered 2015-10-15 – 2015-10-18 (×6): 4 mg via INTRAVENOUS
  Filled 2015-10-15 (×5): qty 1

## 2015-10-15 MED ORDER — PROPOFOL 10 MG/ML IV BOLUS
INTRAVENOUS | Status: AC
  Filled 2015-10-15: qty 20

## 2015-10-15 MED ORDER — LOSARTAN POTASSIUM-HCTZ 100-25 MG PO TABS: 1.0000 | ORAL_TABLET | Freq: Every day | ORAL | Status: DC

## 2015-10-15 MED ORDER — ONDANSETRON HCL 4 MG/2ML IJ SOLN
INTRAMUSCULAR | Status: DC | PRN
  Administered 2015-10-15: 4 mg via INTRAVENOUS

## 2015-10-15 MED ORDER — MIDAZOLAM HCL 5 MG/5ML IJ SOLN
INTRAMUSCULAR | Status: DC | PRN
  Administered 2015-10-15: 2 mg via INTRAVENOUS

## 2015-10-15 MED ORDER — EPHEDRINE SULFATE 50 MG/ML IJ SOLN
INTRAMUSCULAR | Status: DC | PRN
  Administered 2015-10-15: 10 mg via INTRAVENOUS

## 2015-10-15 MED ORDER — DIAZEPAM 5 MG PO TABS
5.0000 mg | ORAL_TABLET | Freq: Four times a day (QID) | ORAL | Status: DC | PRN
  Administered 2015-10-17 – 2015-10-18 (×2): 5 mg via ORAL
  Filled 2015-10-15 (×2): qty 1

## 2015-10-15 MED ORDER — DEXAMETHASONE SODIUM PHOSPHATE 10 MG/ML IJ SOLN
INTRAMUSCULAR | Status: AC
  Filled 2015-10-15: qty 1

## 2015-10-15 MED ORDER — ONDANSETRON HCL 4 MG/2ML IJ SOLN
4.0000 mg | INTRAMUSCULAR | Status: DC | PRN
  Administered 2015-10-15 – 2015-10-19 (×8): 4 mg via INTRAVENOUS
  Filled 2015-10-15 (×8): qty 2

## 2015-10-15 MED ORDER — LOSARTAN POTASSIUM 50 MG PO TABS
100.0000 mg | ORAL_TABLET | Freq: Every day | ORAL | Status: DC
  Administered 2015-10-16 – 2015-10-21 (×6): 100 mg via ORAL
  Filled 2015-10-15 (×7): qty 2

## 2015-10-15 MED ORDER — POVIDONE-IODINE 10 % EX OINT
TOPICAL_OINTMENT | CUTANEOUS | Status: DC | PRN
  Administered 2015-10-15: 1 via TOPICAL

## 2015-10-15 MED ORDER — PHENYLEPHRINE 40 MCG/ML (10ML) SYRINGE FOR IV PUSH (FOR BLOOD PRESSURE SUPPORT)
PREFILLED_SYRINGE | INTRAVENOUS | Status: AC
  Filled 2015-10-15: qty 10

## 2015-10-15 MED ORDER — HEMOSTATIC AGENTS (NO CHARGE) OPTIME
TOPICAL | Status: DC | PRN
  Administered 2015-10-15: 1 via TOPICAL

## 2015-10-15 MED ORDER — MEPERIDINE HCL 25 MG/ML IJ SOLN: 6.2500 mg | INTRAMUSCULAR | Status: DC | PRN

## 2015-10-15 MED ORDER — HYDROCHLOROTHIAZIDE 25 MG PO TABS
25.0000 mg | ORAL_TABLET | Freq: Every day | ORAL | Status: DC
  Administered 2015-10-16 – 2015-10-21 (×6): 25 mg via ORAL
  Filled 2015-10-15 (×6): qty 1

## 2015-10-15 MED ORDER — SENNA 8.6 MG PO TABS
1.0000 | ORAL_TABLET | Freq: Two times a day (BID) | ORAL | Status: DC
  Administered 2015-10-16 – 2015-10-21 (×11): 8.6 mg via ORAL
  Filled 2015-10-15 (×11): qty 1

## 2015-10-15 MED ORDER — HYDROMORPHONE HCL 1 MG/ML IJ SOLN
0.2500 mg | INTRAMUSCULAR | Status: DC | PRN
  Administered 2015-10-15: 0.5 mg via INTRAVENOUS
  Administered 2015-10-15: 0.25 mg via INTRAVENOUS

## 2015-10-15 MED ORDER — LACTATED RINGERS IV SOLN
INTRAVENOUS | Status: DC
  Administered 2015-10-15 (×2): via INTRAVENOUS

## 2015-10-15 MED ORDER — VANCOMYCIN HCL 1000 MG IV SOLR
INTRAVENOUS | Status: AC
  Filled 2015-10-15: qty 1000

## 2015-10-15 MED ORDER — SUGAMMADEX SODIUM 200 MG/2ML IV SOLN
INTRAVENOUS | Status: AC
  Filled 2015-10-15: qty 2

## 2015-10-15 MED ORDER — VANCOMYCIN HCL 1000 MG IV SOLR
INTRAVENOUS | Status: DC | PRN
  Administered 2015-10-15: 1000 mg via TOPICAL

## 2015-10-15 MED ORDER — DEXAMETHASONE SODIUM PHOSPHATE 10 MG/ML IJ SOLN
INTRAMUSCULAR | Status: DC | PRN
  Administered 2015-10-15: 10 mg via INTRAVENOUS

## 2015-10-15 MED ORDER — THROMBIN 5000 UNITS EX SOLR
OROMUCOSAL | Status: DC | PRN
  Administered 2015-10-15: 11:00:00 via TOPICAL

## 2015-10-15 MED ORDER — 0.9 % SODIUM CHLORIDE (POUR BTL) OPTIME
TOPICAL | Status: DC | PRN
  Administered 2015-10-15: 1000 mL

## 2015-10-15 MED ORDER — EPHEDRINE 5 MG/ML INJ
INTRAVENOUS | Status: AC
  Filled 2015-10-15: qty 10

## 2015-10-15 MED ORDER — CEFAZOLIN SODIUM 1-5 GM-% IV SOLN
1.0000 g | Freq: Three times a day (TID) | INTRAVENOUS | Status: AC
  Administered 2015-10-15 – 2015-10-16 (×2): 1 g via INTRAVENOUS
  Filled 2015-10-15 (×3): qty 50

## 2015-10-15 MED ORDER — VECURONIUM BROMIDE 10 MG IV SOLR
INTRAVENOUS | Status: AC
  Filled 2015-10-15: qty 10

## 2015-10-15 MED ORDER — LIDOCAINE 2% (20 MG/ML) 5 ML SYRINGE
INTRAMUSCULAR | Status: AC
  Filled 2015-10-15: qty 5

## 2015-10-15 MED ORDER — ROCURONIUM BROMIDE 100 MG/10ML IV SOLN
INTRAVENOUS | Status: DC | PRN
  Administered 2015-10-15: 50 mg via INTRAVENOUS

## 2015-10-15 MED ORDER — ONDANSETRON HCL 4 MG/2ML IJ SOLN
INTRAMUSCULAR | Status: AC
  Filled 2015-10-15: qty 2

## 2015-10-15 MED ORDER — PHENOL 1.4 % MT LIQD
1.0000 | OROMUCOSAL | Status: DC | PRN
Start: 2015-10-15 — End: 2015-10-21

## 2015-10-15 MED ORDER — FENTANYL CITRATE (PF) 250 MCG/5ML IJ SOLN
INTRAMUSCULAR | Status: DC | PRN
  Administered 2015-10-15: 150 ug via INTRAVENOUS
  Administered 2015-10-15 (×2): 50 ug via INTRAVENOUS

## 2015-10-15 MED ORDER — SODIUM CHLORIDE 0.9 % IJ SOLN
INTRAMUSCULAR | Status: AC
  Filled 2015-10-15: qty 10

## 2015-10-15 MED ORDER — ROCURONIUM BROMIDE 50 MG/5ML IV SOLN
INTRAVENOUS | Status: AC
  Filled 2015-10-15: qty 1

## 2015-10-15 MED ORDER — TRAMADOL HCL 50 MG PO TABS
50.0000 mg | ORAL_TABLET | Freq: Four times a day (QID) | ORAL | Status: DC | PRN
  Administered 2015-10-16 – 2015-10-21 (×14): 50 mg via ORAL
  Filled 2015-10-15 (×15): qty 1

## 2015-10-15 MED ORDER — PROPOFOL 10 MG/ML IV BOLUS
INTRAVENOUS | Status: DC | PRN
  Administered 2015-10-15: 120 mg via INTRAVENOUS

## 2015-10-15 MED ORDER — DEXAMETHASONE 4 MG PO TABS
4.0000 mg | ORAL_TABLET | Freq: Four times a day (QID) | ORAL | Status: DC
  Administered 2015-10-16 – 2015-10-18 (×6): 4 mg via ORAL
  Filled 2015-10-15 (×8): qty 1

## 2015-10-15 MED ORDER — SODIUM CHLORIDE 0.9% FLUSH: 3.0000 mL | INTRAVENOUS | Status: DC | PRN

## 2015-10-15 MED ORDER — SODIUM CHLORIDE 0.9 % IV SOLN: 250.0000 mL | INTRAVENOUS | Status: DC

## 2015-10-15 MED ORDER — SUCCINYLCHOLINE CHLORIDE 200 MG/10ML IV SOSY
PREFILLED_SYRINGE | INTRAVENOUS | Status: AC
  Filled 2015-10-15: qty 10

## 2015-10-15 MED ORDER — DEXAMETHASONE SODIUM PHOSPHATE 4 MG/ML IJ SOLN
INTRAMUSCULAR | Status: AC
  Filled 2015-10-15: qty 1

## 2015-10-15 MED ORDER — LIDOCAINE HCL (CARDIAC) 20 MG/ML IV SOLN
INTRAVENOUS | Status: DC | PRN
  Administered 2015-10-15: 20 mg via INTRAVENOUS

## 2015-10-15 MED ORDER — CARVEDILOL 3.125 MG PO TABS
3.1250 mg | ORAL_TABLET | Freq: Two times a day (BID) | ORAL | Status: DC
  Administered 2015-10-15 – 2015-10-21 (×12): 3.125 mg via ORAL
  Filled 2015-10-15 (×12): qty 1

## 2015-10-15 MED ORDER — SODIUM CHLORIDE 0.9 % IV SOLN: INTRAVENOUS | Status: DC

## 2015-10-15 MED ORDER — HYDROMORPHONE HCL 1 MG/ML IJ SOLN
INTRAMUSCULAR | Status: AC
  Filled 2015-10-15: qty 1

## 2015-10-15 MED ORDER — ACETAMINOPHEN 650 MG RE SUPP: 650.0000 mg | RECTAL | Status: DC | PRN

## 2015-10-15 MED ORDER — SUGAMMADEX SODIUM 200 MG/2ML IV SOLN
INTRAVENOUS | Status: DC | PRN
  Administered 2015-10-15: 200 mg via INTRAVENOUS

## 2015-10-15 MED ORDER — THROMBIN 5000 UNITS EX SOLR
CUTANEOUS | Status: DC | PRN
  Administered 2015-10-15 (×2): 5000 [IU] via TOPICAL

## 2015-10-15 MED ORDER — MORPHINE SULFATE (PF) 2 MG/ML IV SOLN
1.0000 mg | INTRAVENOUS | Status: DC | PRN
  Administered 2015-10-15: 2 mg via INTRAVENOUS
  Administered 2015-10-15: 1 mg via INTRAVENOUS
  Filled 2015-10-15 (×2): qty 1

## 2015-10-15 MED ORDER — FENTANYL CITRATE (PF) 250 MCG/5ML IJ SOLN
INTRAMUSCULAR | Status: AC
  Filled 2015-10-15: qty 5

## 2015-10-15 MED ORDER — SODIUM CHLORIDE 0.9% FLUSH
3.0000 mL | Freq: Two times a day (BID) | INTRAVENOUS | Status: DC
  Administered 2015-10-15 – 2015-10-20 (×10): 3 mL via INTRAVENOUS

## 2015-10-15 MED ORDER — PROMETHAZINE HCL 25 MG/ML IJ SOLN: 12.5000 mg | Freq: Four times a day (QID) | INTRAMUSCULAR | Status: DC | PRN

## 2015-10-15 MED ORDER — ASPIRIN 325 MG PO TABS
325.0000 mg | ORAL_TABLET | Freq: Every day | ORAL | Status: DC
  Administered 2015-10-16 – 2015-10-21 (×6): 325 mg via ORAL
  Filled 2015-10-15 (×6): qty 1

## 2015-10-15 MED ORDER — MIDAZOLAM HCL 2 MG/2ML IJ SOLN
INTRAMUSCULAR | Status: AC
  Filled 2015-10-15: qty 2

## 2015-10-15 MED ORDER — PROMETHAZINE HCL 25 MG/ML IJ SOLN: 6.2500 mg | INTRAMUSCULAR | Status: DC | PRN

## 2015-10-15 MED ORDER — OXYCODONE-ACETAMINOPHEN 5-325 MG PO TABS: 1.0000 | ORAL_TABLET | ORAL | Status: DC | PRN

## 2015-10-15 MED ORDER — MIDAZOLAM HCL 2 MG/2ML IJ SOLN: 0.5000 mg | Freq: Once | INTRAMUSCULAR | Status: DC | PRN

## 2015-10-15 MED ORDER — MENTHOL 3 MG MT LOZG: 1.0000 | LOZENGE | OROMUCOSAL | Status: DC | PRN

## 2015-10-15 SURGICAL SUPPLY — 83 items
BENZOIN TINCTURE PRP APPL 2/3 (GAUZE/BANDAGES/DRESSINGS) ×2 IMPLANT
BLADE CLIPPER SURG (BLADE) ×2 IMPLANT
BLADE SURG 11 STRL SS (BLADE) ×2 IMPLANT
BLADE ULTRA TIP 2M (BLADE) ×2 IMPLANT
BRUSH SCRUB EZ 1% IODOPHOR (MISCELLANEOUS) IMPLANT
BRUSH SCRUB EZ PLAIN DRY (MISCELLANEOUS) ×2 IMPLANT
BUR MATCHSTICK NEURO 3.0 LAGG (BURR) ×2 IMPLANT
CANISTER SUCT 3000ML PPV (MISCELLANEOUS) ×2 IMPLANT
CONT SPEC 4OZ CLIKSEAL STRL BL (MISCELLANEOUS) ×2 IMPLANT
COVER MAYO STAND STRL (DRAPES) IMPLANT
DECANTER SPIKE VIAL GLASS SM (MISCELLANEOUS) IMPLANT
DERMABOND ADVANCED (GAUZE/BANDAGES/DRESSINGS) ×1
DERMABOND ADVANCED .7 DNX12 (GAUZE/BANDAGES/DRESSINGS) ×1 IMPLANT
DRAPE C-ARM 42X72 X-RAY (DRAPES) ×4 IMPLANT
DRAPE LAPAROTOMY 100X72 PEDS (DRAPES) ×2 IMPLANT
DRAPE MICROSCOPE LEICA (MISCELLANEOUS) ×2 IMPLANT
DRAPE ORTHO SPLIT 77X108 STRL (DRAPES)
DRAPE POUCH INSTRU U-SHP 10X18 (DRAPES) ×2 IMPLANT
DRAPE SURG ORHT 6 SPLT 77X108 (DRAPES) IMPLANT
DRSG OPSITE POSTOP 4X6 (GAUZE/BANDAGES/DRESSINGS) ×2 IMPLANT
DURAPREP 6ML APPLICATOR 50/CS (WOUND CARE) ×2 IMPLANT
ELECT BLADE 4.0 EZ CLEAN MEGAD (MISCELLANEOUS) ×2
ELECT REM PT RETURN 9FT ADLT (ELECTROSURGICAL) ×2
ELECTRODE BLDE 4.0 EZ CLN MEGD (MISCELLANEOUS) ×1 IMPLANT
ELECTRODE REM PT RTRN 9FT ADLT (ELECTROSURGICAL) ×1 IMPLANT
EVACUATOR 1/8 PVC DRAIN (DRAIN) ×2 IMPLANT
GAUZE SPONGE 4X4 12PLY STRL (GAUZE/BANDAGES/DRESSINGS) IMPLANT
GAUZE SPONGE 4X4 16PLY XRAY LF (GAUZE/BANDAGES/DRESSINGS) IMPLANT
GLOVE BIO SURGEON STRL SZ 6.5 (GLOVE) IMPLANT
GLOVE BIO SURGEON STRL SZ7 (GLOVE) IMPLANT
GLOVE BIO SURGEON STRL SZ7.5 (GLOVE) IMPLANT
GLOVE BIO SURGEON STRL SZ8 (GLOVE) IMPLANT
GLOVE BIO SURGEON STRL SZ8.5 (GLOVE) IMPLANT
GLOVE BIOGEL M 8.0 STRL (GLOVE) ×2 IMPLANT
GLOVE ECLIPSE 6.5 STRL STRAW (GLOVE) IMPLANT
GLOVE ECLIPSE 7.0 STRL STRAW (GLOVE) IMPLANT
GLOVE ECLIPSE 7.5 STRL STRAW (GLOVE) IMPLANT
GLOVE ECLIPSE 8.0 STRL XLNG CF (GLOVE) IMPLANT
GLOVE ECLIPSE 8.5 STRL (GLOVE) IMPLANT
GLOVE EXAM NITRILE LRG STRL (GLOVE) IMPLANT
GLOVE EXAM NITRILE MD LF STRL (GLOVE) IMPLANT
GLOVE EXAM NITRILE XL STR (GLOVE) IMPLANT
GLOVE EXAM NITRILE XS STR PU (GLOVE) IMPLANT
GLOVE INDICATOR 6.5 STRL GRN (GLOVE) IMPLANT
GLOVE INDICATOR 7.0 STRL GRN (GLOVE) IMPLANT
GLOVE INDICATOR 7.5 STRL GRN (GLOVE) IMPLANT
GLOVE INDICATOR 8.0 STRL GRN (GLOVE) IMPLANT
GLOVE INDICATOR 8.5 STRL (GLOVE) IMPLANT
GLOVE OPTIFIT SS 8.0 STRL (GLOVE) IMPLANT
GOWN STRL REUS W/ TWL LRG LVL3 (GOWN DISPOSABLE) ×1 IMPLANT
GOWN STRL REUS W/ TWL XL LVL3 (GOWN DISPOSABLE) IMPLANT
GOWN STRL REUS W/TWL 2XL LVL3 (GOWN DISPOSABLE) IMPLANT
GOWN STRL REUS W/TWL LRG LVL3 (GOWN DISPOSABLE) ×1
GOWN STRL REUS W/TWL XL LVL3 (GOWN DISPOSABLE)
HEMOSTAT POWDER KIT SURGIFOAM (HEMOSTASIS) ×2 IMPLANT
KIT BASIN OR (CUSTOM PROCEDURE TRAY) ×2 IMPLANT
KIT ROOM TURNOVER OR (KITS) ×2 IMPLANT
NEEDLE HYPO 18GX1.5 BLUNT FILL (NEEDLE) IMPLANT
NEEDLE HYPO 25X1 1.5 SAFETY (NEEDLE) ×2 IMPLANT
NEEDLE SPNL 22GX3.5 QUINCKE BK (NEEDLE) IMPLANT
NS IRRIG 1000ML POUR BTL (IV SOLUTION) ×2 IMPLANT
PACK LAMINECTOMY NEURO (CUSTOM PROCEDURE TRAY) ×2 IMPLANT
PAD ARMBOARD 7.5X6 YLW CONV (MISCELLANEOUS) ×6 IMPLANT
PATTIES SURGICAL .25X.25 (GAUZE/BANDAGES/DRESSINGS) IMPLANT
PIN MAYFIELD SKULL DISP (PIN) ×2 IMPLANT
RUBBERBAND STERILE (MISCELLANEOUS) ×4 IMPLANT
SPONGE LAP 4X18 X RAY DECT (DISPOSABLE) IMPLANT
STAPLER SKIN PROX WIDE 3.9 (STAPLE) IMPLANT
STRIP CLOSURE SKIN 1/2X4 (GAUZE/BANDAGES/DRESSINGS) ×2 IMPLANT
SUT ETHILON 3 0 FSL (SUTURE) IMPLANT
SUT ETHILON 4 0 PS 2 18 (SUTURE) IMPLANT
SUT VIC AB 0 CT1 18XCR BRD 8 (SUTURE) IMPLANT
SUT VIC AB 0 CT1 18XCR BRD8 (SUTURE) ×1 IMPLANT
SUT VIC AB 0 CT1 8-18 (SUTURE) ×1
SUT VIC AB 2-0 CP2 18 (SUTURE) ×2 IMPLANT
SUT VIC AB 3-0 SH 8-18 (SUTURE) ×2 IMPLANT
SUT VIC AB 4-0 P-3 18X BRD (SUTURE) IMPLANT
SUT VIC AB 4-0 P3 18 (SUTURE)
SYR 3ML LL SCALE MARK (SYRINGE) IMPLANT
TOWEL OR 17X24 6PK STRL BLUE (TOWEL DISPOSABLE) ×2 IMPLANT
TOWEL OR 17X26 10 PK STRL BLUE (TOWEL DISPOSABLE) ×2 IMPLANT
UNDERPAD 30X30 INCONTINENT (UNDERPADS AND DIAPERS) IMPLANT
WATER STERILE IRR 1000ML POUR (IV SOLUTION) ×2 IMPLANT

## 2015-10-15 NOTE — OR Nursing (Signed)
Pt was source for blood exposure to staff member. Per hospital policy blood was drawn for exposure panel.

## 2015-10-15 NOTE — Anesthesia Postprocedure Evaluation (Signed)
Anesthesia Post Note  Patient: DARASIMI WALP  Procedure(s) Performed: Procedure(s) (LRB): Left Cervical four- five Hemilaminectomy/Remove mass (Left)  Patient location during evaluation: PACU Anesthesia Type: General Level of consciousness: oriented, sedated and patient cooperative Pain management: pain level controlled Vital Signs Assessment: post-procedure vital signs reviewed and stable Respiratory status: spontaneous breathing, nonlabored ventilation, respiratory function stable and patient connected to nasal cannula oxygen Cardiovascular status: blood pressure returned to baseline and stable Postop Assessment: no signs of nausea or vomiting Anesthetic complications: no    Last Vitals:  Filed Vitals:   10/15/15 1300 10/15/15 1315  BP: 184/81 188/74  Pulse: 72 72  Temp:    Resp: 30 22    Last Pain:  Filed Vitals:   10/15/15 1315  PainSc: Asleep                 Finnegan Gatta,E. Zethan Alfieri

## 2015-10-15 NOTE — Progress Notes (Signed)
Patient still having spells of nausea not controled by Zofran will give 12.5 of phenergan, will continue to monitor.

## 2015-10-15 NOTE — Anesthesia Preprocedure Evaluation (Addendum)
Anesthesia Evaluation  Patient identified by MRN, date of birth, ID band Patient awake    Reviewed: Allergy & Precautions, NPO status , Patient's Chart, lab work & pertinent test results  History of Anesthesia Complications Negative for: history of anesthetic complications  Airway Mallampati: II  TM Distance: >3 FB Neck ROM: Full    Dental  (+) Dental Advisory Given   Pulmonary sleep apnea and Continuous Positive Airway Pressure Ventilation ,    breath sounds clear to auscultation       Cardiovascular hypertension, Pt. on medications and Pt. on home beta blockers + Peripheral Vascular Disease   Rhythm:Regular Rate:Normal  Recent stress test: normal perfusion  '14 ECHO: EF 60-65%, mild LVH, valves OK   Neuro/Psych  Headaches, Depression    GI/Hepatic Neg liver ROS, GERD  Controlled,  Endo/Other  Morbid obesity  Renal/GU negative Renal ROS     Musculoskeletal  (+) Arthritis , Osteoarthritis,    Abdominal (+) + obese,   Peds  Hematology negative hematology ROS (+)   Anesthesia Other Findings Breast cancer: XRT  Reproductive/Obstetrics                           Anesthesia Physical Anesthesia Plan  ASA: III  Anesthesia Plan: General   Post-op Pain Management:    Induction: Intravenous  Airway Management Planned: Oral ETT  Additional Equipment:   Intra-op Plan:   Post-operative Plan: Extubation in OR  Informed Consent: I have reviewed the patients History and Physical, chart, labs and discussed the procedure including the risks, benefits and alternatives for the proposed anesthesia with the patient or authorized representative who has indicated his/her understanding and acceptance.   Dental advisory given  Plan Discussed with: CRNA and Surgeon  Anesthesia Plan Comments: (Plan routine monitors, GETA)        Anesthesia Quick Evaluation

## 2015-10-15 NOTE — Progress Notes (Signed)
Orthopedic Tech Progress Note Patient Details:  Lindsey Bentley [birth year] [id number]  Ortho Devices Type of Ortho Device: Soft collar Ortho Device/Splint Interventions: Application   [name] 10/15/2015, 12:25 PM

## 2015-10-15 NOTE — Anesthesia Procedure Notes (Signed)
Procedure Name: Intubation Date/Time: 10/15/2015 9:12 AM Performed by: Myna Bright Pre-anesthesia Checklist: Patient identified, Emergency Drugs available, Suction available and Patient being monitored Patient Re-evaluated:Patient Re-evaluated prior to inductionOxygen Delivery Method: Circle system utilized Preoxygenation: Pre-oxygenation with 100% oxygen Intubation Type: IV induction Ventilation: Mask ventilation without difficulty Laryngoscope Size: Mac and 3 Grade View: Grade II Tube type: Oral Tube size: 7.5 mm Number of attempts: 1 Airway Equipment and Method: Stylet Placement Confirmation: ETT inserted through vocal cords under direct vision,  positive ETCO2 and breath sounds checked- equal and bilateral Secured at: 21 cm Tube secured with: Tape Dental Injury: Teeth and Oropharynx as per pre-operative assessment

## 2015-10-15 NOTE — Transfer of Care (Signed)
Immediate Anesthesia Transfer of Care Note  Patient: Lindsey Bentley  Procedure(s) Performed: Procedure(s) with comments: Left Cervical four- five Hemilaminectomy/Remove mass (Left) - Left C4-5 Hemilaminectomy/Remove mass  Patient Location: PACU  Anesthesia Type:General  Level of Consciousness: awake, alert , oriented and patient cooperative  Airway & Oxygen Therapy: Patient Spontanous Breathing and Patient connected to nasal cannula oxygen  Post-op Assessment: Report given to RN, Post -op Vital signs reviewed and stable and Patient moving all extremities  Post vital signs: Reviewed and stable  Last Vitals:  Filed Vitals:   10/15/15 0640  BP: 159/77  Pulse: 71  Temp: 37.1 C  Resp: 20    Last Pain:  Filed Vitals:   10/15/15 0726  PainSc: 2          Complications: No apparent anesthesia complications

## 2015-10-16 ENCOUNTER — Encounter (HOSPITAL_COMMUNITY): Payer: Self-pay | Admitting: Neurosurgery

## 2015-10-16 DIAGNOSIS — I10 Essential (primary) hypertension: Secondary | ICD-10-CM | POA: Diagnosis not present

## 2015-10-16 DIAGNOSIS — I739 Peripheral vascular disease, unspecified: Secondary | ICD-10-CM | POA: Diagnosis not present

## 2015-10-16 DIAGNOSIS — M50121 Cervical disc disorder at C4-C5 level with radiculopathy: Secondary | ICD-10-CM | POA: Diagnosis not present

## 2015-10-16 DIAGNOSIS — M6748 Ganglion, other site: Secondary | ICD-10-CM | POA: Diagnosis not present

## 2015-10-16 DIAGNOSIS — K219 Gastro-esophageal reflux disease without esophagitis: Secondary | ICD-10-CM | POA: Diagnosis not present

## 2015-10-16 DIAGNOSIS — I2510 Atherosclerotic heart disease of native coronary artery without angina pectoris: Secondary | ICD-10-CM | POA: Diagnosis not present

## 2015-10-16 DIAGNOSIS — E785 Hyperlipidemia, unspecified: Secondary | ICD-10-CM | POA: Diagnosis not present

## 2015-10-16 DIAGNOSIS — G4733 Obstructive sleep apnea (adult) (pediatric): Secondary | ICD-10-CM | POA: Diagnosis not present

## 2015-10-16 DIAGNOSIS — M1990 Unspecified osteoarthritis, unspecified site: Secondary | ICD-10-CM | POA: Diagnosis not present

## 2015-10-16 NOTE — Evaluation (Signed)
Occupational Therapy Evaluation Patient Details Name: Lindsey Bentley MRN: [id number] DOB: 06-27-1945 Today's Date: 10/16/2015    History of Present Illness Pt with c/o neck pain and altered sensation in face and shoulder. On 6/6 pt s/p L C4-5 hemilaminectomy for removal of calcified mass.   Clinical Impression   Pt reports she was independent with ADLs and mobility PTA. Currently pt overall min assist for functional mobility and LB ADLs, min guard for safety with UB ADLs. Began safety, cervical, and ADL education with pt and daughter. Pt able demo good ability to maintain cervical precautions throughout all functional activities during session. Pt planning to d/c home with near 24/7 supervision initially from family. Pt would benefit from continued skilled OT to address established goals.    Follow Up Recommendations  No OT follow up;Supervision/Assistance - 24 hour    Equipment Recommendations  3 in 1 bedside comode    Recommendations for Other Services       Precautions / Restrictions Precautions Precautions: Cervical Precaution Comments: Reviewed handout with pt and daughter Required Braces or Orthoses: Cervical Brace Cervical Brace: Soft collar;At all times Restrictions Weight Bearing Restrictions: No      Mobility Bed Mobility        General bed mobility comments: Pt OOB in chair upon arrival  Transfers Overall transfer level: Needs assistance Equipment used: 1 person hand held assist Transfers: Sit to/from Stand Sit to Stand: Min assist         General transfer comment: Min assist for balance in standing; pt able to perform sit to stand with min guard and no physical assist. VCs for hand placement.    Balance Overall balance assessment: Needs assistance Sitting-balance support: Feet supported;No upper extremity supported Sitting balance-Leahy Scale: Good   Standing balance support: No upper extremity supported;During functional activity Standing  balance-Leahy Scale: Fair                              ADL Overall ADL's : Needs assistance/impaired Eating/Feeding: Set up;Sitting   Grooming: Min guard;Standing;Oral care Grooming Details (indicate cue type and reason): Educated on use of 2 cups for oral care; pt able to return demo technique. Upper Body Bathing: Set up;Supervision/ safety;Sitting   Lower Body Bathing: Minimal assistance;Sit to/from stand   Upper Body Dressing : Set up;Supervision/safety;Sitting   Lower Body Dressing: Minimal assistance;Sit to/from stand Lower Body Dressing Details (indicate cue type and reason): Pt unable to cross foot over opposite knee. Discussed use of AE vs. assist from family; pt and daughter agree that family can assist with LB ADLs as needed, no need for AE at this time. Toilet Transfer: Minimal assistance;Ambulation;BSC (BSC over toilet, hand held assist)   Toileting- Clothing Manipulation and Hygiene: Set up;Min guard;Sit to/from stand Toileting - Clothing Manipulation Details (indicate cue type and reason): For peri care only. Educated pt on use of toilet tongs for independence with peri care and maintaining cervical precautions. Tub/ Shower Transfer: Minimal assistance;Walk-in shower;Ambulation Tub/Shower Transfer Details (indicate cue type and reason): Educated on use of 3 in 1 in shower as a seat. Functional mobility during ADLs: Minimal assistance (hand held assist) General ADL Comments: Educated pt and daugher on home safety, maintaining cervical precautions during functional activities, proper body mechanics.     Vision Vision Assessment?: No apparent visual deficits   Perception     Praxis      Pertinent Vitals/Pain Pain Assessment: Faces Pain Score: 5  Faces  Pain Scale: Hurts little more Pain Location: neck, upper shoulders Pain Descriptors / Indicators: Sore Pain Intervention(s): Monitored during session     Hand Dominance Right   Extremity/Trunk  Assessment Upper Extremity Assessment Upper Extremity Assessment: Overall WFL for tasks assessed (Swelling in L hand)   Lower Extremity Assessment Lower Extremity Assessment: Defer to PT evaluation   Cervical / Trunk Assessment Cervical / Trunk Assessment:  (cervical surgery)   Communication Communication Communication: No difficulties   Cognition Arousal/Alertness: Awake/alert Behavior During Therapy: WFL for tasks assessed/performed Overall Cognitive Status: Within Functional Limits for tasks assessed                     General Comments       Exercises       Shoulder Instructions      Home Living Family/patient expects to be discharged to:: Private residence Living Arrangements: Spouse/significant other Available Help at Discharge: Family;Available PRN/intermittently (daughter will be with pt close to 24/7 initially) Type of Home: House Home Access: Stairs to enter [employer] of Steps: 3 Entrance Stairs-Rails: Left Home Layout: One level     Bathroom Shower/Tub: Walk-in shower;Tub/shower unit (Plans to use walk in initially)   Bathroom Toilet: Standard     Home Equipment: None   Additional Comments: pt's spouse works but daughter lives 3 blocks down      Prior Functioning/Environment Level of Independence: Independent             OT Diagnosis: Acute pain;Generalized weakness   OT Problem List: Decreased strength;Impaired balance (sitting and/or standing);Decreased knowledge of use of DME or AE;Decreased knowledge of precautions;Pain   OT Treatment/Interventions: Self-care/ADL training;Energy conservation;DME and/or AE instruction;Patient/family education;Balance training    OT Goals(Current goals can be found in the care plan section) Acute Rehab OT Goals Patient Stated Goal: get better and go home OT Goal Formulation: With patient Time For Goal Achievement: 10/30/15 Potential to Achieve Goals: Good ADL Goals Pt Will Perform  Grooming: standing;with supervision Pt Will Perform Upper Body Bathing: sitting;with modified independence Pt Will Perform Lower Body Bathing: with supervision;sit to/from stand Pt Will Transfer to Toilet: ambulating;bedside commode;with supervision Pt Will Perform Toileting - Clothing Manipulation and hygiene: with supervision;with adaptive equipment;sit to/from stand Pt Will Perform Tub/Shower Transfer: Shower transfer;with supervision;ambulating;3 in 1;rolling walker Additional ADL Goal #1: Pt will independently maintain cervical precautions throughout ADL. Additional ADL Goal #2: Pt/caregiver will independently don/doff cervical collar as precursor for ADLs and functional mobility.  OT Frequency: Min 2X/week   Barriers to D/C:            Co-evaluation              End of Session Equipment Utilized During Treatment: Cervical collar Nurse Communication: Mobility status;Other (comment) (pt ready for nausea medicine )  Activity Tolerance: Patient tolerated treatment well Patient left: in chair;with call bell/phone within reach;with chair alarm set;with family/visitor present   Time: 1022-1050 OT Time Calculation (min): 28 min Charges:  OT General Charges $OT Visit: 1 Procedure OT Evaluation $OT Eval Moderate Complexity: 1 Procedure OT Treatments $Self Care/Home Management : 8-22 mins G-Codes:     Binnie Kand M.S., OTR/L Pager: [phone]  10/16/2015, 11:04 AM

## 2015-10-16 NOTE — Op Note (Signed)
Lindsey Bentley, Lindsey Bentley               ACCOUNT NO.:  [id number]  MEDICAL RECORD NO.:  [id number]  LOCATION:  5M06C                        FACILITY:  [facility]  PHYSICIAN:  Leeroy Cha, M.D.   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  Degenerative disk disease.  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 Degenerative disk disease.  PROCEDURE:  Left cervical 4-5 hemilaminectomy, removal of calcified mass affecting the lateral aspect of the spinal cord, and the C5 and C4 nerve root.  Microscope.  SURGEON:  Leeroy Cha, M.D.  CLINICAL HISTORY:  The lady came to see me in my office because of neck pain radiating to both upper extremities.  I found that she had weakness of the deltoid.  Although, she had some degenerative disk disease, the main finding was a large calcified mass posterolaterally at the level of the cervical 4-5 with displacement of the spinal cord to the right side. I spoke with her and her daughter.  Surgery was advised.  They knew the risks and benefits.  They are fully aware that this will not correct her overall degenerative disk disease of the cervical spine and that might require further surgery.  DESCRIPTION OF PROCEDURE:  The patient was taken to the OR, and after intubation, 3 pins were applied to the head and she was positioned in a prone manner.  Then traction of the shoulder was made and with the skin from the posterior head to neck and upper thoracic area was scrubbed with Betadine and DuraPrep.  Then, we felt the spinous process.  We did a midline incision approximately from C3-4 down to C5-6.  Muscle retracted laterally.  We had took 2 x-rays, showed the area was quite at the level of C4-C5.  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 Microdissection was achieved and using the 1 and 2 mm punch as well as the drill we were able to do a total gross resection.  At the end, we had plenty of space for the cord and the foramen was open.  From then on, the area was irrigated.  Vancomycin powder was left in the operative site and the wound was closed with Vicryl and Dermabond.  A Drain was left in the area at least overnight.         ______________________________ Leeroy Cha, M.D.    EB/MEDQ  D:  10/15/2015  T:  10/16/2015  Job:  [id number]

## 2015-10-16 NOTE — Progress Notes (Signed)
[name], female   DOB: 13-Jun-1945, 70 y.o.   MRN: [id number] C/o incisional pain. No weakness

## 2015-10-16 NOTE — Care Management Note (Signed)
Case Management Note  Patient Details  Name: TRINA DEATON MRN: [id number] Date of Birth: December 26, 1945  Subjective/Objective:   Pt admitted and underwent:  Left Cervical four- five Hemilaminectomy/Remove mass. She is from home with her spouse.           Action/Plan: Awaiting PT/OT recommendations. CM following for discharge needs.   Expected Discharge Date:                  Expected Discharge Plan:     In-House Referral:     Discharge planning Services     Post Acute Care Choice:    Choice offered to:     DME Arranged:    DME Agency:     HH Arranged:    HH Agency:     Status of Service:  In process, will continue to follow  Medicare Important Message Given:  Yes Date Medicare IM Given:    Medicare IM give by:    Date Additional Medicare IM Given:    Additional Medicare Important Message give by:     If discussed at [place] of Stay Meetings, dates discussed:    Additional Comments:  Pollie Friar, RN 10/16/2015, 2:23 PM

## 2015-10-16 NOTE — Care Management Important Message (Signed)
Important Message  Patient Details  Name: Lindsey Bentley MRN: [id number] Date of Birth: 02/13/1946   Medicare Important Message Given:  Yes    [name] Abena 10/16/2015, 10:15 AM

## 2015-10-16 NOTE — Evaluation (Signed)
Physical Therapy Evaluation Patient Details Name: Lindsey Bentley MRN: [id number] DOB: [birth year] Today's Date: 10/16/2015   History of Present Illness  Pt with c/o neck pain and altered sensation in face and shoulder. On 6/6 pt s/p L C4-5 hemilaminectomy for removal of calcified mass.  Clinical Impression  Patient is s/p above surgery resulting in the deficits listed below (see PT Problem List). Pt requiring assist for transfer out of bed and LB dressing. Patient will benefit from skilled PT to increase their independence and safety with mobility (while adhering to their precautions) to allow discharge to the venue listed below.     Follow Up Recommendations Home health PT;Supervision/Assistance - 24 hour (may progress and not need HHPT)    Equipment Recommendations  Rolling walker with 5" wheels (may progress well enough and not need it)    Recommendations for Other Services       Precautions / Restrictions Precautions Precautions: Cervical Precaution Comments: handout provided Required Braces or Orthoses: Cervical Brace Cervical Brace: Soft collar;At all times Restrictions Weight Bearing Restrictions: No      Mobility  Bed Mobility Overal bed mobility: Needs Assistance Bed Mobility: Rolling;Sidelying to Sit Rolling: Min assist;Mod assist Sidelying to sit: Min assist;Mod assist       General bed mobility comments: pt with increased pain. v/c's for log roll to minimize twisting  Transfers Overall transfer level: Needs assistance Equipment used: 1 person hand held assist Transfers: Sit to/from Stand Sit to Stand: Min assist;Mod assist         General transfer comment: incraseed time, guarded due to pain  Ambulation/Gait Ambulation/Gait assistance: Mod assist;Min assist Ambulation Distance (Feet): 80 Feet Assistive device: 1 person hand held assist Gait Pattern/deviations: Step-through pattern;Decreased stride length Gait velocity: slow Gait velocity  interpretation: Below normal speed for age/gender General Gait Details: pt with onset of L heel pain causing antalgic limp but improved with gait. pt currently guarded  Stairs            Wheelchair Mobility    Modified Rankin (Stroke Patients Only)       Balance Overall balance assessment: Needs assistance Sitting-balance support: No upper extremity supported;Feet supported Sitting balance-Leahy Scale: Fair Sitting balance - Comments: attempted to don socks   Standing balance support: Single extremity supported Standing balance-Leahy Scale: Fair                               Pertinent Vitals/Pain Pain Assessment: 0-10 Pain Score: 5  Pain Location: neck, across shoulders, 5 at rest in bed, 7 when up sitting EOB Pain Descriptors / Indicators: Sore Pain Intervention(s): Monitored during session    Home Living Family/patient expects to be discharged to:: Private residence Living Arrangements: Spouse/significant other Available Help at Discharge: Family;Available PRN/intermittently Type of Home: House Home Access: Stairs to enter Entrance Stairs-Rails: Left Entrance Stairs-Number of Steps: 3 Home Layout: One level Home Equipment: None Additional Comments: pt's spouse works but daughter lives 3 blocks down    Prior Function Level of Independence: Independent               Hand Dominance   Dominant Hand: Right    Extremity/Trunk Assessment   Upper Extremity Assessment: Generalized weakness (due to pain from surgery,able to use functionally)           Lower Extremity Assessment: Overall WFL for tasks assessed      Cervical / Trunk Assessment:  (cervical surgery)  Communication  Communication: No difficulties  Cognition Arousal/Alertness: Awake/alert Behavior During Therapy: WFL for tasks assessed/performed Overall Cognitive Status: Within Functional Limits for tasks assessed                      General Comments General  comments (skin integrity, edema, etc.): pt with bilat hand swelling L >R    Exercises        Assessment/Plan    PT Assessment Patient needs continued PT services  PT Diagnosis Difficulty walking;Acute pain   PT Problem List Decreased strength;Decreased range of motion;Decreased activity tolerance;Decreased balance;Decreased mobility  PT Treatment Interventions DME instruction;Gait training;Stair training;Functional mobility training;Therapeutic activities;Therapeutic exercise;Balance training   PT Goals (Current goals can be found in the Care Plan section) Acute Rehab PT Goals Patient Stated Goal: get better and go home PT Goal Formulation: With patient Time For Goal Achievement: 10/23/15 Potential to Achieve Goals: Good    Frequency Min 5X/week   Barriers to discharge        Co-evaluation               End of Session Equipment Utilized During Treatment: Gait belt Activity Tolerance: Patient tolerated treatment well Patient left: in chair;with call bell/phone within reach Nurse Communication: Mobility status         Time: [id number] PT Time Calculation (min) (ACUTE ONLY): 23 min   Charges:   PT Evaluation $PT Eval Moderate Complexity: 1 Procedure PT Treatments $Gait Training: 8-22 mins   PT G CodesKingsley Callander 10/16/2015, 8:22 AM   Kittie Plater, PT, DPT Pager #: [phone] Office #: [phone]

## 2015-10-17 DIAGNOSIS — I2510 Atherosclerotic heart disease of native coronary artery without angina pectoris: Secondary | ICD-10-CM | POA: Diagnosis not present

## 2015-10-17 DIAGNOSIS — M6748 Ganglion, other site: Secondary | ICD-10-CM | POA: Diagnosis not present

## 2015-10-17 DIAGNOSIS — G4733 Obstructive sleep apnea (adult) (pediatric): Secondary | ICD-10-CM | POA: Diagnosis not present

## 2015-10-17 DIAGNOSIS — I739 Peripheral vascular disease, unspecified: Secondary | ICD-10-CM | POA: Diagnosis not present

## 2015-10-17 DIAGNOSIS — M50121 Cervical disc disorder at C4-C5 level with radiculopathy: Secondary | ICD-10-CM | POA: Diagnosis not present

## 2015-10-17 DIAGNOSIS — K219 Gastro-esophageal reflux disease without esophagitis: Secondary | ICD-10-CM | POA: Diagnosis not present

## 2015-10-17 DIAGNOSIS — E785 Hyperlipidemia, unspecified: Secondary | ICD-10-CM | POA: Diagnosis not present

## 2015-10-17 DIAGNOSIS — I10 Essential (primary) hypertension: Secondary | ICD-10-CM | POA: Diagnosis not present

## 2015-10-17 DIAGNOSIS — M1990 Unspecified osteoarthritis, unspecified site: Secondary | ICD-10-CM | POA: Diagnosis not present

## 2015-10-17 MED ORDER — HYDROCHLOROTHIAZIDE 25 MG PO TABS
25.0000 mg | ORAL_TABLET | Freq: Once | ORAL | Status: AC
  Administered 2015-10-17: 25 mg via ORAL
  Filled 2015-10-17: qty 1

## 2015-10-17 NOTE — Progress Notes (Signed)
Physical Therapy Treatment Patient Details Name: Lindsey Bentley MRN: [id number] DOB: 02-06-1946 Today's Date: 10/17/2015    History of Present Illness Pt with c/o neck pain and altered sensation in face and shoulder. On 6/6 pt s/p L C4-5 hemilaminectomy for removal of calcified mass.    PT Comments    Patient able to negotiate stairs this session sufficient for home entry.  Also feel improving with transfers, but still feel HHPT and RW indicated at this time for safety and progressing so can visualize when able to remove soft collar.  Follow Up Recommendations  Home health PT;Supervision/Assistance - 24 hour     Equipment Recommendations  Rolling walker with 5" wheels    Recommendations for Other Services       Precautions / Restrictions Precautions Precautions: Cervical Precaution Comments: Reviewed precautions Required Braces or Orthoses: Cervical Brace Cervical Brace: Soft collar Restrictions Weight Bearing Restrictions: No    Mobility  Bed Mobility   Bed Mobility: Sit to Sidelying         Sit to sidelying: Supervision General bed mobility comments: cues for positioning  Transfers Overall transfer level: Needs assistance Equipment used: None Transfers: Sit to/from Stand Sit to Stand: Supervision         General transfer comment: cue to push from arms of chair  Ambulation/Gait Ambulation/Gait assistance: Supervision;Min guard Ambulation Distance (Feet): 110 Feet Assistive device: None;Rolling walker (2 wheeled) Gait Pattern/deviations: Step-through pattern;Decreased stride length     General Gait Details: used walker part way for education with cues for proximity, turns and posture; able to walk without walker, but still c/o increased pain in neck   Stairs Stairs: Yes Stairs assistance: Min guard Stair Management: One rail Left;Sideways;Step to pattern Number of Stairs: 3 General stair comments: cues for technique, daughter present and educated how  to assist for home entry; pt using feet to feel edge of step since unable to visualize with cervical precautions  Wheelchair Mobility    Modified Rankin (Stroke Patients Only)       Balance Overall balance assessment: Needs assistance Sitting-balance support: Feet supported;No upper extremity supported Sitting balance-Leahy Scale: Good     Standing balance support: No upper extremity supported;During functional activity Standing balance-Leahy Scale: Good                      Cognition Arousal/Alertness: Awake/alert Behavior During Therapy: WFL for tasks assessed/performed Overall Cognitive Status: Within Functional Limits for tasks assessed                      Exercises      General Comments        Pertinent Vitals/Pain Pain Assessment: Faces Faces Pain Scale: Hurts even more Pain Location: neck after ambulation Pain Descriptors / Indicators: Aching;Guarding;Grimacing Pain Intervention(s): Repositioned;Monitored during session    Home Living                      Prior Function            PT Goals (current goals can now be found in the care plan section) Acute Rehab PT Goals Patient Stated Goal: home tomorrow Progress towards PT goals: Progressing toward goals    Frequency       PT Plan Current plan remains appropriate    Co-evaluation             End of Session Equipment Utilized During Treatment: Gait belt;Cervical collar Activity Tolerance: Patient tolerated treatment  well Patient left: in bed;with call bell/phone within reach;with family/visitor present     Time: [id number] PT Time Calculation (min) (ACUTE ONLY): 19 min  Charges:  $Gait Training: 8-22 mins                    G Codes:      Lindsey Bentley 10/18/2015, 5:30 PM  Lindsey Bentley, [place] 18-Oct-2015

## 2015-10-17 NOTE — Care Management Obs Status (Addendum)
[facility] NOTIFICATION   Patient Details  Name: Lindsey Bentley MRN: [id number] Date of Birth: 1945/09/02   Medicare Observation Status Notification Given:  Yes   Pollie Friar, RN 10/17/2015, 2:25 PM

## 2015-10-17 NOTE — Progress Notes (Signed)
Occupational Therapy Treatment Patient Details Name: Lindsey Bentley MRN: [id number] DOB: May 12, 1945 Today's Date: 10/17/2015    History of present illness Pt with c/o neck pain and altered sensation in face and shoulder. On 6/6 pt s/p L C4-5 hemilaminectomy for removal of calcified mass.   OT comments  Pt making good progress toward OT goals this session. Pt able to perform functional mobility with min guard assist and supervision for static standing at sink to complete grooming activities. Pt required min assist overall for bathing and dressing at this time; daughter present and feels comfortable with assisting pt upon return home. Pt with good demo of maintaining precautions during functional activities. Reviewed cervical precautions and educated on donning/doffing cervical collar. D/c plan remains appropriate. Will continue to follow acutely.   Follow Up Recommendations  No OT follow up;Supervision/Assistance - 24 hour    Equipment Recommendations  3 in 1 bedside comode    Recommendations for Other Services      Precautions / Restrictions Precautions Precautions: Cervical Precaution Comments: Reviewed precautions Required Braces or Orthoses: Cervical Brace Cervical Brace: Soft collar;At all times Restrictions Weight Bearing Restrictions: No       Mobility Bed Mobility               General bed mobility comments: Pt OOB upon arrival.  Transfers Overall transfer level: Needs assistance Equipment used: None Transfers: Sit to/from Stand Sit to Stand: Min guard         General transfer comment: Min guard for safety with multiple sit >stand from chair and BSC. Good hand placement and techniuqe.    Balance Overall balance assessment: Needs assistance Sitting-balance support: Feet supported;No upper extremity supported Sitting balance-Leahy Scale: Good     Standing balance support: No upper extremity supported;During functional activity Standing balance-Leahy  Scale: Fair                     ADL Overall ADL's : Needs assistance/impaired     Grooming: Supervision/safety;Oral care;Wash/dry face;Sitting;Standing;Set up;Applying deodorant   Upper Body Bathing: Minimal assitance;Sitting Upper Body Bathing Details (indicate cue type and reason): Assist to wash neck and back Lower Body Bathing: Minimal assistance;Sit to/from stand Lower Body Bathing Details (indicate cue type and reason): Assist for feet and bottom. Discussed use of long handled sponge for increased independence with bathing. Upper Body Dressing : Minimal assistance;Sitting Upper Body Dressing Details (indicate cue type and reason): Min assist to doff/don cervical collar. Pt able to doff/down gown with set up. Educated daughter on proper donning/doffing of cervical collar. Lower Body Dressing: Maximal assistance Lower Body Dressing Details (indicate cue type and reason): For socks only Toilet Transfer: Min guard;Ambulation;BSC           Functional mobility during ADLs: Min guard General ADL Comments: Reviewed maintaining precautions during functional activities, do not scrub on incision and pat dry, cervical collar management.      Vision                     Perception     Praxis      Cognition   Behavior During Therapy: [facility] for tasks assessed/performed Overall Cognitive Status: Within Functional Limits for tasks assessed                       Extremity/Trunk Assessment               Exercises     Shoulder Instructions  General Comments      Pertinent Vitals/ Pain       Pain Assessment: Faces Faces Pain Scale: Hurts little more Pain Location: neck after ADL Pain Descriptors / Indicators: Sore;Grimacing Pain Intervention(s): Monitored during session;Repositioned  Home Living                                          Prior Functioning/Environment              Frequency Min 2X/week     Progress  Toward Goals  OT Goals(current goals can now be found in the care plan section)  Progress towards OT goals: Progressing toward goals  Acute Rehab OT Goals Patient Stated Goal: home tomorrow OT Goal Formulation: With patient/family  Plan Discharge plan remains appropriate    Co-evaluation                 End of Session Equipment Utilized During Treatment: Cervical collar   Activity Tolerance Patient tolerated treatment well   Patient Left in chair;with call bell/phone within reach;with chair alarm set;with family/visitor present   Nurse Communication      Functional Assessment Tool Used: Clinical judgement Functional Limitation: Self care Self Care Current Status [id number]): At least 1 percent but less than 20 percent impaired, limited or restricted Self Care Goal Status [id number]): At least 1 percent but less than 20 percent impaired, limited or restricted   Time: 1345-1408 OT Time Calculation (min): 23 min  Charges: OT G-codes **NOT FOR INPATIENT CLASS** Functional Assessment Tool Used: Clinical judgement Functional Limitation: Self care Self Care Current Status [id number]): At least 1 percent but less than 20 percent impaired, limited or restricted Self Care Goal Status [id number]): At least 1 percent but less than 20 percent impaired, limited or restricted OT General Charges $OT Visit: 1 Procedure OT Treatments $Self Care/Home Management : 23-37 mins   Binnie Kand M.S., OTR/L Pager: [phone]  10/17/2015, 2:42 PM

## 2015-10-17 NOTE — Progress Notes (Signed)
OT Cancellation Note  Patient Details Name: Lindsey Bentley MRN: [id number] DOB: [birth year]   Cancelled Treatment:    Reason Eval/Treat Not Completed: Other (comment) (pt just started eating breakfast). Requesting for OT to check back later today for treatment. Will follow up as time allows.  Binnie Kand M.S., OTR/L Pager: [phone]  10/17/2015, 8:09 AM

## 2015-10-17 NOTE — Progress Notes (Signed)
[name], female   DOB: December 28, 1945, 69 y.o.   MRN: [id number] Drain out, c/o icisional pain, no weakness  Dc in am

## 2015-10-18 DIAGNOSIS — M7918 Myalgia, other site: Secondary | ICD-10-CM

## 2015-10-18 DIAGNOSIS — Z9889 Other specified postprocedural states: Secondary | ICD-10-CM

## 2015-10-18 DIAGNOSIS — I10 Essential (primary) hypertension: Secondary | ICD-10-CM | POA: Diagnosis not present

## 2015-10-18 DIAGNOSIS — I2510 Atherosclerotic heart disease of native coronary artery without angina pectoris: Secondary | ICD-10-CM | POA: Diagnosis not present

## 2015-10-18 DIAGNOSIS — G4733 Obstructive sleep apnea (adult) (pediatric): Secondary | ICD-10-CM | POA: Diagnosis not present

## 2015-10-18 DIAGNOSIS — M791 Myalgia: Secondary | ICD-10-CM

## 2015-10-18 DIAGNOSIS — K219 Gastro-esophageal reflux disease without esophagitis: Secondary | ICD-10-CM | POA: Diagnosis not present

## 2015-10-18 DIAGNOSIS — M50121 Cervical disc disorder at C4-C5 level with radiculopathy: Secondary | ICD-10-CM | POA: Diagnosis not present

## 2015-10-18 DIAGNOSIS — M6748 Ganglion, other site: Secondary | ICD-10-CM | POA: Diagnosis not present

## 2015-10-18 DIAGNOSIS — I25119 Atherosclerotic heart disease of native coronary artery with unspecified angina pectoris: Secondary | ICD-10-CM | POA: Diagnosis not present

## 2015-10-18 DIAGNOSIS — M1990 Unspecified osteoarthritis, unspecified site: Secondary | ICD-10-CM | POA: Diagnosis not present

## 2015-10-18 DIAGNOSIS — I739 Peripheral vascular disease, unspecified: Secondary | ICD-10-CM | POA: Diagnosis not present

## 2015-10-18 DIAGNOSIS — E785 Hyperlipidemia, unspecified: Secondary | ICD-10-CM | POA: Diagnosis not present

## 2015-10-18 DIAGNOSIS — D492 Neoplasm of unspecified behavior of bone, soft tissue, and skin: Secondary | ICD-10-CM | POA: Diagnosis not present

## 2015-10-18 MED ORDER — METOPROLOL TARTRATE 5 MG/5ML IV SOLN: 2.5000 mg | Freq: Four times a day (QID) | INTRAVENOUS | Status: DC | PRN

## 2015-10-18 MED ORDER — HYDRALAZINE HCL 20 MG/ML IJ SOLN
5.0000 mg | INTRAMUSCULAR | Status: DC | PRN
  Administered 2015-10-18 – 2015-10-20 (×4): 10 mg via INTRAVENOUS
  Filled 2015-10-18 (×4): qty 1

## 2015-10-18 NOTE — Progress Notes (Signed)
Dr Joya Salm at bedside, aware of elevated BP.

## 2015-10-18 NOTE — Progress Notes (Signed)
[name], female   DOB: 1946/04/26, 70 y.o.   MRN: [id number] Still c/o nausea,pain. No weakness. bp systolic over [id number]   hospitalist to see

## 2015-10-18 NOTE — Care Management Note (Signed)
Case Management Note  Patient Details  Name: DRUSILLA BAUMEISTER MRN: [id number] Date of Birth: Nov 03, 1945  Subjective/Objective:                    Action/Plan: Pt with increased BP and [facility] hospitalist consulted. CM following for d/c needs.   Expected Discharge Date:                  Expected Discharge Plan:     In-House Referral:     Discharge planning Services     Post Acute Care Choice:    Choice offered to:     DME Arranged:    DME Agency:     HH Arranged:    HH Agency:     Status of Service:  In process, will continue to follow  Medicare Important Message Given:  Yes Date Medicare IM Given:    Medicare IM give by:    Date Additional Medicare IM Given:    Additional Medicare Important Message give by:     If discussed at [place] of Stay Meetings, dates discussed:    Additional Comments:  Pollie Friar, RN 10/18/2015, 1:42 PM

## 2015-10-18 NOTE — Progress Notes (Signed)
BP still elevated. Pt c/o of some left arm pain. rn called neuro OR and confirmed with Dr Joya Salm that he has called for the hospitalist consult.

## 2015-10-18 NOTE — Consult Note (Signed)
Medical Consultation   Lindsey Bentley  KXF:[id number]  DOB: 07/31/1945  DOA: 10/15/2015  PCP: Einar Pheasant, MD   Outpatient Specialists: General surgery. Neurosurgery   Requesting physician: Dr. Joya Salm  Reason for consultation: Hypertension   History of Present Illness: Lindsey Bentley is an 70 y.o. female with past medical history significant for depression, GERD, HLD, HTN, PVD, breast cancer, CAD, hyperglycemia, thyroid dysfunction who presented to [facility] for elective spinal surgery for removal of spinal mass. Patient is postop day 2 left cervical 4 through fifth hemilaminectomy and removal of calcified mass. Doing well from a surgical staindpoint. Patient's blood pressure has remained elevated throughout hospitalization despite proper management on home blood pressure medications. Patient denies any chest pain, shortness of breath, palpitations, cough, nausea, vomiting, neck stiffness or headache outside of what would be typically seen postoperatively. Patient was given a cecal dose of Ativan to help assist with any potential underlying anxiety or muscle tension which would also be causing an increase in patient's blood pressure. Per patient this made her symptoms worse as she did not feel well on the Ativan. Patient has been on her antihypertensive medications for a long time without recent changes in doses.  Review of Systems:  ROS As per HPI otherwise 10 point review of systems negative.    Past Medical History: Past Medical History  Diagnosis Date  . Depression   . History of chicken pox   . Glaucoma   . GERD (gastroesophageal reflux disease)   . Hyperlipidemia   . Hypertension   . Peripheral vascular disease ([place])   . Diffuse cystic mastopathy 2013  . High cholesterol   . Pneumonia March 2014  . Cancer (HCC)     Breast cancer - TI, NO, MO - IDC, ER/PR pos, Her 2 neg  . Wears glasses   . Sleep apnea     wears CPAP set at 2.5  . Family  history of adverse reaction to anesthesia     Pt stated that son had a seizure with a combination of anesthesia and pain medication."  . Arthritis   . 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 Coronary artery disease   . Hyperglycemia   . Elevated TSH     Past Surgical History: Past Surgical History  Procedure Laterality Date  . Cholecystectomy  2006  . Leg stent  2011  . Back surgery  1986    ruptured disc  . Dilation and curettage of uterus    . Colonoscopy  2010    Dr. Jamal Collin  . Mole removal  2013    15 removed  . Breast biopsy  2008  . Breast biopsy Left 08-20-14  . Breast surgery Left 09-05-14    left breast lumpectomy with SLN biopsy, ER/PR positive. HER2 negative.  [name] Posterior cervical laminectomy Left 10/15/2015    Procedure: Left Cervical four- five Hemilaminectomy/Remove mass;  Surgeon: Leeroy Cha, MD;  Location: [facility];  Service: Neurosurgery;  Laterality: Left;  Left C4-5 Hemilaminectomy/Remove mass     Allergies:   Allergies  Allergen Reactions  . Codeine Nausea And Vomiting  . Adhesive [Tape] Itching and Rash    Please use "paper" tape  . Prednisone Swelling  . Statins Other (See Comments)    Muscle Pain, can take Crestor     Social History:  reports that she has never smoked. She  has never used smokeless tobacco. She reports that she does not drink alcohol or use illicit drugs.   Family History: Family History  Problem Relation Age of Onset  . Cancer Father     Prostate  . Hyperlipidemia Other     Parent  . Miscarriages / Stillbirths Other     Parent  . Hypertension Other     parent  . Heart disease Other     Parent  . Breast cancer Maternal Aunt     Physical Exam: Filed Vitals:   10/18/15 1058 10/18/15 1256 10/18/15 1521 10/18/15 1529  BP: 202/93 201/92 215/91 197/75  Pulse:  72 69   Temp:  98.1 F (36.7 C)    TempSrc:  Oral    Resp:  18 17   Height:      Weight:      SpO2:  98% 99%      Constitutional: Appearance,  Alert and awake, oriented x3, not in any acute distress. Eyes: PERLA, EOMI, irises appear normal, anicteric sclera,  ENMT: external ears and nose appear normal, normal hearing or hard of hearing CVS: S1-S2 clear, no murmur rubs or gallops, no LE edema, normal pedal pulses  Respiratory:  clear to auscultation bilaterally, no wheezing, rales or rhonchi. Respiratory effort normal. No accessory muscle use.  Abdomen: soft nontender, nondistended, normal bowel sounds, no hepatosplenomegaly, no hernias  Musculoskeletal: : no cyanosis, clubbing or edema noted bilaterally Neuro: Cranial nerves II-XII intact, strength, sensation, reflexes Psych: judgement and insight appear normal, stable mood and affect, mental status Skin: no rashes or lesions or ulcers, no induration or nodules   Data reviewed:  I have personally reviewed following labs and imaging studies Labs:  CBC: No results for input(s): WBC, NEUTROABS, HGB, HCT, MCV, PLT in the last 168 hours.  Basic Metabolic Panel: No results for input(s): NA, K, CL, CO2, GLUCOSE, BUN, CREATININE, CALCIUM, MG, PHOS in the last 168 hours. GFR Estimated Creatinine Clearance: 62 mL/min (by C-G formula based on Cr of 0.95). Liver Function Tests: No results for input(s): AST, ALT, ALKPHOS, BILITOT, PROT, ALBUMIN in the last 168 hours. No results for input(s): LIPASE, AMYLASE in the last 168 hours. No results for input(s): AMMONIA in the last 168 hours. Coagulation profile No results for input(s): INR, PROTIME in the last 168 hours.  Cardiac Enzymes: No results for input(s): CKTOTAL, CKMB, CKMBINDEX, TROPONINI in the last 168 hours. BNP: Invalid input(s): POCBNP CBG: No results for input(s): GLUCAP in the last 168 hours. D-Dimer No results for input(s): DDIMER in the last 72 hours. Hgb A1c No results for input(s): HGBA1C in the last 72 hours. Lipid Profile No results for input(s): CHOL, HDL, LDLCALC, TRIG, CHOLHDL,  LDLDIRECT in the last 72 hours. Thyroid function studies No results for input(s): TSH, T4TOTAL, T3FREE, THYROIDAB in the last 72 hours.  Invalid input(s): FREET3 Anemia work up No results for input(s): VITAMINB12, FOLATE, FERRITIN, TIBC, IRON, RETICCTPCT in the last 72 hours. Urinalysis    Component Value Date/Time   COLORURINE Straw 10/23/2013 1712   APPEARANCEUR Clear 10/23/2013 1712   LABSPEC 1.006 10/23/2013 1712   PHURINE 6.0 10/23/2013 1712   GLUCOSEU Negative 10/23/2013 1712   HGBUR 1+ 10/23/2013 1712   BILIRUBINUR Negative 10/23/2013 1712   KETONESUR Negative 10/23/2013 1712   PROTEINUR Negative 10/23/2013 1712   NITRITE Negative 10/23/2013 1712   LEUKOCYTESUR Negative 10/23/2013 1712     Microbiology Recent Results (from the past 240 hour(s))  Surgical pcr screen  Status: None   Collection Time: 10/10/15  3:11 PM  Result Value Ref Range Status   MRSA, PCR NEGATIVE NEGATIVE Final   Staphylococcus aureus NEGATIVE NEGATIVE Final    Comment:        The Xpert SA Assay (FDA approved for NASAL specimens in patients over 64 years of age), is one component of a comprehensive surveillance program.  Test performance has been validated by [facility] for patients greater than or equal to 41 year old. It is not intended to diagnose infection nor to guide or monitor treatment.        Inpatient Medications:   Scheduled Meds: . aspirin  325 mg Oral Daily  . carvedilol  3.125 mg Oral BID WC  . losartan  100 mg Oral Daily   And  . hydrochlorothiazide  25 mg Oral Daily  . letrozole  2.5 mg Oral Daily  . senna  1 tablet Oral BID  . sodium chloride flush  3 mL Intravenous Q12H   Continuous Infusions: . sodium chloride    . sodium chloride    . lactated ringers 10 mL/hr at 10/15/15 0731     Radiological Exams on Admission: No results found.  Impression/Recommendations Active Problems:   Hypertension   CAD (coronary artery disease)   Cervical spine  tumor   Musculoskeletal pain   S/P spinal surgery  Hypertension: Suspect elevation is enlarged part due to patient's pain and anxiety. Agree with continuation of patient's home regimen of Hyzaar and will increase her carvedilol to 6.25 mg twice a day. In the interim we will add hydralazine and Lopressor for blood pressure management greater than systolic 377 or diastolic 90. At time of discharge out of recommend giving patient a prescription for carvedilol 6.25 twice a day which she may use if blood pressure remains elevated at home, otherwise she can return to her dose of 3.125 mg twice a day. In addition will order an EKG to ensure no evidence of cardiac change since previous before surgery (h/o CAD, HLD, HTN but no complaints of CP). Additionally we will add Robaxin for additional musculoskeletal relief as this may be contributing to patient's overall symptomatology. SCDs in place but consider CTA for PE if not improving.   Thank you for this consultation.  Our [facility] hospitalist team will follow the patient with you.   MERRELL, DAVID J M.D. [facility] 10/18/2015, 3:37 PM

## 2015-10-18 NOTE — Progress Notes (Signed)
PT Cancellation Note  Patient Details Name: Lindsey Bentley MRN: [id number] DOB: 1945-06-25   Cancelled Treatment:    Reason Eval/Treat Not Completed: Medical issues which prohibited therapy; patient reports feeling nauseated due to Ativan.  Also reports BP is still elevated. Will defer tx at this time and check back as time allows.    Reginia Naas 10/18/2015, 10:40 AM  Magda Kiel, PT [phone] 10/18/2015

## 2015-10-18 NOTE — Progress Notes (Signed)
Patient had a  B/P of 205/99 HR 74 at 0530. , Medicated for pain and  rechecked  B/P and now  Is 184/75. Neuro surgery on call  Dr Vertell Limber notified . Gave order to give AM   B/p medication now pt pain level now  is: 4/10 on pain scale. Will check  B/p  Again after 1 hr. No further c/o voiced by pt.

## 2015-10-19 DIAGNOSIS — D492 Neoplasm of unspecified behavior of bone, soft tissue, and skin: Secondary | ICD-10-CM

## 2015-10-19 DIAGNOSIS — E785 Hyperlipidemia, unspecified: Secondary | ICD-10-CM | POA: Diagnosis not present

## 2015-10-19 DIAGNOSIS — G4733 Obstructive sleep apnea (adult) (pediatric): Secondary | ICD-10-CM | POA: Diagnosis not present

## 2015-10-19 DIAGNOSIS — I739 Peripheral vascular disease, unspecified: Secondary | ICD-10-CM | POA: Diagnosis not present

## 2015-10-19 DIAGNOSIS — M50121 Cervical disc disorder at C4-C5 level with radiculopathy: Secondary | ICD-10-CM | POA: Diagnosis not present

## 2015-10-19 DIAGNOSIS — I10 Essential (primary) hypertension: Secondary | ICD-10-CM

## 2015-10-19 DIAGNOSIS — I2510 Atherosclerotic heart disease of native coronary artery without angina pectoris: Secondary | ICD-10-CM | POA: Diagnosis not present

## 2015-10-19 DIAGNOSIS — K219 Gastro-esophageal reflux disease without esophagitis: Secondary | ICD-10-CM | POA: Diagnosis not present

## 2015-10-19 DIAGNOSIS — M1990 Unspecified osteoarthritis, unspecified site: Secondary | ICD-10-CM | POA: Diagnosis not present

## 2015-10-19 DIAGNOSIS — M6748 Ganglion, other site: Secondary | ICD-10-CM | POA: Diagnosis not present

## 2015-10-19 LAB — BASIC METABOLIC PANEL
ANION GAP: 9 (ref 5–15)
BUN: 21 mg/dL — ABNORMAL HIGH (ref 6–20)
CALCIUM: 9.1 mg/dL (ref 8.9–10.3)
CO2: 26 mmol/L (ref 22–32)
Chloride: 101 mmol/L (ref 101–111)
Creatinine, Ser: 0.96 mg/dL (ref 0.44–1.00)
GFR, EST NON AFRICAN AMERICAN: 58 mL/min — AB (ref >60)
Glucose, Bld: 102 mg/dL — ABNORMAL HIGH (ref 65–99)
POTASSIUM: 3.8 mmol/L (ref 3.5–5.1)
Sodium: 136 mmol/L (ref 135–145)

## 2015-10-19 MED ORDER — FLEET ENEMA 7-19 GM/118ML RE ENEM: 1.0000 | ENEMA | Freq: Every day | RECTAL | Status: DC | PRN

## 2015-10-19 MED ORDER — BISACODYL 10 MG RE SUPP
10.0000 mg | Freq: Once | RECTAL | Status: AC
  Administered 2015-10-19: 10 mg via RECTAL
  Filled 2015-10-19: qty 1

## 2015-10-19 MED ORDER — HYDRALAZINE HCL 50 MG PO TABS
50.0000 mg | ORAL_TABLET | Freq: Three times a day (TID) | ORAL | Status: DC
  Administered 2015-10-19 (×2): 50 mg via ORAL
  Filled 2015-10-19 (×2): qty 1

## 2015-10-19 MED ORDER — HYDRALAZINE HCL 50 MG PO TABS
75.0000 mg | ORAL_TABLET | Freq: Three times a day (TID) | ORAL | Status: DC
  Administered 2015-10-19 – 2015-10-21 (×6): 75 mg via ORAL
  Filled 2015-10-19 (×6): qty 1

## 2015-10-19 MED ORDER — ONDANSETRON HCL 4 MG PO TABS
4.0000 mg | ORAL_TABLET | ORAL | Status: DC | PRN
  Administered 2015-10-19 – 2015-10-21 (×5): 4 mg via ORAL
  Filled 2015-10-19 (×5): qty 1

## 2015-10-19 MED ORDER — ROSUVASTATIN CALCIUM 5 MG PO TABS
10.0000 mg | ORAL_TABLET | Freq: Every day | ORAL | Status: DC
  Administered 2015-10-19 – 2015-10-20 (×2): 10 mg via ORAL
  Filled 2015-10-19 (×2): qty 2

## 2015-10-19 NOTE — Progress Notes (Signed)
Physical Therapy Treatment Patient Details Name: Lindsey Bentley MRN: [id number] DOB: [birth year] Today's Date: 10/19/2015    History of Present Illness Pt with c/o neck pain and altered sensation in face and shoulder. On 6/6 pt s/p L C4-5 hemilaminectomy for removal of calcified mass.    PT Comments    Patient initially with low pain, feeling better generally, but pain and nausea increased with activity, ultimately returned to bed.  Bed mobility and transfers improved from prior treatment session, Minimal assist overall, but constant assist for safety.  Continue per plan.  Follow Up Recommendations  Home health PT;Supervision/Assistance - 24 hour     Equipment Recommendations  Rolling walker with 5" wheels    Recommendations for Other Services       Precautions / Restrictions Precautions Precautions: Cervical Precaution Comments: Reviewed precautions Required Braces or Orthoses: Cervical Brace Cervical Brace: Soft collar Restrictions Weight Bearing Restrictions: No    Mobility  Bed Mobility Overal bed mobility: Needs Assistance Bed Mobility: Sit to Sidelying;Rolling Rolling: Supervision Sidelying to sit: Min assist;Min guard       General bed mobility comments: cues for positioning, technique  Transfers Overall transfer level: Needs assistance Equipment used: None Transfers: Sit to/from Stand Sit to Stand: Supervision         General transfer comment: cue to push from arms of chair, minimize use of arms as able  Ambulation/Gait Ambulation/Gait assistance: Supervision Ambulation Distance (Feet): 100 Feet Assistive device: Rolling walker (2 wheeled) Gait Pattern/deviations: Decreased stride length Gait velocity: slow Gait velocity interpretation: Below normal speed for age/gender General Gait Details: used walker for stability; able to walk in room without walker, c/o increased pain in neck with prolonged standing   Stairs            Wheelchair  Mobility    Modified Rankin (Stroke Patients Only)       Balance     Sitting balance-Leahy Scale: Good       Standing balance-Leahy Scale: Good                      Cognition Arousal/Alertness: Awake/alert Behavior During Therapy: WFL for tasks assessed/performed Overall Cognitive Status: Within Functional Limits for tasks assessed                      Exercises      General Comments General comments (skin integrity, edema, etc.): pain and nausea increased with activity, BP after session 156/62 supine      Pertinent Vitals/Pain Pain Assessment: 0-10 Pain Score: 5  Pain Location: Neck at incision Pain Descriptors / Indicators: Pressure Pain Intervention(s): Limited activity within patient's tolerance;Monitored during session;Patient requesting pain meds-RN notified    Home Living Family/patient expects to be discharged to:: Private residence Living Arrangements: Spouse/significant other Available Help at Discharge: Family;Available PRN/intermittently (daughter will be with pt close to 24/7 initially) Type of Home: House Home Access: Stairs to enter Entrance Stairs-Rails: Left Home Layout: One level Home Equipment: None Additional Comments: pt's spouse works but daughter lives 3 blocks down    Prior Function Level of Independence: Independent          PT Goals (current goals can now be found in the care plan section) Acute Rehab PT Goals Patient Stated Goal: control nausea and pressure PT Goal Formulation: With patient Time For Goal Achievement: 10/23/15 Potential to Achieve Goals: Good Progress towards PT goals: Progressing toward goals    Frequency  Min 5X/week  PT Plan Current plan remains appropriate    Co-evaluation             End of Session Equipment Utilized During Treatment: Gait belt;Cervical collar Activity Tolerance: Patient limited by pain Patient left: in bed;with call bell/phone within reach;with family/visitor  present     Time: 1020-1040 PT Time Calculation (min) (ACUTE ONLY): 20 min  Charges:  $Gait Training: 8-22 mins                    G Codes:      Lindsey Bentley L 11-08-15, 11:32 AM

## 2015-10-19 NOTE — Progress Notes (Signed)
RT NOTE:  When I initially arrived to prepare patient for CPAP she stated that she had been sleeping well with just a [place]. I explained the MD had put in a order wanting her to use CPAP tonight and she refused and stated she was afraid the mask strap would make her neck hurt. I explained we would try to make it as comfortable as possible. She kindly refused and asked to wear only [place]. She was also worried about the soft neck collar cause the mask to not be comfortable. RT placed patient on 2L [place].  RN called a short time after and stated the MD did not want to patient to wear [place] while sleeping. MD wanted to patient to wear CPAP instead in efforts to DC home. RT told RN that she would speak to patient again but could not force patient if she continued to refuse.  RT spoke to patient again and after many time telling the patient that we would adjust the mask in efforts to not cause neck pain. Pt set up on CPAP 5cmH2O, No O2, FFM, humidity chamber filled. Mask was adjusted and soft neck collar was put back on per patient request. Pt seems concerned that the mask straps around the base of her head will cause pain for her. I instructed patient to please call out for RN if this occurs, also explained we are attempting this in order to get her home. Pt seemed to be resting comfortably when RT left. RT will monitor.

## 2015-10-19 NOTE — Progress Notes (Addendum)
rn checked on pt. Pt stating she is getting discouraged because of pain and nausea persisting. Potentially some mild depression feelings going on (not wanting family to come visit while she is in hospital etc). pt says that one of the side effects of her maintenance breast cancer medication is depression. rn encouraged pt to at least talk to someone about those feelings. Pt says she will talk to her pastor. Daughter says they will call pastor later today.   rn also gave pt a heat pack to apply to neck to see if this would help with the pain.

## 2015-10-19 NOTE — Progress Notes (Signed)
[name], female   DOB: 12/13/1945, 70 y.o.   MRN: [id number] Still with elevated systolic. No weakness. Seen by hospitalists. Wants to wait till am to go home

## 2015-10-19 NOTE — Progress Notes (Signed)
OT Cancellation Note  Patient Details Name: Lindsey Bentley MRN: [id number] DOB: May 23, 1945   Cancelled Treatment:    Reason Eval/Treat Not Completed:  (Pt had just gotten settled in bed recently and did not want to get up with OT.)  Benito Mccreedy OTR/L [id number] 10/19/2015, 1:49 PM

## 2015-10-19 NOTE — Progress Notes (Signed)
I was told in report tonight 6/10 at 1900 by [facility], that Dr. Did not want the patient sleeping with nasal canula when she uses a CPAP at home.  Respiratory had already spoken with patient and started her on 2L nasal canula per patient request.  Spoke with Maudie Mercury RT and she went back in to speak with patient.  Patient agreed she would at least try the sleep mask; however, RT was not sure if mask would make a good seal with the soft collar in place.  Explained to RT that soft collar was order for comfort, PRN.Marland Kitchenand patient could remove it if it was causing interference in care. Will follow up

## 2015-10-19 NOTE — Progress Notes (Signed)
CPAP machine ordered, RT will deliver before pt goes to bed tonight.

## 2015-10-19 NOTE — Progress Notes (Signed)
rn has offered twice now to walk with pt. Pt both times refused. At present pt reports increased nausea. Given zofran.

## 2015-10-19 NOTE — Progress Notes (Signed)
Hourly rounding performed. Call light within reach. Pt in no acute distress. Denies needs. Pt has not vomited in the last 2 days. Pt has been able to keep food down. Today pt eating small amounts of food, toast and jello. Has been taking zofran on a regular basis.

## 2015-10-19 NOTE — Progress Notes (Signed)
PROGRESS NOTE                                                                                                                                                                                                             Patient Demographics:    Lindsey Bentley, is a 70 y.o. female, DOB - January 10, 1946, [id number]  Admit date - 10/15/2015   Admitting Physician Leeroy Cha, MD  Outpatient Primary MD for the patient is Einar Pheasant, MD  LOS - 4  No chief complaint on file.      Brief Narrative  70 y.o. female with past medical history significant for depression, GERD, HLD, HTN, PVD, breast cancer, CAD, hyperglycemia, thyroid dysfunction who presented to [facility] for elective spinal surgery for removal of spinal mass. Patient S/P left cervical 4 through fifth hemilaminectomy and removal of calcified mass, Pathology significant for calcified ganglion cyst, hospitalist were consulted for uncontrolled blood pressure.   Subjective:    Lindsey Bentley today has, No headache, No chest pain, No abdominal pain - No Nausea, No new weakness tingling or numbness0   Assessment  & Plan :    Active Problems:   Hypertension   CAD (coronary artery disease)   Cervical spine tumor   Musculoskeletal pain   S/P spinal surgery  Hypertension - Blood pressure usually controlled as an outpatient, currently during hospital stay, most likely related to anxiety and pain, continue with Hyzaar, will continue with Coreg at current dose with no increase given heart rate in 60s and 70s, blood pressure is significantly elevated this a.m., so we'll start on hydralazine 50 mg 3 times a day.  OSA - We'll resume CPAP  hemilaminectomy and removal of calcified mass - Pathology showing calcified ganglion cells, further management per neurosurgery    Lab Results  Component Value Date   PLT 208 10/10/2015    Antibiotics  :    Anti-infectives    Start     Dose/Rate  Route Frequency Ordered Stop   10/15/15 1700  ceFAZolin (ANCEF) IVPB 1 g/50 mL premix     1 g 100 mL/hr over 30 Minutes Intravenous Every 8 hours 10/15/15 1349 10/16/15 0140   10/15/15 1106  vancomycin (VANCOCIN) powder  Status:  Discontinued       As needed 10/15/15 1106 10/15/15 1138   10/15/15 1104  vancomycin (VANCOCIN) 1000 MG powder    Comments:  Carroll Kinds   : cabinet override      10/15/15 1104 10/15/15 2314   10/15/15 0600  ceFAZolin (ANCEF) IVPB 2g/100 mL premix     2 g 200 mL/hr over 30 Minutes Intravenous To [facility] 10/14/15 1221 10/15/15 0929        Objective:   Filed Vitals:   10/19/15 0134 10/19/15 0605 10/19/15 0821 10/19/15 0916  BP: 144/55 212/94 186/73 150/63  Pulse: 68 66 75 70  Temp: 98.1 F (36.7 C) 97.9 F (36.6 C) 98.1 F (36.7 C) 97.8 F (36.6 C)  TempSrc: Oral Oral Oral Oral  Resp: 16 16 16 18   Height:      Weight:      SpO2: 99% 98% 97% 97%    Wt Readings from Last 3 Encounters:  10/16/15 98.8 kg (217 lb 13 oz)  10/10/15 90.855 kg (200 lb 4.8 oz)  10/09/15 91.627 kg (202 lb)    No intake or output data in the 24 hours ending 10/19/15 1150   Physical Exam  Awake Alert, Oriented X 3. Supple Neck,No JVD, Soft cervical collar Symmetrical Chest wall movement, Good air movement bilaterally, CTAB RRR,No Gallops,Rubs or new Murmurs, No Parasternal Heave +ve B.Sounds, Abd Soft, No tenderness, No rebound - guarding or rigidity. No Cyanosis, Clubbing or edema, No new Rash or bruise    Data Review:    CBC No results for input(s): WBC, HGB, HCT, PLT, MCV, MCH, MCHC, RDW, LYMPHSABS, MONOABS, EOSABS, BASOSABS, BANDABS in the last 168 hours.  Invalid input(s): NEUTRABS, BANDSABD  Chemistries   Recent Labs Lab 10/19/15 0824  NA 136  K 3.8  CL 101  CO2 26  GLUCOSE 102*  BUN 21*  CREATININE 0.96  CALCIUM 9.1    ------------------------------------------------------------------------------------------------------------------ No results for input(s): CHOL, HDL, LDLCALC, TRIG, CHOLHDL, LDLDIRECT in the last 72 hours.  Lab Results  Component Value Date   HGBA1C 6.6* 10/04/2015   ------------------------------------------------------------------------------------------------------------------ No results for input(s): TSH, T4TOTAL, T3FREE, THYROIDAB in the last 72 hours.  Invalid input(s): FREET3 ------------------------------------------------------------------------------------------------------------------ No results for input(s): VITAMINB12, FOLATE, FERRITIN, TIBC, IRON, RETICCTPCT in the last 72 hours.  Coagulation profile No results for input(s): INR, PROTIME in the last 168 hours.  No results for input(s): DDIMER in the last 72 hours.  Cardiac Enzymes No results for input(s): CKMB, TROPONINI, MYOGLOBIN in the last 168 hours.  Invalid input(s): CK ------------------------------------------------------------------------------------------------------------------ No results found for: BNP  Inpatient Medications  Scheduled Meds: . aspirin  325 mg Oral Daily  . carvedilol  3.125 mg Oral BID WC  . hydrALAZINE  50 mg Oral Q8H  . losartan  100 mg Oral Daily   And  . hydrochlorothiazide  25 mg Oral Daily  . letrozole  2.5 mg Oral Daily  . senna  1 tablet Oral BID  . sodium chloride flush  3 mL Intravenous Q12H   Continuous Infusions: . sodium chloride    . lactated ringers 10 mL/hr at 10/15/15 0731   PRN Meds:.acetaminophen **OR** acetaminophen, hydrALAZINE, menthol-cetylpyridinium **OR** phenol, metoprolol, morphine injection, ondansetron (ZOFRAN) IV, promethazine, sodium chloride flush, sodium phosphate, traMADol  Micro Results Recent Results (from the past 240 hour(s))  Surgical pcr screen     Status: None   Collection Time: 10/10/15  3:11 PM  Result Value Ref Range Status    MRSA, PCR NEGATIVE NEGATIVE Final   Staphylococcus aureus NEGATIVE NEGATIVE Final    Comment:  The Xpert SA Assay (FDA approved for NASAL specimens in patients over 27 years of age), is one component of a comprehensive surveillance program.  Test performance has been validated by [facility] for patients greater than or equal to 38 year old. It is not intended to diagnose infection nor to guide or monitor treatment.     Radiology Reports Dg Cervical Spine 2-3 Views  10/15/2015  CLINICAL DATA:  Left C4-5 hemilaminectomy and removal of mass. EXAM: CERVICAL SPINE - 2-3 VIEW; DG C-ARM 61-120 MIN COMPARISON:  09/26/2015 cervical myelogram. FINDINGS: Fluoroscopy time 7 seconds. Spot fluoroscopic nondiagnostic intraoperative cervical spine radiographs demonstrates a posterior approach surgical marking device terminating over the C4 spinous process. IMPRESSION: Intraoperative fluoroscopic guidance for left C4 hemilaminectomy. Electronically Signed   By: Ilona Sorrel M.D.   On: 10/15/2015 12:07   Dg Cervical Spine 2-3 Views  10/15/2015  CLINICAL DATA:  C4-C5 laminectomy and mass removal EXAM: CERVICAL SPINE - 2-3 VIEW COMPARISON:  09/26/2015 FINDINGS: Two lateral views of the cervical spine submitted. On the second film there is a posterior localization needle at C3 level, spinous process level. Facet degenerative changes are noted C3-C4 level. IMPRESSION: Posterior localization needle on the second film at C3 level. Electronically Signed   By: Lahoma Crocker M.D.   On: 10/15/2015 10:53   Ct Cervical Spine W Contrast  09/26/2015  CLINICAL DATA:  Neck pain. LEFT shoulder pain. Decreased range of motion FLUOROSCOPY TIME:  43 seconds corresponding to a Dose Area Product of 125 Gy*m2 PROCEDURE: LUMBAR PUNCTURE FOR CERVICAL MYELOGRAM After thorough discussion of risks and benefits of the procedure including bleeding, infection, injury to nerves, blood vessels, adjacent structures as well as headache and  CSF leak, written and oral informed consent was obtained. Consent was obtained by Dr. Rolla Flatten. We discussed the high likelihood of obtaining a diagnostic study. Patient was positioned prone on the fluoroscopy table. Local anesthesia was provided with 1% lidocaine without epinephrine after prepped and draped in the usual sterile fashion. Puncture was performed at L3-L4 using a 3 1/2 inch 22-gauge spinal needle via RIGHT paramedian approach. Using a single pass through the dura, the needle was placed within the thecal sac, with return of clear CSF. 10 mL of Isovue-M 300 was injected into the thecal sac, with normal opacification of the nerve roots and cauda equina consistent with free flow within the subarachnoid space. The patient was then moved to the trendelenburg position and contrast flowed into the Cervical spine region. I personally performed the lumbar puncture and administered the intrathecal contrast. I also personally supervised acquisition of the myelogram images. TECHNIQUE: Contiguous axial images were obtained through the Cervical spine after the intrathecal infusion of infusion. Coronal and sagittal reconstructions were obtained of the axial image sets. FINDINGS: CERVICAL MYELOGRAM FINDINGS: Good opacification cervical subarachnoid space. Severe disc space narrowing at C5-6 and C6-7, with effacement of the exiting C6 and C7 nerve roots, LEFT greater than RIGHT, at those levels respectively. There is also a large asymmetric extradural defect at C4-5 on the LEFT related to posterior element hypertrophy. Upright Lateral flexion extension views demonstrate 1 mm anterolisthesis at C3-4 and 2 mm anterolisthesis C4-5 which are facet mediated, slightly worse in flexion, reducing to anatomic alignment and extension. No significant movement at C5-6 or C6-7. CT CERVICAL MYELOGRAM FINDINGS: Alignment: Trace anterolisthesis C3-4 and C4-5 facet mediated. Vertebrae: Osseous spurring, but no destructive lesion.  Cord: Multilevel stenosis with displacement and flattening, described below. Posterior Fossa: No tonsillar herniation.  Vertebral Arteries: Not assessed. Paraspinal tissues: Unremarkable. Lung apices clear. Atherosclerosis. Disc levels: The individual disc spaces were examined as follows: C2-3: BILATERAL facet arthropathy. Spontaneous Posterior facet arthrodesis on the LEFT. Slight osseous spurring. Slight LEFT foraminal narrowing does not clearly affect the C3 nerve root. C3-4: Trace anterolisthesis is facet mediated. Severe BILATERAL facet arthropathy. Uncinate spurring worse on the LEFT. LEFT greater than RIGHT C4 nerve root impingement. C4-5: Severe BILATERAL facet arthropathy with ossified facet spur eccentric to the LEFT, identified on prior neck CT. This results in a 9 x 10 mm extradural mass displacing the cord from LEFT to RIGHT. Severe LEFT C5 nerve root impingement. C5-6: Advanced facet arthropathy. Medial facet ossification also extends into the epidural space on the LEFT, not as severe as the level above. Disc space narrowing with uncinate spurring and disc osteophyte complex. LEFT greater than RIGHT C6 nerve root impingement. Mild cord flattening. Canal diameter 7-8 mm. C6-7: Severe disc space narrowing. Disc osteophyte complex extends more to the LEFT. LEFT greater than RIGHT C7 nerve root impingement. Mild cord flattening. Canal diameter C7-T1:  Unremarkable. IMPRESSION: Multilevel spondylosis as described, with severe LEFT-sided neural impingement at C4-5 and C5-6 due to medially projecting and ossific facet arthropathy and superimposed disc pathology. Disc space narrowing with osseous spurring results in LEFT greater than RIGHT foraminal narrowing at C6-7. Slight facet mediated anterolisthesis at C3-4 and C4-5 does not demonstrate significant dynamic instability. Electronically Signed   By: Staci Righter M.D.   On: 09/26/2015 12:45   Dg C-arm 1-60 Min  10/15/2015  CLINICAL DATA:  Left C4-5  hemilaminectomy and removal of mass. EXAM: CERVICAL SPINE - 2-3 VIEW; DG C-ARM 61-120 MIN COMPARISON:  09/26/2015 cervical myelogram. FINDINGS: Fluoroscopy time 7 seconds. Spot fluoroscopic nondiagnostic intraoperative cervical spine radiographs demonstrates a posterior approach surgical marking device terminating over the C4 spinous process. IMPRESSION: Intraoperative fluoroscopic guidance for left C4 hemilaminectomy. Electronically Signed   By: Ilona Sorrel M.D.   On: 10/15/2015 12:07   Dg Myelography Lumbar Inj Cervical  09/26/2015  CLINICAL DATA:  Neck pain. LEFT shoulder pain. Decreased range of motion FLUOROSCOPY TIME:  43 seconds corresponding to a Dose Area Product of 125 Gy*m2 PROCEDURE: LUMBAR PUNCTURE FOR CERVICAL MYELOGRAM After thorough discussion of risks and benefits of the procedure including bleeding, infection, injury to nerves, blood vessels, adjacent structures as well as headache and CSF leak, written and oral informed consent was obtained. Consent was obtained by Dr. Rolla Flatten. We discussed the high likelihood of obtaining a diagnostic study. Patient was positioned prone on the fluoroscopy table. Local anesthesia was provided with 1% lidocaine without epinephrine after prepped and draped in the usual sterile fashion. Puncture was performed at L3-L4 using a 3 1/2 inch 22-gauge spinal needle via RIGHT paramedian approach. Using a single pass through the dura, the needle was placed within the thecal sac, with return of clear CSF. 10 mL of Isovue-M 300 was injected into the thecal sac, with normal opacification of the nerve roots and cauda equina consistent with free flow within the subarachnoid space. The patient was then moved to the trendelenburg position and contrast flowed into the Cervical spine region. I personally performed the lumbar puncture and administered the intrathecal contrast. I also personally supervised acquisition of the myelogram images. TECHNIQUE: Contiguous axial images  were obtained through the Cervical spine after the intrathecal infusion of infusion. Coronal and sagittal reconstructions were obtained of the axial image sets. FINDINGS: CERVICAL MYELOGRAM FINDINGS: Good opacification cervical subarachnoid space.  Severe disc space narrowing at C5-6 and C6-7, with effacement of the exiting C6 and C7 nerve roots, LEFT greater than RIGHT, at those levels respectively. There is also a large asymmetric extradural defect at C4-5 on the LEFT related to posterior element hypertrophy. Upright Lateral flexion extension views demonstrate 1 mm anterolisthesis at C3-4 and 2 mm anterolisthesis C4-5 which are facet mediated, slightly worse in flexion, reducing to anatomic alignment and extension. No significant movement at C5-6 or C6-7. CT CERVICAL MYELOGRAM FINDINGS: Alignment: Trace anterolisthesis C3-4 and C4-5 facet mediated. Vertebrae: Osseous spurring, but no destructive lesion. Cord: Multilevel stenosis with displacement and flattening, described below. Posterior Fossa: No tonsillar herniation. Vertebral Arteries: Not assessed. Paraspinal tissues: Unremarkable. Lung apices clear. Atherosclerosis. Disc levels: The individual disc spaces were examined as follows: C2-3: BILATERAL facet arthropathy. Spontaneous Posterior facet arthrodesis on the LEFT. Slight osseous spurring. Slight LEFT foraminal narrowing does not clearly affect the C3 nerve root. C3-4: Trace anterolisthesis is facet mediated. Severe BILATERAL facet arthropathy. Uncinate spurring worse on the LEFT. LEFT greater than RIGHT C4 nerve root impingement. C4-5: Severe BILATERAL facet arthropathy with ossified facet spur eccentric to the LEFT, identified on prior neck CT. This results in a 9 x 10 mm extradural mass displacing the cord from LEFT to RIGHT. Severe LEFT C5 nerve root impingement. C5-6: Advanced facet arthropathy. Medial facet ossification also extends into the epidural space on the LEFT, not as severe as the level  above. Disc space narrowing with uncinate spurring and disc osteophyte complex. LEFT greater than RIGHT C6 nerve root impingement. Mild cord flattening. Canal diameter 7-8 mm. C6-7: Severe disc space narrowing. Disc osteophyte complex extends more to the LEFT. LEFT greater than RIGHT C7 nerve root impingement. Mild cord flattening. Canal diameter C7-T1:  Unremarkable. IMPRESSION: Multilevel spondylosis as described, with severe LEFT-sided neural impingement at C4-5 and C5-6 due to medially projecting and ossific facet arthropathy and superimposed disc pathology. Disc space narrowing with osseous spurring results in LEFT greater than RIGHT foraminal narrowing at C6-7. Slight facet mediated anterolisthesis at C3-4 and C4-5 does not demonstrate significant dynamic instability. Electronically Signed   By: Staci Righter M.D.   On: 09/26/2015 12:45    Time Spent in minutes  25 minutes    Wonder Donaway M.D on 10/19/2015 at 11:50 AM  Between 7am to 7pm - Pager - [phone]  After 7pm go to [email or web address] - password [facility]  [facility] -  Office  [phone]

## 2015-10-20 DIAGNOSIS — K219 Gastro-esophageal reflux disease without esophagitis: Secondary | ICD-10-CM | POA: Diagnosis not present

## 2015-10-20 DIAGNOSIS — M50121 Cervical disc disorder at C4-C5 level with radiculopathy: Secondary | ICD-10-CM | POA: Diagnosis not present

## 2015-10-20 DIAGNOSIS — M1990 Unspecified osteoarthritis, unspecified site: Secondary | ICD-10-CM | POA: Diagnosis not present

## 2015-10-20 DIAGNOSIS — I2510 Atherosclerotic heart disease of native coronary artery without angina pectoris: Secondary | ICD-10-CM | POA: Diagnosis not present

## 2015-10-20 DIAGNOSIS — G4733 Obstructive sleep apnea (adult) (pediatric): Secondary | ICD-10-CM | POA: Diagnosis not present

## 2015-10-20 DIAGNOSIS — D492 Neoplasm of unspecified behavior of bone, soft tissue, and skin: Secondary | ICD-10-CM | POA: Diagnosis not present

## 2015-10-20 DIAGNOSIS — E785 Hyperlipidemia, unspecified: Secondary | ICD-10-CM | POA: Diagnosis not present

## 2015-10-20 DIAGNOSIS — M6748 Ganglion, other site: Secondary | ICD-10-CM | POA: Diagnosis not present

## 2015-10-20 DIAGNOSIS — I10 Essential (primary) hypertension: Secondary | ICD-10-CM | POA: Diagnosis not present

## 2015-10-20 DIAGNOSIS — I739 Peripheral vascular disease, unspecified: Secondary | ICD-10-CM | POA: Diagnosis not present

## 2015-10-20 MED ORDER — ALPRAZOLAM 0.25 MG PO TABS
0.2500 mg | ORAL_TABLET | Freq: Two times a day (BID) | ORAL | Status: AC
  Administered 2015-10-20 (×2): 0.25 mg via ORAL
  Filled 2015-10-20 (×2): qty 1

## 2015-10-20 NOTE — Progress Notes (Signed)
[name], female   DOB: [birth year], 70 y.o.   MRN: [id number] BP 124/52 mmHg  Pulse 66  Temp(Src) 98.7 F (37.1 C) (Oral)  Resp 18  Ht 5\' 4"  (1.626 m)  Wt 98.8 kg (217 lb 13 oz)  BMI 37.37 kg/m2  SpO2 96% Alert and oriented x 4, speech is clear and fluent Moving all extremities well Wound is clean, dry, without signs of infection Would like more time with therapy, also wants to achieve better pain control.

## 2015-10-20 NOTE — Progress Notes (Signed)
PROGRESS NOTE                                                                                                                                                                                                             Patient Demographics:    Lindsey Bentley, is a 70 y.o. female, DOB - 1945-07-18, [id number]  Admit date - 10/15/2015   Admitting Physician Leeroy Cha, MD  Outpatient Primary MD for the patient is Einar Pheasant, MD  LOS - 5  No chief complaint on file.      Brief Narrative  70 y.o. female with past medical history significant for depression, GERD, HLD, HTN, PVD, breast cancer, CAD, hyperglycemia, thyroid dysfunction who presented to [facility] for elective spinal surgery for removal of spinal mass. Patient S/P left cervical 4 through fifth hemilaminectomy and removal of calcified mass, Pathology significant for calcified ganglion cyst, hospitalist were consulted for uncontrolled blood pressure.   Subjective:    Lindsey Bentley today has, No headache, No chest pain, No abdominal pain - No Nausea, No new weakness tingling or numbness0   Assessment  & Plan :    Active Problems:   Hypertension   CAD (coronary artery disease)   Cervical spine tumor   Musculoskeletal pain   S/P spinal surgery  Hypertension - Blood pressure usually controlled as an outpatient, currently Elevated during hospital stay, most likely related to anxiety and pain, continue with Hyzaar, will continue with Coreg at current dose with no increase given heart rate in 60s and 70s, blood pressure better controlled after starting hydralazine, but remains elevated, so it was increased to 75 mg 3 times a day , continue to monitor , remains labile, most likely due to anxiety, so will have a trial of low-dose Xanax today .  OSA - We'll resume CPAP  hemilaminectomy and removal of calcified mass - Pathology showing calcified ganglion cells, further management  per neurosurgery    Lab Results  Component Value Date   PLT 208 10/10/2015    Antibiotics  :    Anti-infectives    Start     Dose/Rate Route Frequency Ordered Stop   10/15/15 1700  ceFAZolin (ANCEF) IVPB 1 g/50 mL premix     1 g 100 mL/hr over 30 Minutes Intravenous Every 8 hours 10/15/15 1349 10/16/15 0140   10/15/15  1106  vancomycin (VANCOCIN) powder  Status:  Discontinued       As needed 10/15/15 1106 10/15/15 1138   10/15/15 1104  vancomycin (VANCOCIN) 1000 MG powder    Comments:  Carroll Kinds   : cabinet override      10/15/15 1104 10/15/15 2314   10/15/15 0600  ceFAZolin (ANCEF) IVPB 2g/100 mL premix     2 g 200 mL/hr over 30 Minutes Intravenous To [facility] 10/14/15 1221 10/15/15 0929        Objective:   Filed Vitals:   10/20/15 0127 10/20/15 0214 10/20/15 0530 10/20/15 1048  BP: 222/78 150/42 170/65 124/52  Pulse: 70  70 66  Temp: 98.2 F (36.8 C)  98.1 F (36.7 C) 98.7 F (37.1 C)  TempSrc: Oral  Oral Oral  Resp: 18  18 18   Height:      Weight:      SpO2: 96%  95% 96%    Wt Readings from Last 3 Encounters:  10/16/15 98.8 kg (217 lb 13 oz)  10/10/15 90.855 kg (200 lb 4.8 oz)  10/09/15 91.627 kg (202 lb)    No intake or output data in the 24 hours ending 10/20/15 1336   Physical Exam  Awake Alert, Oriented X 3. Supple Neck,No JVD, Soft cervical collar Symmetrical Chest wall movement, Good air movement bilaterally, CTAB RRR,No Gallops,Rubs or new Murmurs, No Parasternal Heave +ve B.Sounds, Abd Soft, No tenderness, No rebound - guarding or rigidity. No Cyanosis, Clubbing or edema, No new Rash or bruise    Data Review:    CBC No results for input(s): WBC, HGB, HCT, PLT, MCV, MCH, MCHC, RDW, LYMPHSABS, MONOABS, EOSABS, BASOSABS, BANDABS in the last 168 hours.  Invalid input(s): NEUTRABS, BANDSABD  Chemistries   Recent Labs Lab 10/19/15 0824  NA 136  K 3.8  CL 101  CO2 26  GLUCOSE 102*  BUN 21*  CREATININE 0.96  CALCIUM  9.1   ------------------------------------------------------------------------------------------------------------------ No results for input(s): CHOL, HDL, LDLCALC, TRIG, CHOLHDL, LDLDIRECT in the last 72 hours.  Lab Results  Component Value Date   HGBA1C 6.6* 10/04/2015   ------------------------------------------------------------------------------------------------------------------ No results for input(s): TSH, T4TOTAL, T3FREE, THYROIDAB in the last 72 hours.  Invalid input(s): FREET3 ------------------------------------------------------------------------------------------------------------------ No results for input(s): VITAMINB12, FOLATE, FERRITIN, TIBC, IRON, RETICCTPCT in the last 72 hours.  Coagulation profile No results for input(s): INR, PROTIME in the last 168 hours.  No results for input(s): DDIMER in the last 72 hours.  Cardiac Enzymes No results for input(s): CKMB, TROPONINI, MYOGLOBIN in the last 168 hours.  Invalid input(s): CK ------------------------------------------------------------------------------------------------------------------ No results found for: BNP  Inpatient Medications  Scheduled Meds: . ALPRAZolam  0.25 mg Oral BID  . aspirin  325 mg Oral Daily  . carvedilol  3.125 mg Oral BID WC  . hydrALAZINE  75 mg Oral Q8H  . losartan  100 mg Oral Daily   And  . hydrochlorothiazide  25 mg Oral Daily  . letrozole  2.5 mg Oral Daily  . rosuvastatin  10 mg Oral q1800  . senna  1 tablet Oral BID  . sodium chloride flush  3 mL Intravenous Q12H   Continuous Infusions: . sodium chloride    . lactated ringers 10 mL/hr at 10/15/15 0731   PRN Meds:.acetaminophen **OR** acetaminophen, hydrALAZINE, menthol-cetylpyridinium **OR** phenol, metoprolol, morphine injection, ondansetron, promethazine, sodium chloride flush, sodium phosphate, traMADol  Micro Results Recent Results (from the past 240 hour(s))  Surgical pcr screen     Status: None  Collection  Time: 10/10/15  3:11 PM  Result Value Ref Range Status   MRSA, PCR NEGATIVE NEGATIVE Final   Staphylococcus aureus NEGATIVE NEGATIVE Final    Comment:        The Xpert SA Assay (FDA approved for NASAL specimens in patients over 52 years of age), is one component of a comprehensive surveillance program.  Test performance has been validated by [facility] for patients greater than or equal to 22 year old. It is not intended to diagnose infection nor to guide or monitor treatment.     Radiology Reports Dg Cervical Spine 2-3 Views  10/15/2015  CLINICAL DATA:  Left C4-5 hemilaminectomy and removal of mass. EXAM: CERVICAL SPINE - 2-3 VIEW; DG C-ARM 61-120 MIN COMPARISON:  09/26/2015 cervical myelogram. FINDINGS: Fluoroscopy time 7 seconds. Spot fluoroscopic nondiagnostic intraoperative cervical spine radiographs demonstrates a posterior approach surgical marking device terminating over the C4 spinous process. IMPRESSION: Intraoperative fluoroscopic guidance for left C4 hemilaminectomy. Electronically Signed   By: Ilona Sorrel M.D.   On: 10/15/2015 12:07   Dg Cervical Spine 2-3 Views  10/15/2015  CLINICAL DATA:  C4-C5 laminectomy and mass removal EXAM: CERVICAL SPINE - 2-3 VIEW COMPARISON:  09/26/2015 FINDINGS: Two lateral views of the cervical spine submitted. On the second film there is a posterior localization needle at C3 level, spinous process level. Facet degenerative changes are noted C3-C4 level. IMPRESSION: Posterior localization needle on the second film at C3 level. Electronically Signed   By: Lahoma Crocker M.D.   On: 10/15/2015 10:53   Ct Cervical Spine W Contrast  09/26/2015  CLINICAL DATA:  Neck pain. LEFT shoulder pain. Decreased range of motion FLUOROSCOPY TIME:  43 seconds corresponding to a Dose Area Product of 125 Gy*m2 PROCEDURE: LUMBAR PUNCTURE FOR CERVICAL MYELOGRAM After thorough discussion of risks and benefits of the procedure including bleeding, infection, injury to nerves,  blood vessels, adjacent structures as well as headache and CSF leak, written and oral informed consent was obtained. Consent was obtained by Dr. Rolla Flatten. We discussed the high likelihood of obtaining a diagnostic study. Patient was positioned prone on the fluoroscopy table. Local anesthesia was provided with 1% lidocaine without epinephrine after prepped and draped in the usual sterile fashion. Puncture was performed at L3-L4 using a 3 1/2 inch 22-gauge spinal needle via RIGHT paramedian approach. Using a single pass through the dura, the needle was placed within the thecal sac, with return of clear CSF. 10 mL of Isovue-M 300 was injected into the thecal sac, with normal opacification of the nerve roots and cauda equina consistent with free flow within the subarachnoid space. The patient was then moved to the trendelenburg position and contrast flowed into the Cervical spine region. I personally performed the lumbar puncture and administered the intrathecal contrast. I also personally supervised acquisition of the myelogram images. TECHNIQUE: Contiguous axial images were obtained through the Cervical spine after the intrathecal infusion of infusion. Coronal and sagittal reconstructions were obtained of the axial image sets. FINDINGS: CERVICAL MYELOGRAM FINDINGS: Good opacification cervical subarachnoid space. Severe disc space narrowing at C5-6 and C6-7, with effacement of the exiting C6 and C7 nerve roots, LEFT greater than RIGHT, at those levels respectively. There is also a large asymmetric extradural defect at C4-5 on the LEFT related to posterior element hypertrophy. Upright Lateral flexion extension views demonstrate 1 mm anterolisthesis at C3-4 and 2 mm anterolisthesis C4-5 which are facet mediated, slightly worse in flexion, reducing to anatomic alignment and extension. No significant movement  at C5-6 or C6-7. CT CERVICAL MYELOGRAM FINDINGS: Alignment: Trace anterolisthesis C3-4 and C4-5 facet mediated.  Vertebrae: Osseous spurring, but no destructive lesion. Cord: Multilevel stenosis with displacement and flattening, described below. Posterior Fossa: No tonsillar herniation. Vertebral Arteries: Not assessed. Paraspinal tissues: Unremarkable. Lung apices clear. Atherosclerosis. Disc levels: The individual disc spaces were examined as follows: C2-3: BILATERAL facet arthropathy. Spontaneous Posterior facet arthrodesis on the LEFT. Slight osseous spurring. Slight LEFT foraminal narrowing does not clearly affect the C3 nerve root. C3-4: Trace anterolisthesis is facet mediated. Severe BILATERAL facet arthropathy. Uncinate spurring worse on the LEFT. LEFT greater than RIGHT C4 nerve root impingement. C4-5: Severe BILATERAL facet arthropathy with ossified facet spur eccentric to the LEFT, identified on prior neck CT. This results in a 9 x 10 mm extradural mass displacing the cord from LEFT to RIGHT. Severe LEFT C5 nerve root impingement. C5-6: Advanced facet arthropathy. Medial facet ossification also extends into the epidural space on the LEFT, not as severe as the level above. Disc space narrowing with uncinate spurring and disc osteophyte complex. LEFT greater than RIGHT C6 nerve root impingement. Mild cord flattening. Canal diameter 7-8 mm. C6-7: Severe disc space narrowing. Disc osteophyte complex extends more to the LEFT. LEFT greater than RIGHT C7 nerve root impingement. Mild cord flattening. Canal diameter C7-T1:  Unremarkable. IMPRESSION: Multilevel spondylosis as described, with severe LEFT-sided neural impingement at C4-5 and C5-6 due to medially projecting and ossific facet arthropathy and superimposed disc pathology. Disc space narrowing with osseous spurring results in LEFT greater than RIGHT foraminal narrowing at C6-7. Slight facet mediated anterolisthesis at C3-4 and C4-5 does not demonstrate significant dynamic instability. Electronically Signed   By: Staci Righter M.D.   On: 09/26/2015 12:45   Dg C-arm  1-60 Min  10/15/2015  CLINICAL DATA:  Left C4-5 hemilaminectomy and removal of mass. EXAM: CERVICAL SPINE - 2-3 VIEW; DG C-ARM 61-120 MIN COMPARISON:  09/26/2015 cervical myelogram. FINDINGS: Fluoroscopy time 7 seconds. Spot fluoroscopic nondiagnostic intraoperative cervical spine radiographs demonstrates a posterior approach surgical marking device terminating over the C4 spinous process. IMPRESSION: Intraoperative fluoroscopic guidance for left C4 hemilaminectomy. Electronically Signed   By: Ilona Sorrel M.D.   On: 10/15/2015 12:07   Dg Myelography Lumbar Inj Cervical  09/26/2015  CLINICAL DATA:  Neck pain. LEFT shoulder pain. Decreased range of motion FLUOROSCOPY TIME:  43 seconds corresponding to a Dose Area Product of 125 Gy*m2 PROCEDURE: LUMBAR PUNCTURE FOR CERVICAL MYELOGRAM After thorough discussion of risks and benefits of the procedure including bleeding, infection, injury to nerves, blood vessels, adjacent structures as well as headache and CSF leak, written and oral informed consent was obtained. Consent was obtained by Dr. Rolla Flatten. We discussed the high likelihood of obtaining a diagnostic study. Patient was positioned prone on the fluoroscopy table. Local anesthesia was provided with 1% lidocaine without epinephrine after prepped and draped in the usual sterile fashion. Puncture was performed at L3-L4 using a 3 1/2 inch 22-gauge spinal needle via RIGHT paramedian approach. Using a single pass through the dura, the needle was placed within the thecal sac, with return of clear CSF. 10 mL of Isovue-M 300 was injected into the thecal sac, with normal opacification of the nerve roots and cauda equina consistent with free flow within the subarachnoid space. The patient was then moved to the trendelenburg position and contrast flowed into the Cervical spine region. I personally performed the lumbar puncture and administered the intrathecal contrast. I also personally supervised acquisition of the  myelogram images. TECHNIQUE: Contiguous axial images were obtained through the Cervical spine after the intrathecal infusion of infusion. Coronal and sagittal reconstructions were obtained of the axial image sets. FINDINGS: CERVICAL MYELOGRAM FINDINGS: Good opacification cervical subarachnoid space. Severe disc space narrowing at C5-6 and C6-7, with effacement of the exiting C6 and C7 nerve roots, LEFT greater than RIGHT, at those levels respectively. There is also a large asymmetric extradural defect at C4-5 on the LEFT related to posterior element hypertrophy. Upright Lateral flexion extension views demonstrate 1 mm anterolisthesis at C3-4 and 2 mm anterolisthesis C4-5 which are facet mediated, slightly worse in flexion, reducing to anatomic alignment and extension. No significant movement at C5-6 or C6-7. CT CERVICAL MYELOGRAM FINDINGS: Alignment: Trace anterolisthesis C3-4 and C4-5 facet mediated. Vertebrae: Osseous spurring, but no destructive lesion. Cord: Multilevel stenosis with displacement and flattening, described below. Posterior Fossa: No tonsillar herniation. Vertebral Arteries: Not assessed. Paraspinal tissues: Unremarkable. Lung apices clear. Atherosclerosis. Disc levels: The individual disc spaces were examined as follows: C2-3: BILATERAL facet arthropathy. Spontaneous Posterior facet arthrodesis on the LEFT. Slight osseous spurring. Slight LEFT foraminal narrowing does not clearly affect the C3 nerve root. C3-4: Trace anterolisthesis is facet mediated. Severe BILATERAL facet arthropathy. Uncinate spurring worse on the LEFT. LEFT greater than RIGHT C4 nerve root impingement. C4-5: Severe BILATERAL facet arthropathy with ossified facet spur eccentric to the LEFT, identified on prior neck CT. This results in a 9 x 10 mm extradural mass displacing the cord from LEFT to RIGHT. Severe LEFT C5 nerve root impingement. C5-6: Advanced facet arthropathy. Medial facet ossification also extends into the  epidural space on the LEFT, not as severe as the level above. Disc space narrowing with uncinate spurring and disc osteophyte complex. LEFT greater than RIGHT C6 nerve root impingement. Mild cord flattening. Canal diameter 7-8 mm. C6-7: Severe disc space narrowing. Disc osteophyte complex extends more to the LEFT. LEFT greater than RIGHT C7 nerve root impingement. Mild cord flattening. Canal diameter C7-T1:  Unremarkable. IMPRESSION: Multilevel spondylosis as described, with severe LEFT-sided neural impingement at C4-5 and C5-6 due to medially projecting and ossific facet arthropathy and superimposed disc pathology. Disc space narrowing with osseous spurring results in LEFT greater than RIGHT foraminal narrowing at C6-7. Slight facet mediated anterolisthesis at C3-4 and C4-5 does not demonstrate significant dynamic instability. Electronically Signed   By: Staci Righter M.D.   On: 09/26/2015 12:45    Time Spent in minutes  25 minutes    Yacine Garriga M.D on 10/20/2015 at 1:36 PM  Between 7am to 7pm - Pager - [phone]  After 7pm go to [email or web address] - password [facility]  [facility] -  Office  [phone]

## 2015-10-20 NOTE — Progress Notes (Signed)
Physical Therapy Treatment Patient Details Name: Lindsey Bentley MRN: [id number] DOB: 1946-02-08 Today's Date: 10/20/2015    History of Present Illness Pt with c/o neck pain and altered sensation in face and shoulder. On 6/6 pt s/p L C4-5 hemilaminectomy for removal of calcified mass.    PT Comments    Pt presents with fear-avoidance behavior, does report improved pain relief today following pre-medication.  Able to perform modest ambulatory task with min assist.  Provided encouragement and instruction for pain management during mobility and praise following for effort.  Discussed with RN pt's limitations to progress... Would a pysch consult be helpful in this situation?    Follow Up Recommendations  Home health PT;Supervision/Assistance - 24 hour     Equipment Recommendations  Rolling walker with 5" wheels    Recommendations for Other Services       Precautions / Restrictions Precautions Precautions: Cervical Precaution Comments: Reviewed precautions Required Braces or Orthoses: Cervical Brace Cervical Brace: Soft collar Restrictions Weight Bearing Restrictions: No    Mobility  Bed Mobility Overal bed mobility:  (up in/back to chari)             General bed mobility comments: pt preferred to remain sitting up at end of session  Transfers Overall transfer level: Needs assistance Equipment used: None Transfers: Sit to/from Stand Sit to Stand: Supervision         General transfer comment: cue to push from arms of chair, minimize use of arms as able  Ambulation/Gait Ambulation/Gait assistance: Min assist Ambulation Distance (Feet): 100 Feet Assistive device: 1 person hand held assist Gait Pattern/deviations: Step-through pattern;Decreased stride length (guarded) Gait velocity: slow Gait velocity interpretation: Below normal speed for age/gender General Gait Details: attempted HHA for gait, instructed in posture, breathing and pacing to improve performance and  control/prevent pain, pt initally taking very short steps and slow velocity, able to incr  modestly with occasional reinforcing cues   Stairs            Wheelchair Mobility    Modified Rankin (Stroke Patients Only)       Balance Overall balance assessment: Needs assistance Sitting-balance support: No upper extremity supported;Feet supported Sitting balance-Leahy Scale: Good     Standing balance support: No upper extremity supported;During functional activity Standing balance-Leahy Scale: Fair                      Cognition Arousal/Alertness: Awake/alert Behavior During Therapy: WFL for tasks assessed/performed;Anxious Overall Cognitive Status: Within Functional Limits for tasks assessed                      Exercises      General Comments        Pertinent Vitals/Pain Pain Assessment: 0-10 Pain Score: 2  Pain Location: neck Pain Intervention(s): Limited activity within patient's tolerance;Monitored during session;Premedicated before session;Repositioned;Relaxation (educated on breathing to control anxiety)    Home Living                      Prior Function            PT Goals (current goals can now be found in the care plan section) Acute Rehab PT Goals Patient Stated Goal: no pain Progress towards PT goals: Progressing toward goals    Frequency  Min 5X/week    PT Plan Current plan remains appropriate    Co-evaluation             End  of Session Equipment Utilized During Treatment: Gait belt;Cervical collar Activity Tolerance: Patient tolerated treatment well (with cues to [facility] anxiety) Patient left: in chair;with call bell/phone within reach     Time: 0952-1017 PT Time Calculation (min) (ACUTE ONLY): 25 min  Charges:  $Gait Training: 8-22 mins $Therapeutic Activity: 8-22 mins                    G Codes:  Functional Assessment Tool Used: clincial judgment Functional Limitation: Mobility: Walking and moving  around Mobility: Walking and Moving Around Current Status [phone]): At least 40 percent but less than 60 percent impaired, limited or restricted Mobility: Walking and Moving Around Goal Status [phone]): At least 1 percent but less than 20 percent impaired, limited or restricted   Herbie Drape 10/20/2015, 12:52 PM

## 2015-10-20 NOTE — Progress Notes (Signed)
[name], female   DOB: [birth year], 70 y.o.   MRN: [id number] Minimal incisional pain, waiting for internal medicine for advise about discharge

## 2015-10-21 DIAGNOSIS — K219 Gastro-esophageal reflux disease without esophagitis: Secondary | ICD-10-CM | POA: Diagnosis not present

## 2015-10-21 DIAGNOSIS — M50121 Cervical disc disorder at C4-C5 level with radiculopathy: Secondary | ICD-10-CM | POA: Diagnosis not present

## 2015-10-21 DIAGNOSIS — R269 Unspecified abnormalities of gait and mobility: Secondary | ICD-10-CM | POA: Diagnosis not present

## 2015-10-21 DIAGNOSIS — M6748 Ganglion, other site: Secondary | ICD-10-CM | POA: Diagnosis not present

## 2015-10-21 DIAGNOSIS — M1990 Unspecified osteoarthritis, unspecified site: Secondary | ICD-10-CM | POA: Diagnosis not present

## 2015-10-21 DIAGNOSIS — I10 Essential (primary) hypertension: Secondary | ICD-10-CM | POA: Diagnosis not present

## 2015-10-21 DIAGNOSIS — I2510 Atherosclerotic heart disease of native coronary artery without angina pectoris: Secondary | ICD-10-CM | POA: Diagnosis not present

## 2015-10-21 DIAGNOSIS — D492 Neoplasm of unspecified behavior of bone, soft tissue, and skin: Secondary | ICD-10-CM | POA: Diagnosis not present

## 2015-10-21 DIAGNOSIS — E785 Hyperlipidemia, unspecified: Secondary | ICD-10-CM | POA: Diagnosis not present

## 2015-10-21 DIAGNOSIS — G4733 Obstructive sleep apnea (adult) (pediatric): Secondary | ICD-10-CM | POA: Diagnosis not present

## 2015-10-21 DIAGNOSIS — I739 Peripheral vascular disease, unspecified: Secondary | ICD-10-CM | POA: Diagnosis not present

## 2015-10-21 MED ORDER — HYDRALAZINE HCL 25 MG PO TABS: 50.0000 mg | ORAL_TABLET | Freq: Three times a day (TID) | ORAL | Status: DC

## 2015-10-21 MED ORDER — ALPRAZOLAM 0.25 MG PO TABS
0.2500 mg | ORAL_TABLET | Freq: Once | ORAL | Status: AC
  Administered 2015-10-21: 0.25 mg via ORAL
  Filled 2015-10-21: qty 1

## 2015-10-21 NOTE — Progress Notes (Signed)
Physical Therapy Treatment Patient Details Name: Lindsey Bentley MRN: [id number] DOB: 01/20/1946 Today's Date: 10/21/2015    History of Present Illness Pt with c/o neck pain and altered sensation in face and shoulder. On 6/6 pt s/p L C4-5 hemilaminectomy for removal of calcified mass.    PT Comments    Pt progression limited by onset of cervical pain with upright position. Pt is now functioning at supervision level. Pt with good techniques. Discussed traveling in car with pt and daughter and optimal positioning.  Follow Up Recommendations  Home health PT;Supervision/Assistance - 24 hour     Equipment Recommendations  Rolling walker with 5" wheels    Recommendations for Other Services       Precautions / Restrictions Precautions Precautions: Cervical Precaution Comments: Reviewed precautions Required Braces or Orthoses: Cervical Brace Cervical Brace: Soft collar Restrictions Weight Bearing Restrictions: No    Mobility  Bed Mobility Overal bed mobility: Modified Independent Bed Mobility: Rolling;Sidelying to Sit;Sit to Sidelying Rolling: Modified independent (Device/Increase time) Sidelying to sit: Modified independent (Device/Increase time)     Sit to sidelying: Modified independent (Device/Increase time) General bed mobility comments: v/c's for hand placement, increased time  Transfers Overall transfer level: Needs assistance Equipment used: Rolling walker (2 wheeled) Transfers: Sit to/from Stand Sit to Stand: Supervision         General transfer comment: v/c's to push up from bed, icnreased time  Ambulation/Gait Ambulation/Gait assistance: Supervision Ambulation Distance (Feet): 100 Feet Assistive device: Rolling walker (2 wheeled) Gait Pattern/deviations: Step-through pattern;Decreased stride length Gait velocity: slow Gait velocity interpretation: Below Lindsey speed for age/gender General Gait Details: v/c's to relax shoulder and tighten abdominal  muscles,    Stairs            Wheelchair Mobility    Modified Rankin (Stroke Patients Only)       Balance                                    Cognition Arousal/Alertness: Awake/alert Behavior During Therapy: WFL for tasks assessed/performed Overall Cognitive Status: Within Functional Limits for tasks assessed                      Exercises      General Comments        Pertinent Vitals/Pain Pain Assessment: 0-10 Pain Score: 5  Pain Location: neck, 1 at rest, 5 at end of walking Pain Descriptors / Indicators:  (pulling) Pain Intervention(s): Limited activity within patient's tolerance    Home Living                      Prior Function            PT Goals (current goals can now be found in the care plan section) Acute Rehab PT Goals Patient Stated Goal: no pain Progress towards PT goals: Progressing toward goals    Frequency  Min 3X/week    PT Plan Current plan remains appropriate;Frequency needs to be updated    Co-evaluation             End of Session Equipment Utilized During Treatment: Gait belt;Cervical collar Activity Tolerance: Patient tolerated treatment well Patient left: in bed;with call bell/phone within reach;with bed alarm set;with family/visitor present     Time: [id number] PT Time Calculation (min) (ACUTE ONLY): 19 min  Charges:  $Gait Training: 8-22 mins  G Codes:      Kingsley Callander 10/21/2015, 3:41 PM   Kittie Plater, PT, DPT Pager #: [phone] Office #: [phone]

## 2015-10-21 NOTE — Progress Notes (Signed)
Occupational Therapy Treatment Patient Details Name: Lindsey Bentley MRN: [id number] DOB: 10/31/1945 Today's Date: 10/21/2015    History of present illness Pt with c/o neck pain and altered sensation in face and shoulder. On 6/6 pt s/p L C4-5 hemilaminectomy for removal of calcified mass.   OT comments  Patient treatment by Occupational Therapy with no further acute OT needs identified. All education has been completed and the patient has no further questions. See below for any follow-up Occupational Therapy or equipment needs. OT to sign off. Thank you for referral.    Follow Up Recommendations  No OT follow up;Supervision/Assistance - 24 hour    Equipment Recommendations  3 in 1 bedside comode    Recommendations for Other Services      Precautions / Restrictions Precautions Precautions: Cervical Precaution Comments: Reviewed precautions Required Braces or Orthoses: Cervical Brace Cervical Brace: Soft collar Restrictions Weight Bearing Restrictions: No       Mobility Bed Mobility Overal bed mobility: Modified Independent Bed Mobility: Rolling;Sidelying to Sit;Sit to Sidelying Rolling: Modified independent (Device/Increase time) Sidelying to sit: Modified independent (Device/Increase time)     Sit to sidelying: Modified independent (Device/Increase time) General bed mobility comments: v/c's for hand placement, increased time  Transfers Overall transfer level: Needs assistance Equipment used: Rolling walker (2 wheeled) Transfers: Sit to/from Stand Sit to Stand: Supervision         General transfer comment: v/c's to push up from bed, icnreased time    Balance                                   ADL Overall ADL's : Needs assistance/impaired                         Toilet Transfer: Supervision/safety;Ambulation;RW;BSC Toilet Transfer Details (indicate cue type and reason): required (A) for posterior peri hygiene Toileting- Clothing  Manipulation and Hygiene: Moderate assistance   Tub/ Shower Transfer: Min guard;3 in [place] walker;Walk-in shower Tub/Shower Transfer Details (indicate cue type and reason): educated patient and daughter. Pt stepping in with R and out with the L Functional mobility during ADLs: Min guard General ADL Comments: pt with all education complete and adequate help and education complete      Vision                     Perception     Praxis      Cognition   Behavior During Therapy: WFL for tasks assessed/performed Overall Cognitive Status: Within Functional Limits for tasks assessed                       Extremity/Trunk Assessment               Exercises     Shoulder Instructions       General Comments      Pertinent Vitals/ Pain       Pain Assessment: No/denies pain Pain Score: 5  Pain Location: neck, 1 at rest, 5 at end of walking Pain Descriptors / Indicators:  (pulling) Pain Intervention(s): Limited activity within patient's tolerance  Home Living                                          Prior Functioning/Environment  Frequency       Progress Toward Goals  OT Goals(current goals can now be found in the care plan section)  Progress towards OT goals: Goals met/education completed, patient discharged from OT  Acute Rehab OT Goals Patient Stated Goal: no pain OT Goal Formulation: With patient/family Time For Goal Achievement: 10/30/15 Potential to Achieve Goals: Good ADL Goals Pt Will Perform Grooming: standing;with supervision Pt Will Perform Upper Body Bathing: sitting;with modified independence Pt Will Perform Lower Body Bathing: with supervision;sit to/from stand Pt Will Transfer to Toilet: ambulating;bedside commode;with supervision Pt Will Perform Toileting - Clothing Manipulation and hygiene: with supervision;with adaptive equipment;sit to/from stand Pt Will Perform Tub/Shower Transfer: Shower  transfer;with supervision;ambulating;3 in 1;rolling walker Additional ADL Goal #1: Pt will independently maintain cervical precautions throughout ADL. Additional ADL Goal #2: Pt/caregiver will independently don/doff cervical collar as precursor for ADLs and functional mobility.  Plan All goals met and education completed, patient discharged from OT services    Co-evaluation                 End of Session Equipment Utilized During Treatment: Cervical collar;Gait belt;Rolling walker   Activity Tolerance Patient tolerated treatment well   Patient Left in chair;with call bell/phone within reach;with chair alarm set;with family/visitor present   Nurse Communication Mobility status;Other (comment)    Functional Assessment Tool Used: Clinical judgement Functional Limitation: Self care Self Care Current Status (V3710): At least 1 percent but less than 20 percent impaired, limited or restricted Self Care Goal Status (G2694): At least 1 percent but less than 20 percent impaired, limited or restricted Self Care Discharge Status [phone]): At least 1 percent but less than 20 percent impaired, limited or restricted   Time: 7035-0093 OT Time Calculation (min): 20 min  Charges: OT G-codes **NOT FOR INPATIENT CLASS** Functional Assessment Tool Used: Clinical judgement Functional Limitation: Self care Self Care Current Status (G1829): At least 1 percent but less than 20 percent impaired, limited or restricted Self Care Goal Status (H3716): At least 1 percent but less than 20 percent impaired, limited or restricted Self Care Discharge Status [phone]): At least 1 percent but less than 20 percent impaired, limited or restricted OT General Charges $OT Visit: 1 Procedure OT Treatments $Self Care/Home Management : 8-22 mins  Parke Poisson B 10/21/2015, 3:59 PM    Jeri Modena   OTR/L Pager: [phone] Office: [phone] .

## 2015-10-21 NOTE — Care Management Note (Signed)
Case Management Note  Patient Details  Name: KOBY HARTFIELD MRN: [id number] Date of Birth: 29-Sep-1945  Subjective/Objective:                    Action/Plan: Patient discharging home today with [facility] services. CM met with the patient and her daughter and provided them a list of [facility] agencies in the [place] area. They selected [facility]. CM spoke with [facility] and faxed the information requested. CM awaits to hear back on acceptance of the referral. Pt also ordered 3 in 1 and walker. James with [facility] D[facility] notified and will deliver the equipment to the room. Will update the bedside RN.   Expected Discharge Date:                  Expected Discharge Plan:  [place]  In-House Referral:     Discharge planning Services  CM Consult  Post Acute Care Choice:  Durable Medical Equipment, Home Health Choice offered to:  Patient, Adult Children  DME Arranged:  3-N-1, Walker rolling DME Agency:  [place] Arranged:  PT, Nurse's Aide [place] Agency:  [place]  Status of Service:  Completed, signed off  Medicare Important Message Given:  Yes Date Medicare IM Given:    Medicare IM give by:    Date Additional Medicare IM Given:    Additional Medicare Important Message give by:     If discussed at [place] of Stay Meetings, dates discussed:    Additional Comments:  Pollie Friar, RN 10/21/2015, 11:13 AM

## 2015-10-21 NOTE — Progress Notes (Signed)
PROGRESS NOTE                                                                                                                                                                                                             Patient Demographics:    Lindsey Bentley, is a 70 y.o. female, DOB - [birth year], [id number]  Admit date - 10/15/2015   Admitting Physician Leeroy Cha, MD  Outpatient Primary MD for the patient is Einar Pheasant, MD  LOS - 6  No chief complaint on file.      Brief Narrative  70 y.o. female with past medical history significant for depression, GERD, HLD, HTN, PVD, breast cancer, CAD, hyperglycemia, thyroid dysfunction who presented to [facility] for elective spinal surgery for removal of spinal mass. Patient S/P left cervical 4 through fifth hemilaminectomy and removal of calcified mass, Pathology significant for calcified ganglion cyst, hospitalist were consulted for uncontrolled blood pressure.   Subjective:    Lindsey Bentley today has, No headache, No chest pain, No abdominal pain - No Nausea, No new weakness tingling or numbness0   Assessment  & Plan :    Active Problems:   Hypertension   CAD (coronary artery disease)   Cervical spine tumor   Musculoskeletal pain   S/P spinal surgery  Hypertension - Blood pressure usually controlled as an outpatient, currently Elevated during hospital stay, most likely related to anxiety and pain, continue with Hyzaar, will continue with Coreg at current dose with no increase given heart rate in 60s and 70s, blood pressure better controlled after starting hydralazine,Hydralazine was significantly increased due to poorly controlled blood pressure, currently on hydralazine 75 mg oral 3 times daily, blood pressure been acceptable, but giving diastolic blood pressure been on this AS recently, we'll discharge on hydralazine 50 mg oral 3 times daily, is to be reassessed by her PCP as an  outpatient.  OSA - On  CPAP  hemilaminectomy and removal of calcified mass - Pathology showing calcified ganglion cells, further management per neurosurgery    Lab Results  Component Value Date   PLT 208 10/10/2015    Antibiotics  :    Anti-infectives    Start     Dose/Rate Route Frequency Ordered Stop   10/15/15 1700  ceFAZolin (ANCEF) IVPB 1 g/50 mL premix     1 g  100 mL/hr over 30 Minutes Intravenous Every 8 hours 10/15/15 1349 10/16/15 0140   10/15/15 1106  vancomycin (VANCOCIN) powder  Status:  Discontinued       As needed 10/15/15 1106 10/15/15 1138   10/15/15 1104  vancomycin (VANCOCIN) 1000 MG powder    Comments:  Carroll Kinds   : cabinet override      10/15/15 1104 10/15/15 2314   10/15/15 0600  ceFAZolin (ANCEF) IVPB 2g/100 mL premix     2 g 200 mL/hr over 30 Minutes Intravenous To [facility] 10/14/15 1221 10/15/15 0929        Objective:   Filed Vitals:   10/20/15 2202 10/20/15 2317 10/21/15 0140 10/21/15 0549  BP: 162/55  150/50 122/64  Pulse: 70 74 67 88  Temp: 98.7 F (37.1 C)  98.6 F (37 C) 98.6 F (37 C)  TempSrc: Oral  Oral Oral  Resp: 16 18 16 18   Height:      Weight:      SpO2: 95%  95% 94%    Wt Readings from Last 3 Encounters:  10/16/15 98.8 kg (217 lb 13 oz)  10/10/15 90.855 kg (200 lb 4.8 oz)  10/09/15 91.627 kg (202 lb)     Intake/Output Summary (Last 24 hours) at 10/21/15 1051 Last data filed at 10/21/15 0900  Gross per 24 hour  Intake    120 ml  Output      0 ml  Net    120 ml     Physical Exam  Awake Alert, Oriented X 3. Supple Neck,No JVD, Soft cervical collar Symmetrical Chest wall movement, Good air movement bilaterally, CTAB RRR,No Gallops,Rubs or new Murmurs, No Parasternal Heave +ve B.Sounds, Abd Soft, No tenderness, No rebound - guarding or rigidity. No Cyanosis, Clubbing or edema, No new Rash or bruise    Data Review:    CBC No results for input(s): WBC, HGB, HCT, PLT, MCV, MCH, MCHC, RDW,  LYMPHSABS, MONOABS, EOSABS, BASOSABS, BANDABS in the last 168 hours.  Invalid input(s): NEUTRABS, BANDSABD  Chemistries   Recent Labs Lab 10/19/15 0824  NA 136  K 3.8  CL 101  CO2 26  GLUCOSE 102*  BUN 21*  CREATININE 0.96  CALCIUM 9.1   ------------------------------------------------------------------------------------------------------------------ No results for input(s): CHOL, HDL, LDLCALC, TRIG, CHOLHDL, LDLDIRECT in the last 72 hours.  Lab Results  Component Value Date   HGBA1C 6.6* 10/04/2015   ------------------------------------------------------------------------------------------------------------------ No results for input(s): TSH, T4TOTAL, T3FREE, THYROIDAB in the last 72 hours.  Invalid input(s): FREET3 ------------------------------------------------------------------------------------------------------------------ No results for input(s): VITAMINB12, FOLATE, FERRITIN, TIBC, IRON, RETICCTPCT in the last 72 hours.  Coagulation profile No results for input(s): INR, PROTIME in the last 168 hours.  No results for input(s): DDIMER in the last 72 hours.  Cardiac Enzymes No results for input(s): CKMB, TROPONINI, MYOGLOBIN in the last 168 hours.  Invalid input(s): CK ------------------------------------------------------------------------------------------------------------------ No results found for: BNP  Inpatient Medications  Scheduled Meds: . aspirin  325 mg Oral Daily  . carvedilol  3.125 mg Oral BID WC  . hydrALAZINE  75 mg Oral Q8H  . losartan  100 mg Oral Daily   And  . hydrochlorothiazide  25 mg Oral Daily  . letrozole  2.5 mg Oral Daily  . rosuvastatin  10 mg Oral q1800  . senna  1 tablet Oral BID  . sodium chloride flush  3 mL Intravenous Q12H   Continuous Infusions: . sodium chloride    . lactated ringers 10 mL/hr at 10/15/15 [phone]  PRN Meds:.acetaminophen **OR** acetaminophen, hydrALAZINE, menthol-cetylpyridinium **OR** phenol,  metoprolol, morphine injection, ondansetron, promethazine, sodium chloride flush, sodium phosphate, traMADol  Micro Results No results found for this or any previous visit (from the past 240 hour(s)).  Radiology Reports Dg Cervical Spine 2-3 Views  10/15/2015  CLINICAL DATA:  Left C4-5 hemilaminectomy and removal of mass. EXAM: CERVICAL SPINE - 2-3 VIEW; DG C-ARM 61-120 MIN COMPARISON:  09/26/2015 cervical myelogram. FINDINGS: Fluoroscopy time 7 seconds. Spot fluoroscopic nondiagnostic intraoperative cervical spine radiographs demonstrates a posterior approach surgical marking device terminating over the C4 spinous process. IMPRESSION: Intraoperative fluoroscopic guidance for left C4 hemilaminectomy. Electronically Signed   By: Ilona Sorrel M.D.   On: 10/15/2015 12:07   Dg Cervical Spine 2-3 Views  10/15/2015  CLINICAL DATA:  C4-C5 laminectomy and mass removal EXAM: CERVICAL SPINE - 2-3 VIEW COMPARISON:  09/26/2015 FINDINGS: Two lateral views of the cervical spine submitted. On the second film there is a posterior localization needle at C3 level, spinous process level. Facet degenerative changes are noted C3-C4 level. IMPRESSION: Posterior localization needle on the second film at C3 level. Electronically Signed   By: Lahoma Crocker M.D.   On: 10/15/2015 10:53   Ct Cervical Spine W Contrast  09/26/2015  CLINICAL DATA:  Neck pain. LEFT shoulder pain. Decreased range of motion FLUOROSCOPY TIME:  43 seconds corresponding to a Dose Area Product of 125 Gy*m2 PROCEDURE: LUMBAR PUNCTURE FOR CERVICAL MYELOGRAM After thorough discussion of risks and benefits of the procedure including bleeding, infection, injury to nerves, blood vessels, adjacent structures as well as headache and CSF leak, written and oral informed consent was obtained. Consent was obtained by Dr. Rolla Flatten. We discussed the high likelihood of obtaining a diagnostic study. Patient was positioned prone on the fluoroscopy table. Local anesthesia was  provided with 1% lidocaine without epinephrine after prepped and draped in the usual sterile fashion. Puncture was performed at L3-L4 using a 3 1/2 inch 22-gauge spinal needle via RIGHT paramedian approach. Using a single pass through the dura, the needle was placed within the thecal sac, with return of clear CSF. 10 mL of Isovue-M 300 was injected into the thecal sac, with normal opacification of the nerve roots and cauda equina consistent with free flow within the subarachnoid space. The patient was then moved to the trendelenburg position and contrast flowed into the Cervical spine region. I personally performed the lumbar puncture and administered the intrathecal contrast. I also personally supervised acquisition of the myelogram images. TECHNIQUE: Contiguous axial images were obtained through the Cervical spine after the intrathecal infusion of infusion. Coronal and sagittal reconstructions were obtained of the axial image sets. FINDINGS: CERVICAL MYELOGRAM FINDINGS: Good opacification cervical subarachnoid space. Severe disc space narrowing at C5-6 and C6-7, with effacement of the exiting C6 and C7 nerve roots, LEFT greater than RIGHT, at those levels respectively. There is also a large asymmetric extradural defect at C4-5 on the LEFT related to posterior element hypertrophy. Upright Lateral flexion extension views demonstrate 1 mm anterolisthesis at C3-4 and 2 mm anterolisthesis C4-5 which are facet mediated, slightly worse in flexion, reducing to anatomic alignment and extension. No significant movement at C5-6 or C6-7. CT CERVICAL MYELOGRAM FINDINGS: Alignment: Trace anterolisthesis C3-4 and C4-5 facet mediated. Vertebrae: Osseous spurring, but no destructive lesion. Cord: Multilevel stenosis with displacement and flattening, described below. Posterior Fossa: No tonsillar herniation. Vertebral Arteries: Not assessed. Paraspinal tissues: Unremarkable. Lung apices clear. Atherosclerosis. Disc levels: The  individual disc spaces were examined as follows: C2-3:  BILATERAL facet arthropathy. Spontaneous Posterior facet arthrodesis on the LEFT. Slight osseous spurring. Slight LEFT foraminal narrowing does not clearly affect the C3 nerve root. C3-4: Trace anterolisthesis is facet mediated. Severe BILATERAL facet arthropathy. Uncinate spurring worse on the LEFT. LEFT greater than RIGHT C4 nerve root impingement. C4-5: Severe BILATERAL facet arthropathy with ossified facet spur eccentric to the LEFT, identified on prior neck CT. This results in a 9 x 10 mm extradural mass displacing the cord from LEFT to RIGHT. Severe LEFT C5 nerve root impingement. C5-6: Advanced facet arthropathy. Medial facet ossification also extends into the epidural space on the LEFT, not as severe as the level above. Disc space narrowing with uncinate spurring and disc osteophyte complex. LEFT greater than RIGHT C6 nerve root impingement. Mild cord flattening. Canal diameter 7-8 mm. C6-7: Severe disc space narrowing. Disc osteophyte complex extends more to the LEFT. LEFT greater than RIGHT C7 nerve root impingement. Mild cord flattening. Canal diameter C7-T1:  Unremarkable. IMPRESSION: Multilevel spondylosis as described, with severe LEFT-sided neural impingement at C4-5 and C5-6 due to medially projecting and ossific facet arthropathy and superimposed disc pathology. Disc space narrowing with osseous spurring results in LEFT greater than RIGHT foraminal narrowing at C6-7. Slight facet mediated anterolisthesis at C3-4 and C4-5 does not demonstrate significant dynamic instability. Electronically Signed   By: Staci Righter M.D.   On: 09/26/2015 12:45   Dg C-arm 1-60 Min  10/15/2015  CLINICAL DATA:  Left C4-5 hemilaminectomy and removal of mass. EXAM: CERVICAL SPINE - 2-3 VIEW; DG C-ARM 61-120 MIN COMPARISON:  09/26/2015 cervical myelogram. FINDINGS: Fluoroscopy time 7 seconds. Spot fluoroscopic nondiagnostic intraoperative cervical spine radiographs  demonstrates a posterior approach surgical marking device terminating over the C4 spinous process. IMPRESSION: Intraoperative fluoroscopic guidance for left C4 hemilaminectomy. Electronically Signed   By: Ilona Sorrel M.D.   On: 10/15/2015 12:07   Dg Myelography Lumbar Inj Cervical  09/26/2015  CLINICAL DATA:  Neck pain. LEFT shoulder pain. Decreased range of motion FLUOROSCOPY TIME:  43 seconds corresponding to a Dose Area Product of 125 Gy*m2 PROCEDURE: LUMBAR PUNCTURE FOR CERVICAL MYELOGRAM After thorough discussion of risks and benefits of the procedure including bleeding, infection, injury to nerves, blood vessels, adjacent structures as well as headache and CSF leak, written and oral informed consent was obtained. Consent was obtained by Dr. Rolla Flatten. We discussed the high likelihood of obtaining a diagnostic study. Patient was positioned prone on the fluoroscopy table. Local anesthesia was provided with 1% lidocaine without epinephrine after prepped and draped in the usual sterile fashion. Puncture was performed at L3-L4 using a 3 1/2 inch 22-gauge spinal needle via RIGHT paramedian approach. Using a single pass through the dura, the needle was placed within the thecal sac, with return of clear CSF. 10 mL of Isovue-M 300 was injected into the thecal sac, with normal opacification of the nerve roots and cauda equina consistent with free flow within the subarachnoid space. The patient was then moved to the trendelenburg position and contrast flowed into the Cervical spine region. I personally performed the lumbar puncture and administered the intrathecal contrast. I also personally supervised acquisition of the myelogram images. TECHNIQUE: Contiguous axial images were obtained through the Cervical spine after the intrathecal infusion of infusion. Coronal and sagittal reconstructions were obtained of the axial image sets. FINDINGS: CERVICAL MYELOGRAM FINDINGS: Good opacification cervical subarachnoid  space. Severe disc space narrowing at C5-6 and C6-7, with effacement of the exiting C6 and C7 nerve roots, LEFT greater than RIGHT,  at those levels respectively. There is also a large asymmetric extradural defect at C4-5 on the LEFT related to posterior element hypertrophy. Upright Lateral flexion extension views demonstrate 1 mm anterolisthesis at C3-4 and 2 mm anterolisthesis C4-5 which are facet mediated, slightly worse in flexion, reducing to anatomic alignment and extension. No significant movement at C5-6 or C6-7. CT CERVICAL MYELOGRAM FINDINGS: Alignment: Trace anterolisthesis C3-4 and C4-5 facet mediated. Vertebrae: Osseous spurring, but no destructive lesion. Cord: Multilevel stenosis with displacement and flattening, described below. Posterior Fossa: No tonsillar herniation. Vertebral Arteries: Not assessed. Paraspinal tissues: Unremarkable. Lung apices clear. Atherosclerosis. Disc levels: The individual disc spaces were examined as follows: C2-3: BILATERAL facet arthropathy. Spontaneous Posterior facet arthrodesis on the LEFT. Slight osseous spurring. Slight LEFT foraminal narrowing does not clearly affect the C3 nerve root. C3-4: Trace anterolisthesis is facet mediated. Severe BILATERAL facet arthropathy. Uncinate spurring worse on the LEFT. LEFT greater than RIGHT C4 nerve root impingement. C4-5: Severe BILATERAL facet arthropathy with ossified facet spur eccentric to the LEFT, identified on prior neck CT. This results in a 9 x 10 mm extradural mass displacing the cord from LEFT to RIGHT. Severe LEFT C5 nerve root impingement. C5-6: Advanced facet arthropathy. Medial facet ossification also extends into the epidural space on the LEFT, not as severe as the level above. Disc space narrowing with uncinate spurring and disc osteophyte complex. LEFT greater than RIGHT C6 nerve root impingement. Mild cord flattening. Canal diameter 7-8 mm. C6-7: Severe disc space narrowing. Disc osteophyte complex extends  more to the LEFT. LEFT greater than RIGHT C7 nerve root impingement. Mild cord flattening. Canal diameter C7-T1:  Unremarkable. IMPRESSION: Multilevel spondylosis as described, with severe LEFT-sided neural impingement at C4-5 and C5-6 due to medially projecting and ossific facet arthropathy and superimposed disc pathology. Disc space narrowing with osseous spurring results in LEFT greater than RIGHT foraminal narrowing at C6-7. Slight facet mediated anterolisthesis at C3-4 and C4-5 does not demonstrate significant dynamic instability. Electronically Signed   By: Staci Righter M.D.   On: 09/26/2015 12:45     Makalyn Lennox M.D on 10/21/2015 at 10:51 AM  Between 7am to 7pm - Pager - [phone]  After 7pm go to [email or web address] - password [facility]  [facility] -  Office  [phone]

## 2015-10-21 NOTE — Progress Notes (Signed)
Pt being discharged from hospital per orders from MD. Pt and family educated on discharge information. Pt and family verbalized understanding of information. All questions and concerns were addressed. RN spoke with OT who stated worked with patients today. Pt's IV's were removed before discharge. Pt exited hospital via wheelchair.

## 2015-10-21 NOTE — Progress Notes (Signed)
[facility] called and is unable to accept this patient for [facility] services. CM revisited the patient and her daughter and they chose [facility]. Drew with [facility] notified and accepted the referral.

## 2015-10-21 NOTE — Discharge Summary (Signed)
Physician Discharge Summary  [name] MRN: [id number] DOB/AGE: [birth year] 70 y.o.  Admit date: 10/15/2015 Discharge date: 10/21/2015  Admission Diagnoses:cerviacal spine tumor.  Arterial hypertension  Discharge Diagnoses:  Active Problems:   Hypertension   CAD (coronary artery disease)   Cervical spine tumor   Musculoskeletal pain   S/P spinal surgery   Discharged Condition:satble  Hospital Course: surgery  Consults: hospitalist  Significant Diagnostic Studies:myelogram  Treatments: removal masss  Discharge Exam: Blood pressure 122/64, pulse 88, temperature 98.6 F (37 C), temperature source Oral, resp. rate 18, height 5\' 4"  (1.626 m), weight 98.8 kg (217 lb 13 oz), SpO2 94 %. No weakness wound dry  Disposition: home     Medication List    ASK your doctor about these medications        BAYER ASPIRIN 325 MG tablet  Generic drug:  aspirin  Take 325 mg by mouth daily.     carvedilol 3.125 MG tablet  Commonly known as:  COREG  Take 3.125 mg by mouth 2 (two) times daily with a meal.     letrozole 2.5 MG tablet  Commonly known as:  FEMARA  Take 1 tablet (2.5 mg total) by mouth daily.     losartan-hydrochlorothiazide 100-25 MG tablet  Commonly known as:  HYZAAR  TAKE 1 TABLET BY MOUTH EVERY DAY     rosuvastatin 10 MG tablet  Commonly known as:  CRESTOR  TAKE 1 TABLET(10 MG) BY MOUTH DAILY     VITAMIN D-1000 MAX ST 1000 units tablet  Generic drug:  Cholecalciferol  Take 1,000 Units by mouth daily.     vitamin E 400 UNIT capsule  Take 400 Units by mouth daily.         Signed: Floyce Stakes 10/21/2015, 10:21 AM

## 2015-10-22 DIAGNOSIS — I10 Essential (primary) hypertension: Secondary | ICD-10-CM | POA: Diagnosis not present

## 2015-10-22 DIAGNOSIS — I2510 Atherosclerotic heart disease of native coronary artery without angina pectoris: Secondary | ICD-10-CM | POA: Diagnosis not present

## 2015-10-22 DIAGNOSIS — Z853 Personal history of malignant neoplasm of breast: Secondary | ICD-10-CM | POA: Diagnosis not present

## 2015-10-22 DIAGNOSIS — G4730 Sleep apnea, unspecified: Secondary | ICD-10-CM | POA: Diagnosis not present

## 2015-10-22 DIAGNOSIS — E785 Hyperlipidemia, unspecified: Secondary | ICD-10-CM | POA: Diagnosis not present

## 2015-10-22 DIAGNOSIS — R2689 Other abnormalities of gait and mobility: Secondary | ICD-10-CM | POA: Diagnosis not present

## 2015-10-22 DIAGNOSIS — K219 Gastro-esophageal reflux disease without esophagitis: Secondary | ICD-10-CM | POA: Diagnosis not present

## 2015-10-22 DIAGNOSIS — Z7982 Long term (current) use of aspirin: Secondary | ICD-10-CM | POA: Diagnosis not present

## 2015-10-22 DIAGNOSIS — Z4889 Encounter for other specified surgical aftercare: Secondary | ICD-10-CM | POA: Diagnosis not present

## 2015-10-22 DIAGNOSIS — I739 Peripheral vascular disease, unspecified: Secondary | ICD-10-CM | POA: Diagnosis not present

## 2015-10-25 DIAGNOSIS — G4730 Sleep apnea, unspecified: Secondary | ICD-10-CM | POA: Diagnosis not present

## 2015-10-25 DIAGNOSIS — R2689 Other abnormalities of gait and mobility: Secondary | ICD-10-CM | POA: Diagnosis not present

## 2015-10-25 DIAGNOSIS — Z7982 Long term (current) use of aspirin: Secondary | ICD-10-CM | POA: Diagnosis not present

## 2015-10-25 DIAGNOSIS — I739 Peripheral vascular disease, unspecified: Secondary | ICD-10-CM | POA: Diagnosis not present

## 2015-10-25 DIAGNOSIS — K219 Gastro-esophageal reflux disease without esophagitis: Secondary | ICD-10-CM | POA: Diagnosis not present

## 2015-10-25 DIAGNOSIS — E785 Hyperlipidemia, unspecified: Secondary | ICD-10-CM | POA: Diagnosis not present

## 2015-10-25 DIAGNOSIS — Z4889 Encounter for other specified surgical aftercare: Secondary | ICD-10-CM | POA: Diagnosis not present

## 2015-10-25 DIAGNOSIS — I10 Essential (primary) hypertension: Secondary | ICD-10-CM | POA: Diagnosis not present

## 2015-10-25 DIAGNOSIS — Z853 Personal history of malignant neoplasm of breast: Secondary | ICD-10-CM | POA: Diagnosis not present

## 2015-10-25 DIAGNOSIS — I2510 Atherosclerotic heart disease of native coronary artery without angina pectoris: Secondary | ICD-10-CM | POA: Diagnosis not present

## 2015-10-27 ENCOUNTER — Other Ambulatory Visit: Payer: Self-pay | Admitting: General Surgery

## 2015-10-28 DIAGNOSIS — G4730 Sleep apnea, unspecified: Secondary | ICD-10-CM | POA: Diagnosis not present

## 2015-10-28 DIAGNOSIS — Z7982 Long term (current) use of aspirin: Secondary | ICD-10-CM | POA: Diagnosis not present

## 2015-10-28 DIAGNOSIS — Z853 Personal history of malignant neoplasm of breast: Secondary | ICD-10-CM | POA: Diagnosis not present

## 2015-10-28 DIAGNOSIS — R2689 Other abnormalities of gait and mobility: Secondary | ICD-10-CM | POA: Diagnosis not present

## 2015-10-28 DIAGNOSIS — E785 Hyperlipidemia, unspecified: Secondary | ICD-10-CM | POA: Diagnosis not present

## 2015-10-28 DIAGNOSIS — I10 Essential (primary) hypertension: Secondary | ICD-10-CM | POA: Diagnosis not present

## 2015-10-28 DIAGNOSIS — I2510 Atherosclerotic heart disease of native coronary artery without angina pectoris: Secondary | ICD-10-CM | POA: Diagnosis not present

## 2015-10-28 DIAGNOSIS — I739 Peripheral vascular disease, unspecified: Secondary | ICD-10-CM | POA: Diagnosis not present

## 2015-10-28 DIAGNOSIS — K219 Gastro-esophageal reflux disease without esophagitis: Secondary | ICD-10-CM | POA: Diagnosis not present

## 2015-10-28 DIAGNOSIS — Z4889 Encounter for other specified surgical aftercare: Secondary | ICD-10-CM | POA: Diagnosis not present

## 2015-10-29 DIAGNOSIS — I10 Essential (primary) hypertension: Secondary | ICD-10-CM | POA: Diagnosis not present

## 2015-10-29 DIAGNOSIS — E785 Hyperlipidemia, unspecified: Secondary | ICD-10-CM | POA: Diagnosis not present

## 2015-10-29 DIAGNOSIS — K219 Gastro-esophageal reflux disease without esophagitis: Secondary | ICD-10-CM | POA: Diagnosis not present

## 2015-10-29 DIAGNOSIS — Z853 Personal history of malignant neoplasm of breast: Secondary | ICD-10-CM | POA: Diagnosis not present

## 2015-10-29 DIAGNOSIS — R2689 Other abnormalities of gait and mobility: Secondary | ICD-10-CM | POA: Diagnosis not present

## 2015-10-29 DIAGNOSIS — Z4889 Encounter for other specified surgical aftercare: Secondary | ICD-10-CM | POA: Diagnosis not present

## 2015-10-29 DIAGNOSIS — Z7982 Long term (current) use of aspirin: Secondary | ICD-10-CM | POA: Diagnosis not present

## 2015-10-29 DIAGNOSIS — I2510 Atherosclerotic heart disease of native coronary artery without angina pectoris: Secondary | ICD-10-CM | POA: Diagnosis not present

## 2015-10-29 DIAGNOSIS — G4730 Sleep apnea, unspecified: Secondary | ICD-10-CM | POA: Diagnosis not present

## 2015-10-29 DIAGNOSIS — I739 Peripheral vascular disease, unspecified: Secondary | ICD-10-CM | POA: Diagnosis not present

## 2015-10-30 DIAGNOSIS — Z853 Personal history of malignant neoplasm of breast: Secondary | ICD-10-CM | POA: Diagnosis not present

## 2015-10-30 DIAGNOSIS — E785 Hyperlipidemia, unspecified: Secondary | ICD-10-CM | POA: Diagnosis not present

## 2015-10-30 DIAGNOSIS — I10 Essential (primary) hypertension: Secondary | ICD-10-CM | POA: Diagnosis not present

## 2015-10-30 DIAGNOSIS — I2510 Atherosclerotic heart disease of native coronary artery without angina pectoris: Secondary | ICD-10-CM | POA: Diagnosis not present

## 2015-10-30 DIAGNOSIS — G4730 Sleep apnea, unspecified: Secondary | ICD-10-CM | POA: Diagnosis not present

## 2015-10-30 DIAGNOSIS — Z4889 Encounter for other specified surgical aftercare: Secondary | ICD-10-CM | POA: Diagnosis not present

## 2015-10-30 DIAGNOSIS — Z7982 Long term (current) use of aspirin: Secondary | ICD-10-CM | POA: Diagnosis not present

## 2015-10-30 DIAGNOSIS — K219 Gastro-esophageal reflux disease without esophagitis: Secondary | ICD-10-CM | POA: Diagnosis not present

## 2015-10-30 DIAGNOSIS — R2689 Other abnormalities of gait and mobility: Secondary | ICD-10-CM | POA: Diagnosis not present

## 2015-10-30 DIAGNOSIS — I739 Peripheral vascular disease, unspecified: Secondary | ICD-10-CM | POA: Diagnosis not present

## 2015-10-31 DIAGNOSIS — I739 Peripheral vascular disease, unspecified: Secondary | ICD-10-CM | POA: Diagnosis not present

## 2015-10-31 DIAGNOSIS — Z7982 Long term (current) use of aspirin: Secondary | ICD-10-CM | POA: Diagnosis not present

## 2015-10-31 DIAGNOSIS — Z853 Personal history of malignant neoplasm of breast: Secondary | ICD-10-CM | POA: Diagnosis not present

## 2015-10-31 DIAGNOSIS — E785 Hyperlipidemia, unspecified: Secondary | ICD-10-CM | POA: Diagnosis not present

## 2015-10-31 DIAGNOSIS — R2689 Other abnormalities of gait and mobility: Secondary | ICD-10-CM | POA: Diagnosis not present

## 2015-10-31 DIAGNOSIS — I2510 Atherosclerotic heart disease of native coronary artery without angina pectoris: Secondary | ICD-10-CM | POA: Diagnosis not present

## 2015-10-31 DIAGNOSIS — I10 Essential (primary) hypertension: Secondary | ICD-10-CM | POA: Diagnosis not present

## 2015-10-31 DIAGNOSIS — K219 Gastro-esophageal reflux disease without esophagitis: Secondary | ICD-10-CM | POA: Diagnosis not present

## 2015-10-31 DIAGNOSIS — Z4889 Encounter for other specified surgical aftercare: Secondary | ICD-10-CM | POA: Diagnosis not present

## 2015-10-31 DIAGNOSIS — G4730 Sleep apnea, unspecified: Secondary | ICD-10-CM | POA: Diagnosis not present

## 2015-11-01 ENCOUNTER — Telehealth: Payer: Self-pay | Admitting: *Deleted

## 2015-11-01 DIAGNOSIS — I739 Peripheral vascular disease, unspecified: Secondary | ICD-10-CM | POA: Diagnosis not present

## 2015-11-01 DIAGNOSIS — E785 Hyperlipidemia, unspecified: Secondary | ICD-10-CM | POA: Diagnosis not present

## 2015-11-01 DIAGNOSIS — I2510 Atherosclerotic heart disease of native coronary artery without angina pectoris: Secondary | ICD-10-CM | POA: Diagnosis not present

## 2015-11-01 DIAGNOSIS — G4730 Sleep apnea, unspecified: Secondary | ICD-10-CM | POA: Diagnosis not present

## 2015-11-01 DIAGNOSIS — Z853 Personal history of malignant neoplasm of breast: Secondary | ICD-10-CM | POA: Diagnosis not present

## 2015-11-01 DIAGNOSIS — I10 Essential (primary) hypertension: Secondary | ICD-10-CM | POA: Diagnosis not present

## 2015-11-01 DIAGNOSIS — K219 Gastro-esophageal reflux disease without esophagitis: Secondary | ICD-10-CM | POA: Diagnosis not present

## 2015-11-01 DIAGNOSIS — Z7982 Long term (current) use of aspirin: Secondary | ICD-10-CM | POA: Diagnosis not present

## 2015-11-01 DIAGNOSIS — Z4889 Encounter for other specified surgical aftercare: Secondary | ICD-10-CM | POA: Diagnosis not present

## 2015-11-01 DIAGNOSIS — R2689 Other abnormalities of gait and mobility: Secondary | ICD-10-CM | POA: Diagnosis not present

## 2015-11-01 NOTE — Telephone Encounter (Signed)
Please advise 

## 2015-11-01 NOTE — Telephone Encounter (Signed)
FYI for Dr. Nicki Reaper: patient stated that she had a 8 day stay in the hospital. She is now home, she's receiving care from [facility] and family. She has stopped all hospital medication and now taking her regular scheduled medication prescribed by Dr.Scott. She's using her cane to walk.

## 2015-11-03 NOTE — Telephone Encounter (Signed)
Please let her know that I was out of the office last week.  Please call and thank her for the update regarding her surgery.  Tell her to let us know if she needs anything.

## 2015-11-04 DIAGNOSIS — K219 Gastro-esophageal reflux disease without esophagitis: Secondary | ICD-10-CM | POA: Diagnosis not present

## 2015-11-04 DIAGNOSIS — Z4889 Encounter for other specified surgical aftercare: Secondary | ICD-10-CM | POA: Diagnosis not present

## 2015-11-04 DIAGNOSIS — G4730 Sleep apnea, unspecified: Secondary | ICD-10-CM | POA: Diagnosis not present

## 2015-11-04 DIAGNOSIS — Z7982 Long term (current) use of aspirin: Secondary | ICD-10-CM | POA: Diagnosis not present

## 2015-11-04 DIAGNOSIS — I10 Essential (primary) hypertension: Secondary | ICD-10-CM | POA: Diagnosis not present

## 2015-11-04 DIAGNOSIS — I2510 Atherosclerotic heart disease of native coronary artery without angina pectoris: Secondary | ICD-10-CM | POA: Diagnosis not present

## 2015-11-04 DIAGNOSIS — I739 Peripheral vascular disease, unspecified: Secondary | ICD-10-CM | POA: Diagnosis not present

## 2015-11-04 DIAGNOSIS — R2689 Other abnormalities of gait and mobility: Secondary | ICD-10-CM | POA: Diagnosis not present

## 2015-11-04 DIAGNOSIS — E785 Hyperlipidemia, unspecified: Secondary | ICD-10-CM | POA: Diagnosis not present

## 2015-11-04 DIAGNOSIS — Z853 Personal history of malignant neoplasm of breast: Secondary | ICD-10-CM | POA: Diagnosis not present

## 2015-11-06 DIAGNOSIS — Z853 Personal history of malignant neoplasm of breast: Secondary | ICD-10-CM | POA: Diagnosis not present

## 2015-11-06 DIAGNOSIS — Z7982 Long term (current) use of aspirin: Secondary | ICD-10-CM | POA: Diagnosis not present

## 2015-11-06 DIAGNOSIS — R2689 Other abnormalities of gait and mobility: Secondary | ICD-10-CM | POA: Diagnosis not present

## 2015-11-06 DIAGNOSIS — Z4889 Encounter for other specified surgical aftercare: Secondary | ICD-10-CM | POA: Diagnosis not present

## 2015-11-06 DIAGNOSIS — I10 Essential (primary) hypertension: Secondary | ICD-10-CM | POA: Diagnosis not present

## 2015-11-06 DIAGNOSIS — E785 Hyperlipidemia, unspecified: Secondary | ICD-10-CM | POA: Diagnosis not present

## 2015-11-06 DIAGNOSIS — K219 Gastro-esophageal reflux disease without esophagitis: Secondary | ICD-10-CM | POA: Diagnosis not present

## 2015-11-06 DIAGNOSIS — G4730 Sleep apnea, unspecified: Secondary | ICD-10-CM | POA: Diagnosis not present

## 2015-11-06 DIAGNOSIS — I2510 Atherosclerotic heart disease of native coronary artery without angina pectoris: Secondary | ICD-10-CM | POA: Diagnosis not present

## 2015-11-06 DIAGNOSIS — I739 Peripheral vascular disease, unspecified: Secondary | ICD-10-CM | POA: Diagnosis not present

## 2015-11-08 DIAGNOSIS — K219 Gastro-esophageal reflux disease without esophagitis: Secondary | ICD-10-CM | POA: Diagnosis not present

## 2015-11-08 DIAGNOSIS — E785 Hyperlipidemia, unspecified: Secondary | ICD-10-CM | POA: Diagnosis not present

## 2015-11-08 DIAGNOSIS — R2689 Other abnormalities of gait and mobility: Secondary | ICD-10-CM | POA: Diagnosis not present

## 2015-11-08 DIAGNOSIS — Z7982 Long term (current) use of aspirin: Secondary | ICD-10-CM | POA: Diagnosis not present

## 2015-11-08 DIAGNOSIS — Z853 Personal history of malignant neoplasm of breast: Secondary | ICD-10-CM | POA: Diagnosis not present

## 2015-11-08 DIAGNOSIS — I10 Essential (primary) hypertension: Secondary | ICD-10-CM | POA: Diagnosis not present

## 2015-11-08 DIAGNOSIS — G4730 Sleep apnea, unspecified: Secondary | ICD-10-CM | POA: Diagnosis not present

## 2015-11-08 DIAGNOSIS — I2510 Atherosclerotic heart disease of native coronary artery without angina pectoris: Secondary | ICD-10-CM | POA: Diagnosis not present

## 2015-11-08 DIAGNOSIS — Z4889 Encounter for other specified surgical aftercare: Secondary | ICD-10-CM | POA: Diagnosis not present

## 2015-11-08 DIAGNOSIS — I739 Peripheral vascular disease, unspecified: Secondary | ICD-10-CM | POA: Diagnosis not present

## 2015-11-13 DIAGNOSIS — I739 Peripheral vascular disease, unspecified: Secondary | ICD-10-CM | POA: Diagnosis not present

## 2015-11-13 DIAGNOSIS — G4730 Sleep apnea, unspecified: Secondary | ICD-10-CM | POA: Diagnosis not present

## 2015-11-13 DIAGNOSIS — Z853 Personal history of malignant neoplasm of breast: Secondary | ICD-10-CM | POA: Diagnosis not present

## 2015-11-13 DIAGNOSIS — I10 Essential (primary) hypertension: Secondary | ICD-10-CM | POA: Diagnosis not present

## 2015-11-13 DIAGNOSIS — R2689 Other abnormalities of gait and mobility: Secondary | ICD-10-CM | POA: Diagnosis not present

## 2015-11-13 DIAGNOSIS — K219 Gastro-esophageal reflux disease without esophagitis: Secondary | ICD-10-CM | POA: Diagnosis not present

## 2015-11-13 DIAGNOSIS — Z4889 Encounter for other specified surgical aftercare: Secondary | ICD-10-CM | POA: Diagnosis not present

## 2015-11-13 DIAGNOSIS — E785 Hyperlipidemia, unspecified: Secondary | ICD-10-CM | POA: Diagnosis not present

## 2015-11-13 DIAGNOSIS — Z7982 Long term (current) use of aspirin: Secondary | ICD-10-CM | POA: Diagnosis not present

## 2015-11-13 DIAGNOSIS — I2510 Atherosclerotic heart disease of native coronary artery without angina pectoris: Secondary | ICD-10-CM | POA: Diagnosis not present

## 2015-11-20 ENCOUNTER — Other Ambulatory Visit (INDEPENDENT_AMBULATORY_CARE_PROVIDER_SITE_OTHER): Payer: Medicare Other

## 2015-11-20 DIAGNOSIS — R946 Abnormal results of thyroid function studies: Secondary | ICD-10-CM

## 2015-11-20 DIAGNOSIS — R7989 Other specified abnormal findings of blood chemistry: Secondary | ICD-10-CM

## 2015-11-20 LAB — TSH: TSH: 3 u[IU]/mL (ref 0.35–4.50)

## 2015-11-21 ENCOUNTER — Encounter: Payer: Self-pay | Admitting: *Deleted

## 2015-11-27 ENCOUNTER — Other Ambulatory Visit: Payer: Self-pay | Admitting: General Surgery

## 2015-12-03 DIAGNOSIS — H5203 Hypermetropia, bilateral: Secondary | ICD-10-CM | POA: Diagnosis not present

## 2015-12-03 DIAGNOSIS — H524 Presbyopia: Secondary | ICD-10-CM | POA: Diagnosis not present

## 2015-12-03 DIAGNOSIS — H2513 Age-related nuclear cataract, bilateral: Secondary | ICD-10-CM | POA: Diagnosis not present

## 2015-12-03 DIAGNOSIS — H52223 Regular astigmatism, bilateral: Secondary | ICD-10-CM | POA: Diagnosis not present

## 2015-12-04 DIAGNOSIS — G4733 Obstructive sleep apnea (adult) (pediatric): Secondary | ICD-10-CM | POA: Diagnosis not present

## 2015-12-05 NOTE — Telephone Encounter (Signed)
The patient is doing well she has had her return office visit with the surgeon and she is healing well. She is also able to drive.

## 2015-12-11 DIAGNOSIS — G4733 Obstructive sleep apnea (adult) (pediatric): Secondary | ICD-10-CM | POA: Diagnosis not present

## 2015-12-18 ENCOUNTER — Ambulatory Visit (INDEPENDENT_AMBULATORY_CARE_PROVIDER_SITE_OTHER): Payer: Medicare Other | Admitting: Internal Medicine

## 2015-12-18 ENCOUNTER — Encounter: Payer: Self-pay | Admitting: Internal Medicine

## 2015-12-18 ENCOUNTER — Ambulatory Visit
Admission: RE | Admit: 2015-12-18 | Discharge: 2015-12-18 | Disposition: A | Discharge status: Home / Self Care | Payer: Medicare Other | Source: Ambulatory Visit | Attending: Internal Medicine | Admitting: Internal Medicine

## 2015-12-18 VITALS — BP 130/70 | HR 76 | Temp 98.2°F | Resp 18 | Ht 64.0 in | Wt 200.4 lb

## 2015-12-18 DIAGNOSIS — I10 Essential (primary) hypertension: Secondary | ICD-10-CM | POA: Diagnosis not present

## 2015-12-18 DIAGNOSIS — R0781 Pleurodynia: Secondary | ICD-10-CM | POA: Diagnosis not present

## 2015-12-18 DIAGNOSIS — R079 Chest pain, unspecified: Secondary | ICD-10-CM

## 2015-12-18 DIAGNOSIS — N6489 Other specified disorders of breast: Secondary | ICD-10-CM | POA: Diagnosis not present

## 2015-12-18 DIAGNOSIS — M542 Cervicalgia: Secondary | ICD-10-CM

## 2015-12-18 NOTE — Progress Notes (Signed)
Pre-visit discussion using our clinic review tool. No additional management support is needed unless otherwise documented below in the visit note.  

## 2015-12-18 NOTE — Progress Notes (Signed)
[name], female   DOB: 07-23-1945, 71 y.o.   MRN: [id number]   Subjective:    [name], female    DOB: [birth year], 70 y.o.   MRN: [id number]  HPI  Patient here as a work in with concerns regarding a pain under her breast.  States she first noticed this several days ago.  States she just stood up and noticed the pain. Pain localized under left breast - lower anterior ribs and she could feel it through to her back.  Has continued.  Increased pain to palpation.  No other chest pain.  No sob.  No rash.  No fever.  She is eating and drinking.  No abdominal pain.  She also reports persistent soreness and fullness where she had her breast surgery.  States this has been present since her surgery.     Past Medical History:  Diagnosis Date  . Arthritis   . Cancer (HCC)    Breast cancer - TI, NO, MO - IDC, ER/PR pos, Her 2 neg  . 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 Coronary artery disease   . Depression   . Diffuse cystic mastopathy 2013  . Elevated TSH   . Family history of adverse reaction to anesthesia    Pt stated that son had a seizure with a combination of anesthesia and pain medication."  . GERD (gastroesophageal reflux disease)   . Glaucoma   . High cholesterol   . History of chicken pox   . Hyperglycemia   . Hyperlipidemia   . Hypertension   . Peripheral vascular disease ([place])   . Pneumonia March 2014  . Sleep apnea    wears CPAP set at 2.5  . Wears glasses    Past Surgical History:  Procedure Laterality Date  . BACK SURGERY  1986   ruptured disc  . BREAST BIOPSY  2008  . BREAST BIOPSY Left 08-20-14  . BREAST SURGERY Left 09-05-14   left breast lumpectomy with SLN biopsy, ER/PR positive. HER2 negative.  . CHOLECYSTECTOMY  2006  . COLONOSCOPY  2010   Dr. Jamal Collin  . DILATION AND CURETTAGE OF UTERUS    . Leg stent  2011  . MOLE REMOVAL  2013   15 removed  . POSTERIOR CERVICAL LAMINECTOMY Left 10/15/2015   Procedure: Left Cervical four- five Hemilaminectomy/Remove mass;  Surgeon: Leeroy Cha, MD;  Location: [facility];  Service: Neurosurgery;  Laterality: Left;  Left C4-5 Hemilaminectomy/Remove mass   Family History  Problem Relation Age of Onset  . Cancer Father     Prostate  . Hyperlipidemia Other     Parent  . Miscarriages / Stillbirths Other     Parent  . Hypertension Other     parent  . Heart disease Other     Parent  . Breast cancer Maternal Aunt    Social History   Social History  . Marital status: Married    Spouse name: N/A  . Number of children: 3  . Years of education: 12   Occupational History  . Caregiver     Social History Main Topics  . Smoking status: Never Smoker  . Smokeless tobacco: Never Used  . Alcohol use No  . Drug use: No  . Sexual activity: No   Other Topics Concern  . None   Social History Narrative   Regular exercise-mo   Caffeine Use-no    Outpatient Encounter Prescriptions as of 12/18/2015  Medication Sig  . aspirin (BAYER ASPIRIN) 325 MG tablet Take 325 mg by mouth daily.  . carvedilol (COREG) 3.125 MG tablet Take 3.125 mg by mouth 2 (two) times daily with a meal.   . Cholecalciferol (VITAMIN D-1000 MAX ST) 1000 units tablet Take 1,000 Units by mouth daily.   [name] letrozole (FEMARA) 2.5 MG tablet TAKE 1 TABLET BY MOUTH EVERY DAY  . losartan-hydrochlorothiazide (HYZAAR) 100-25 MG tablet TAKE 1 TABLET BY MOUTH EVERY DAY  . rosuvastatin (CRESTOR) 10 MG tablet TAKE 1 TABLET(10 MG) BY MOUTH DAILY  . vitamin E 400 UNIT capsule Take 400 Units by mouth daily.  . [DISCONTINUED] hydrALAZINE (APRESOLINE) 25 MG tablet Take 2 tablets (50 mg total) by mouth 3 (three) times daily.   No facility-administered encounter medications on file as of 12/18/2015.     Review of Systems  Constitutional: Negative for fever and unexpected weight change.  Respiratory: Negative for cough, chest tightness and shortness of breath.   Cardiovascular: Negative for  palpitations and leg swelling.       Reports lower anterior rib pain - worse with palpation.    Gastrointestinal: Negative for abdominal pain, diarrhea, nausea and vomiting.  Musculoskeletal:       Pain radiates around left side and back.  Pain more localized to left lower anterior rib.    Skin: Negative for color change and rash.  Neurological: Negative for dizziness, light-headedness and headaches.  Psychiatric/Behavioral: Negative for agitation and dysphoric mood.       Objective:    Physical Exam  Constitutional: She appears well-developed and well-nourished. No distress.  HENT:  Nose: Nose normal.  Mouth/Throat: Oropharynx is clear and moist.  Neck: Neck supple.  Cardiovascular: Normal rate and regular rhythm.   Pulmonary/Chest: Breath sounds normal. No respiratory distress. She has no wheezes.  Breast exam - fullness and hard area approximately 5:00 left breast.  Minimal soreness to palpation.   Abdominal: Soft. Bowel sounds are normal. There is no tenderness.  Musculoskeletal:  Increased pain to palpation over the left lower anterior ribs.  Reproducible on exam.    Lymphadenopathy:    She has no cervical adenopathy.  Skin: No rash noted. No erythema.  Psychiatric: She has a normal mood and affect. Her behavior is normal.    BP 130/70   Pulse 76   Temp 98.2 F (36.8 C) (Oral)   Resp 18   Ht _0  (1.626 m)   Wt 200 lb 6 oz (90.9 kg)   SpO2 96%   BMI 34.39 kg/m  Wt Readings from Last 3 Encounters:  12/18/15 200 lb 6 oz (90.9 kg)  10/16/15 217 lb 13 oz (98.8 kg)  10/10/15 200 lb 4.8 oz (90.9 kg)     Lab Results  Component Value Date   WBC 6.3 10/10/2015   HGB 12.9 10/10/2015   HCT 38.2 10/10/2015   PLT 208 10/10/2015   GLUCOSE 102 (H) 10/19/2015   CHOL 182 10/04/2015   TRIG 214.0 (H) 10/04/2015   HDL 39.30 10/04/2015   LDLDIRECT 110.0 10/04/2015   LDLCALC 108 (H) 01/01/2015   ALT 30 10/04/2015   AST 20 10/04/2015   NA 136 10/19/2015   K 3.8  10/19/2015   CL 101 10/19/2015   CREATININE 0.96 10/19/2015   BUN 21 (H) 10/19/2015   CO2 26 10/19/2015   TSH 3.00 11/20/2015   INR 0.9 07/13/2012   HGBA1C 6.6 (H) 10/04/2015       Assessment & Plan:   Problem List  Items Addressed This Visit    Chest pain    Localized left lower anterior rib.  Reproducible on exam.  No rash.  No known injury or trauma.  Lungs clear.  Will check cxr and rib xray.  Tylenol helps.  Take scheduled.  Follow.        Relevant Orders   DG Chest 2 View (Completed)   Fullness of breast    Fullness and hard area - left breast.  States has been present since her surgery.  Will have Dr Jamal Collin reevaluate.        Relevant Orders   Ambulatory referral to General Surgery   Hypertension    States her blood pressure has been well controlled since being home from her surgery.  States averaging 430T systolic.  Continue same medication regimen.  Follow.       Neck pain    S/p neck surgery.  Doing well.        Other Visit Diagnoses    Rib pain    -  Primary   Relevant Orders   DG Ribs Unilateral Left (Completed)       Einar Pheasant, MD

## 2015-12-19 ENCOUNTER — Encounter: Payer: Self-pay | Admitting: Internal Medicine

## 2015-12-19 DIAGNOSIS — N6489 Other specified disorders of breast: Secondary | ICD-10-CM | POA: Insufficient documentation

## 2015-12-19 NOTE — Assessment & Plan Note (Signed)
Fullness and hard area - left breast.  States has been present since her surgery.  Will have Dr Jamal Collin reevaluate.

## 2015-12-19 NOTE — Assessment & Plan Note (Signed)
States her blood pressure has been well controlled since being home from her surgery.  States averaging [id number] systolic.  Continue same medication regimen.  Follow.

## 2015-12-19 NOTE — Assessment & Plan Note (Signed)
Localized left lower anterior rib.  Reproducible on exam.  No rash.  No known injury or trauma.  Lungs clear.  Will check cxr and rib xray.  Tylenol helps.  Take scheduled.  Follow.

## 2015-12-19 NOTE — Assessment & Plan Note (Signed)
S/p neck surgery.  Doing well.   

## 2015-12-24 ENCOUNTER — Encounter: Payer: Self-pay | Admitting: *Deleted

## 2015-12-25 ENCOUNTER — Telehealth: Payer: Self-pay | Admitting: Internal Medicine

## 2015-12-25 ENCOUNTER — Other Ambulatory Visit: Payer: Self-pay | Admitting: General Surgery

## 2015-12-25 ENCOUNTER — Encounter: Payer: Self-pay | Admitting: General Surgery

## 2015-12-25 ENCOUNTER — Ambulatory Visit (INDEPENDENT_AMBULATORY_CARE_PROVIDER_SITE_OTHER): Payer: Medicare Other | Admitting: General Surgery

## 2015-12-25 VITALS — BP 166/82 | HR 70 | Resp 14 | Ht 64.5 in | Wt 200.0 lb

## 2015-12-25 DIAGNOSIS — Z853 Personal history of malignant neoplasm of breast: Secondary | ICD-10-CM | POA: Diagnosis not present

## 2015-12-25 DIAGNOSIS — M940 Chondrocostal junction syndrome [Tietze]: Secondary | ICD-10-CM

## 2015-12-25 NOTE — Telephone Encounter (Signed)
Per patient: Inflammation was found under the breast after a breast exam was done. He recommended an anti-inflammatory other than the Tylenol that Dr. Nicki Reaper recommended. She told him she has to be careful due to her BP. Her nodule is due to calcium buildup from the radiation. Will be due for a mammogram in October & will go from there. Was told to talk to PCP about which anti-inflammatory to take. Please advise which you prefer.

## 2015-12-25 NOTE — Telephone Encounter (Signed)
If going to use antiinflammatory, then she does need to monitor blood pressure.  Gentle use.  advil (no more than 1-2 tablets twice a day as needed).  Monitor blood pressure and for any stomach issues.  If any problems, will need to stop.

## 2015-12-25 NOTE — Progress Notes (Signed)
[name], female   DOB: 1945-06-11, 70 y.o.   MRN: [id number]  Chief Complaint  Patient presents with  . Follow-up    left breast    HPI Lindsey Bentley is a 70 y.o. female here today for left breast fullness. Patient states that she was seeing her PCP for some rib pain that referred to her back and, on examination, the physician found a lump in the lower outer quadrant of the left breast. Patient notes that the PCP wishes for me to examine her. Personal history of CA, left breast lower outer quadrant. 1 year post lumpectomy and radiation. The current lump is in the area of the prior lumpectomy. Currently on letrozole and doing well.  I have reviewed the history of present illness with the patient.   HPI  Past Medical History:  Diagnosis Date  . Arthritis   . Cancer (HCC)    Breast cancer - TI, NO, MO - IDC, ER/PR pos, Her 2 neg  . 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 Coronary artery disease   . Depression   . Diffuse cystic mastopathy 2013  . Elevated TSH   . Family history of adverse reaction to anesthesia    Pt stated that son had a seizure with a combination of anesthesia and pain medication."  . GERD (gastroesophageal reflux disease)   . Glaucoma   . High cholesterol   . History of chicken pox   . Hyperglycemia   . Hyperlipidemia   . Hypertension   . Peripheral vascular disease ([place])   . Pneumonia March 2014  . Sleep apnea    wears CPAP set at 2.5  . Wears glasses     Past Surgical History:  Procedure Laterality Date  . BACK SURGERY  1986   ruptured disc  . BREAST BIOPSY  2008  . BREAST BIOPSY Left 08-20-14  . BREAST SURGERY Left 09-05-14   left breast lumpectomy with SLN biopsy, ER/PR positive. HER2 negative.  . CHOLECYSTECTOMY  2006  . COLONOSCOPY  2010   Dr. Jamal Collin  . DILATION AND CURETTAGE OF UTERUS    . Leg stent  2011  . MOLE REMOVAL  2013   15 removed  . POSTERIOR CERVICAL LAMINECTOMY Left 10/15/2015    Procedure: Left Cervical four- five Hemilaminectomy/Remove mass;  Surgeon: Leeroy Cha, MD;  Location: [facility];  Service: Neurosurgery;  Laterality: Left;  Left C4-5 Hemilaminectomy/Remove mass    Family History  Problem Relation Age of Onset  . Cancer Father     Prostate  . Hyperlipidemia Other     Parent  . Miscarriages / Stillbirths Other     Parent  . Hypertension Other     parent  . Heart disease Other     Parent  . Breast cancer Maternal Aunt     Social History Social History  Substance Use Topics  . Smoking status: Never Smoker  . Smokeless tobacco: Never Used  . Alcohol use No    Allergies  Allergen Reactions  . Codeine Nausea And Vomiting  . Adhesive [Tape] Itching and Rash    Please use "paper" tape  . Prednisone Swelling  . Statins Other (See Comments)    Muscle Pain, can take Crestor    Current Outpatient Prescriptions  Medication Sig Dispense Refill  . aspirin (BAYER ASPIRIN) 325 MG tablet Take 325 mg by mouth daily.    . carvedilol (COREG) 3.125 MG tablet Take 3.125 mg by  mouth 2 (two) times daily with a meal.     . Cholecalciferol (VITAMIN D-1000 MAX ST) 1000 units tablet Take 1,000 Units by mouth daily.     . losartan-hydrochlorothiazide (HYZAAR) 100-25 MG tablet TAKE 1 TABLET BY MOUTH EVERY DAY 90 tablet 3  . rosuvastatin (CRESTOR) 10 MG tablet TAKE 1 TABLET(10 MG) BY MOUTH DAILY 30 tablet 11  . vitamin E 400 UNIT capsule Take 400 Units by mouth daily.    . letrozole (FEMARA) 2.5 MG tablet TAKE 1 TABLET BY MOUTH EVERY DAY 30 tablet 0   No current facility-administered medications for this visit.     Review of Systems Review of Systems  Constitutional: Negative.   Respiratory: Negative.   Cardiovascular: Negative.     Blood pressure (!) 166/82, pulse 70, resp. rate 14, height 5' 4.5" (1.638 m), weight 200 lb (90.7 kg).  Physical Exam Physical Exam  Constitutional: She is oriented to person, place, and time. She appears well-developed  and well-nourished.  Pulmonary/Chest: Right breast exhibits tenderness. Right breast exhibits no inverted nipple, no mass, no nipple discharge and no skin change. Left breast exhibits skin change. Left breast exhibits no inverted nipple, no mass, no nipple discharge and no tenderness.    Neurological: She is alert and oriented to person, place, and time.  Skin: Skin is warm and dry.    Data Reviewed  Prior notes, PCP notes  Assessment    CA, left breast lower outer quadrant. 1 year post lumpectomy and radiation. Currently on letrozole and doing well.  Tenderness along the costal margin - likely costochondritis  Left breast mass at lumpectomy site-consistent with fat necrosis-unchanged from last evaluation  Plan   Area of firmness left lower quadrant, left breast- likely fat necrosis from lumpectomy - patient advised to be mindful of any changes in that area. Costochondritis - recommended naproxen, ibuprofen, Aleve; follow up with PCP     Patient to return in two months following left breast diagnostic mammogram  This information has been scribed by Marsha Hatch RN, BSN,BC.   SANKAR,SEEPLAPUTHUR G 12/25/2015, 1:53 PM   

## 2015-12-25 NOTE — Patient Instructions (Signed)
Return as scheduled 

## 2015-12-25 NOTE — Telephone Encounter (Signed)
Lindsey Bentley called saying Dr. Nicki Reaper asked that she contact us once she receives the results from Dr. Angie Fava office. She didn't want to tell me what the results were over the phone. Please give her a phone call.  Pt's ph# [phone] Thank you.

## 2015-12-25 NOTE — Telephone Encounter (Signed)
Pt.notified

## 2015-12-27 ENCOUNTER — Telehealth: Payer: Self-pay | Admitting: Internal Medicine

## 2015-12-27 NOTE — Telephone Encounter (Signed)
Were these orders place?

## 2015-12-27 NOTE — Telephone Encounter (Signed)
Lindsey Bentley [phone] called from [facility] regarding if Dr Nicki Reaper ordered home health services? Start of care 10/22/15. Thank you!

## 2015-12-28 ENCOUNTER — Encounter: Payer: Self-pay | Admitting: *Deleted

## 2015-12-28 DIAGNOSIS — I779 Disorder of arteries and arterioles, unspecified: Secondary | ICD-10-CM | POA: Insufficient documentation

## 2015-12-28 DIAGNOSIS — I6529 Occlusion and stenosis of unspecified carotid artery: Secondary | ICD-10-CM | POA: Insufficient documentation

## 2015-12-28 DIAGNOSIS — I890 Lymphedema, not elsewhere classified: Secondary | ICD-10-CM | POA: Insufficient documentation

## 2015-12-30 NOTE — Telephone Encounter (Signed)
It appears that home health was ordered after her neurosurgery procedure.  I think this was ordered by pts neurosurgeon.  Let me know if problems.

## 2015-12-31 NOTE — Telephone Encounter (Signed)
Lindsey Bentley has been informed.

## 2016-01-03 DIAGNOSIS — G4733 Obstructive sleep apnea (adult) (pediatric): Secondary | ICD-10-CM | POA: Diagnosis not present

## 2016-01-16 ENCOUNTER — Other Ambulatory Visit: Payer: Self-pay | Admitting: General Surgery

## 2016-01-16 DIAGNOSIS — C50512 Malignant neoplasm of lower-outer quadrant of left female breast: Secondary | ICD-10-CM

## 2016-01-27 ENCOUNTER — Other Ambulatory Visit: Payer: Self-pay | Admitting: General Surgery

## 2016-01-31 ENCOUNTER — Inpatient Hospital Stay
Admission: RE | Admit: 2016-01-31 | Discharge: 2016-01-31 | Disposition: A | Discharge status: Home / Self Care | Payer: Self-pay | Source: Ambulatory Visit | Attending: *Deleted | Admitting: *Deleted

## 2016-01-31 ENCOUNTER — Other Ambulatory Visit: Payer: Self-pay | Admitting: *Deleted

## 2016-01-31 DIAGNOSIS — Z9289 Personal history of other medical treatment: Secondary | ICD-10-CM

## 2016-02-03 ENCOUNTER — Encounter: Payer: Self-pay | Admitting: *Deleted

## 2016-02-12 ENCOUNTER — Encounter: Payer: Self-pay | Admitting: Internal Medicine

## 2016-02-12 ENCOUNTER — Encounter (INDEPENDENT_AMBULATORY_CARE_PROVIDER_SITE_OTHER): Payer: Self-pay

## 2016-02-12 ENCOUNTER — Ambulatory Visit (INDEPENDENT_AMBULATORY_CARE_PROVIDER_SITE_OTHER): Payer: Medicare Other | Admitting: Internal Medicine

## 2016-02-12 DIAGNOSIS — M542 Cervicalgia: Secondary | ICD-10-CM | POA: Diagnosis not present

## 2016-02-12 DIAGNOSIS — E7800 Pure hypercholesterolemia, unspecified: Secondary | ICD-10-CM

## 2016-02-12 DIAGNOSIS — K219 Gastro-esophageal reflux disease without esophagitis: Secondary | ICD-10-CM

## 2016-02-12 DIAGNOSIS — Z0000 Encounter for general adult medical examination without abnormal findings: Secondary | ICD-10-CM

## 2016-02-12 DIAGNOSIS — F439 Reaction to severe stress, unspecified: Secondary | ICD-10-CM

## 2016-02-12 DIAGNOSIS — C50912 Malignant neoplasm of unspecified site of left female breast: Secondary | ICD-10-CM

## 2016-02-12 DIAGNOSIS — I739 Peripheral vascular disease, unspecified: Secondary | ICD-10-CM

## 2016-02-12 DIAGNOSIS — R739 Hyperglycemia, unspecified: Secondary | ICD-10-CM

## 2016-02-12 DIAGNOSIS — I10 Essential (primary) hypertension: Secondary | ICD-10-CM | POA: Diagnosis not present

## 2016-02-12 MED ORDER — CARVEDILOL 6.25 MG PO TABS: 6.2500 mg | ORAL_TABLET | Freq: Two times a day (BID) | ORAL | 3 refills | Status: DC

## 2016-02-12 NOTE — Progress Notes (Signed)
[name], female   DOB: [birth year], 70 y.o.   MRN: [id number]   Subjective:    [name], female    DOB: [birth year], 70 y.o.   MRN: [id number]  HPI  Patient here for a scheduled follow up.  She reports that her blood pressure previously has been averaging 858-850Y systolic readings.  Last pm noted blood pressure of 170/68.  She denies any change in her diet.  Has been taking her medications.  Has noted some swelling.  She describes as being all over.  Does reports is worse if up a lot.  She does report some numbness in her finger tips and fingers/hands.  She relates this to her neck.  States did not notice until after her surgery.  She reports is worsened by certain positions.  Holding her hands up - numbness.   No chest pain.  No change in breathing.  No nausea or vomiting.  Bowels stable.  No  abdominal pain.     Past Medical History:  Diagnosis Date  . Arthritis   . Cancer (HCC)    Breast cancer - TI, NO, MO - IDC, ER/PR pos, Her 2 neg  . Carotid artery occlusion   . 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 Coronary artery disease   . Depression   . Diffuse cystic mastopathy 2013  . Elevated TSH   . Family history of adverse reaction to anesthesia    Pt stated that son had a seizure with a combination of anesthesia and pain medication."  . GERD (gastroesophageal reflux disease)   . Glaucoma   . High cholesterol   . History of chicken pox   . Hyperglycemia   . Hyperlipidemia   . Hypertension   . Peripheral vascular disease ([place])   . Pneumonia March 2014  . Sleep apnea    wears CPAP set at 2.5  . Wears glasses    Past Surgical History:  Procedure Laterality Date  . BACK SURGERY  1986   ruptured disc  . BREAST BIOPSY  2008  . BREAST BIOPSY Left 08-20-14  . BREAST SURGERY Left 09-05-14   left breast lumpectomy with SLN biopsy, ER/PR positive. HER2 negative.  . CHOLECYSTECTOMY  2006  . COLONOSCOPY  2010   Dr. Jamal Collin    . DILATION AND CURETTAGE OF UTERUS    . Leg stent  2011  . MOLE REMOVAL  2013   15 removed  . POSTERIOR CERVICAL LAMINECTOMY Left 10/15/2015   Procedure: Left Cervical four- five Hemilaminectomy/Remove mass;  Surgeon: Leeroy Cha, MD;  Location: [facility];  Service: Neurosurgery;  Laterality: Left;  Left C4-5 Hemilaminectomy/Remove mass   Family History  Problem Relation Age of Onset  . Cancer Father     Prostate  . Diabetes Father   . Cerebrovascular Accident Father   . Hypertension Mother   . AAA (abdominal aortic aneurysm) Mother   . Hyperlipidemia Other     Parent  . Miscarriages / Stillbirths Other     Parent  . Hypertension Other     parent  . Heart disease Other     Parent  . Breast cancer Maternal Aunt    Social History   Social History  . Marital status: Married    Spouse name: N/A  . Number of children: 3  . Years of education: 12   Occupational History  . Caregiver     Social History Main Topics  .  Smoking status: Never Smoker  . Smokeless tobacco: Never Used  . Alcohol use No  . Drug use: No  . Sexual activity: No   Other Topics Concern  . None   Social History Narrative   Regular exercise-mo   Caffeine Use-no    Outpatient Encounter Prescriptions as of 02/12/2016  Medication Sig  . aspirin (BAYER ASPIRIN) 325 MG tablet Take 325 mg by mouth daily.  . Cholecalciferol (VITAMIN D-1000 MAX ST) 1000 units tablet Take 1,000 Units by mouth daily.   [name] letrozole (FEMARA) 2.5 MG tablet TAKE 1 TABLET BY MOUTH EVERY DAY  . losartan-hydrochlorothiazide (HYZAAR) 100-25 MG tablet TAKE 1 TABLET BY MOUTH EVERY DAY  . rosuvastatin (CRESTOR) 10 MG tablet TAKE 1 TABLET(10 MG) BY MOUTH DAILY  . vitamin E 400 UNIT capsule Take 400 Units by mouth daily.  . [DISCONTINUED] carvedilol (COREG) 3.125 MG tablet Take 3.125 mg by mouth 2 (two) times daily with a meal.   . carvedilol (COREG) 6.25 MG tablet Take 1 tablet (6.25 mg total) by mouth 2 (two) times daily with a  meal.   No facility-administered encounter medications on file as of 02/12/2016.     Review of Systems  Constitutional: Negative for appetite change and unexpected weight change.  HENT: Negative for congestion and sinus pressure.   Respiratory: Negative for cough, chest tightness and shortness of breath.   Cardiovascular: Negative for chest pain and palpitations.       Some swelling.    Gastrointestinal: Negative for abdominal pain, diarrhea, nausea and vomiting.  Genitourinary: Negative for difficulty urinating and dysuria.  Musculoskeletal:       Previous neck pain.  Improved.  Finger/finger tip numbness as outlined.    Skin: Negative for color change and rash.  Neurological: Negative for dizziness, light-headedness and headaches.  Psychiatric/Behavioral: Negative for agitation and dysphoric mood.       Objective:    Physical Exam  Constitutional: She appears well-developed and well-nourished. No distress.  HENT:  Nose: Nose normal.  Mouth/Throat: Oropharynx is clear and moist.  Neck: Neck supple. No thyromegaly present.  Cardiovascular: Normal rate and regular rhythm.   Pulmonary/Chest: Breath sounds normal. No respiratory distress. She has no wheezes.  Abdominal: Soft. Bowel sounds are normal. There is no tenderness.  Musculoskeletal: She exhibits no edema or tenderness.  Lymphadenopathy:    She has no cervical adenopathy.  Skin: No rash noted. No erythema.  Psychiatric: She has a normal mood and affect. Her behavior is normal.    BP (!) 178/80   Pulse 76   Temp 98.6 F (37 C) (Oral)   Ht '5\' 5"'$  (1.651 m)   Wt 203 lb 3.2 oz (92.2 kg)   SpO2 95%   BMI 33.81 kg/m  Wt Readings from Last 3 Encounters:  02/12/16 203 lb 3.2 oz (92.2 kg)  12/25/15 200 lb (90.7 kg)  12/18/15 200 lb 6 oz (90.9 kg)     Lab Results  Component Value Date   WBC 6.3 10/10/2015   HGB 12.9 10/10/2015   HCT 38.2 10/10/2015   PLT 208 10/10/2015   GLUCOSE 102 (H) 10/19/2015   CHOL 182  10/04/2015   TRIG 214.0 (H) 10/04/2015   HDL 39.30 10/04/2015   LDLDIRECT 110.0 10/04/2015   LDLCALC 108 (H) 01/01/2015   ALT 30 10/04/2015   AST 20 10/04/2015   NA 136 10/19/2015   K 3.8 10/19/2015   CL 101 10/19/2015   CREATININE 0.96 10/19/2015   BUN 21 (H) 10/19/2015  CO2 26 10/19/2015   TSH 3.00 11/20/2015   INR 0.9 07/13/2012   HGBA1C 6.6 (H) 10/04/2015       Assessment & Plan:   Problem List Items Addressed This Visit    Breast cancer ([place])    On femara.  Recent pain - resolved.  Saw Dr Jamal Collin.  Follow.       GERD (gastroesophageal reflux disease)    No upper symptoms reported.  Follow.       Hypercholesterolemia    On crestor.  Low cholesterol diet and exercise.  Follow lipid panel and liver function tests.        Relevant Medications   carvedilol (COREG) 6.25 MG tablet   Other Relevant Orders   Lipid panel   Hepatic function panel   Hyperglycemia    Low carb diet and exercise.  Follow met b and a1c.       Relevant Orders   Hemoglobin A1c   Hypertension    Blood pressure remains elevated.  Increased carvedilol to 6/'25mg'$  bid.  Follow pressures.  Get her back in soon to reassess.       Relevant Medications   carvedilol (COREG) 6.25 MG tablet   Other Relevant Orders   Basic metabolic panel   Neck pain    S/p neck surgery.  Neck pain improved. She is having numbness in her fingers/finger tips.  Discussed wearing splint.  She feels is related to her neck.  Desires no further testing or intervention.  Wants to see and talk with her surgeon first.  Follow.        Peripheral vascular disease ([place])    Followed by Dr Lucky Cowboy.  S/p stent placement.  Continue risk factor modification.       Relevant Medications   carvedilol (COREG) 6.25 MG tablet   Stress    Feels handling stress well.  Follow.        Other Visit Diagnoses   None.    I spent 25 minutes with the patient and more than 50% of the time was spent in consultation regarding the above.      Einar Pheasant, MD

## 2016-02-12 NOTE — Progress Notes (Addendum)
Subjective:   Lindsey Bentley is a 70 y.o. female who presents for Medicare Annual (Subsequent) preventive examination.  Review of Systems:  No ROS.  Medicare Wellness Visit.  Cardiac Risk Factors include: advanced age (>32mn, >>35women);hypertension     Objective:     Vitals: BP (!) 178/80   Pulse 76   Temp 98.6 F (37 C) (Oral)   Ht '5\' 5"'$  (1.651 m)   Wt 203 lb 3.2 oz (92.2 kg)   SpO2 95%   BMI 33.81 kg/m   Body mass index is 33.81 kg/m.   Tobacco History  Smoking Status  . Never Smoker  Smokeless Tobacco  . Never Used     Counseling given: Not Answered   Past Medical History:  Diagnosis Date  . Arthritis   . Cancer (HCC)    Breast cancer - TI, NO, MO - IDC, ER/PR pos, Her 2 neg  . Carotid artery occlusion   . 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 Coronary artery disease   . Depression   . Diffuse cystic mastopathy 2013  . Elevated TSH   . Family history of adverse reaction to anesthesia    Pt stated that son had a seizure with a combination of anesthesia and pain medication."  . GERD (gastroesophageal reflux disease)   . Glaucoma   . High cholesterol   . History of chicken pox   . Hyperglycemia   . Hyperlipidemia   . Hypertension   . [place]   . Pneumonia March 2014  . Sleep apnea    wears CPAP set at 2.5  . Wears glasses    Past Surgical History:  Procedure Laterality Date  . BACK SURGERY  1986   ruptured disc  . BREAST BIOPSY  2008  . BREAST BIOPSY Left 08-20-14  . BREAST SURGERY Left 09-05-14   left breast lumpectomy with SLN biopsy, ER/PR positive. HER2 negative.  . CHOLECYSTECTOMY  2006  . COLONOSCOPY  2010   Dr. SJamal Collin . DILATION AND CURETTAGE OF UTERUS    . Leg stent  2011  . MOLE REMOVAL  2013   15 removed  . POSTERIOR CERVICAL LAMINECTOMY Left 10/15/2015   Procedure: Left Cervical four- five Hemilaminectomy/Remove mass;  Surgeon: ELeeroy Cha MD;  [facility];   Service: Neurosurgery;  Laterality: Left;  Left C4-5 Hemilaminectomy/Remove mass   Family History  Problem Relation Age of Onset  . Cancer Father     Prostate  . Diabetes Father   . Cerebrovascular Accident Father   . Hypertension Mother   . AAA (abdominal aortic aneurysm) Mother   . Hyperlipidemia Other     Parent  . Miscarriages / Stillbirths Other     Parent  . Hypertension Other     parent  . Heart disease Other     Parent  . Breast cancer Maternal Aunt    History  Sexual Activity  . Sexual activity: No    Outpatient Encounter Prescriptions as of 02/12/2016  Medication Sig  . aspirin (BAYER ASPIRIN) 325 MG tablet Take 325 mg by mouth daily.  . Cholecalciferol (VITAMIN D-1000 MAX ST) 1000 units tablet Take 1,000 Units by mouth daily.   .Marland Kitchenletrozole (FEMARA) 2.5 MG tablet TAKE 1 TABLET BY MOUTH EVERY DAY  . losartan-hydrochlorothiazide (HYZAAR) 100-25 MG tablet TAKE 1 TABLET BY MOUTH EVERY DAY  . rosuvastatin (CRESTOR) 10 MG tablet TAKE 1 TABLET(10 MG) BY MOUTH DAILY  .  vitamin E 400 UNIT capsule Take 400 Units by mouth daily.  . [DISCONTINUED] carvedilol (COREG) 3.125 MG tablet Take 3.125 mg by mouth 2 (two) times daily with a meal.   . carvedilol (COREG) 6.25 MG tablet Take 1 tablet (6.25 mg total) by mouth 2 (two) times daily with a meal.   No facility-administered encounter medications on file as of 02/12/2016.     Activities of Daily Living In your present state of health, do you have any difficulty performing the following activities: 02/12/2016 10/16/2015  Hearing? N N  Vision? N N  Difficulty concentrating or making decisions? N N  Walking or climbing stairs? N Y  Dressing or bathing? N N  Doing errands, shopping? N N  Preparing Food and eating ? N -  Using the Toilet? N -  In the past six months, have you accidently leaked urine? N -  Do you have problems with loss of bowel control? N -  Managing your Medications? N -  Managing your Finances? N -    Housekeeping or managing your Housekeeping? N -  Some recent data might be hidden    Patient Care Team: Einar Pheasant, MD as PCP - General (Internal Medicine) Seeplaputhur Robinette Haines, MD (General Surgery) Einar Pheasant, MD (Internal Medicine)    Assessment:    This is a routine wellness examination for [name]. The goal of the wellness visit is to assist the patient how to close the gaps in care and create a preventative care plan for the patient.   Taking calcium VIT D as appropriate/Osteoporosis risk reviewed.  Medications reviewed; taking without issues or barriers.  Safety issues reviewed; lives with husband.  Smoke detectors in the home. No firearms in the home. Wears seatbelts when driving or riding with others. No violence in the home.  No identified risk were noted; The patient was oriented x 3; appropriate in dress and manner and no objective failures at ADL's or IADL's.   Body mass index; discussed the importance of a healthy diet, water intake and exercise. Educational material provided.  Prevnar 1 and TDAP vaccine; postponed per patient request.  Follow up with insurance and to discuss with PCP.  Hepatitis C Screening; postponed per patient request.    Colonoscopy; She reports she is following up with Dr. Jamal Collin and will have results forwarded.  Patient Concerns: None at this time. Follow up with PCP as needed.  Exercise Activities and Dietary recommendations Current Exercise Habits: Home exercise routine (PT exercises at home), Intensity: Mild  Goals    . Increase physical activity          Stay active and continue PT exercises at home.  Add chair exercises as demonstrated, as tolerated.      Fall Risk Fall Risk  02/12/2016 07/19/2015 05/23/2015 11/15/2014 09/19/2014  Falls in the past year? No No No No No   Depression Screen PHQ 2/9 Scores 02/12/2016 07/19/2015 05/23/2015 11/15/2014  PHQ - 2 Score 0 0 0 0  PHQ- 9 Score - - - -     Cognitive  Testing MMSE - Mini Mental State Exam 02/12/2016  Orientation to time 5  Orientation to Place 5  Registration 3  Attention/ Calculation 5  Recall 2  Language- name 2 objects 2  Language- repeat 1  Language- follow 3 step command 3  Language- read & follow direction 1  Write a sentence 1  Copy design 1  Total score 29    Immunization History  Administered Date(s) Administered  .  Pneumococcal Polysaccharide-23 07/26/2012  . Td 05/11/2004   Screening Tests Health Maintenance  Topic Date Due  . Hepatitis C Screening  Sep 15, 1945  . PNA vac Low Risk Adult (2 of 2 - PCV13) 07/26/2013  . TETANUS/TDAP  05/11/2014  . COLONOSCOPY  05/12/2015  . INFLUENZA VACCINE  05/07/2016 (Originally 12/10/2015)  . ZOSTAVAX  07/11/2023 (Originally 08/13/2004)  . MAMMOGRAM  08/12/2016  . DEXA SCAN  Completed      Plan:    End of life planning; Advance aging; Advanced directives discussed. No HCPOA/Living Will.  Additional information declined at this time.  Follow up with PCP as needed.  Medicare Attestation I have personally reviewed: The patient's medical and social history Their use of alcohol, tobacco or illicit drugs Their current medications and supplements The patient's functional ability including ADLs,fall risks, home safety risks, cognitive, and hearing and visual impairment Diet and physical activities Evidence for depression   The patient's weight, height, BMI, and visual acuity have been recorded in the chart.  I have made referrals and provided education to the patient based on review of the above and I have provided the patient with a written personalized care plan for preventive services.     During the course of the visit the patient was educated and counseled about the following appropriate screening and preventive services:   Vaccines to include Pneumoccal, Influenza, Hepatitis B, Td, Zostavax, HCV  Electrocardiogram  Cardiovascular Disease  Colorectal cancer  screening  Bone density screening  Diabetes screening  Glaucoma screening  Mammography/PAP  Nutrition counseling   Patient Instructions (the written plan) was given to the patient.   Varney Biles, LPN  89/11/9148   Reviewed above.  Agree with plan.  Dr Nicki Reaper

## 2016-02-12 NOTE — Progress Notes (Signed)
Pre visit review using our clinic review tool, if applicable. No additional management support is needed unless otherwise documented below in the visit note. 

## 2016-02-12 NOTE — Patient Instructions (Addendum)
  Lindsey Bentley , Thank you for taking time to come for your Medicare Wellness Visit. I appreciate your ongoing commitment to your health goals. Please review the following plan we discussed and let me know if I can assist you in the future.   These are the goals we discussed: Goals    . Increase physical activity          Stay active and continue PT exercises at home.  Add chair exercises as demonstrated, as tolerated.       This is a list of the screening recommended for you and due dates:  Health Maintenance  Topic Date Due  .  Hepatitis C: One time screening is recommended by Center for Disease Control  (CDC) for  adults born from [birth year] through 1966.   December 06, 1945  . Pneumonia vaccines (2 of 2 - PCV13) 07/26/2013  . Tetanus Vaccine  05/11/2014  . Colon Cancer Screening  05/12/2015  . Flu Shot  05/07/2016*  . Shingles Vaccine  07/11/2023*  . Mammogram  08/12/2016  . DEXA scan (bone density measurement)  Completed  *Topic was postponed. The date shown is not the original due date.     Increase carvedilol to 6.25mg  twice a day.

## 2016-02-13 ENCOUNTER — Encounter: Payer: Self-pay | Admitting: Internal Medicine

## 2016-02-17 ENCOUNTER — Encounter: Payer: Self-pay | Admitting: Internal Medicine

## 2016-02-17 NOTE — Assessment & Plan Note (Signed)
Feels handling stress well.  Follow.

## 2016-02-17 NOTE — Assessment & Plan Note (Signed)
On crestor.  Low cholesterol diet and exercise.  Follow lipid panel and liver function tests.   

## 2016-02-17 NOTE — Assessment & Plan Note (Signed)
No upper symptoms reported.  Follow.   

## 2016-02-17 NOTE — Assessment & Plan Note (Signed)
S/p neck surgery.  Neck pain improved. She is having numbness in her fingers/finger tips.  Discussed wearing splint.  She feels is related to her neck.  Desires no further testing or intervention.  Wants to see and talk with her surgeon first.  Follow.

## 2016-02-17 NOTE — Assessment & Plan Note (Signed)
Blood pressure remains elevated.  Increased carvedilol to 6/25mg  bid.  Follow pressures.  Get her back in soon to reassess.

## 2016-02-17 NOTE — Assessment & Plan Note (Signed)
Low carb diet and exercise.  Follow met b and a1c.  

## 2016-02-17 NOTE — Assessment & Plan Note (Signed)
On femara.  Recent pain - resolved.  Saw Dr Jamal Collin.  Follow.

## 2016-02-17 NOTE — Assessment & Plan Note (Signed)
Followed by Dr Lucky Cowboy.  S/p stent placement.  Continue risk factor modification.

## 2016-02-24 ENCOUNTER — Other Ambulatory Visit (INDEPENDENT_AMBULATORY_CARE_PROVIDER_SITE_OTHER): Payer: Medicare Other

## 2016-02-24 DIAGNOSIS — R739 Hyperglycemia, unspecified: Secondary | ICD-10-CM | POA: Diagnosis not present

## 2016-02-24 DIAGNOSIS — E7800 Pure hypercholesterolemia, unspecified: Secondary | ICD-10-CM

## 2016-02-24 DIAGNOSIS — I10 Essential (primary) hypertension: Secondary | ICD-10-CM

## 2016-02-24 LAB — BASIC METABOLIC PANEL
BUN: 20 mg/dL (ref 6–23)
CALCIUM: 9.6 mg/dL (ref 8.4–10.5)
CHLORIDE: 105 meq/L (ref 96–112)
CO2: 26 meq/L (ref 19–32)
Creatinine, Ser: 0.85 mg/dL (ref 0.40–1.20)
GFR: 69.97 mL/min (ref >60.00)
GLUCOSE: 106 mg/dL — AB (ref 70–99)
Potassium: 4.2 mEq/L (ref 3.5–5.1)
SODIUM: 140 meq/L (ref 135–145)

## 2016-02-24 LAB — LIPID PANEL
CHOL/HDL RATIO: 4
Cholesterol: 167 mg/dL (ref 0–200)
HDL: 40.6 mg/dL (ref >39.00)
NONHDL: 126.46
TRIGLYCERIDES: 208 mg/dL — AB (ref 0.0–149.0)
VLDL: 41.6 mg/dL — AB (ref 0.0–40.0)

## 2016-02-24 LAB — HEPATIC FUNCTION PANEL
ALBUMIN: 4.4 g/dL (ref 3.5–5.2)
ALT: 29 U/L (ref 0–35)
AST: 20 U/L (ref 0–37)
Alkaline Phosphatase: 51 U/L (ref 39–117)
Bilirubin, Direct: 0.1 mg/dL (ref 0.0–0.3)
TOTAL PROTEIN: 7.2 g/dL (ref 6.0–8.3)
Total Bilirubin: 0.5 mg/dL (ref 0.2–1.2)

## 2016-02-24 LAB — HEMOGLOBIN A1C: Hgb A1c MFr Bld: 6.5 % (ref 4.6–6.5)

## 2016-02-24 LAB — LDL CHOLESTEROL, DIRECT: LDL DIRECT: 103 mg/dL

## 2016-02-26 ENCOUNTER — Telehealth: Payer: Self-pay

## 2016-02-26 ENCOUNTER — Other Ambulatory Visit: Payer: Self-pay | Admitting: General Surgery

## 2016-02-26 NOTE — Telephone Encounter (Signed)
Blood pressure better on recheck today.  Monitor salt intake (decrease sodium intake).  Spot check pressures - 2-3x/week.  Call if problems.

## 2016-02-26 NOTE — Telephone Encounter (Signed)
Duplicate.  See attached.   

## 2016-02-26 NOTE — Telephone Encounter (Signed)
-----   Message from Einar Pheasant, MD sent at 02/26/2016  4:46 AM EDT ----- Reviewed note.  Need to know how her blood pressures are doing.  What are they running/averaging.

## 2016-02-26 NOTE — Telephone Encounter (Signed)
Please advise 

## 2016-02-26 NOTE — Telephone Encounter (Signed)
Patient states they have been running in the 150s and 160s, this morning it is 171/88

## 2016-02-26 NOTE — Telephone Encounter (Signed)
Pt called back and stated that she just took her bp again and it was135/68.   Call pt [phone]

## 2016-02-26 NOTE — Telephone Encounter (Signed)
Patient notified

## 2016-02-28 ENCOUNTER — Other Ambulatory Visit: Payer: Self-pay | Admitting: General Surgery

## 2016-02-28 ENCOUNTER — Ambulatory Visit
Admission: RE | Admit: 2016-02-28 | Discharge: 2016-02-28 | Disposition: A | Discharge status: Home / Self Care | Payer: Medicare Other | Source: Ambulatory Visit | Attending: General Surgery | Admitting: General Surgery

## 2016-02-28 DIAGNOSIS — C50512 Malignant neoplasm of lower-outer quadrant of left female breast: Secondary | ICD-10-CM | POA: Diagnosis not present

## 2016-02-28 DIAGNOSIS — R921 Mammographic calcification found on diagnostic imaging of breast: Secondary | ICD-10-CM | POA: Diagnosis not present

## 2016-02-28 HISTORY — DX: Unspecified malignant neoplasm of skin, unspecified: C44.90

## 2016-02-28 HISTORY — DX: Personal history of irradiation: Z92.3

## 2016-03-02 DIAGNOSIS — G5603 Carpal tunnel syndrome, bilateral upper limbs: Secondary | ICD-10-CM | POA: Diagnosis not present

## 2016-03-04 ENCOUNTER — Encounter: Payer: Self-pay | Admitting: *Deleted

## 2016-03-10 ENCOUNTER — Ambulatory Visit (INDEPENDENT_AMBULATORY_CARE_PROVIDER_SITE_OTHER): Payer: Medicare Other | Admitting: General Surgery

## 2016-03-10 VITALS — BP 134/78 | HR 82 | Resp 14 | Ht 65.0 in | Wt 202.0 lb

## 2016-03-10 DIAGNOSIS — Z853 Personal history of malignant neoplasm of breast: Secondary | ICD-10-CM | POA: Diagnosis not present

## 2016-03-10 DIAGNOSIS — G4733 Obstructive sleep apnea (adult) (pediatric): Secondary | ICD-10-CM | POA: Diagnosis not present

## 2016-03-10 NOTE — Progress Notes (Signed)
[name], female   DOB: [birth year], 70 y.o.   MRN: [id number]  Chief Complaint  Patient presents with  . Follow-up    mammogram    HPI Lindsey Bentley is a 70 y.o. female.  who presents for a 6 month breast cancer follow up. The most recent left mammogram was done on 02-28-16. She does complain of some tenderness left breast Patient does perform regular self breast checks and gets regular mammograms done.  Tolerating Femara, she states the hot flashes have improved. She states her son still has complications post gastric bypass last year. I have reviewed the history of present illness with the patient.   HPI  Past Medical History:  Diagnosis Date  . Arthritis   . Breast cancer ([place]) 2016   LT LUMPECTOMY - TI, NO, MO - IDC, ER/PR pos, Her 2 neg  . Carotid artery occlusion   . 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 Coronary artery disease   . Depression   . Diffuse cystic mastopathy 2013  . Elevated TSH   . Family history of adverse reaction to anesthesia    Pt stated that son had a seizure with a combination of anesthesia and pain medication."  . GERD (gastroesophageal reflux disease)   . Glaucoma   . High cholesterol   . History of chicken pox   . Hyperglycemia   . Hyperlipidemia   . Hypertension   . Peripheral vascular disease ([place])   . Personal history of radiation therapy 2016   BREAST CA  . Pneumonia March 2014  . Skin cancer 2013  . Sleep apnea    wears CPAP set at 2.5  . Wears glasses     Past Surgical History:  Procedure Laterality Date  . BACK SURGERY  1986   ruptured disc  . BREAST BIOPSY Left 2008   NEG  . BREAST BIOPSY Left 08-20-14   POS  . BREAST BIOPSY Right 2007   NEG  . BREAST EXCISIONAL BIOPSY Left 1984   NEG  . BREAST SURGERY Left 09-05-14   left breast lumpectomy with SLN biopsy, ER/PR positive. HER2 negative.  . CHOLECYSTECTOMY  2006  . COLONOSCOPY  2010   Dr. Jamal Collin  . DILATION AND  CURETTAGE OF UTERUS    . Leg stent  2011  . MOLE REMOVAL  2013   15 removed  . POSTERIOR CERVICAL LAMINECTOMY Left 10/15/2015   Procedure: Left Cervical four- five Hemilaminectomy/Remove mass;  Surgeon: Leeroy Cha, MD;  Location: [facility];  Service: Neurosurgery;  Laterality: Left;  Left C4-5 Hemilaminectomy/Remove mass    Family History  Problem Relation Age of Onset  . Cancer Father     Prostate  . Diabetes Father   . Cerebrovascular Accident Father   . Hypertension Mother   . AAA (abdominal aortic aneurysm) Mother   . Hyperlipidemia Other     Parent  . Miscarriages / Stillbirths Other     Parent  . Hypertension Other     parent  . Heart disease Other     Parent  . Breast cancer Maternal Aunt 72    Social History Social History  Substance Use Topics  . Smoking status: Never Smoker  . Smokeless tobacco: Never Used  . Alcohol use No    Allergies  Allergen Reactions  . Codeine Nausea And Vomiting  . Adhesive [Tape] Itching and Rash    Please use "paper" tape  . Prednisone Swelling  .  Statins Other (See Comments)    Muscle Pain, can take Crestor    Current Outpatient Prescriptions  Medication Sig Dispense Refill  . aspirin (BAYER ASPIRIN) 325 MG tablet Take 325 mg by mouth daily.    . carvedilol (COREG) 6.25 MG tablet Take 1 tablet (6.25 mg total) by mouth 2 (two) times daily with a meal. 60 tablet 3  . Cholecalciferol (VITAMIN D-1000 MAX ST) 1000 units tablet Take 1,000 Units by mouth daily.     . letrozole (FEMARA) 2.5 MG tablet TAKE 1 TABLET BY MOUTH EVERY DAY 30 tablet 11  . losartan-hydrochlorothiazide (HYZAAR) 100-25 MG tablet TAKE 1 TABLET BY MOUTH EVERY DAY 90 tablet 3  . rosuvastatin (CRESTOR) 10 MG tablet TAKE 1 TABLET(10 MG) BY MOUTH DAILY 30 tablet 11  . vitamin E 400 UNIT capsule Take 400 Units by mouth daily.     No current facility-administered medications for this visit.     Review of Systems Review of Systems  Constitutional: Negative.    Respiratory: Negative.   Cardiovascular: Negative.   Neurological: Positive for dizziness.    Blood pressure 134/78, pulse 82, resp. rate 14, height 5' 5" (1.651 m), weight 202 lb (91.6 kg).  Physical Exam Physical Exam  Constitutional: She is oriented to person, place, and time. She appears well-developed and well-nourished.  HENT:  Mouth/Throat: Oropharynx is clear and moist.  Eyes: Conjunctivae are normal. No scleral icterus.  Neck: Neck supple.  Cardiovascular: Normal rate, regular rhythm and normal heart sounds.   Pulmonary/Chest: Effort normal and breath sounds normal. Right breast exhibits no inverted nipple, no mass, no nipple discharge, no skin change and no tenderness. Left breast exhibits tenderness. Left breast exhibits no inverted nipple, no mass, no nipple discharge and no skin change.    Lymphadenopathy:    She has no cervical adenopathy.    She has no axillary adenopathy.  Neurological: She is alert and oriented to person, place, and time.  Skin: Skin is warm and dry.  Psychiatric: Her behavior is normal.    Data Reviewed Mammogram reviewed Radiologist notes some new calcifications just lateral to lumpectomy cavity. Suggected biopsy. These xrays were reviewed by me and also my associate DR. Byrnett. Likely these represent fat necrosis. It is reasonable to follow closely.    Assessment    CA, left breast lower outer quadrant, T1,N0,ER/PR pos,Her 2 neg.  She is 18 months post lumpectomy and radiation. Currently on letrozole and doing well.   Mammographic findings were explained to pt and she is OK with 6 mo follow up.     Plan    Patient to have a bilateral diagnostic mammogram follow up in 6 months.      This information has been scribed by Marsha Hatch RN, BSN,BC.   SANKAR,SEEPLAPUTHUR G 03/11/2016, 1:35 PM   

## 2016-03-10 NOTE — Patient Instructions (Addendum)
The patient is aware to call back for any questions or concerns. Patient to have a bilateral diagnostic mammogram follow up in 6 months.  

## 2016-03-11 ENCOUNTER — Encounter: Payer: Self-pay | Admitting: General Surgery

## 2016-04-07 ENCOUNTER — Ambulatory Visit (INDEPENDENT_AMBULATORY_CARE_PROVIDER_SITE_OTHER): Payer: Medicare Other | Admitting: Internal Medicine

## 2016-04-07 ENCOUNTER — Other Ambulatory Visit (INDEPENDENT_AMBULATORY_CARE_PROVIDER_SITE_OTHER): Payer: Self-pay | Admitting: Vascular Surgery

## 2016-04-07 ENCOUNTER — Encounter: Payer: Self-pay | Admitting: Internal Medicine

## 2016-04-07 DIAGNOSIS — I739 Peripheral vascular disease, unspecified: Secondary | ICD-10-CM

## 2016-04-07 DIAGNOSIS — R7989 Other specified abnormal findings of blood chemistry: Secondary | ICD-10-CM

## 2016-04-07 DIAGNOSIS — C50912 Malignant neoplasm of unspecified site of left female breast: Secondary | ICD-10-CM

## 2016-04-07 DIAGNOSIS — F439 Reaction to severe stress, unspecified: Secondary | ICD-10-CM

## 2016-04-07 DIAGNOSIS — I10 Essential (primary) hypertension: Secondary | ICD-10-CM

## 2016-04-07 DIAGNOSIS — I25119 Atherosclerotic heart disease of native coronary artery with unspecified angina pectoris: Secondary | ICD-10-CM

## 2016-04-07 DIAGNOSIS — I6523 Occlusion and stenosis of bilateral carotid arteries: Secondary | ICD-10-CM

## 2016-04-07 DIAGNOSIS — G5601 Carpal tunnel syndrome, right upper limb: Secondary | ICD-10-CM

## 2016-04-07 DIAGNOSIS — Z9889 Other specified postprocedural states: Secondary | ICD-10-CM

## 2016-04-07 DIAGNOSIS — R945 Abnormal results of liver function studies: Secondary | ICD-10-CM

## 2016-04-07 DIAGNOSIS — R739 Hyperglycemia, unspecified: Secondary | ICD-10-CM

## 2016-04-07 DIAGNOSIS — E7800 Pure hypercholesterolemia, unspecified: Secondary | ICD-10-CM

## 2016-04-07 MED ORDER — AMLODIPINE BESYLATE 5 MG PO TABS: 5.0000 mg | ORAL_TABLET | Freq: Every day | ORAL | 1 refills | Status: DC

## 2016-04-07 NOTE — Progress Notes (Signed)
Pre visit review using our clinic review tool, if applicable. No additional management support is needed unless otherwise documented below in the visit note. 

## 2016-04-07 NOTE — Progress Notes (Signed)
[name], female   DOB: Jun 22, 1945, 70 y.o.   MRN: [id number]   Subjective:    [name], female    DOB: [birth year], 70 y.o.   MRN: [id number]  HPI  Patient here for a scheduled follow up.  She is having increased hand and arm pain.  Diagnosed with severe carpal tunnel.  Seeing neurosurgery.  Has f/u Thursday to discuss other treatment options.  States if it was not for her hand and arm pain, then she would be fine.  Blood pressure still elevated.  No chest pain.  No sob.  No abdominal pain or cramping.  Bowels stable.     Past Medical History:  Diagnosis Date  . Arthritis   . Breast cancer ([place]) 2016   LT LUMPECTOMY - TI, NO, MO - IDC, ER/PR pos, Her 2 neg  . Carotid artery occlusion   . 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 Coronary artery disease   . Depression   . Diffuse cystic mastopathy 2013  . Elevated TSH   . Family history of adverse reaction to anesthesia    Pt stated that son had a seizure with a combination of anesthesia and pain medication."  . GERD (gastroesophageal reflux disease)   . Glaucoma   . High cholesterol   . History of chicken pox   . Hyperglycemia   . Hyperlipidemia   . Hypertension   . Peripheral vascular disease ([place])   . Personal history of radiation therapy 2016   BREAST CA  . Pneumonia March 2014  . Skin cancer 2013  . Sleep apnea    wears CPAP set at 2.5  . Wears glasses    Past Surgical History:  Procedure Laterality Date  . BACK SURGERY  1986   ruptured disc  . BREAST BIOPSY Left 2008   NEG  . BREAST BIOPSY Left 08-20-14   POS  . BREAST BIOPSY Right 2007   NEG  . BREAST EXCISIONAL BIOPSY Left 1984   NEG  . BREAST SURGERY Left 09-05-14   left breast lumpectomy with SLN biopsy, ER/PR positive. HER2 negative.  . CHOLECYSTECTOMY  2006  . COLONOSCOPY  2010   Dr. Jamal Collin  . DILATION AND CURETTAGE OF UTERUS    . Leg stent  2011  . MOLE REMOVAL  2013   15 removed  .  POSTERIOR CERVICAL LAMINECTOMY Left 10/15/2015   Procedure: Left Cervical four- five Hemilaminectomy/Remove mass;  Surgeon: Leeroy Cha, MD;  Location: [place] [facility];  Service: Neurosurgery;  Laterality: Left;  Left C4-5 Hemilaminectomy/Remove mass   Family History  Problem Relation Age of Onset  . Cancer Father     Prostate  . Diabetes Father   . Cerebrovascular Accident Father   . Hypertension Mother   . AAA (abdominal aortic aneurysm) Mother   . Hyperlipidemia Other     Parent  . Miscarriages / Stillbirths Other     Parent  . Hypertension Other     parent  . Heart disease Other     Parent  . Breast cancer Maternal Aunt 72   Social History   Social History  . Marital status: Married    Spouse name: N/A  . Number of children: 3  . Years of education: 12   Occupational History  . Caregiver     Social History Main Topics  . Smoking status: Never Smoker  . Smokeless tobacco: Never Used  . Alcohol use No  .  Drug use: No  . Sexual activity: No   Other Topics Concern  . None   Social History Narrative   Regular exercise-mo   Caffeine Use-no    Outpatient Encounter Prescriptions as of 04/07/2016  Medication Sig  . aspirin (BAYER ASPIRIN) 325 MG tablet Take 325 mg by mouth daily.  . carvedilol (COREG) 6.25 MG tablet Take 1 tablet (6.25 mg total) by mouth 2 (two) times daily with a meal.  . Cholecalciferol (VITAMIN D-1000 MAX ST) 1000 units tablet Take 1,000 Units by mouth daily.   [name] letrozole (FEMARA) 2.5 MG tablet TAKE 1 TABLET BY MOUTH EVERY DAY  . losartan-hydrochlorothiazide (HYZAAR) 100-25 MG tablet TAKE 1 TABLET BY MOUTH EVERY DAY  . rosuvastatin (CRESTOR) 10 MG tablet TAKE 1 TABLET(10 MG) BY MOUTH DAILY  . vitamin E 400 UNIT capsule Take 400 Units by mouth daily.  [name] amLODipine (NORVASC) 5 MG tablet Take 1 tablet (5 mg total) by mouth daily.   No facility-administered encounter medications on file as of 04/07/2016.     Review of Systems  Constitutional:  Negative for appetite change and unexpected weight change.  HENT: Negative for congestion and sinus pressure.   Respiratory: Negative for cough, chest tightness and shortness of breath.   Cardiovascular: Negative for chest pain, palpitations and leg swelling.  Gastrointestinal: Negative for abdominal pain, diarrhea, nausea and vomiting.  Genitourinary: Negative for difficulty urinating and dysuria.  Musculoskeletal:       Right hand and arm pain as outlined.    Skin: Negative for color change and rash.  Neurological: Negative for dizziness, light-headedness and headaches.  Psychiatric/Behavioral: Negative for agitation and dysphoric mood.       Objective:    Physical Exam  Constitutional: She appears well-developed and well-nourished. No distress.  HENT:  Nose: Nose normal.  Mouth/Throat: Oropharynx is clear and moist.  Neck: Neck supple. No thyromegaly present.  Cardiovascular: Normal rate and regular rhythm.   Pulmonary/Chest: Breath sounds normal. No respiratory distress. She has no wheezes.  Abdominal: Soft. Bowel sounds are normal. There is no tenderness.  Musculoskeletal: She exhibits no edema or tenderness.  Lymphadenopathy:    She has no cervical adenopathy.  Skin: No rash noted. No erythema.  Psychiatric: She has a normal mood and affect. Her behavior is normal.    BP (!) 160/72   Pulse 68   Temp 98.4 F (36.9 C) (Oral)   Ht 5\' 5"  (1.651 m)   Wt 204 lb 12.8 oz (92.9 kg)   SpO2 96%   BMI 34.08 kg/m  Wt Readings from Last 3 Encounters:  04/07/16 204 lb 12.8 oz (92.9 kg)  03/10/16 202 lb (91.6 kg)  02/12/16 203 lb 3.2 oz (92.2 kg)     Lab Results  Component Value Date   WBC 6.3 10/10/2015   HGB 12.9 10/10/2015   HCT 38.2 10/10/2015   PLT 208 10/10/2015   GLUCOSE 106 (H) 02/24/2016   CHOL 167 02/24/2016   TRIG 208.0 (H) 02/24/2016   HDL 40.60 02/24/2016   LDLDIRECT 103.0 02/24/2016   LDLCALC 108 (H) 01/01/2015   ALT 29 02/24/2016   AST 20 02/24/2016     NA 140 02/24/2016   K 4.2 02/24/2016   CL 105 02/24/2016   CREATININE 0.85 02/24/2016   BUN 20 02/24/2016   CO2 26 02/24/2016   TSH 3.00 11/20/2015   INR 0.9 07/13/2012   HGBA1C 6.5 02/24/2016    Mm Diag Breast Tomo Uni Left  Result Date: 02/28/2016 CLINICAL  DATA:  Patient status post left breast lumpectomy. EXAM: 2D DIGITAL DIAGNOSTIC UNILATERAL LEFT MAMMOGRAM WITH CAD AND ADJUNCT TOMO COMPARISON:  Previous exam(s). ACR Breast Density Category c: The breast tissue is heterogeneously dense, which may obscure small masses. FINDINGS: Postlumpectomy changes are demonstrated within the lateral left breast posterior depth. Along the lateral margin of the lumpectomy site there is a new 7 mm group of linearly oriented coarse heterogeneous calcifications. No additional concerning masses, calcifications or areas of nonsurgical architectural distortion identified within the left breast. Mammographic images were processed with CAD. IMPRESSION: New coarse heterogeneous calcifications along the lateral margin of the lumpectomy site. These may potentially represent an area of fat necrosis however have a somewhat similar appearance to the initially diagnosed DCIS and therefore stereotactic guided core needle biopsy is recommended for definitive characterization. RECOMMENDATION: Stereotactic guided core needle biopsy indeterminate left breast calcifications along the lateral margin of the lumpectomy site. I have discussed the findings and recommendations with the patient. Results were also provided in writing at the conclusion of the visit. If applicable, a reminder letter will be sent to the patient regarding the next appointment. BI-RADS CATEGORY  4: Suspicious. Electronically Signed   By: Lovey Newcomer M.D.   On: 02/28/2016 15:55       Assessment & Plan:   Problem List Items Addressed This Visit    Abnormal liver function test    Follow liver panel.        Breast cancer [facility])    Evaluated by Dr Jamal Collin  03/2016.  Had mammogram 02/2016.  New calcifications.  After review by surgery, recommended f/u in 6 months.        CAD (coronary artery disease)    Has known CAD.  Followed by cardiology.  Stable.  Get blood pressure under better control.  Add amlodipine.  Follow.       Relevant Medications   amLODipine (NORVASC) 5 MG tablet   Carpal tunnel syndrome    Increased pain.  Due to f/u with neurosurgery in a couple of days.  Will discuss with them regarding further treatment options.        Hypercholesterolemia    On crestor.  Low cholesterol diet and exercise.  Follow lipid panel and liver function tests.        Relevant Medications   amLODipine (NORVASC) 5 MG tablet   Hyperglycemia    Low carb diet and exercise.  Follow met b and a1c.        Hypertension    Persistent elevation.  Add amlodipine 38m q day.  Follow pressures.  Follow metabolic panel.  Get her back in soon to reassess.        Relevant Medications   amLODipine (NORVASC) 5 MG tablet   Peripheral vascular disease ([place]    S/p stent placement.  Continue risk factor modification.  Followed by Dr DLucky Cowboy        Relevant Medications   amLODipine (NORVASC) 5 MG tablet   S/P spinal surgery    Followed by neurosurgery.  Neck is doing well.  Follow.        Stress    Handling stress relatively well.  Follow.            SEinar Pheasant MD

## 2016-04-09 DIAGNOSIS — G5603 Carpal tunnel syndrome, bilateral upper limbs: Secondary | ICD-10-CM | POA: Diagnosis not present

## 2016-04-09 DIAGNOSIS — I10 Essential (primary) hypertension: Secondary | ICD-10-CM | POA: Diagnosis not present

## 2016-04-12 ENCOUNTER — Encounter: Payer: Self-pay | Admitting: Internal Medicine

## 2016-04-12 DIAGNOSIS — G5600 Carpal tunnel syndrome, unspecified upper limb: Secondary | ICD-10-CM | POA: Insufficient documentation

## 2016-04-12 NOTE — Assessment & Plan Note (Signed)
Follow liver panel.  

## 2016-04-12 NOTE — Assessment & Plan Note (Signed)
Increased pain.  Due to f/u with neurosurgery in a couple of days.  Will discuss with them regarding further treatment options.

## 2016-04-12 NOTE — Assessment & Plan Note (Signed)
Handling stress relatively well.  Follow.

## 2016-04-12 NOTE — Assessment & Plan Note (Signed)
Evaluated by Dr Jamal Collin 03/2016.  Had mammogram 02/2016.  New calcifications.  After review by surgery, recommended f/u in 6 months.

## 2016-04-12 NOTE — Assessment & Plan Note (Signed)
Low carb diet and exercise.  Follow met b and a1c.   

## 2016-04-12 NOTE — Assessment & Plan Note (Signed)
On crestor.  Low cholesterol diet and exercise.  Follow lipid panel and liver function tests.   

## 2016-04-12 NOTE — Assessment & Plan Note (Signed)
Persistent elevation.  Add amlodipine 5mg  q day.  Follow pressures.  Follow metabolic panel.  Get her back in soon to reassess.

## 2016-04-12 NOTE — Assessment & Plan Note (Signed)
Followed by neurosurgery.  Neck is doing well.  Follow.

## 2016-04-12 NOTE — Assessment & Plan Note (Signed)
Has known CAD.  Followed by cardiology.  Stable.  Get blood pressure under better control.  Add amlodipine.  Follow.

## 2016-04-12 NOTE — Assessment & Plan Note (Signed)
S/p stent placement.  Continue risk factor modification.  Followed by Dr Lucky Cowboy.

## 2016-04-14 ENCOUNTER — Encounter (INDEPENDENT_AMBULATORY_CARE_PROVIDER_SITE_OTHER): Payer: Self-pay | Admitting: Vascular Surgery

## 2016-04-14 ENCOUNTER — Ambulatory Visit (INDEPENDENT_AMBULATORY_CARE_PROVIDER_SITE_OTHER): Payer: Medicare Other | Admitting: Vascular Surgery

## 2016-04-14 ENCOUNTER — Ambulatory Visit (INDEPENDENT_AMBULATORY_CARE_PROVIDER_SITE_OTHER): Payer: Medicare Other

## 2016-04-14 VITALS — BP 180/77 | HR 69 | Resp 16 | Ht 65.0 in | Wt 205.0 lb

## 2016-04-14 DIAGNOSIS — I739 Peripheral vascular disease, unspecified: Secondary | ICD-10-CM

## 2016-04-14 DIAGNOSIS — I10 Essential (primary) hypertension: Secondary | ICD-10-CM | POA: Diagnosis not present

## 2016-04-14 DIAGNOSIS — E7800 Pure hypercholesterolemia, unspecified: Secondary | ICD-10-CM

## 2016-04-14 DIAGNOSIS — I6523 Occlusion and stenosis of bilateral carotid arteries: Secondary | ICD-10-CM | POA: Diagnosis not present

## 2016-04-14 NOTE — Assessment & Plan Note (Signed)
blood pressure control important in reducing the progression of atherosclerotic disease. On appropriate oral medications.  

## 2016-04-14 NOTE — Assessment & Plan Note (Signed)
lipid control important in reducing the progression of atherosclerotic disease. Continue statin therapy  

## 2016-04-14 NOTE — Assessment & Plan Note (Signed)
Previous history of revascularization many years ago. No worrisome symptoms of claudication, ischemic rest pain, or ulceration. ABIs today are normal at 1.1 on the right and 1.2 on the left. Plan to recheck in 2 years with noninvasive studies.

## 2016-04-14 NOTE — Progress Notes (Signed)
MRN : [id number]  Lindsey Bentley is a 70 y.o. (July 13, 1945) female who presents with chief complaint of  Chief Complaint  Patient presents with  . Re-evaluation    2 year Carotid/ABI ultrasound follow up  .  History of Present Illness: Patient returns today in follow-up of multiple vascular issues. Previous history of revascularization many years ago. No worrisome symptoms of claudication, ischemic rest pain, or ulceration. ABIs today are normal at 1.1 on the right and 1.2 on the left. She also is here to get her carotids checked today. 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 She also has carpal tunnel syndrome. Carotid duplex today reveals stable 1-39% carotid artery stenosis bilaterally.  Current Outpatient Prescriptions  Medication Sig Dispense Refill  . amLODipine (NORVASC) 5 MG tablet Take 1 tablet (5 mg total) by mouth daily. 30 tablet 1  . aspirin (BAYER ASPIRIN) 325 MG tablet Take 325 mg by mouth daily.    . carvedilol (COREG) 6.25 MG tablet Take 1 tablet (6.25 mg total) by mouth 2 (two) times daily with a meal. 60 tablet 3  . Cholecalciferol (VITAMIN D-1000 MAX ST) 1000 units tablet Take 1,000 Units by mouth daily.     [name] letrozole (FEMARA) 2.5 MG tablet TAKE 1 TABLET BY MOUTH EVERY DAY 30 tablet 11  . losartan-hydrochlorothiazide (HYZAAR) 100-25 MG tablet TAKE 1 TABLET BY MOUTH EVERY DAY 90 tablet 3  . rosuvastatin (CRESTOR) 10 MG tablet TAKE 1 TABLET(10 MG) BY MOUTH DAILY 30 tablet 11  . vitamin E 400 UNIT capsule Take 400 Units by mouth daily.     No current facility-administered medications for this visit.     Past Medical History:  Diagnosis Date  . Arthritis   . Breast cancer ([place]) 2016   LT LUMPECTOMY - TI, NO, MO - IDC, ER/PR pos, Her 2 neg  . Carotid artery occlusion   . 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 Coronary artery  disease   . Depression   . Diffuse cystic mastopathy 2013  . Elevated TSH   . Family history of adverse reaction to anesthesia    Pt stated that son had a seizure with a combination of anesthesia and pain medication."  . GERD (gastroesophageal reflux disease)   . Glaucoma   . High cholesterol   . History of chicken pox   . Hyperglycemia   . Hyperlipidemia   . Hypertension   . Peripheral vascular disease ([place])   . Personal history of radiation therapy 2016   BREAST CA  . Pneumonia March 2014  . Skin cancer 2013  . Sleep apnea    wears CPAP set at 2.5  . Wears glasses     Past Surgical History:  Procedure Laterality Date  . BACK SURGERY  1986   ruptured disc  . BREAST BIOPSY Left 2008   NEG  . BREAST BIOPSY Left 08-20-14   POS  . BREAST BIOPSY Right 2007   NEG  . BREAST EXCISIONAL BIOPSY Left 1984   NEG  . BREAST SURGERY Left 09-05-14   left breast lumpectomy with SLN biopsy, ER/PR positive. HER2 negative.  . CHOLECYSTECTOMY  2006  . COLONOSCOPY  2010   Dr. Jamal Collin  . DILATION AND CURETTAGE OF UTERUS    . Leg stent  2011  . MOLE REMOVAL  2013   15  removed  . POSTERIOR CERVICAL LAMINECTOMY Left 10/15/2015   Procedure: Left Cervical four- five Hemilaminectomy/Remove mass;  Surgeon: Leeroy Cha, MD;  Location: [place] [facility];  Service: Neurosurgery;  Laterality: Left;  Left C4-5 Hemilaminectomy/Remove mass    Social History Social History  Substance Use Topics  . Smoking status: Never Smoker  . Smokeless tobacco: Never Used  . Alcohol use No  no IVDU  Family History Family History  Problem Relation Age of Onset  . Cancer Father     Prostate  . Diabetes Father   . Cerebrovascular Accident Father   . Hypertension Mother   . AAA (abdominal aortic aneurysm) Mother   . Hyperlipidemia Other     Parent  . Miscarriages / Stillbirths Other     Parent  . Hypertension Other     parent  . Heart disease Other     Parent  . Breast cancer Maternal Aunt 72     Allergies  Allergen Reactions  . Codeine Nausea And Vomiting  . Adhesive [Tape] Itching and Rash    Please use "paper" tape  . Prednisone Swelling  . Statins Other (See Comments)    Muscle Pain, can take Crestor     REVIEW OF SYSTEMS (Negative unless checked)  Constitutional: _0 Weight loss  _1 Fever  _2 Chills Cardiac: _3 Chest pain   _4 Chest pressure   _5 Palpitations   _6 Shortness of breath when laying flat   _7 Shortness of breath at rest   _8 Shortness of breath with exertion. Vascular:  _9 Pain in legs with walking   _10 Pain in legs at rest   _11 Pain in legs when laying flat   _12 Claudication   _13 Pain in feet when walking  _14 Pain in feet at rest  _15 Pain in feet when laying flat   _16 History of DVT   _17 Phlebitis   _18 Swelling in legs   _19 Varicose veins   _20 Non-healing ulcers Pulmonary:   _21 Uses home oxygen   _22 Productive cough   _23 Hemoptysis   _24 Wheeze  _25 COPD   _26 Asthma Neurologic:  _27 Dizziness  _28 Blackouts   _29 Seizures   _30 History of stroke   _31 History of TIA  _32 Aphasia   _33 Temporary blindness   _34 Dysphagia   _35 Weakness or numbness in arms   _36 Weakness or numbness in legs Musculoskeletal:  _37 Arthritis   _38 Joint swelling   _39 Joint pain   _40 Low back pain Hematologic:  _41 Easy bruising  _42 Easy bleeding   _43 Hypercoagulable state   _44 Anemic  _45 Hepatitis Gastrointestinal:  _46 Blood in stool   _47 Vomiting blood  _48 Gastroesophageal reflux/heartburn   _49 Difficulty swallowing. Genitourinary:  _50 Chronic kidney disease   _51 Difficult urination  _52 Frequent urination  _53 Burning with urination   _54 Blood in urine Skin:  _55 Rashes   _56 Ulcers   _57 Wounds Psychological:  _58 History of anxiety   _59  History of major depression.  Physical Examination  Vitals:   04/14/16 1451 04/14/16 1452  BP: (!) 160/76 (!) 180/77  Pulse: 69   Resp: 16   Weight: 205 lb (93 kg)   Height: _60  (1.651 m)    Body mass index is 34.11 kg/m. Gen:  WD/WN, NAD Head: [place]/AT, No temporalis wasting. Ear/Nose/Throat: Hearing grossly  intact, nares w/o erythema or drainage, trachea midline Eyes: Conjunctiva clear. Sclera non-icteric Neck: Supple.  No bruit or JVD.  Pulmonary:  Good air movement, equal and clear to auscultation bilaterally.  Cardiac: RRR, normal S1, S2, no Murmurs, rubs or gallops. Vascular:  Vessel Right Left  Radial Palpable Palpable  Ulnar Palpable Palpable  Brachial Palpable Palpable  Carotid Palpable, without bruit Palpable, without  bruit  Aorta Not palpable N/A  Femoral Palpable Palpable  Popliteal Palpable Palpable  PT Palpable Palpable  DP 1+ Palpable Palpable   Gastrointestinal: soft, non-tender/non-distended. No guarding/reflex.  Musculoskeletal: M/S 5/5 throughout.  No deformity or atrophy.  Neurologic: CN 2-12 intact. Sensation grossly intact in extremities.  Symmetrical.  Speech is fluent. Motor exam as listed above. Psychiatric: Judgment intact, Mood & affect appropriate for pt's clinical situation. Dermatologic: No rashes or ulcers noted.  No cellulitis or open wounds. Lymph : No Cervical, Axillary, or Inguinal lymphadenopathy.     CBC Lab Results  Component Value Date   WBC 6.3 10/10/2015   HGB 12.9 10/10/2015   HCT 38.2 10/10/2015   MCV 91.2 10/10/2015   PLT 208 10/10/2015    BMET    Component Value Date/Time   NA 140 02/24/2016 1013   NA 138 10/23/2013 1236   K 4.2 02/24/2016 1013   K 3.8 08/27/2014 0849   CL 105 02/24/2016 1013   CL 104 10/23/2013 1236   CO2 26 02/24/2016 1013   CO2 28 10/23/2013 1236   GLUCOSE 106 (H) 02/24/2016 1013   GLUCOSE 96 10/23/2013 1236   BUN 20 02/24/2016 1013   BUN 17 10/23/2013 1236   CREATININE 0.85 02/24/2016 1013   CREATININE 0.87 10/23/2013 1236   CALCIUM 9.6 02/24/2016 1013   CALCIUM 9.3 10/23/2013 1236   GFRNONAA 58 (L) 10/19/2015 0824   GFRNONAA >60 10/23/2013 1236   GFRAA >60 10/19/2015 0824   GFRAA >60 10/23/2013 1236   CrCl cannot be calculated (Patient's most recent lab result is older than the maximum 21 days  allowed.).  COAG Lab Results  Component Value Date   INR 0.9 07/13/2012   INR 0.9 07/13/2012    Radiology No results found.   Assessment/Plan Hypercholesterolemia lipid control important in reducing the progression of atherosclerotic disease. Continue statin therapy   Hypertension blood pressure control important in reducing the progression of atherosclerotic disease. On appropriate oral medications.   Carotid artery stenosis Carotid stenosis remains mild in the 1-39% range bilaterally. Asymptomatic. Continue aspirin and statin agent. Plan to recheck in 2 years with duplex.  Peripheral vascular disease Previous history of revascularization many years ago. No worrisome symptoms of claudication, ischemic rest pain, or ulceration. ABIs today are normal at 1.1 on the right and 1.2 on the left. Plan to recheck in 2 years with noninvasive studies.    Leotis Pain, MD  04/14/2016 3:57 PM    This note was created with Dragon medical transcription system.  Any errors from dictation are purely unintentional

## 2016-04-14 NOTE — Assessment & Plan Note (Signed)
Carotid stenosis remains mild in the 1-39% range bilaterally. Asymptomatic. Continue aspirin and statin agent. Plan to recheck in 2 years with duplex.

## 2016-04-22 DIAGNOSIS — G5601 Carpal tunnel syndrome, right upper limb: Secondary | ICD-10-CM | POA: Diagnosis not present

## 2016-05-14 ENCOUNTER — Ambulatory Visit: Payer: Medicare Other | Admitting: Internal Medicine

## 2016-05-21 ENCOUNTER — Encounter: Payer: Self-pay | Admitting: Radiation Oncology

## 2016-05-21 ENCOUNTER — Ambulatory Visit
Admission: RE | Admit: 2016-05-21 | Discharge: 2016-05-21 | Disposition: A | Discharge status: Home / Self Care | Payer: Medicare Other | Source: Ambulatory Visit | Attending: Radiation Oncology | Admitting: Radiation Oncology

## 2016-05-21 VITALS — BP 184/90 | HR 67 | Temp 96.7°F | Resp 18 | Wt 204.7 lb

## 2016-05-21 DIAGNOSIS — C50512 Malignant neoplasm of lower-outer quadrant of left female breast: Secondary | ICD-10-CM | POA: Diagnosis not present

## 2016-05-21 DIAGNOSIS — Z170 Estrogen receptor positive status [ER+]: Secondary | ICD-10-CM | POA: Diagnosis not present

## 2016-05-21 DIAGNOSIS — Z923 Personal history of irradiation: Secondary | ICD-10-CM | POA: Diagnosis not present

## 2016-05-21 DIAGNOSIS — Z79811 Long term (current) use of aromatase inhibitors: Secondary | ICD-10-CM | POA: Diagnosis not present

## 2016-05-21 NOTE — Progress Notes (Signed)
Radiation Oncology Follow up Note  Name: Lindsey Bentley   Date:   05/21/2016 MRN:  [id number] DOB: 05/04/1946    This 71 y.o. female presents to the clinic today for close to 2 year follow-up for stage I ER/PR positive breast cancer status post accelerated partial breast radiation to her left breast.  REFERRING PROVIDER: Robert Bellow, MD  HPI: Patient is a 71 year old female now out close to 2 years having completed accelerated partial breast radiation to her left breast for stage IA ER/PR positive invasive mammary carcinoma status post wide local excision. She is seen today in routine follow-up and is doing well.. She is currently on letrozole tolerating that well without side effect. She does have some suspicious calcifications in the left breast which is being followed by her surgeon. At this time no need for biopsy according to the patient.  COMPLICATIONS OF TREATMENT: none  FOLLOW UP COMPLIANCE: keeps appointments   PHYSICAL EXAM:  BP (!) 184/90   Pulse 67   Temp (!) 96.7 F (35.9 C)   Resp 18   Wt 204 lb 11.2 oz (92.8 kg)   BMI 34.06 kg/m  Lungs are clear to A&P cardiac examination essentially unremarkable with regular rate and rhythm. No dominant mass or nodularity is noted in either breast in 2 positions examined. Incision is well-healed. No axillary or supraclavicular adenopathy is appreciated. Cosmetic result is excellent. Well-developed well-nourished patient in NAD. HEENT reveals PERLA, EOMI, discs not visualized.  Oral cavity is clear. No oral mucosal lesions are identified. Neck is clear without evidence of cervical or supraclavicular adenopathy. Lungs are clear to A&P. Cardiac examination is essentially unremarkable with regular rate and rhythm without murmur rub or thrill. Abdomen is benign with no organomegaly or masses noted. Motor sensory and DTR levels are equal and symmetric in the upper and lower extremities. Cranial nerves II through XII are grossly intact.  Proprioception is intact. No peripheral adenopathy or edema is identified. No motor or sensory levels are noted. Crude visual fields are within normal range.  RADIOLOGY RESULTS: Serial mammograms and ultrasounds are reviewed  PLAN: At this time patient is doing well I will leave it up to the surgeon to decide if appropriate biopsy is necessary at what time. My evaluation is the mammograms appear to be stable over time. I am please were overall progress. I've asked to see her back in 1 year for follow-up. Patient is to call sooner with any concerns.  I would like to take this opportunity to thank you for allowing me to participate in the care of your patient.Armstead Peaks., MD

## 2016-05-28 ENCOUNTER — Ambulatory Visit: Payer: Medicare Other | Admitting: Internal Medicine

## 2016-06-04 DIAGNOSIS — R600 Localized edema: Secondary | ICD-10-CM | POA: Insufficient documentation

## 2016-06-04 DIAGNOSIS — I10 Essential (primary) hypertension: Secondary | ICD-10-CM | POA: Diagnosis not present

## 2016-06-04 DIAGNOSIS — E782 Mixed hyperlipidemia: Secondary | ICD-10-CM | POA: Diagnosis not present

## 2016-06-16 DIAGNOSIS — G4733 Obstructive sleep apnea (adult) (pediatric): Secondary | ICD-10-CM | POA: Diagnosis not present

## 2016-06-25 ENCOUNTER — Ambulatory Visit: Payer: Medicare Other | Admitting: Internal Medicine

## 2016-06-29 ENCOUNTER — Other Ambulatory Visit: Payer: Self-pay

## 2016-06-29 DIAGNOSIS — C50512 Malignant neoplasm of lower-outer quadrant of left female breast: Secondary | ICD-10-CM

## 2016-07-08 ENCOUNTER — Ambulatory Visit (INDEPENDENT_AMBULATORY_CARE_PROVIDER_SITE_OTHER): Payer: Medicare Other | Admitting: Internal Medicine

## 2016-07-08 ENCOUNTER — Encounter: Payer: Self-pay | Admitting: Internal Medicine

## 2016-07-08 VITALS — BP 144/68 | HR 72 | Temp 98.0°F | Wt 208.4 lb

## 2016-07-08 DIAGNOSIS — I10 Essential (primary) hypertension: Secondary | ICD-10-CM

## 2016-07-08 DIAGNOSIS — G5601 Carpal tunnel syndrome, right upper limb: Secondary | ICD-10-CM

## 2016-07-08 DIAGNOSIS — F439 Reaction to severe stress, unspecified: Secondary | ICD-10-CM

## 2016-07-08 DIAGNOSIS — I739 Peripheral vascular disease, unspecified: Secondary | ICD-10-CM

## 2016-07-08 DIAGNOSIS — L989 Disorder of the skin and subcutaneous tissue, unspecified: Secondary | ICD-10-CM

## 2016-07-08 DIAGNOSIS — E7800 Pure hypercholesterolemia, unspecified: Secondary | ICD-10-CM

## 2016-07-08 DIAGNOSIS — R739 Hyperglycemia, unspecified: Secondary | ICD-10-CM | POA: Diagnosis not present

## 2016-07-08 DIAGNOSIS — R946 Abnormal results of thyroid function studies: Secondary | ICD-10-CM | POA: Diagnosis not present

## 2016-07-08 DIAGNOSIS — I25119 Atherosclerotic heart disease of native coronary artery with unspecified angina pectoris: Secondary | ICD-10-CM

## 2016-07-08 DIAGNOSIS — M542 Cervicalgia: Secondary | ICD-10-CM | POA: Diagnosis not present

## 2016-07-08 DIAGNOSIS — C50912 Malignant neoplasm of unspecified site of left female breast: Secondary | ICD-10-CM

## 2016-07-08 DIAGNOSIS — R7989 Other specified abnormal findings of blood chemistry: Secondary | ICD-10-CM

## 2016-07-08 DIAGNOSIS — R945 Abnormal results of liver function studies: Secondary | ICD-10-CM

## 2016-07-08 NOTE — Progress Notes (Signed)
[name], female   DOB: 08/31/1945, 71 y.o.   MRN: [id number]   Subjective:    [name], female    DOB: 1945-12-06, 71 y.o.   MRN: [id number]  HPI  Patient here for a scheduled follow up.  Just evaluated by cardiology.  Felt stable.  She denies any chest pain.  Breathing stable.  Saw Dr Lucky Cowboy 04/14/16.  Stable carotid (1-39%).  Recommended f/u in two years.  Eating.  No acid reflux.  No nausea or vomiting.  Bowels stable.  Overall she feels she is doing better.  S/p carpal tunnel surgery.  Hand is better.  Neck is stable.  Due f/u next month.  Does want skin check and wants lesion on right arm checked.     Past Medical History:  Diagnosis Date  . Arthritis   . Breast cancer ([place]) 2016   LT LUMPECTOMY - TI, NO, MO - IDC, ER/PR pos, Her 2 neg  . Carotid artery occlusion   . 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 Coronary artery disease   . Depression   . Diffuse cystic mastopathy 2013  . Elevated TSH   . Family history of adverse reaction to anesthesia    Pt stated that son had a seizure with a combination of anesthesia and pain medication."  . GERD (gastroesophageal reflux disease)   . Glaucoma   . High cholesterol   . History of chicken pox   . Hyperglycemia   . Hyperlipidemia   . Hypertension   . Peripheral vascular disease ([place])   . Personal history of radiation therapy 2016   BREAST CA  . Pneumonia March 2014  . Skin cancer 2013  . Sleep apnea    wears CPAP set at 2.5  . Wears glasses    Past Surgical History:  Procedure Laterality Date  . BACK SURGERY  1986   ruptured disc  . BREAST BIOPSY Left 2008   NEG  . BREAST BIOPSY Left 08-20-14   POS  . BREAST BIOPSY Right 2007   NEG  . BREAST EXCISIONAL BIOPSY Left 1984   NEG  . BREAST SURGERY Left 09-05-14   left breast lumpectomy with SLN biopsy, ER/PR positive. HER2 negative.  . CHOLECYSTECTOMY  2006  . COLONOSCOPY  2010   Dr. Jamal Collin  . DILATION AND  CURETTAGE OF UTERUS    . Leg stent  2011  . MOLE REMOVAL  2013   15 removed  . POSTERIOR CERVICAL LAMINECTOMY Left 10/15/2015   Procedure: Left Cervical four- five Hemilaminectomy/Remove mass;  Surgeon: Leeroy Cha, MD;  Location: [facility];  Service: Neurosurgery;  Laterality: Left;  Left C4-5 Hemilaminectomy/Remove mass   Family History  Problem Relation Age of Onset  . Cancer Father     Prostate  . Diabetes Father   . Cerebrovascular Accident Father   . Hypertension Mother   . AAA (abdominal aortic aneurysm) Mother   . Hyperlipidemia Other     Parent  . Miscarriages / Stillbirths Other     Parent  . Hypertension Other     parent  . Heart disease Other     Parent  . Breast cancer Maternal Aunt 72   Social History   Social History  . Marital status: Married    Spouse name: N/A  . Number of children: 3  . Years of education: 12   Occupational History  . Caregiver     Social History Main  Topics  . Smoking status: Never Smoker  . Smokeless tobacco: Never Used  . Alcohol use No  . Drug use: No  . Sexual activity: No   Other Topics Concern  . None   Social History Narrative   Regular exercise-mo   Caffeine Use-no    Outpatient Encounter Prescriptions as of 07/08/2016  Medication Sig  . aspirin (BAYER ASPIRIN) 325 MG tablet Take 325 mg by mouth daily.  . carvedilol (COREG) 12.5 MG tablet Take by mouth.  . Cholecalciferol (VITAMIN D-1000 MAX ST) 1000 units tablet Take 1,000 Units by mouth daily.   [name] letrozole (FEMARA) 2.5 MG tablet TAKE 1 TABLET BY MOUTH EVERY DAY  . losartan-hydrochlorothiazide (HYZAAR) 100-25 MG tablet TAKE 1 TABLET BY MOUTH EVERY DAY  . rosuvastatin (CRESTOR) 10 MG tablet TAKE 1 TABLET(10 MG) BY MOUTH DAILY  . vitamin E 400 UNIT capsule Take 400 Units by mouth daily.  . [DISCONTINUED] amLODipine (NORVASC) 5 MG tablet Take 1 tablet (5 mg total) by mouth daily. (Patient not taking: Reported on 07/08/2016)  . [DISCONTINUED] carvedilol (COREG)  6.25 MG tablet Take 1 tablet (6.25 mg total) by mouth 2 (two) times daily with a meal. (Patient not taking: Reported on 07/08/2016)  . [DISCONTINUED] oxyCODONE (OXY IR/ROXICODONE) 5 MG immediate release tablet    No facility-administered encounter medications on file as of 07/08/2016.     Review of Systems  Constitutional: Negative for appetite change and fever.  HENT: Negative for congestion and sinus pressure.   Respiratory: Negative for cough, chest tightness and shortness of breath.   Cardiovascular: Negative for chest pain and palpitations.  Gastrointestinal: Negative for abdominal pain, diarrhea, nausea and vomiting.  Genitourinary: Negative for difficulty urinating and dysuria.  Musculoskeletal: Negative for back pain and joint swelling.       Neck stable.   Skin: Negative for color change and rash.  Neurological: Negative for dizziness, light-headedness and headaches.  Psychiatric/Behavioral: Negative for agitation and dysphoric mood.       Objective:    Physical Exam  Constitutional: She appears well-developed and well-nourished. No distress.  HENT:  Nose: Nose normal.  Mouth/Throat: Oropharynx is clear and moist.  Neck: Neck supple. No thyromegaly present.  Cardiovascular: Normal rate and regular rhythm.   Pulmonary/Chest: Breath sounds normal. No respiratory distress. She has no wheezes.  Abdominal: Soft. Bowel sounds are normal. There is no tenderness.  Musculoskeletal: She exhibits no tenderness.  No increased lower extremity edema.    Lymphadenopathy:    She has no cervical adenopathy.  Skin: No rash noted. No erythema.  Psychiatric: She has a normal mood and affect. Her behavior is normal.    BP (!) 144/68 (BP Location: Right Arm, Patient Position: Sitting, Cuff Size: Large)   Pulse 72   Temp 98 F (36.7 C) (Oral)   Wt 208 lb 6 oz (94.5 kg)   SpO2 98%   BMI 34.68 kg/m  Wt Readings from Last 3 Encounters:  07/08/16 208 lb 6 oz (94.5 kg)  05/21/16 204 lb  11.2 oz (92.8 kg)  04/14/16 205 lb (93 kg)     Lab Results  Component Value Date   WBC 6.3 10/10/2015   HGB 12.9 10/10/2015   HCT 38.2 10/10/2015   PLT 208 10/10/2015   GLUCOSE 106 (H) 02/24/2016   CHOL 167 02/24/2016   TRIG 208.0 (H) 02/24/2016   HDL 40.60 02/24/2016   LDLDIRECT 103.0 02/24/2016   LDLCALC 108 (H) 01/01/2015   ALT 29 02/24/2016   AST  20 02/24/2016   NA 140 02/24/2016   K 4.2 02/24/2016   CL 105 02/24/2016   CREATININE 0.85 02/24/2016   BUN 20 02/24/2016   CO2 26 02/24/2016   TSH 3.00 11/20/2015   INR 0.9 07/13/2012   HGBA1C 6.5 02/24/2016       Assessment & Plan:   Problem List Items Addressed This Visit    Abnormal liver function test    Follow liver panel.        Breast cancer [facility])    Has been evaluated by Dr Jamal Collin - last 03/2016.  Mammogram 02/2016 with new calcifications.  Recommended 6 month f/u.        CAD (coronary artery disease)    Followed by cardiology.  Stable.        Relevant Medications   carvedilol (COREG) 12.5 MG tablet   Carpal tunnel syndrome    S/p surgery and doing well.  Follow.       Hypercholesterolemia    Low cholesterol diet and exercise.  On crestor.  Follow lipid panel and liver function tests.        Relevant Medications   carvedilol (COREG) 12.5 MG tablet   Other Relevant Orders   Hepatic function panel   Lipid panel   Hyperglycemia    Low carb diet and exercise.  Follow met b and a1c.       Relevant Orders   Hemoglobin A1c   Hypertension    Blood pressure as outlined.  Has been doing better.  Hold on making changes.  Cardiology just stopped her amlodipine and increased coreg.  Has f/u planned with cardiology.  Follow pressures.  Follow metabolic panel.        Relevant Medications   carvedilol (COREG) 12.5 MG tablet   Other Relevant Orders   Basic metabolic panel   Neck pain    S/p neck surgery.  Pain improved.  Has f/u next month.        Peripheral vascular disease ([place])    Just evaluated  by Dr Lucky Cowboy 04/14/16.  Stable.  F/u in 2 years.        Relevant Medications   carvedilol (COREG) 12.5 MG tablet   Stress    Handling things well.  Doing better.  Follow.         Other Visit Diagnoses    Skin lesion    -  Primary   Wants skin survey and has lesion right arm.  refer to dermatology.  wants to see Dr Allyson Sabal.    Relevant Orders   Ambulatory referral to Dermatology   Elevated TSH       Relevant Orders   TSH       Einar Pheasant, MD

## 2016-07-08 NOTE — Progress Notes (Signed)
Pre visit review using our clinic review tool, if applicable. No additional management support is needed unless otherwise documented below in the visit note. 

## 2016-07-12 ENCOUNTER — Encounter: Payer: Self-pay | Admitting: Internal Medicine

## 2016-07-12 NOTE — Assessment & Plan Note (Signed)
Low cholesterol diet and exercise.  On crestor.  Follow lipid panel and liver function tests.  

## 2016-07-12 NOTE — Assessment & Plan Note (Signed)
S/p surgery and doing well.  Follow.   

## 2016-07-12 NOTE — Assessment & Plan Note (Signed)
Low carb diet and exercise.  Follow met b and a1c.  

## 2016-07-12 NOTE — Assessment & Plan Note (Signed)
Handling things well.  Doing better.  Follow.

## 2016-07-12 NOTE — Assessment & Plan Note (Signed)
Has been evaluated by Dr Jamal Collin - last 03/2016.  Mammogram 02/2016 with new calcifications.  Recommended 6 month f/u.

## 2016-07-12 NOTE — Assessment & Plan Note (Addendum)
Blood pressure as outlined.  Has been doing better.  Hold on making changes.  Cardiology just stopped her amlodipine and increased coreg.  Has f/u planned with cardiology.  Follow pressures.  Follow metabolic panel.

## 2016-07-12 NOTE — Assessment & Plan Note (Signed)
S/p neck surgery.  Pain improved.  Has f/u next month.

## 2016-07-12 NOTE — Assessment & Plan Note (Signed)
Follow liver panel.  

## 2016-07-12 NOTE — Assessment & Plan Note (Signed)
Just evaluated by Dr Lucky Cowboy 04/14/16.  Stable.  F/u in 2 years.

## 2016-07-12 NOTE — Assessment & Plan Note (Signed)
Followed by cardiology. Stable.   

## 2016-07-29 ENCOUNTER — Other Ambulatory Visit: Payer: Medicare Other

## 2016-08-03 ENCOUNTER — Other Ambulatory Visit (INDEPENDENT_AMBULATORY_CARE_PROVIDER_SITE_OTHER): Payer: Medicare Other

## 2016-08-03 DIAGNOSIS — R946 Abnormal results of thyroid function studies: Secondary | ICD-10-CM

## 2016-08-03 DIAGNOSIS — I10 Essential (primary) hypertension: Secondary | ICD-10-CM | POA: Diagnosis not present

## 2016-08-03 DIAGNOSIS — R739 Hyperglycemia, unspecified: Secondary | ICD-10-CM

## 2016-08-03 DIAGNOSIS — E7800 Pure hypercholesterolemia, unspecified: Secondary | ICD-10-CM | POA: Diagnosis not present

## 2016-08-03 DIAGNOSIS — R7989 Other specified abnormal findings of blood chemistry: Secondary | ICD-10-CM

## 2016-08-03 LAB — HEPATIC FUNCTION PANEL
ALBUMIN: 4.5 g/dL (ref 3.5–5.2)
ALK PHOS: 58 U/L (ref 39–117)
ALT: 27 U/L (ref 0–35)
AST: 20 U/L (ref 0–37)
BILIRUBIN DIRECT: 0.1 mg/dL (ref 0.0–0.3)
TOTAL PROTEIN: 7.2 g/dL (ref 6.0–8.3)
Total Bilirubin: 0.5 mg/dL (ref 0.2–1.2)

## 2016-08-03 LAB — LIPID PANEL
CHOL/HDL RATIO: 4
Cholesterol: 173 mg/dL (ref 0–200)
HDL: 43.4 mg/dL (ref >39.00)
LDL Cholesterol: 93 mg/dL (ref 0–99)
NONHDL: 129.53
Triglycerides: 182 mg/dL — ABNORMAL HIGH (ref 0.0–149.0)
VLDL: 36.4 mg/dL (ref 0.0–40.0)

## 2016-08-03 LAB — BASIC METABOLIC PANEL
BUN: 18 mg/dL (ref 6–23)
CHLORIDE: 105 meq/L (ref 96–112)
CO2: 27 mEq/L (ref 19–32)
CREATININE: 0.8 mg/dL (ref 0.40–1.20)
Calcium: 10.1 mg/dL (ref 8.4–10.5)
GFR: 74.95 mL/min (ref >60.00)
Glucose, Bld: 112 mg/dL — ABNORMAL HIGH (ref 70–99)
POTASSIUM: 4.6 meq/L (ref 3.5–5.1)
Sodium: 141 mEq/L (ref 135–145)

## 2016-08-03 LAB — TSH: TSH: 3.5 u[IU]/mL (ref 0.35–4.50)

## 2016-08-03 LAB — HEMOGLOBIN A1C: Hgb A1c MFr Bld: 6.7 % — ABNORMAL HIGH (ref 4.6–6.5)

## 2016-08-17 ENCOUNTER — Ambulatory Visit
Admission: RE | Admit: 2016-08-17 | Discharge: 2016-08-17 | Disposition: A | Discharge status: Home / Self Care | Payer: Medicare Other | Source: Ambulatory Visit | Attending: General Surgery | Admitting: General Surgery

## 2016-08-17 DIAGNOSIS — C50512 Malignant neoplasm of lower-outer quadrant of left female breast: Secondary | ICD-10-CM

## 2016-08-17 DIAGNOSIS — Z86000 Personal history of in-situ neoplasm of breast: Secondary | ICD-10-CM | POA: Diagnosis not present

## 2016-08-17 DIAGNOSIS — R921 Mammographic calcification found on diagnostic imaging of breast: Secondary | ICD-10-CM | POA: Insufficient documentation

## 2016-08-17 LAB — HM MAMMOGRAPHY

## 2016-08-18 DIAGNOSIS — G4733 Obstructive sleep apnea (adult) (pediatric): Secondary | ICD-10-CM | POA: Diagnosis not present

## 2016-08-20 DIAGNOSIS — H6123 Impacted cerumen, bilateral: Secondary | ICD-10-CM | POA: Diagnosis not present

## 2016-08-20 DIAGNOSIS — R221 Localized swelling, mass and lump, neck: Secondary | ICD-10-CM | POA: Diagnosis not present

## 2016-08-26 ENCOUNTER — Ambulatory Visit (INDEPENDENT_AMBULATORY_CARE_PROVIDER_SITE_OTHER): Payer: Medicare Other | Admitting: General Surgery

## 2016-08-26 VITALS — BP 130/60 | HR 74 | Resp 12 | Ht 64.0 in | Wt 205.0 lb

## 2016-08-26 DIAGNOSIS — Z170 Estrogen receptor positive status [ER+]: Secondary | ICD-10-CM | POA: Diagnosis not present

## 2016-08-26 DIAGNOSIS — C50512 Malignant neoplasm of lower-outer quadrant of left female breast: Secondary | ICD-10-CM

## 2016-08-26 NOTE — Patient Instructions (Addendum)
Patient to return in six month bilateral diagnotic mammogram.  Ordered CEA and Ca 27/29

## 2016-08-26 NOTE — Progress Notes (Signed)
[name], female   DOB: 1945/06/15, 71 y.o.   MRN: [id number]  Chief Complaint  Patient presents with  . Follow-up    mammogram     HPI Lindsey Bentley is a 71 y.o. female who presents for a breast cancer follow up. The most recent mammogram was done on 08/17/2016.  Patient does perform regular self breast checks and gets regular mammograms done.  No new breast problems. Tolerating letrozole, with occasional hot flashes.   HPI  Past Medical History:  Diagnosis Date  . Arthritis   . Breast cancer ([place]) 2016   LT LUMPECTOMY - TI, NO, MO - IDC, ER/PR pos, Her 2 neg  . Carotid artery occlusion   . 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 Coronary artery disease   . Depression   . Diffuse cystic mastopathy 2013  . Elevated TSH   . Family history of adverse reaction to anesthesia    Pt stated that son had a seizure with a combination of anesthesia and pain medication."  . GERD (gastroesophageal reflux disease)   . Glaucoma   . High cholesterol   . History of chicken pox   . Hyperglycemia   . Hyperlipidemia   . Hypertension   . Peripheral vascular disease ([place])   . Personal history of radiation therapy 2016   BREAST CA  . Pneumonia March 2014  . Skin cancer 2013  . Sleep apnea    wears CPAP set at 2.5  . Wears glasses     Past Surgical History:  Procedure Laterality Date  . BACK SURGERY  1986   ruptured disc  . BREAST BIOPSY Left 2008   NEG  . BREAST BIOPSY Left 08-20-14   POS  . BREAST BIOPSY Right 2007   NEG  . BREAST EXCISIONAL BIOPSY Left 1984   NEG  . BREAST SURGERY Left 09-05-14   left breast lumpectomy with SLN biopsy, ER/PR positive. HER2 negative.  . CHOLECYSTECTOMY  2006  . COLONOSCOPY  2010   Dr. Jamal Collin  . DILATION AND CURETTAGE OF UTERUS    . Leg stent  2011  . MOLE REMOVAL  2013   15 removed  . POSTERIOR CERVICAL LAMINECTOMY Left 10/15/2015   Procedure: Left Cervical four- five Hemilaminectomy/Remove mass;   Surgeon: Leeroy Cha, MD;  Location: [facility];  Service: Neurosurgery;  Laterality: Left;  Left C4-5 Hemilaminectomy/Remove mass    Family History  Problem Relation Age of Onset  . Cancer Father     Prostate  . Diabetes Father   . Cerebrovascular Accident Father   . Hypertension Mother   . AAA (abdominal aortic aneurysm) Mother   . Hyperlipidemia Other     Parent  . Miscarriages / Stillbirths Other     Parent  . Hypertension Other     parent  . Heart disease Other     Parent  . Breast cancer Maternal Aunt 72    Social History Social History  Substance Use Topics  . Smoking status: Never Smoker  . Smokeless tobacco: Never Used  . Alcohol use No    Allergies  Allergen Reactions  . Codeine Nausea And Vomiting  . Adhesive [Tape] Itching and Rash    Please use "paper" tape  . Prednisone Swelling  . Statins Other (See Comments)    Muscle Pain, can take Crestor    Current Outpatient Prescriptions  Medication Sig Dispense Refill  . aspirin (BAYER ASPIRIN) 325 MG tablet Take  325 mg by mouth daily.    . carvedilol (COREG) 12.5 MG tablet Take by mouth.    . Cholecalciferol (VITAMIN D-1000 MAX ST) 1000 units tablet Take 1,000 Units by mouth daily.     [name] letrozole (FEMARA) 2.5 MG tablet TAKE 1 TABLET BY MOUTH EVERY DAY 30 tablet 11  . losartan-hydrochlorothiazide (HYZAAR) 100-25 MG tablet TAKE 1 TABLET BY MOUTH EVERY DAY 90 tablet 3  . rosuvastatin (CRESTOR) 10 MG tablet TAKE 1 TABLET(10 MG) BY MOUTH DAILY 30 tablet 11  . vitamin E 400 UNIT capsule Take 400 Units by mouth daily.     No current facility-administered medications for this visit.     Review of Systems Review of Systems  Constitutional: Negative.   Respiratory: Negative.   Cardiovascular: Negative.     Blood pressure 130/60, pulse 74, resp. rate 12, height _0  (1.626 m), weight 205 lb (93 kg).  Physical Exam Physical Exam  Constitutional: She is oriented to person, place, and time. She appears  well-developed and well-nourished.  Eyes: Conjunctivae are normal. Pupils are equal, round, and reactive to light.  Neck: Neck supple.  Cardiovascular: Normal rate, regular rhythm and normal heart sounds.   Pulmonary/Chest: Effort normal and breath sounds normal. Right breast exhibits no inverted nipple, no mass, no nipple discharge, no skin change and no tenderness. Left breast exhibits no inverted nipple, no mass, no nipple discharge, no skin change and no tenderness.    Abdominal: Soft. Bowel sounds are normal. There is no hepatomegaly. There is no tenderness. No hernia.  Lymphadenopathy:    She has no cervical adenopathy.    She has no axillary adenopathy.  Neurological: She is alert and oriented to person, place, and time.  Skin: Skin is warm and dry.    Data Reviewed Prior notes reviewed Mammogram reviewed - stable. Coarse calcifications around lumpectomy site noted by radiologist, similar to previous mammogram. Likely to be fat necrosis. Follow up with left diagnostic mammogram in 6 months.   Assessment    CA, left breast lower outer quadrant, T1,N0,ER/PR pos,Her 2 neg.  She is 75yr post lumpectomy and radiation. Currently on letrozole and doing well. Discussed mammographic findings with patient.     Plan  Ordered CEA and Ca 27/ 29      Patient to return in six month bilateral diagnotic mammogram.   HPI, Physical Exam, Assessment and Plan have been scribed under the direction and in the presence of SMckinley Jewel MD  JGaspar Cola CMA  I have completed the exam and reviewed the above documentation for accuracy and completeness.  I agree with the above.  DHaematologisthas been used and any errors in dictation or transcription are unintentional.  Seeplaputhur G. SJamal Collin M.D., F.A.C.S.  SJunie PanningG 08/26/2016, 10:34 AM

## 2016-08-27 ENCOUNTER — Telehealth: Payer: Self-pay | Admitting: *Deleted

## 2016-08-27 LAB — CEA: CEA: 1.5 ng/mL (ref 0.0–4.7)

## 2016-08-27 LAB — CANCER ANTIGEN 27.29: CA 27.29: 12.5 U/mL (ref 0.0–38.6)

## 2016-08-27 NOTE — Telephone Encounter (Signed)
-----   Message from Christene Lye, MD sent at 08/27/2016  2:44 PM EDT ----- Please inform pt, labs are normal

## 2016-08-27 NOTE — Telephone Encounter (Signed)
Notified patient as instructed, patient pleased. Discussed follow-up appointments, patient agrees  

## 2016-09-21 ENCOUNTER — Other Ambulatory Visit: Payer: Self-pay | Admitting: Internal Medicine

## 2016-09-22 ENCOUNTER — Emergency Department: Payer: Medicare Other

## 2016-09-22 ENCOUNTER — Emergency Department
Admission: EM | Admit: 2016-09-22 | Discharge: 2016-09-23 | Disposition: A | Discharge status: Home / Self Care | Payer: Medicare Other | Attending: Emergency Medicine | Admitting: Emergency Medicine

## 2016-09-22 ENCOUNTER — Encounter: Payer: Self-pay | Admitting: Emergency Medicine

## 2016-09-22 DIAGNOSIS — I2510 Atherosclerotic heart disease of native coronary artery without angina pectoris: Secondary | ICD-10-CM | POA: Insufficient documentation

## 2016-09-22 DIAGNOSIS — Z79899 Other long term (current) drug therapy: Secondary | ICD-10-CM | POA: Insufficient documentation

## 2016-09-22 DIAGNOSIS — R42 Dizziness and giddiness: Secondary | ICD-10-CM | POA: Diagnosis not present

## 2016-09-22 DIAGNOSIS — M25511 Pain in right shoulder: Secondary | ICD-10-CM | POA: Diagnosis not present

## 2016-09-22 DIAGNOSIS — Z853 Personal history of malignant neoplasm of breast: Secondary | ICD-10-CM | POA: Insufficient documentation

## 2016-09-22 DIAGNOSIS — R079 Chest pain, unspecified: Secondary | ICD-10-CM | POA: Diagnosis not present

## 2016-09-22 DIAGNOSIS — G4733 Obstructive sleep apnea (adult) (pediatric): Secondary | ICD-10-CM | POA: Diagnosis not present

## 2016-09-22 DIAGNOSIS — J9811 Atelectasis: Secondary | ICD-10-CM | POA: Diagnosis not present

## 2016-09-22 DIAGNOSIS — I10 Essential (primary) hypertension: Secondary | ICD-10-CM | POA: Diagnosis not present

## 2016-09-22 DIAGNOSIS — R0789 Other chest pain: Secondary | ICD-10-CM

## 2016-09-22 HISTORY — DX: Acute embolism and thrombosis of unspecified deep veins of unspecified lower extremity: I82.409

## 2016-09-22 LAB — CBC
HCT: 36 % (ref 35.0–47.0)
Hemoglobin: 12.8 g/dL (ref 12.0–16.0)
MCH: 32.4 pg (ref 26.0–34.0)
MCHC: 35.6 g/dL (ref 32.0–36.0)
MCV: 91.1 fL (ref 80.0–100.0)
Platelets: 190 10*3/uL (ref 150–440)
RBC: 3.95 MIL/uL (ref 3.80–5.20)
RDW: 13.4 % (ref 11.5–14.5)
WBC: 8.2 10*3/uL (ref 3.6–11.0)

## 2016-09-22 LAB — BASIC METABOLIC PANEL
ANION GAP: 9 (ref 5–15)
BUN: 22 mg/dL — AB (ref 6–20)
CO2: 25 mmol/L (ref 22–32)
Calcium: 9.8 mg/dL (ref 8.9–10.3)
Chloride: 108 mmol/L (ref 101–111)
Creatinine, Ser: 1.08 mg/dL — ABNORMAL HIGH (ref 0.44–1.00)
GFR, EST AFRICAN AMERICAN: 58 mL/min — AB (ref >60)
GFR, EST NON AFRICAN AMERICAN: 50 mL/min — AB (ref >60)
Glucose, Bld: 153 mg/dL — ABNORMAL HIGH (ref 65–99)
POTASSIUM: 3.7 mmol/L (ref 3.5–5.1)
SODIUM: 142 mmol/L (ref 135–145)

## 2016-09-22 LAB — FIBRIN DERIVATIVES D-DIMER (ARMC ONLY): FIBRIN DERIVATIVES D-DIMER (ARMC): 594.45 — AB (ref 0.00–499.00)

## 2016-09-22 LAB — TROPONIN I: Troponin I: <0.03 ng/mL (ref <0.03)

## 2016-09-22 MED ORDER — MECLIZINE HCL 25 MG PO TABS
25.0000 mg | ORAL_TABLET | Freq: Once | ORAL | Status: AC
  Administered 2016-09-22: 25 mg via ORAL
  Filled 2016-09-22: qty 1

## 2016-09-22 NOTE — ED Triage Notes (Signed)
Patient to ER for dizziness x2 hours. States she has also been having "tugging" sensation to right and mid chest. +HA. H/o stent in leg d/t clot.

## 2016-09-22 NOTE — ED Notes (Addendum)
Pt. Came in with SOB, dizziness, sick on stomach, and chest pain mainly on the right side. Attempted to get from chair to bed and almost passed out.  Has history traveling blood clots and has stent in right leg that has been hurting. Pt is a breast cancer survivor. Has a knot under right breast approximately golf ball size. Pt has been forgetting things today/thinking very hard about answers. Family at bedside. Pt states she takes a medication for cancer.

## 2016-09-22 NOTE — ED Provider Notes (Signed)
[facility] Emergency Department Provider Note   ____________________________________________   I have reviewed the triage vital signs and the nursing notes.   HISTORY  Chief Complaint Chest Pain and Dizziness   History limited by: Not Limited   HPI Lindsey Bentley is a 71 y.o. female who presents to the emergency department today because of concerns for both dizziness and right upper chest/shoulder pain. The symptoms have been going on for a couple of days. The pain is located below her right shoulder. She states that it does "catch" at times and causes her to be short of breath. It is sharp. Addition the patient has been experiencing dizziness. This is worse with movement or sitting up or getting up. Patient states that she has a history of blood clots. She denies any recent fevers.   Past Medical History:  Diagnosis Date  . Arthritis   . Breast cancer ([place]) 2016   LT LUMPECTOMY - TI, NO, MO - IDC, ER/PR pos, Her 2 neg  . Carotid artery occlusion   . 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 Coronary artery disease   . Depression   . Diffuse cystic mastopathy 2013  . DVT (deep venous thrombosis) ([place])   . Elevated TSH   . Family history of adverse reaction to anesthesia    Pt stated that son had a seizure with a combination of anesthesia and pain medication."  . GERD (gastroesophageal reflux disease)   . Glaucoma   . High cholesterol   . History of chicken pox   . Hyperglycemia   . Hyperlipidemia   . Hypertension   . Peripheral vascular disease ([place])   . Personal history of radiation therapy 2016   BREAST CA  . Pneumonia March 2014  . Skin cancer 2013  . Sleep apnea    wears CPAP set at 2.5  . Wears glasses     Patient Active Problem List   Diagnosis Date Noted  . Carpal tunnel syndrome 04/12/2016  . Lymphedema 12/28/2015  . Carotid artery stenosis 12/28/2015  . Fullness of breast 12/19/2015  . Musculoskeletal  pain 10/18/2015  . S/P spinal surgery 10/18/2015  . 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 Neck pain 10/10/2015  . URI (upper respiratory infection) 09/15/2015  . Hypertensive left ventricular hypertrophy 06/26/2015  . Chest pain 05/08/2015  . SOB (shortness of breath) 05/08/2015  . Trigger finger, right 05/08/2015  . Combined fat and carbohydrate induced hyperlipemia 05/08/2015  . Pressure in head 02/08/2015  . Hypercholesterolemia 01/02/2015  . Breast cancer ([place]) 11/06/2014  . Health care maintenance 11/06/2014  . Ankle swelling 11/06/2014  . Stress 11/06/2014  . Neck fullness 06/29/2014  . Osteopenia 03/31/2014  . Snoring 11/05/2013  . Skin lesions 11/05/2013  . Light headedness 10/30/2013  . Hyperglycemia 02/09/2013  . Abnormal liver function test 08/05/2012  . Thyroid nodule 05/31/2012  . CAD (coronary artery disease) 05/31/2012  . Hypertension 02/24/2012  . Peripheral vascular disease ([place]) 02/24/2012  . GERD (gastroesophageal reflux disease) 02/24/2012    Past Surgical History:  Procedure Laterality Date  . BACK SURGERY  1986   ruptured disc  . BREAST BIOPSY Left 2008   NEG  . BREAST BIOPSY Left 08-20-14   POS  . BREAST BIOPSY Right 2007   NEG  . BREAST EXCISIONAL BIOPSY Left 1984   NEG  . BREAST SURGERY Left 09-05-14   left breast lumpectomy with SLN biopsy, ER/PR positive. HER2 negative.  . CHOLECYSTECTOMY  2006  . COLONOSCOPY  2010   Dr. Jamal Collin  . DILATION AND CURETTAGE OF UTERUS    . Leg stent  2011  . MOLE REMOVAL  2013   15 removed  . POSTERIOR CERVICAL LAMINECTOMY Left 10/15/2015   Procedure: Left Cervical four- five Hemilaminectomy/Remove mass;  Surgeon: Leeroy Cha, MD;  Location: [place] [facility];  Service: Neurosurgery;  Laterality: Left;  Left C4-5 Hemilaminectomy/Remove mass    Prior to Admission medications   Medication Sig Start Date End Date Taking? Authorizing Provider  aspirin (BAYER ASPIRIN) 325 MG tablet Take 325 mg by mouth daily.     [provider]  carvedilol (COREG) 12.5 MG tablet Take by mouth. 06/04/16 06/04/17  [provider]  Cholecalciferol (VITAMIN D-1000 MAX ST) 1000 units tablet Take 1,000 Units by mouth daily.     [provider]  letrozole (FEMARA) 2.5 MG tablet TAKE 1 TABLET BY MOUTH EVERY DAY 02/26/16   Christene Lye, MD  losartan-hydrochlorothiazide (HYZAAR) 100-25 MG tablet TAKE 1 TABLET BY MOUTH EVERY DAY 09/21/16   Einar Pheasant, MD  rosuvastatin (CRESTOR) 10 MG tablet TAKE 1 TABLET BY MOUTH EVERY DAY 09/21/16   Einar Pheasant, MD  vitamin E 400 UNIT capsule Take 400 Units by mouth daily.    [provider]    Allergies Codeine; Adhesive [tape]; Prednisone; and Statins  Family History  Problem Relation Age of Onset  . Cancer Father        Prostate  . Diabetes Father   . Cerebrovascular Accident Father   . Hypertension Mother   . AAA (abdominal aortic aneurysm) Mother   . Hyperlipidemia Other        Parent  . Miscarriages / Stillbirths Other        Parent  . Hypertension Other        parent  . Heart disease Other        Parent  . Breast cancer Maternal Aunt 72    Social History Social History  Substance Use Topics  . Smoking status: Never Smoker  . Smokeless tobacco: Never Used  . Alcohol use No    Review of Systems Constitutional: No fever/chills Eyes: No visual changes. ENT: No sore throat. Cardiovascular: Positive for right upper chest pain. Respiratory: Denies shortness of breath. Gastrointestinal: No abdominal pain.  No nausea, no vomiting.  No diarrhea.   Genitourinary: Negative for dysuria. Musculoskeletal: Positive for left thigh pain. Skin: Negative for rash. Neurological: Positive for dizziness.   ____________________________________________   PHYSICAL EXAM:  VITAL SIGNS: ED Triage Vitals  Enc Vitals Group     BP 09/22/16 2136 (!) 208/82     Pulse Rate 09/22/16 2136 68     Resp 09/22/16 2136 18     Temp --       Temp Source 09/22/16 2136 Oral     SpO2 09/22/16 2136 100 %     Weight 09/22/16 2137 200 lb (90.7 kg)     Height 09/22/16 2137 '5\' 5"'$  (1.651 m)     Head Circumference --      Peak Flow --      Pain Score 09/22/16 2136 5   Constitutional: Alert and oriented. Well appearing and in no distress. Eyes: Conjunctivae are normal.  ENT   Head: Normocephalic and atraumatic.   Nose: No congestion/rhinnorhea.   Mouth/Throat: Mucous membranes are moist.   Neck: No stridor. Hematological/Lymphatic/Immunilogical: No cervical lymphadenopathy. Cardiovascular: Normal rate, regular rhythm.  No murmurs, rubs, or gallops.  Respiratory: Normal  respiratory effort without tachypnea nor retractions. Breath sounds are clear and equal bilaterally. No wheezes/rales/rhonchi. Gastrointestinal: Soft and non tender. No rebound. No guarding.  Genitourinary: Deferred Musculoskeletal: Normal range of motion in all extremities. No lower extremity edema. Neurologic:  Normal speech and language. No gross focal neurologic deficits are appreciated.  Skin:  Skin is warm, dry and intact. No rash noted. Psychiatric: Mood and affect are normal. Speech and behavior are normal. Patient exhibits appropriate insight and judgment.  ____________________________________________    LABS (pertinent positives/negatives)  Labs Reviewed  BASIC METABOLIC PANEL - Abnormal; Notable for the following:       Result Value   Glucose, Bld 153 (*)    BUN 22 (*)    Creatinine, Ser 1.08 (*)    GFR calc non Af Amer 50 (*)    GFR calc Af Amer 58 (*)    All other components within normal limits  CBC  TROPONIN I  FIBRIN DERIVATIVES D-DIMER ([facility] ONLY)     ____________________________________________   EKG  Apolonio Schneiders, attending physician, personally viewed and interpreted this EKG  EKG Time: 2132 Rate: 72 Rhythm: normal sinus rhythm Axis: normal Intervals: qtc 459 QRS: narrow ST changes: no st  elevation Impression: normal ekg   ____________________________________________    RADIOLOGY  None   ____________________________________________   PROCEDURES  Procedures  ____________________________________________   INITIAL IMPRESSION / ASSESSMENT AND PLAN / ED COURSE  Pertinent labs & imaging results that were available during my care of the patient were reviewed by me and considered in my medical decision making (see chart for details).  Patient presents to the emergency department today with 2 main complaints. One for dizziness. This does seem somewhat vertiginous being that is worse with change in position and movement. The other is for pain under her right shoulder. Did have some concern for blood clots given history. Will check d-dimer. In terms of the vertigo will give meclizine.  ____________________________________________   FINAL CLINICAL IMPRESSION(S) / ED DIAGNOSES  Dizziness Right upper chest pain  Note: This dictation was prepared with Dragon dictation. Any transcriptional errors that result from this process are unintentional     Nance Pear, MD 09/22/16 [phone]

## 2016-09-23 ENCOUNTER — Emergency Department: Payer: Medicare Other

## 2016-09-23 ENCOUNTER — Encounter: Payer: Self-pay | Admitting: Radiology

## 2016-09-23 ENCOUNTER — Telehealth: Payer: Self-pay | Admitting: *Deleted

## 2016-09-23 DIAGNOSIS — J9811 Atelectasis: Secondary | ICD-10-CM | POA: Diagnosis not present

## 2016-09-23 LAB — URINALYSIS, COMPLETE (UACMP) WITH MICROSCOPIC
BACTERIA UA: NONE SEEN
BILIRUBIN URINE: NEGATIVE
Glucose, UA: NEGATIVE mg/dL
KETONES UR: NEGATIVE mg/dL
Leukocytes, UA: NEGATIVE
Nitrite: NEGATIVE
PH: 5 (ref 5.0–8.0)
PROTEIN: NEGATIVE mg/dL
Specific Gravity, Urine: 1.015 (ref 1.005–1.030)
Squamous Epithelial / LPF: NONE SEEN

## 2016-09-23 MED ORDER — ONDANSETRON 4 MG PO TBDP: 4.0000 mg | ORAL_TABLET | Freq: Three times a day (TID) | ORAL | 0 refills | Status: DC | PRN

## 2016-09-23 MED ORDER — MECLIZINE HCL 25 MG PO TABS: 25.0000 mg | ORAL_TABLET | Freq: Three times a day (TID) | ORAL | 0 refills | Status: DC | PRN

## 2016-09-23 MED ORDER — SODIUM CHLORIDE 0.9 % IV BOLUS (SEPSIS)
500.0000 mL | Freq: Once | INTRAVENOUS | Status: AC
  Administered 2016-09-23: 500 mL via INTRAVENOUS

## 2016-09-23 MED ORDER — IOPAMIDOL (ISOVUE-370) INJECTION 76%
75.0000 mL | Freq: Once | INTRAVENOUS | Status: AC | PRN
  Administered 2016-09-23: 75 mL via INTRAVENOUS

## 2016-09-23 NOTE — Discharge Instructions (Signed)
1. You may take medicines as needed for dizziness and nausea (meclizine/zofran #20). 2. Return to the ER for worsening symptoms, persistent vomiting, difficulty breathing or other concerns.

## 2016-09-23 NOTE — ED Notes (Signed)
Report off to iris rn  

## 2016-09-23 NOTE — Telephone Encounter (Signed)
Let me know where we can add her.

## 2016-09-23 NOTE — ED Notes (Signed)
Pt discharged to home.  Family member driving.  Discharge instructions reviewed.  Verbalized understanding.  No questions or concerns at this time.  Teach back verified.  Pt in NAD.  No items left in ED.   

## 2016-09-23 NOTE — ED Provider Notes (Signed)
-----------------------------------------   12:05 AM on 09/23/2016 -----------------------------------------  Care assumed from Dr. Archie Balboa. D-dimer is elevated; will obtain CTA chest to evaluate for pulmonary embolism.  ----------------------------------------- 3:16 AM on 09/23/2016 -----------------------------------------  CT chest interpreted per Dr. Randel Pigg: 1. No acute pulmonary embolus.  2. No aortic aneurysm or dissection.  3. Bibasilar pulmonary atelectasis.  4. Mild paddle megaly with 1 cm hypodensity in the left hepatic lobe  statistically more likely to represent a cyst or hemangioma.  5. Left breast masslike opacity noted which may correspond with  known lumpectomy. Please refer to mammogram report from 08/17/2016  for further disposition.   Dizziness resolved after meclizine. No complaints of pain. Updated patient and family of CT chest results. Strict return precautions given. Patient and family verbalize understanding and agree with plan of care.   Paulette Blanch, MD 09/23/16 0530

## 2016-09-23 NOTE — Telephone Encounter (Signed)
Patient was seen in the ER at [facility] on 05/16. Patient was advised to follow up with her provider in 2 day . Please give a time and date  Pt contact [phone]

## 2016-09-23 NOTE — ED Notes (Signed)
Can not use left arm for blood pressure reading.  Right arm has 2 iv's and not able to use legs due to pain and stents in leg.  md aware.  nsr on monitor.  Skin warm and dry.  Pt alert.  Family with pt.

## 2016-09-23 NOTE — Telephone Encounter (Signed)
I am not going to be here for a few days over the next week.  I can work her in during lunch on 10/01/16 (12:30).   If any acute issues prior, will need to be seen.

## 2016-09-23 NOTE — ED Notes (Signed)
EDP notified of d/c blood pressure.  Patient to take home BP medications upon arrival home.  EDP okay with this.

## 2016-09-24 NOTE — Telephone Encounter (Signed)
Patient has been scheduled

## 2016-09-25 ENCOUNTER — Telehealth: Payer: Self-pay | Admitting: Internal Medicine

## 2016-09-25 ENCOUNTER — Emergency Department: Payer: Medicare Other

## 2016-09-25 ENCOUNTER — Emergency Department
Admission: EM | Admit: 2016-09-25 | Discharge: 2016-09-25 | Disposition: A | Discharge status: Home / Self Care | Payer: Medicare Other | Attending: Emergency Medicine | Admitting: Emergency Medicine

## 2016-09-25 ENCOUNTER — Encounter: Payer: Self-pay | Admitting: Emergency Medicine

## 2016-09-25 DIAGNOSIS — I2510 Atherosclerotic heart disease of native coronary artery without angina pectoris: Secondary | ICD-10-CM | POA: Insufficient documentation

## 2016-09-25 DIAGNOSIS — M546 Pain in thoracic spine: Secondary | ICD-10-CM | POA: Insufficient documentation

## 2016-09-25 DIAGNOSIS — Z7982 Long term (current) use of aspirin: Secondary | ICD-10-CM | POA: Insufficient documentation

## 2016-09-25 DIAGNOSIS — Z853 Personal history of malignant neoplasm of breast: Secondary | ICD-10-CM | POA: Diagnosis not present

## 2016-09-25 DIAGNOSIS — Z79899 Other long term (current) drug therapy: Secondary | ICD-10-CM | POA: Diagnosis not present

## 2016-09-25 DIAGNOSIS — I10 Essential (primary) hypertension: Secondary | ICD-10-CM | POA: Diagnosis not present

## 2016-09-25 DIAGNOSIS — R109 Unspecified abdominal pain: Secondary | ICD-10-CM | POA: Diagnosis not present

## 2016-09-25 DIAGNOSIS — R079 Chest pain, unspecified: Secondary | ICD-10-CM | POA: Diagnosis not present

## 2016-09-25 LAB — BASIC METABOLIC PANEL
Anion gap: 9 (ref 5–15)
BUN: 22 mg/dL — ABNORMAL HIGH (ref 6–20)
CHLORIDE: 108 mmol/L (ref 101–111)
CO2: 25 mmol/L (ref 22–32)
Calcium: 10.5 mg/dL — ABNORMAL HIGH (ref 8.9–10.3)
Creatinine, Ser: 0.96 mg/dL (ref 0.44–1.00)
GFR calc Af Amer: >60 mL/min (ref >60)
GFR calc non Af Amer: 58 mL/min — ABNORMAL LOW (ref >60)
Glucose, Bld: 118 mg/dL — ABNORMAL HIGH (ref 65–99)
POTASSIUM: 4.3 mmol/L (ref 3.5–5.1)
Sodium: 142 mmol/L (ref 135–145)

## 2016-09-25 LAB — CBC
HEMATOCRIT: 37.6 % (ref 35.0–47.0)
Hemoglobin: 13.2 g/dL (ref 12.0–16.0)
MCH: 32.2 pg (ref 26.0–34.0)
MCHC: 35.2 g/dL (ref 32.0–36.0)
MCV: 91.4 fL (ref 80.0–100.0)
Platelets: 199 10*3/uL (ref 150–440)
RBC: 4.11 MIL/uL (ref 3.80–5.20)
RDW: 13.5 % (ref 11.5–14.5)
WBC: 5.8 10*3/uL (ref 3.6–11.0)

## 2016-09-25 LAB — TROPONIN I: Troponin I: <0.03 ng/mL (ref <0.03)

## 2016-09-25 MED ORDER — OXYCODONE-ACETAMINOPHEN 5-325 MG PO TABS
1.0000 | ORAL_TABLET | Freq: Once | ORAL | Status: AC
  Administered 2016-09-25: 1 via ORAL

## 2016-09-25 MED ORDER — OXYCODONE-ACETAMINOPHEN 5-325 MG PO TABS
ORAL_TABLET | ORAL | Status: AC
  Administered 2016-09-25: 1 via ORAL
  Filled 2016-09-25: qty 1

## 2016-09-25 MED ORDER — DICLOFENAC SODIUM 3 % TD GEL: 1.0000 "application " | Freq: Two times a day (BID) | TRANSDERMAL | 0 refills | Status: DC | PRN

## 2016-09-25 NOTE — ED Notes (Signed)
Phlebotomy called to draw patient's blood.  A full rainbow was drawn.

## 2016-09-25 NOTE — ED Notes (Signed)
Attempted blood draw x 2, lab called to come draw blood

## 2016-09-25 NOTE — Telephone Encounter (Addendum)
Advised patient that she needs to return to ER even though CT negative for PE on 09/22/16 patient has worsening SOB and Increased pain , patient D-dimer was elevated on 09/22/16. Patient was audibly SOB on phone pain scale at 10.  Patient agreed to return to ER now  Refused EMS will go by car. ER notified patient in route.

## 2016-09-25 NOTE — ED Triage Notes (Signed)
Pt to ED via POV, pt ambulatory to triage. Pt states that she was seen on Tuesday and worked up for blood clot, Chest CT was negative. Pt states that she is still having pain in the right, upper back that radiates through to her chest. Pt states that pain is more intense at times and that she is having shortness of breath because the pain is so severe. Pt is in NAD at this time, VSS, color WNL.

## 2016-09-25 NOTE — ED Provider Notes (Signed)
[facility] Emergency Department Provider Note  ____________________________________________   First MD Initiated Contact with Patient 09/25/16 1206     (approximate)  I have reviewed the triage vital signs and the nursing notes.   HISTORY  Chief Complaint Back Pain   HPI Lindsey Bentley is a 71 y.o. female who is presenting for right thoracic back pain over the past 2 days. She was evaluated in the emergency department and had labs as well as a CAT scan of the chest which revealed no pulmonary embolus. The patient continues to have moderate to severe back pain which is worse with movement. Says that it radiates around to her right upper quadrant. Says that she is a history of a cholecystectomy. No history of kidney stones. No burning with urination. Says that the pain makes her short of breath intermittently.   Past Medical History:  Diagnosis Date  . Arthritis   . Breast cancer ([place]) 2016   LT LUMPECTOMY - TI, NO, MO - IDC, ER/PR pos, Her 2 neg  . Carotid artery occlusion   . 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 Coronary artery disease   . Depression   . Diffuse cystic mastopathy 2013  . DVT (deep venous thrombosis) ([place])   . Elevated TSH   . Family history of adverse reaction to anesthesia    Pt stated that son had a seizure with a combination of anesthesia and pain medication."  . GERD (gastroesophageal reflux disease)   . Glaucoma   . High cholesterol   . History of chicken pox   . Hyperglycemia   . Hyperlipidemia   . Hypertension   . Peripheral vascular disease ([place])   . Personal history of radiation therapy 2016   BREAST CA  . Pneumonia March 2014  . Skin cancer 2013  . Sleep apnea    wears CPAP set at 2.5  . Wears glasses     Patient Active Problem List   Diagnosis Date Noted  . Carpal tunnel syndrome 04/12/2016  . Lymphedema 12/28/2015  . Carotid artery stenosis 12/28/2015  . Fullness of breast  12/19/2015  . Musculoskeletal pain 10/18/2015  . S/P spinal surgery 10/18/2015  . 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 Neck pain 10/10/2015  . URI (upper respiratory infection) 09/15/2015  . Hypertensive left ventricular hypertrophy 06/26/2015  . Chest pain 05/08/2015  . SOB (shortness of breath) 05/08/2015  . Trigger finger, right 05/08/2015  . Combined fat and carbohydrate induced hyperlipemia 05/08/2015  . Pressure in head 02/08/2015  . Hypercholesterolemia 01/02/2015  . Breast cancer ([place]) 11/06/2014  . Health care maintenance 11/06/2014  . Ankle swelling 11/06/2014  . Stress 11/06/2014  . Neck fullness 06/29/2014  . Osteopenia 03/31/2014  . Snoring 11/05/2013  . Skin lesions 11/05/2013  . Light headedness 10/30/2013  . Hyperglycemia 02/09/2013  . Abnormal liver function test 08/05/2012  . Thyroid nodule 05/31/2012  . CAD (coronary artery disease) 05/31/2012  . Hypertension 02/24/2012  . Peripheral vascular disease ([place]) 02/24/2012  . GERD (gastroesophageal reflux disease) 02/24/2012    Past Surgical History:  Procedure Laterality Date  . BACK SURGERY  1986   ruptured disc  . BREAST BIOPSY Left 2008   NEG  . BREAST BIOPSY Left 08-20-14   POS  . BREAST BIOPSY Right 2007   NEG  . BREAST EXCISIONAL BIOPSY Left 1984   NEG  . BREAST SURGERY Left 09-05-14   left breast lumpectomy with SLN biopsy, ER/PR  positive. HER2 negative.  . CHOLECYSTECTOMY  2006  . COLONOSCOPY  2010   Dr. Jamal Collin  . DILATION AND CURETTAGE OF UTERUS    . Leg stent  2011  . MOLE REMOVAL  2013   15 removed  . POSTERIOR CERVICAL LAMINECTOMY Left 10/15/2015   Procedure: Left Cervical four- five Hemilaminectomy/Remove mass;  Surgeon: Leeroy Cha, MD;  Location: [place] [facility];  Service: Neurosurgery;  Laterality: Left;  Left C4-5 Hemilaminectomy/Remove mass    Prior to Admission medications   Medication Sig Start Date End Date Taking? Authorizing Provider  aspirin (BAYER ASPIRIN) 325 MG tablet Take  325 mg by mouth daily.    [provider]  carvedilol (COREG) 12.5 MG tablet Take by mouth. 06/04/16 06/04/17  [provider]  Cholecalciferol (VITAMIN D-1000 MAX ST) 1000 units tablet Take 1,000 Units by mouth daily.     [provider]  letrozole (FEMARA) 2.5 MG tablet TAKE 1 TABLET BY MOUTH EVERY DAY 02/26/16   Christene Lye, MD  losartan-hydrochlorothiazide (HYZAAR) 100-25 MG tablet TAKE 1 TABLET BY MOUTH EVERY DAY 09/21/16   Einar Pheasant, MD  meclizine (ANTIVERT) 25 MG tablet Take 1 tablet (25 mg total) by mouth 3 (three) times daily as needed for dizziness or nausea. 09/23/16   Paulette Blanch, MD  ondansetron (ZOFRAN ODT) 4 MG disintegrating tablet Take 1 tablet (4 mg total) by mouth every 8 (eight) hours as needed for nausea or vomiting. 09/23/16   Paulette Blanch, MD  rosuvastatin (CRESTOR) 10 MG tablet TAKE 1 TABLET BY MOUTH EVERY DAY 09/21/16   Einar Pheasant, MD  vitamin E 400 UNIT capsule Take 400 Units by mouth daily.    [provider]    Allergies Codeine; Adhesive [tape]; Prednisone; and Statins  Family History  Problem Relation Age of Onset  . Cancer Father        Prostate  . Diabetes Father   . Cerebrovascular Accident Father   . Hypertension Mother   . AAA (abdominal aortic aneurysm) Mother   . Hyperlipidemia Other        Parent  . Miscarriages / Stillbirths Other        Parent  . Hypertension Other        parent  . Heart disease Other        Parent  . Breast cancer Maternal Aunt 72    Social History Social History  Substance Use Topics  . Smoking status: Never Smoker  . Smokeless tobacco: Never Used  . Alcohol use No    Review of Systems  Constitutional: No fever/chills Eyes: No visual changes. ENT: No sore throat. Cardiovascular: Denies chest pain. Respiratory: as above Gastrointestinal: No abdominal pain.  No nausea, no vomiting.  No diarrhea.  No constipation. Genitourinary: Negative for  dysuria. Musculoskeletal: as above Skin: Negative for rash. Neurological: Negative for headaches, focal weakness or numbness.   ____________________________________________   PHYSICAL EXAM:  VITAL SIGNS: ED Triage Vitals [09/25/16 0921]  Enc Vitals Group     BP (!) 151/99     Pulse Rate 60     Resp 16     Temp 97.7 F (36.5 C)     Temp Source Oral     SpO2 100 %     Weight      Height      Head Circumference      Peak Flow      Pain Score 8     Pain Loc  Pain Edu?      Excl. in [place]?     Constitutional: Alert and oriented. Well appearing and in no acute distress. Eyes: Conjunctivae are normal. PERRL. EOMI. Head: Atraumatic. Nose: No congestion/rhinnorhea. Mouth/Throat: Mucous membranes are moist.   Neck: No stridor.   Cardiovascular: Normal rate, regular rhythm. Grossly normal heart sounds.   Respiratory: Normal respiratory effort.  No retractions. Lungs CTAB. Gastrointestinal: Soft and nontender. No distention. No abdominal bruits. No CVA tenderness. Musculoskeletal: No lower extremity tenderness nor edema.  No joint effusions.  Right-sided thoracic back pain over the rhomboid muscle groups. No rash noted. Chest pain not reproducible palpation.  Neurologic:  Normal speech and language. No gross focal neurologic deficits are appreciated. No gait instability. Skin:  Skin is warm, dry and intact. No rash noted. Psychiatric: Mood and affect are normal. Speech and behavior are normal.  ____________________________________________   LABS (all labs ordered are listed, but only abnormal results are displayed)  Labs Reviewed  BASIC METABOLIC PANEL - Abnormal; Notable for the following:       Result Value   Glucose, Bld 118 (*)    BUN 22 (*)    Calcium 10.5 (*)    GFR calc non Af Amer 58 (*)    All other components within normal limits  CBC  TROPONIN I   ____________________________________________  EKG  ED ECG REPORT I, Doran Stabler, the attending  physician, personally viewed and interpreted this ECG.   Date: 09/25/2016  EKG Time: 0928  Rate: 61  Rhythm: normal sinus rhythm  Axis: normal  Intervals:none  ST&T Change: No ST segment elevation or depression. No abnormal T-wave inversion.  ____________________________________________  RADIOLOGY  Chronic changes on the chest x-ray without any acute findings. No acute findings on the CT renal study. ____________________________________________   PROCEDURES  Procedure(s) performed:   Procedures  Critical Care performed:   ____________________________________________   INITIAL IMPRESSION / ASSESSMENT AND PLAN / ED COURSE  Pertinent labs & imaging results that were available during my care of the patient were reviewed by me and considered in my medical decision making (see chart for details).  ----------------------------------------- 2:06 PM on 09/25/2016 -----------------------------------------  I discussed the testing results with the patient. Likely musculoskeletal pain. 2 very reassuring workup this week for this pain. Worse with movement.  Pt understanding of the plan and willing to comply.        ____________________________________________   FINAL CLINICAL IMPRESSION(S) / ED DIAGNOSES  Back pain    NEW MEDICATIONS STARTED DURING THIS VISIT:  New Prescriptions   No medications on file     Note:  This document was prepared using Dragon voice recognition software and may include unintentional dictation errors.    Orbie Pyo, MD 09/25/16 [phone]

## 2016-09-25 NOTE — Telephone Encounter (Signed)
Pt called and left a voicemail stating that she was having really bad back pain that was going down shoulder and across both breasts. Pt was in the ED a couple of days ago. Pt states that she might have an infection and that the ED did do xray/ct and didn't see anything. Pt did sound to be winded and a little distressed. Please advise, thank you!  Call pt @ [phone]

## 2016-09-25 NOTE — ED Notes (Signed)
Pt alert and oriented X4, active, cooperative, pt in NAD. RR even and unlabored, color WNL.  Pt informed to return if any life threatening symptoms occur.   

## 2016-09-25 NOTE — ED Notes (Signed)
Patient transported to CT 

## 2016-10-13 ENCOUNTER — Encounter: Payer: Self-pay | Admitting: Internal Medicine

## 2016-10-13 ENCOUNTER — Ambulatory Visit (INDEPENDENT_AMBULATORY_CARE_PROVIDER_SITE_OTHER): Payer: Medicare Other | Admitting: Internal Medicine

## 2016-10-13 ENCOUNTER — Other Ambulatory Visit: Payer: Self-pay | Admitting: Internal Medicine

## 2016-10-13 ENCOUNTER — Ambulatory Visit (INDEPENDENT_AMBULATORY_CARE_PROVIDER_SITE_OTHER): Payer: Medicare Other

## 2016-10-13 ENCOUNTER — Ambulatory Visit: Payer: Medicare Other | Admitting: Internal Medicine

## 2016-10-13 VITALS — BP 150/62 | HR 66 | Temp 99.1°F | Resp 12 | Ht 65.0 in | Wt 204.8 lb

## 2016-10-13 DIAGNOSIS — I25119 Atherosclerotic heart disease of native coronary artery with unspecified angina pectoris: Secondary | ICD-10-CM | POA: Diagnosis not present

## 2016-10-13 DIAGNOSIS — M545 Low back pain, unspecified: Secondary | ICD-10-CM

## 2016-10-13 DIAGNOSIS — E1159 Type 2 diabetes mellitus with other circulatory complications: Secondary | ICD-10-CM

## 2016-10-13 DIAGNOSIS — E7800 Pure hypercholesterolemia, unspecified: Secondary | ICD-10-CM

## 2016-10-13 DIAGNOSIS — R945 Abnormal results of liver function studies: Secondary | ICD-10-CM

## 2016-10-13 DIAGNOSIS — Z0000 Encounter for general adult medical examination without abnormal findings: Secondary | ICD-10-CM | POA: Diagnosis not present

## 2016-10-13 DIAGNOSIS — I739 Peripheral vascular disease, unspecified: Secondary | ICD-10-CM

## 2016-10-13 DIAGNOSIS — I10 Essential (primary) hypertension: Secondary | ICD-10-CM

## 2016-10-13 DIAGNOSIS — C50912 Malignant neoplasm of unspecified site of left female breast: Secondary | ICD-10-CM | POA: Diagnosis not present

## 2016-10-13 DIAGNOSIS — R739 Hyperglycemia, unspecified: Secondary | ICD-10-CM

## 2016-10-13 DIAGNOSIS — M47816 Spondylosis without myelopathy or radiculopathy, lumbar region: Secondary | ICD-10-CM | POA: Diagnosis not present

## 2016-10-13 DIAGNOSIS — R319 Hematuria, unspecified: Secondary | ICD-10-CM

## 2016-10-13 DIAGNOSIS — I6523 Occlusion and stenosis of bilateral carotid arteries: Secondary | ICD-10-CM

## 2016-10-13 DIAGNOSIS — R7989 Other specified abnormal findings of blood chemistry: Secondary | ICD-10-CM

## 2016-10-13 LAB — URINALYSIS, ROUTINE W REFLEX MICROSCOPIC
BILIRUBIN URINE: NEGATIVE
Hgb urine dipstick: NEGATIVE
KETONES UR: NEGATIVE
LEUKOCYTES UA: NEGATIVE
NITRITE: NEGATIVE
Specific Gravity, Urine: 1.01 (ref 1.000–1.030)
Total Protein, Urine: NEGATIVE
Urine Glucose: NEGATIVE
Urobilinogen, UA: 0.2 (ref 0.0–1.0)
pH: 6 (ref 5.0–8.0)

## 2016-10-13 NOTE — Progress Notes (Signed)
Pre-visit discussion using our clinic review tool. No additional management support is needed unless otherwise documented below in the visit note.  

## 2016-10-13 NOTE — Progress Notes (Signed)
Order placed for ortho referral.   

## 2016-10-13 NOTE — Progress Notes (Signed)
[name], female   DOB: [birth year], 71 y.o.   MRN: [id number]   Subjective:    [name], female    DOB: Dec 21, 1945, 71 y.o.   MRN: [id number]  HPI  Patient here for a scheduled follow up/ER follow up.  Went to ER 09/22/16.  Was having right chest and shoulder pain and dizziness.  Diagnosed with vertigo.  Was given meclizine and dizziness resolved.  CT chest - no PE.  She was discharged.  She was reevaluated in the ER 09/25/16 for persistent pain.  Felt to be msk in origin.  She reports the dizziness has resolved.  The back pain she was experiencing has resolved.  No chest pain.  Breathing stable.  No acid reflux.  No abdominal pain.  Her main complaint is that of not being able to do what she used to do.  States her legs give out.  No leg pain.  Is having increased low back pain.  Is trying to walk on treadmill.  Limited by legs feeling like going to give out.    Past Medical History:  Diagnosis Date  . Arthritis   . Breast cancer ([place]) 2016   LT LUMPECTOMY - TI, NO, MO - IDC, ER/PR pos, Her 2 neg  . Carotid artery occlusion   . 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 Coronary artery disease   . Depression   . Diffuse cystic mastopathy 2013  . DVT (deep venous thrombosis) ([place])   . Elevated TSH   . Family history of adverse reaction to anesthesia    Pt stated that son had a seizure with a combination of anesthesia and pain medication."  . GERD (gastroesophageal reflux disease)   . Glaucoma   . High cholesterol   . History of chicken pox   . Hyperglycemia   . Hyperlipidemia   . Hypertension   . Peripheral vascular disease ([place])   . Personal history of radiation therapy 2016   BREAST CA  . Pneumonia March 2014  . Skin cancer 2013  . Sleep apnea    wears CPAP set at 2.5  . Wears glasses    Past Surgical History:  Procedure Laterality Date  . BACK SURGERY  1986   ruptured disc  . BREAST BIOPSY Left 2008   NEG  .  BREAST BIOPSY Left 08-20-14   POS  . BREAST BIOPSY Right 2007   NEG  . BREAST EXCISIONAL BIOPSY Left 1984   NEG  . BREAST SURGERY Left 09-05-14   left breast lumpectomy with SLN biopsy, ER/PR positive. HER2 negative.  . CHOLECYSTECTOMY  2006  . COLONOSCOPY  2010   Dr. Jamal Collin  . DILATION AND CURETTAGE OF UTERUS    . Leg stent  2011  . MOLE REMOVAL  2013   15 removed  . POSTERIOR CERVICAL LAMINECTOMY Left 10/15/2015   Procedure: Left Cervical four- five Hemilaminectomy/Remove mass;  Surgeon: Leeroy Cha, MD;  Location: [facility];  Service: Neurosurgery;  Laterality: Left;  Left C4-5 Hemilaminectomy/Remove mass   Family History  Problem Relation Age of Onset  . Cancer Father        Prostate  . Diabetes Father   . Cerebrovascular Accident Father   . Hypertension Mother   . AAA (abdominal aortic aneurysm) Mother   . Hyperlipidemia Other        Parent  . Miscarriages / Stillbirths Other        Parent  .  Hypertension Other        parent  . Heart disease Other        Parent  . Breast cancer Maternal Aunt 72   Social History   Social History  . Marital status: Married    Spouse name: N/A  . Number of children: 3  . Years of education: 12   Occupational History  . Caregiver     Social History Main Topics  . Smoking status: Never Smoker  . Smokeless tobacco: Never Used  . Alcohol use No  . Drug use: No  . Sexual activity: No   Other Topics Concern  . None   Social History Narrative   Regular exercise-mo   Caffeine Use-no     Review of Systems  Constitutional: Negative for appetite change and unexpected weight change.  HENT: Negative for congestion and sinus pressure.   Respiratory: Negative for cough, chest tightness and shortness of breath.   Cardiovascular: Negative for chest pain, palpitations and leg swelling.  Gastrointestinal: Negative for abdominal pain, diarrhea, nausea and vomiting.  Genitourinary: Negative for difficulty urinating and dysuria.    Musculoskeletal: Negative for joint swelling and myalgias.       Previous pain resolved.    Skin: Negative for color change and rash.  Neurological: Negative for dizziness, light-headedness and headaches.       Dizziness resolved.   Psychiatric/Behavioral: Negative for agitation and dysphoric mood.       Objective:    Physical Exam  Constitutional: She appears well-developed and well-nourished. No distress.  HENT:  Nose: Nose normal.  Mouth/Throat: Oropharynx is clear and moist.  Neck: Neck supple. No thyromegaly present.  Cardiovascular: Normal rate and regular rhythm.   Pulmonary/Chest: Breath sounds normal. No respiratory distress. She has no wheezes.  Abdominal: Soft. Bowel sounds are normal. There is no tenderness.  Musculoskeletal: She exhibits no edema or tenderness.  Lymphadenopathy:    She has no cervical adenopathy.  Skin: No rash noted. No erythema.  Psychiatric: She has a normal mood and affect. Her behavior is normal.    BP (!) 150/62 (BP Location: Right Arm, Patient Position: Sitting, Cuff Size: Large)   Pulse 66   Temp 99.1 F (37.3 C) (Oral)   Resp 12   Ht _0  (1.651 m)   Wt 204 lb 12.8 oz (92.9 kg)   SpO2 96%   BMI 34.08 kg/m  Wt Readings from Last 3 Encounters:  10/13/16 204 lb 12.8 oz (92.9 kg)  09/22/16 200 lb (90.7 kg)  08/26/16 205 lb (93 kg)     Lab Results  Component Value Date   WBC 5.8 09/25/2016   HGB 13.2 09/25/2016   HCT 37.6 09/25/2016   PLT 199 09/25/2016   GLUCOSE 118 (H) 09/25/2016   CHOL 173 08/03/2016   TRIG 182.0 (H) 08/03/2016   HDL 43.40 08/03/2016   LDLDIRECT 103.0 02/24/2016   LDLCALC 93 08/03/2016   ALT 27 08/03/2016   AST 20 08/03/2016   NA 142 09/25/2016   K 4.3 09/25/2016   CL 108 09/25/2016   CREATININE 0.96 09/25/2016   BUN 22 (H) 09/25/2016   CO2 25 09/25/2016   TSH 3.50 08/03/2016   INR 0.9 07/13/2012   HGBA1C 6.7 (H) 08/03/2016    Dg Chest 2 View  Result Date: 09/25/2016 CLINICAL DATA:  Chest  pain. EXAM: CHEST  2 VIEW COMPARISON:  CT scan of Sep 23, 2016.  Radiographs of Sep 22, 2016. FINDINGS: Stable cardiomediastinal silhouette. Atherosclerosis of thoracic  aorta is noted. No pneumothorax or pleural effusion is noted. Stable calcified pleural plaques are noted in both upper lobes. No acute pulmonary disease is noted. Bony thorax is unremarkable. IMPRESSION: Aortic atherosclerosis. Stable calcified pleural plaque seen in upper lobes suggesting asbestos exposure. No acute cardiopulmonary abnormality seen. Electronically Signed   By: Marijo Conception, M.D.   On: 09/25/2016 10:54   Ct Renal Stone Study  Result Date: 09/25/2016 CLINICAL DATA:  Right-sided abdominal pain for several days, initial encounter EXAM: CT ABDOMEN AND PELVIS WITHOUT CONTRAST TECHNIQUE: Multidetector CT imaging of the abdomen and pelvis was performed following the standard protocol without IV contrast. COMPARISON:  None. FINDINGS: Lower chest: Lung bases are free of acute infiltrate or sizable effusion. Stable soft tissue density is noted in the left breast consistent with prior lumpectomy. Correlation with recent mammography is recommended. Hepatobiliary: No focal liver abnormality is seen. Status post cholecystectomy. No biliary dilatation. Pancreas: Unremarkable. No pancreatic ductal dilatation or surrounding inflammatory changes. Spleen: Normal in size without focal abnormality. Adrenals/Urinary Tract: Adrenal glands are unremarkable. Kidneys are normal, without renal calculi, focal lesion, or hydronephrosis. Bladder is decompressed. Stomach/Bowel: No inflammatory or obstructive changes are noted. The appendix is well visualized and within normal limits. Vascular/Lymphatic: Aortic atherosclerosis. No enlarged abdominal or pelvic lymph nodes. Right external iliac artery stent is noted. Reproductive: Uterus and bilateral adnexa are unremarkable. Other: No abdominal wall hernia or abnormality. No abdominopelvic ascites.  Musculoskeletal: Degenerative changes of lumbar spine are noted. IMPRESSION: Changes in the left breast are noted consistent with prior lumpectomy. Correlation with recent mammogram is recommended. No acute abnormality is seen. Electronically Signed   By: Inez Catalina M.D.   On: 09/25/2016 13:11       Assessment & Plan:   Problem List Items Addressed This Visit    Abnormal liver function test    Follow liver panel.        Relevant Orders   Hepatic function panel   Back pain - Primary    Increased back pain.  She is upset because she is unable to do the things she used to do.  States legs feel like they are going to give out.  DP pulses palpable and equal bilaterally.  Discussed physical therapy for strengthening exercises.  She is concerned may aggravate her back.  Will check xray.  Consider ortho evaluation to confirm no contraindication for therapy.        Relevant Orders   DG Lumbar Spine 2-3 Views (Completed)   Breast cancer ([place])    Evaluated by Dr Jamal Collin - last 03/2016.  Mammogram 02/2016.  Recommended f/u in 6 months.  Had f/u mammogram 08/17/16.  Saw Dr Jamal Collin.  Recommended f/u in 6 months.  Left breast density noted on CT - felt to represent changes from previous lumpectomy - per pt.        CAD (coronary artery disease)    Followed by cardiology.  Stable.       Carotid artery stenosis    Evaluated by Dr Lucky Cowboy 04/14/16 - 1-39% carotid ultrasound.  Recommended f/u in 2 years.        Diabetes mellitus with circulatory complication (HCC)    Low carb diet and exercise.  Follow met b and a1c.        Relevant Orders   Hemoglobin W0J   Basic metabolic panel   Microalbumin / creatinine urine ratio   Health care maintenance   Hypercholesterolemia    On crestor.  Low cholesterol diet  and exercise.  Follow lipid panel and liver function tests.        Relevant Orders   Lipid panel   Hyperglycemia    Low carb diet and exercise.  Follow met b and a1c.        Hypertension     States her blood pressure has been averaging 179-199P systolic region.  Will continue current medication regimen.  Follow pressures.  Follow metabolic panel.        Peripheral vascular disease ([place])    Followed by Dr Lucky Cowboy.  Continue risk factor modification.         Other Visit Diagnoses    Hematuria, unspecified type       noted - urine in ER.  check urinalysis.     Relevant Orders   Urinalysis, Routine w reflex microscopic (Completed)       Einar Pheasant, MD

## 2016-10-14 ENCOUNTER — Encounter: Payer: Self-pay | Admitting: Internal Medicine

## 2016-10-14 DIAGNOSIS — M549 Dorsalgia, unspecified: Secondary | ICD-10-CM | POA: Insufficient documentation

## 2016-10-14 DIAGNOSIS — E1159 Type 2 diabetes mellitus with other circulatory complications: Secondary | ICD-10-CM | POA: Insufficient documentation

## 2016-10-14 NOTE — Assessment & Plan Note (Signed)
Follow liver panel.  

## 2016-10-14 NOTE — Assessment & Plan Note (Signed)
Evaluated by Dr Dew 04/14/16 (1-39% - carotid ultrasound).  Recommended f/u in 2 years.   

## 2016-10-14 NOTE — Assessment & Plan Note (Signed)
Followed by Dr Lucky Cowboy.  Continue risk factor modification.

## 2016-10-14 NOTE — Assessment & Plan Note (Signed)
States her blood pressure has been averaging 785-885O systolic region.  Will continue current medication regimen.  Follow pressures.  Follow metabolic panel.

## 2016-10-14 NOTE — Assessment & Plan Note (Signed)
Low carb diet and exercise.  Follow met b and a1c.   

## 2016-10-14 NOTE — Assessment & Plan Note (Signed)
Evaluated by Dr Jamal Collin - last 03/2016.  Mammogram 02/2016.  Recommended f/u in 6 months.  Had f/u mammogram 08/17/16.  Saw Dr Jamal Collin.  Recommended f/u in 6 months.  Left breast density noted on CT - felt to represent changes from previous lumpectomy - per pt.

## 2016-10-14 NOTE — Assessment & Plan Note (Signed)
On crestor.  Low cholesterol diet and exercise.  Follow lipid panel and liver function tests.   

## 2016-10-14 NOTE — Assessment & Plan Note (Signed)
Followed by cardiology. Stable.   

## 2016-10-14 NOTE — Assessment & Plan Note (Signed)
Increased back pain.  She is upset because she is unable to do the things she used to do.  States legs feel like they are going to give out.  DP pulses palpable and equal bilaterally.  Discussed physical therapy for strengthening exercises.  She is concerned may aggravate her back.  Will check xray.  Consider ortho evaluation to confirm no contraindication for therapy.

## 2016-10-22 ENCOUNTER — Other Ambulatory Visit: Payer: Self-pay | Admitting: Internal Medicine

## 2016-10-23 DIAGNOSIS — G4733 Obstructive sleep apnea (adult) (pediatric): Secondary | ICD-10-CM | POA: Diagnosis not present

## 2016-11-16 DIAGNOSIS — M9973 Connective tissue and disc stenosis of intervertebral foramina of lumbar region: Secondary | ICD-10-CM | POA: Diagnosis not present

## 2016-11-20 ENCOUNTER — Other Ambulatory Visit: Payer: Self-pay | Admitting: Internal Medicine

## 2016-12-04 DIAGNOSIS — G4733 Obstructive sleep apnea (adult) (pediatric): Secondary | ICD-10-CM | POA: Diagnosis not present

## 2016-12-15 ENCOUNTER — Other Ambulatory Visit (INDEPENDENT_AMBULATORY_CARE_PROVIDER_SITE_OTHER): Payer: Medicare Other

## 2016-12-15 DIAGNOSIS — R945 Abnormal results of liver function studies: Secondary | ICD-10-CM

## 2016-12-15 DIAGNOSIS — E7800 Pure hypercholesterolemia, unspecified: Secondary | ICD-10-CM | POA: Diagnosis not present

## 2016-12-15 DIAGNOSIS — E1159 Type 2 diabetes mellitus with other circulatory complications: Secondary | ICD-10-CM | POA: Diagnosis not present

## 2016-12-15 DIAGNOSIS — R7989 Other specified abnormal findings of blood chemistry: Secondary | ICD-10-CM

## 2016-12-15 LAB — LIPID PANEL
CHOL/HDL RATIO: 4
Cholesterol: 156 mg/dL (ref 0–200)
HDL: 38.7 mg/dL — AB (ref >39.00)
NONHDL: 117.4
Triglycerides: 236 mg/dL — ABNORMAL HIGH (ref 0.0–149.0)
VLDL: 47.2 mg/dL — AB (ref 0.0–40.0)

## 2016-12-15 LAB — BASIC METABOLIC PANEL
BUN: 17 mg/dL (ref 6–23)
CO2: 26 mEq/L (ref 19–32)
CREATININE: 0.9 mg/dL (ref 0.40–1.20)
Calcium: 9.5 mg/dL (ref 8.4–10.5)
Chloride: 105 mEq/L (ref 96–112)
GFR: 65.35 mL/min (ref >60.00)
Glucose, Bld: 130 mg/dL — ABNORMAL HIGH (ref 70–99)
Potassium: 4.1 mEq/L (ref 3.5–5.1)
SODIUM: 140 meq/L (ref 135–145)

## 2016-12-15 LAB — HEPATIC FUNCTION PANEL
ALK PHOS: 49 U/L (ref 39–117)
ALT: 21 U/L (ref 0–35)
AST: 16 U/L (ref 0–37)
Albumin: 4.2 g/dL (ref 3.5–5.2)
BILIRUBIN DIRECT: 0.1 mg/dL (ref 0.0–0.3)
Total Bilirubin: 0.6 mg/dL (ref 0.2–1.2)
Total Protein: 7.2 g/dL (ref 6.0–8.3)

## 2016-12-15 LAB — LDL CHOLESTEROL, DIRECT: LDL DIRECT: 93 mg/dL

## 2016-12-15 LAB — HEMOGLOBIN A1C: HEMOGLOBIN A1C: 6.8 % — AB (ref 4.6–6.5)

## 2016-12-17 ENCOUNTER — Ambulatory Visit (INDEPENDENT_AMBULATORY_CARE_PROVIDER_SITE_OTHER): Payer: Medicare Other | Admitting: Internal Medicine

## 2016-12-17 ENCOUNTER — Encounter: Payer: Self-pay | Admitting: Internal Medicine

## 2016-12-17 VITALS — BP 160/80 | HR 61 | Temp 99.0°F | Resp 12 | Ht 65.0 in | Wt 207.6 lb

## 2016-12-17 DIAGNOSIS — E1159 Type 2 diabetes mellitus with other circulatory complications: Secondary | ICD-10-CM

## 2016-12-17 DIAGNOSIS — Z0000 Encounter for general adult medical examination without abnormal findings: Secondary | ICD-10-CM | POA: Diagnosis not present

## 2016-12-17 DIAGNOSIS — I6523 Occlusion and stenosis of bilateral carotid arteries: Secondary | ICD-10-CM | POA: Diagnosis not present

## 2016-12-17 DIAGNOSIS — Z23 Encounter for immunization: Secondary | ICD-10-CM

## 2016-12-17 DIAGNOSIS — R945 Abnormal results of liver function studies: Secondary | ICD-10-CM

## 2016-12-17 DIAGNOSIS — Z1211 Encounter for screening for malignant neoplasm of colon: Secondary | ICD-10-CM

## 2016-12-17 DIAGNOSIS — I10 Essential (primary) hypertension: Secondary | ICD-10-CM

## 2016-12-17 DIAGNOSIS — E7800 Pure hypercholesterolemia, unspecified: Secondary | ICD-10-CM

## 2016-12-17 DIAGNOSIS — C50912 Malignant neoplasm of unspecified site of left female breast: Secondary | ICD-10-CM

## 2016-12-17 DIAGNOSIS — E041 Nontoxic single thyroid nodule: Secondary | ICD-10-CM

## 2016-12-17 DIAGNOSIS — F439 Reaction to severe stress, unspecified: Secondary | ICD-10-CM

## 2016-12-17 DIAGNOSIS — R7989 Other specified abnormal findings of blood chemistry: Secondary | ICD-10-CM

## 2016-12-17 DIAGNOSIS — M545 Low back pain, unspecified: Secondary | ICD-10-CM

## 2016-12-17 DIAGNOSIS — I25119 Atherosclerotic heart disease of native coronary artery with unspecified angina pectoris: Secondary | ICD-10-CM | POA: Diagnosis not present

## 2016-12-17 DIAGNOSIS — I739 Peripheral vascular disease, unspecified: Secondary | ICD-10-CM | POA: Diagnosis not present

## 2016-12-17 MED ORDER — LOSARTAN POTASSIUM-HCTZ 100-25 MG PO TABS: 1.0000 | ORAL_TABLET | Freq: Every day | ORAL | 1 refills | Status: DC

## 2016-12-17 NOTE — Progress Notes (Signed)
Patient ID: BREUNNA NORDMANN, female   DOB: 03-07-1946, 71 y.o.   MRN: [id number]   Subjective:    Patient ID: Lorelee Market, female    DOB: [birth year], 71 y.o.   MRN: [id number]  HPI  Patient here for her physical exam.  She reports she feels better.  She was having low back pain and leg pain.  Diagnosed with probable spinal stenosis.  Recommended PT.  She reports her back is better.  No chest pain.  No sob.  No acid reflux.  No abdominal pain.  Bowels moving.  Discussed her lab results.  Triglycerides increased.  a1c 6.8.  We discussed diet and exercise.  Discussed low carb diet.     Past Medical History:  Diagnosis Date  . Arthritis   . Breast cancer ([place]) 2016   LT LUMPECTOMY - TI, NO, MO - IDC, ER/PR pos, Her 2 neg  . Carotid artery occlusion   . 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 Coronary artery disease   . Depression   . Diffuse cystic mastopathy 2013  . DVT (deep venous thrombosis) ([place])   . Elevated TSH   . Family history of adverse reaction to anesthesia    Pt stated that son had a seizure with a combination of anesthesia and pain medication."  . GERD (gastroesophageal reflux disease)   . Glaucoma   . High cholesterol   . History of chicken pox   . Hyperglycemia   . Hyperlipidemia   . Hypertension   . Peripheral vascular disease ([place])   . Personal history of radiation therapy 2016   BREAST CA  . Pneumonia March 2014  . Skin cancer 2013  . Sleep apnea    wears CPAP set at 2.5  . Wears glasses    Past Surgical History:  Procedure Laterality Date  . BACK SURGERY  1986   ruptured disc  . BREAST BIOPSY Left 2008   NEG  . BREAST BIOPSY Left 08-20-14   POS  . BREAST BIOPSY Right 2007   NEG  . BREAST EXCISIONAL BIOPSY Left 1984   NEG  . BREAST SURGERY Left 09-05-14   left breast lumpectomy with SLN biopsy, ER/PR positive. HER2 negative.  . CHOLECYSTECTOMY  2006  . COLONOSCOPY  2010   Dr. Jamal Collin  . DILATION AND CURETTAGE OF UTERUS     . Leg stent  2011  . MOLE REMOVAL  2013   15 removed  . POSTERIOR CERVICAL LAMINECTOMY Left 10/15/2015   Procedure: Left Cervical four- five Hemilaminectomy/Remove mass;  Surgeon: Leeroy Cha, MD;  Location: [facility];  Service: Neurosurgery;  Laterality: Left;  Left C4-5 Hemilaminectomy/Remove mass   Family History  Problem Relation Age of Onset  . Cancer Father        Prostate  . Diabetes Father   . Cerebrovascular Accident Father   . Hypertension Mother   . AAA (abdominal aortic aneurysm) Mother   . Hyperlipidemia Other        Parent  . Miscarriages / Stillbirths Other        Parent  . Hypertension Other        parent  . Heart disease Other        Parent  . Breast cancer Maternal Aunt 72   Social History   Social History  . Marital status: Married    Spouse name: N/A  . Number of children: 3  . Years of education: 12   Occupational History  .  Caregiver     Social History Main Topics  . Smoking status: Never Smoker  . Smokeless tobacco: Never Used  . Alcohol use No  . Drug use: No  . Sexual activity: No   Other Topics Concern  . None   Social History Narrative   Regular exercise-mo   Caffeine Use-no    Outpatient Encounter Prescriptions as of 12/17/2016  Medication Sig  . aspirin (BAYER ASPIRIN) 325 MG tablet Take 325 mg by mouth daily.  . carvedilol (COREG) 12.5 MG tablet Take by mouth.  . Cholecalciferol (VITAMIN D-1000 MAX ST) 1000 units tablet Take 1,000 Units by mouth daily.   [name] letrozole (FEMARA) 2.5 MG tablet TAKE 1 TABLET BY MOUTH EVERY DAY  . losartan-hydrochlorothiazide (HYZAAR) 100-25 MG tablet Take 1 tablet by mouth daily.  . meclizine (ANTIVERT) 25 MG tablet Take 1 tablet (25 mg total) by mouth 3 (three) times daily as needed for dizziness or nausea.  . rosuvastatin (CRESTOR) 10 MG tablet TAKE 1 TABLET BY MOUTH EVERY DAY  . vitamin E 400 UNIT capsule Take 400 Units by mouth daily.  . [DISCONTINUED] losartan-hydrochlorothiazide (HYZAAR)  100-25 MG tablet TAKE 1 TABLET BY MOUTH EVERY DAY   No facility-administered encounter medications on file as of 12/17/2016.     Review of Systems  Constitutional: Negative for appetite change and unexpected weight change.  HENT: Negative for congestion and sinus pressure.   Eyes: Negative for pain and visual disturbance.  Respiratory: Negative for cough, chest tightness and shortness of breath.   Cardiovascular: Negative for chest pain, palpitations and leg swelling.  Gastrointestinal: Negative for abdominal pain, diarrhea, nausea and vomiting.  Genitourinary: Negative for difficulty urinating and dysuria.  Musculoskeletal: Negative for joint swelling.       Back is better.   Skin: Negative for color change and rash.  Neurological: Negative for dizziness, light-headedness and headaches.  Hematological: Negative for adenopathy. Does not bruise/bleed easily.  Psychiatric/Behavioral: Negative for agitation and dysphoric mood.      Objective:    Physical Exam  Constitutional: She appears well-developed and well-nourished. No distress.  HENT:  Nose: Nose normal.  Mouth/Throat: Oropharynx is clear and moist.  Eyes: Conjunctivae are normal. Right eye exhibits no discharge. Left eye exhibits no discharge.  Neck: Neck supple.  Cardiovascular: Normal rate and regular rhythm.   Pulmonary/Chest: Breath sounds normal. No respiratory distress. She has no wheezes.  Breast exam:  No nipple discharge or nipple retraction present.  Increased fullness and firm area - left lateral breast.  Unchanged.  Well healed incision site.  No axillary adenopathy.    Abdominal: Soft. Bowel sounds are normal. There is no tenderness.  Musculoskeletal: She exhibits no edema or tenderness.  Lymphadenopathy:    She has no cervical adenopathy.  Skin: No rash noted. No erythema.  Psychiatric: She has a normal mood and affect. Her behavior is normal.    BP (!) 160/80 (BP Location: Left Arm, Patient Position:  Sitting, Cuff Size: Normal)   Pulse 61   Temp 99 F (37.2 C) (Oral)   Resp 12   Ht 5\' 5"  (1.651 m)   Wt 207 lb 9.6 oz (94.2 kg)   SpO2 96%   BMI 34.55 kg/m  Wt Readings from Last 3 Encounters:  12/17/16 207 lb 9.6 oz (94.2 kg)  10/13/16 204 lb 12.8 oz (92.9 kg)  09/22/16 200 lb (90.7 kg)     Lab Results  Component Value Date   WBC 5.8 09/25/2016   HGB 13.2  09/25/2016   HCT 37.6 09/25/2016   PLT 199 09/25/2016   GLUCOSE 130 (H) 12/15/2016   CHOL 156 12/15/2016   TRIG 236.0 (H) 12/15/2016   HDL 38.70 (L) 12/15/2016   LDLDIRECT 93.0 12/15/2016   LDLCALC 93 08/03/2016   ALT 21 12/15/2016   AST 16 12/15/2016   NA 140 12/15/2016   K 4.1 12/15/2016   CL 105 12/15/2016   CREATININE 0.90 12/15/2016   BUN 17 12/15/2016   CO2 26 12/15/2016   TSH 3.50 08/03/2016   INR 0.9 07/13/2012   HGBA1C 6.8 (H) 12/15/2016   MICROALBUR 0.3 12/17/2016    Dg Chest 2 View  Result Date: 09/25/2016 CLINICAL DATA:  Chest pain. EXAM: CHEST  2 VIEW COMPARISON:  CT scan of Sep 23, 2016.  Radiographs of Sep 22, 2016. FINDINGS: Stable cardiomediastinal silhouette. Atherosclerosis of thoracic aorta is noted. No pneumothorax or pleural effusion is noted. Stable calcified pleural plaques are noted in both upper lobes. No acute pulmonary disease is noted. Bony thorax is unremarkable. IMPRESSION: Aortic atherosclerosis. Stable calcified pleural plaque seen in upper lobes suggesting asbestos exposure. No acute cardiopulmonary abnormality seen. Electronically Signed   By: Marijo Conception, M.D.   On: 09/25/2016 10:54   Ct Renal Stone Study  Result Date: 09/25/2016 CLINICAL DATA:  Right-sided abdominal pain for several days, initial encounter EXAM: CT ABDOMEN AND PELVIS WITHOUT CONTRAST TECHNIQUE: Multidetector CT imaging of the abdomen and pelvis was performed following the standard protocol without IV contrast. COMPARISON:  None. FINDINGS: Lower chest: Lung bases are free of acute infiltrate or sizable  effusion. Stable soft tissue density is noted in the left breast consistent with prior lumpectomy. Correlation with recent mammography is recommended. Hepatobiliary: No focal liver abnormality is seen. Status post cholecystectomy. No biliary dilatation. Pancreas: Unremarkable. No pancreatic ductal dilatation or surrounding inflammatory changes. Spleen: Normal in size without focal abnormality. Adrenals/Urinary Tract: Adrenal glands are unremarkable. Kidneys are normal, without renal calculi, focal lesion, or hydronephrosis. Bladder is decompressed. Stomach/Bowel: No inflammatory or obstructive changes are noted. The appendix is well visualized and within normal limits. Vascular/Lymphatic: Aortic atherosclerosis. No enlarged abdominal or pelvic lymph nodes. Right external iliac artery stent is noted. Reproductive: Uterus and bilateral adnexa are unremarkable. Other: No abdominal wall hernia or abnormality. No abdominopelvic ascites. Musculoskeletal: Degenerative changes of lumbar spine are noted. IMPRESSION: Changes in the left breast are noted consistent with prior lumpectomy. Correlation with recent mammogram is recommended. No acute abnormality is seen. Electronically Signed   By: Inez Catalina M.D.   On: 09/25/2016 13:11       Assessment & Plan:   Problem List Items Addressed This Visit    Abnormal liver function test    Follow liver panel.       Back pain    Back pain is better.  Follow.        Breast cancer ([place])    Evaluated by Dr Jamal Collin.  Had f/u mammogram 08/17/16.  Saw Dr Jamal Collin.  Recommended f/u in 6 months.        CAD (coronary artery disease)    Followed by cardiology.  Continue risk factor modification.  Follow.       Relevant Medications   losartan-hydrochlorothiazide (HYZAAR) 100-25 MG tablet   Carotid artery stenosis    Evaluated by Dr Lucky Cowboy 04/14/16 - 1-39% carotid ultrasound.  Recommended f/u in two years.        Relevant Medications   losartan-hydrochlorothiazide (HYZAAR)  100-25 MG tablet   Diabetes  mellitus with circulatory complication (HCC)    Low carb diet and exercise.  Follow met b and a1c.        Relevant Medications   losartan-hydrochlorothiazide (HYZAAR) 100-25 MG tablet   Other Relevant Orders   Hemoglobin U3J   Basic metabolic panel   Health care maintenance    Physical today 12/16/16.  Schedule for f/u colonoscopy.  She is overdue.  Mammogram followed by Dr Jamal Collin.        Hypercholesterolemia    On crestor.  LDL 93.  Triglycerides increased.  Low cholesterol diet and exercise.  Discussed low carb diet.  Follow lipid panel and liver function tests.        Relevant Medications   losartan-hydrochlorothiazide (HYZAAR) 100-25 MG tablet   Other Relevant Orders   Hepatic function panel   Lipid panel   Hypertension    Blood pressure on recheck today improved.  Her blood pressures at home have been doing well.  States averaging 497-026 systolic.  Continue same medication regimen.  Have her spot check her pressure.  Follow.        Relevant Medications   losartan-hydrochlorothiazide (HYZAAR) 100-25 MG tablet   Peripheral vascular disease ([place])    Evaluated by Dr Lucky Cowboy 04/14/16.  Recommended f/u in 2 years.        Relevant Medications   losartan-hydrochlorothiazide (HYZAAR) 100-25 MG tablet   Stress    Doing better.  Follow.        Thyroid nodule    Was followed by Dr Gabriel Carina.  Biopsy negative.  Follow thyroid function tests.         Other Visit Diagnoses    Routine general medical examination at a health care facility    -  Primary   Need for pneumococcal vaccination       Colon cancer screening       Relevant Orders   Ambulatory referral to Gastroenterology       Einar Pheasant, MD

## 2016-12-17 NOTE — Assessment & Plan Note (Addendum)
Physical today 12/16/16.  Schedule for f/u colonoscopy.  She is overdue.  Mammogram followed by Dr Jamal Collin.

## 2016-12-18 LAB — MICROALBUMIN / CREATININE URINE RATIO
CREATININE, URINE: 185 mg/dL (ref 20–320)
MICROALB UR: 0.3 mg/dL
Microalb Creat Ratio: 2 mcg/mg creat (ref <30)

## 2016-12-20 ENCOUNTER — Encounter: Payer: Self-pay | Admitting: Internal Medicine

## 2016-12-20 NOTE — Assessment & Plan Note (Signed)
Doing better.  Follow.   

## 2016-12-20 NOTE — Assessment & Plan Note (Signed)
Low carb diet and exercise.  Follow met b and a1c.   

## 2016-12-20 NOTE — Assessment & Plan Note (Addendum)
Evaluated by Dr Dew 04/14/16.  Recommended f/u in 2 years.   

## 2016-12-20 NOTE — Assessment & Plan Note (Signed)
Follow liver panel.  

## 2016-12-20 NOTE — Assessment & Plan Note (Signed)
Blood pressure on recheck today improved.  Her blood pressures at home have been doing well.  States averaging 945-038 systolic.  Continue same medication regimen.  Have her spot check her pressure.  Follow.

## 2016-12-20 NOTE — Assessment & Plan Note (Signed)
Evaluated by Dr Jamal Collin.  Had f/u mammogram 08/17/16.  Saw Dr Jamal Collin.  Recommended f/u in 6 months.

## 2016-12-20 NOTE — Assessment & Plan Note (Signed)
Evaluated by Dr Lucky Cowboy 04/14/16 - 1-39% carotid ultrasound.  Recommended f/u in two years.

## 2016-12-20 NOTE — Assessment & Plan Note (Signed)
On crestor.  LDL 93.  Triglycerides increased.  Low cholesterol diet and exercise.  Discussed low carb diet.  Follow lipid panel and liver function tests.

## 2016-12-20 NOTE — Assessment & Plan Note (Signed)
Back pain is better.  Follow.

## 2016-12-20 NOTE — Assessment & Plan Note (Signed)
Was followed by Dr Gabriel Carina.  Biopsy negative.  Follow thyroid function tests.

## 2016-12-20 NOTE — Assessment & Plan Note (Signed)
Followed by cardiology.  Continue risk factor modification.  Follow.

## 2016-12-24 DIAGNOSIS — G4733 Obstructive sleep apnea (adult) (pediatric): Secondary | ICD-10-CM | POA: Diagnosis not present

## 2017-01-08 ENCOUNTER — Other Ambulatory Visit: Payer: Self-pay

## 2017-01-08 DIAGNOSIS — Z170 Estrogen receptor positive status [ER+]: Principal | ICD-10-CM

## 2017-01-08 DIAGNOSIS — C50512 Malignant neoplasm of lower-outer quadrant of left female breast: Secondary | ICD-10-CM

## 2017-01-29 DIAGNOSIS — G4733 Obstructive sleep apnea (adult) (pediatric): Secondary | ICD-10-CM | POA: Diagnosis not present

## 2017-02-12 DIAGNOSIS — Z8371 Family history of colonic polyps: Secondary | ICD-10-CM | POA: Diagnosis not present

## 2017-02-12 DIAGNOSIS — R1012 Left upper quadrant pain: Secondary | ICD-10-CM | POA: Diagnosis not present

## 2017-02-16 ENCOUNTER — Other Ambulatory Visit: Payer: Self-pay | Admitting: General Surgery

## 2017-02-23 ENCOUNTER — Ambulatory Visit
Admission: RE | Admit: 2017-02-23 | Discharge: 2017-02-23 | Disposition: A | Discharge status: Home / Self Care | Payer: Medicare Other | Source: Ambulatory Visit | Attending: General Surgery | Admitting: General Surgery

## 2017-02-23 DIAGNOSIS — R921 Mammographic calcification found on diagnostic imaging of breast: Secondary | ICD-10-CM | POA: Diagnosis not present

## 2017-02-23 DIAGNOSIS — Z170 Estrogen receptor positive status [ER+]: Secondary | ICD-10-CM | POA: Insufficient documentation

## 2017-02-23 DIAGNOSIS — C50512 Malignant neoplasm of lower-outer quadrant of left female breast: Secondary | ICD-10-CM | POA: Diagnosis not present

## 2017-03-04 DIAGNOSIS — G4733 Obstructive sleep apnea (adult) (pediatric): Secondary | ICD-10-CM | POA: Diagnosis not present

## 2017-03-08 DIAGNOSIS — I10 Essential (primary) hypertension: Secondary | ICD-10-CM | POA: Diagnosis not present

## 2017-03-08 DIAGNOSIS — K219 Gastro-esophageal reflux disease without esophagitis: Secondary | ICD-10-CM | POA: Diagnosis not present

## 2017-03-08 DIAGNOSIS — G4733 Obstructive sleep apnea (adult) (pediatric): Secondary | ICD-10-CM | POA: Diagnosis not present

## 2017-03-08 DIAGNOSIS — I259 Chronic ischemic heart disease, unspecified: Secondary | ICD-10-CM | POA: Diagnosis not present

## 2017-03-09 ENCOUNTER — Other Ambulatory Visit
Admission: RE | Admit: 2017-03-09 | Discharge: 2017-03-09 | Disposition: A | Discharge status: Home / Self Care | Payer: Medicare Other | Source: Ambulatory Visit | Attending: General Surgery | Admitting: General Surgery

## 2017-03-09 ENCOUNTER — Encounter: Payer: Self-pay | Admitting: General Surgery

## 2017-03-09 ENCOUNTER — Ambulatory Visit (INDEPENDENT_AMBULATORY_CARE_PROVIDER_SITE_OTHER): Payer: Medicare Other | Admitting: General Surgery

## 2017-03-09 VITALS — BP 124/70 | HR 70 | Resp 12 | Ht 64.0 in | Wt 201.0 lb

## 2017-03-09 DIAGNOSIS — C50512 Malignant neoplasm of lower-outer quadrant of left female breast: Secondary | ICD-10-CM

## 2017-03-09 DIAGNOSIS — I499 Cardiac arrhythmia, unspecified: Secondary | ICD-10-CM

## 2017-03-09 DIAGNOSIS — I119 Hypertensive heart disease without heart failure: Secondary | ICD-10-CM | POA: Diagnosis not present

## 2017-03-09 DIAGNOSIS — Z170 Estrogen receptor positive status [ER+]: Secondary | ICD-10-CM

## 2017-03-09 DIAGNOSIS — I10 Essential (primary) hypertension: Secondary | ICD-10-CM | POA: Diagnosis not present

## 2017-03-09 DIAGNOSIS — E782 Mixed hyperlipidemia: Secondary | ICD-10-CM | POA: Diagnosis not present

## 2017-03-09 DIAGNOSIS — R600 Localized edema: Secondary | ICD-10-CM | POA: Diagnosis not present

## 2017-03-09 NOTE — Progress Notes (Signed)
[name], female   DOB: 04/14/1946, 71 y.o.   MRN: [id number]  Chief Complaint  Patient presents with  . Follow-up    HPI Lindsey Bentley is a 71 y.o. female.  who presents for her follow up left breast cancer diagnosed 08/2014 and a breast evaluation. The most recent left mammogram was done on 02-23-17.  Patient does perform regular self breast checks and gets regular mammograms done.  Tolerating letrozole, with occasional hot flashes.  She has seen Dr Nehemiah Massed as her Cardiologist but has no appointments scheduled at this time.   HPI  Past Medical History:  Diagnosis Date  . Arthritis   . Breast cancer ([place]) 2016   LT LUMPECTOMY - TI, NO, MO - IDC, ER/PR pos, Her 2 neg  . Carotid artery occlusion   . 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 Coronary artery disease   . Depression   . Diffuse cystic mastopathy 2013  . DVT (deep venous thrombosis) ([place])   . Elevated TSH   . Family history of adverse reaction to anesthesia    Pt stated that son had a seizure with a combination of anesthesia and pain medication."  . GERD (gastroesophageal reflux disease)   . Glaucoma   . High cholesterol   . History of chicken pox   . Hyperglycemia   . Hyperlipidemia   . Hypertension   . Peripheral vascular disease ([place])   . Personal history of radiation therapy 2016   BREAST CA  . Pneumonia March 2014  . Skin cancer 2013  . Sleep apnea    wears CPAP set at 2.5  . Wears glasses     Past Surgical History:  Procedure Laterality Date  . BACK SURGERY  1986   ruptured disc  . BREAST BIOPSY Left 2008   NEG  . BREAST BIOPSY Left 08-20-14   POS  . BREAST BIOPSY Right 2007   NEG  . BREAST EXCISIONAL BIOPSY Left 1984   NEG  . 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 BREAST SURGERY Left 09-05-14   left breast lumpectomy with SLN biopsy, ER/PR positive. HER2 negative.  . CHOLECYSTECTOMY  2006  . COLONOSCOPY  2010   Dr. Jamal Collin  . DILATION AND  CURETTAGE OF UTERUS    . Leg stent  2011  . MOLE REMOVAL  2013   15 removed  . POSTERIOR CERVICAL LAMINECTOMY Left 10/15/2015   Procedure: Left Cervical four- five Hemilaminectomy/Remove mass;  Surgeon: Leeroy Cha, MD;  Location: [facility];  Service: Neurosurgery;  Laterality: Left;  Left C4-5 Hemilaminectomy/Remove mass    Family History  Problem Relation Age of Onset  . Cancer Father        Prostate  . Diabetes Father   . Cerebrovascular Accident Father   . Hypertension Mother   . AAA (abdominal aortic aneurysm) Mother   . Hyperlipidemia Other        Parent  . Miscarriages / Stillbirths Other        Parent  . Hypertension Other        parent  . Heart disease Other        Parent  . Breast cancer Maternal Aunt 72    Social History Social History  Substance Use Topics  . Smoking status: Never Smoker  . Smokeless tobacco: Never Used  . Alcohol use No    Allergies  Allergen Reactions  . Codeine Nausea And Vomiting  . Adhesive [  Tape] Itching and Rash    Please use "paper" tape  . Prednisone Swelling  . Statins Other (See Comments)    Muscle Pain, can take Crestor    Current Outpatient Prescriptions  Medication Sig Dispense Refill  . aspirin (BAYER ASPIRIN) 325 MG tablet Take 325 mg by mouth daily.    . carvedilol (COREG) 12.5 MG tablet Take by mouth.    . Cholecalciferol (VITAMIN D-1000 MAX ST) 1000 units tablet Take 1,000 Units by mouth daily.     [name] letrozole (FEMARA) 2.5 MG tablet TAKE 1 TABLET BY MOUTH EVERY DAY 30 tablet 0  . losartan-hydrochlorothiazide (HYZAAR) 100-25 MG tablet Take 1 tablet by mouth daily. 90 tablet 1  . meclizine (ANTIVERT) 25 MG tablet Take 1 tablet (25 mg total) by mouth 3 (three) times daily as needed for dizziness or nausea. 20 tablet 0  . rosuvastatin (CRESTOR) 10 MG tablet TAKE 1 TABLET BY MOUTH EVERY DAY 30 tablet 5  . vitamin E 400 UNIT capsule Take 400 Units by mouth daily.     No current facility-administered medications for  this visit.     Review of Systems Review of Systems  Constitutional: Negative.   Respiratory: Negative.   Cardiovascular: Negative.     Blood pressure 124/70, pulse 70, resp. rate 12, height '5\' 4"'$  (1.626 m), weight 201 lb (91.2 kg).  Physical Exam Physical Exam  Constitutional: She is oriented to person, place, and time. She appears well-developed and well-nourished.  HENT:  Mouth/Throat: Oropharynx is clear and moist.  Eyes: Conjunctivae are normal. No scleral icterus.  Neck: Neck supple.  Cardiovascular: Normal rate and normal heart sounds.  An irregular rhythm present.  Pulmonary/Chest: Effort normal and breath sounds normal. Right breast exhibits no inverted nipple, no mass, no nipple discharge, no skin change and no tenderness. Left breast exhibits no inverted nipple, no mass, no nipple discharge, no skin change and no tenderness.  Left breast lumpectomy site well healed with firm seroma cavity lower outer quadrant 4-5 ocl  Abdominal: Soft. There is no tenderness.  Lymphadenopathy:    She has no cervical adenopathy.    She has no axillary adenopathy.  Neurological: She is alert and oriented to person, place, and time.  Skin: Skin is warm and dry.  Psychiatric: Her behavior is normal.    Data Reviewed Mammogram, previous echo, cardiology notes, and previous EKG Mammogram revealed no findings of malignancy in either breast, stable from previous.  Assessment    CA, left breast lower outer quadrant, T1,N0,ER/PR pos,Her 2 neg Currently on letrozole and doing well. No concerning findings on mammogram.   No conduction abnormalities noted in previous cardiology notes, EKG, or echo. Vitals and US doppler today in office revealed an irregular rhythm suspicious for bigeminy. This is a new finding. Would need an EKG to confirm.    Plan    Get labs drawn today- CA 27-29  Call to Dr Nicki Reaper for irregular heart beat.  The patient has been asked to return to the office in 6 months  with a bilateral diagnostic mammogram with Dr Bary Castilla.    The patient will see Dr Nehemiah Massed today at 4:00 pm.  HPI, Physical Exam, Assessment and Plan have been scribed under the direction and in the presence of Mckinley Jewel, MD Karie Fetch, RN I have completed the exam and reviewed the above documentation for accuracy and completeness.  I agree with the above.  Haematologist has been used and any errors in dictation or transcription  are unintentional.  Elija Mccamish G. Jamal Collin, M.D., F.A.C.S.   Junie Panning G 03/10/2017, 11:21 AM

## 2017-03-09 NOTE — Patient Instructions (Addendum)
  The patient has been asked to return to the office in 6 months with a bilateral diagnostic mammogram with Dr Bary Castilla

## 2017-03-10 ENCOUNTER — Telehealth: Payer: Self-pay | Admitting: *Deleted

## 2017-03-10 LAB — CANCER ANTIGEN 27.29: CAN 27.29: 9.4 U/mL (ref 0.0–38.6)

## 2017-03-10 NOTE — Telephone Encounter (Signed)
Notified patient as instructed, patient pleased. Discussed follow-up appointments, patient agrees  

## 2017-03-10 NOTE — Telephone Encounter (Signed)
-----   Message from Christene Lye, MD sent at 03/10/2017 11:09 AM EDT ----- Please inform pt- normal value

## 2017-03-15 DIAGNOSIS — R002 Palpitations: Secondary | ICD-10-CM | POA: Diagnosis not present

## 2017-03-15 DIAGNOSIS — I499 Cardiac arrhythmia, unspecified: Secondary | ICD-10-CM | POA: Diagnosis not present

## 2017-03-19 ENCOUNTER — Other Ambulatory Visit: Payer: Self-pay | Admitting: General Surgery

## 2017-03-22 DIAGNOSIS — I10 Essential (primary) hypertension: Secondary | ICD-10-CM | POA: Diagnosis not present

## 2017-03-22 DIAGNOSIS — R079 Chest pain, unspecified: Secondary | ICD-10-CM | POA: Diagnosis not present

## 2017-03-22 DIAGNOSIS — I491 Atrial premature depolarization: Secondary | ICD-10-CM | POA: Insufficient documentation

## 2017-03-22 DIAGNOSIS — R0602 Shortness of breath: Secondary | ICD-10-CM | POA: Diagnosis not present

## 2017-03-22 DIAGNOSIS — R002 Palpitations: Secondary | ICD-10-CM | POA: Insufficient documentation

## 2017-04-15 ENCOUNTER — Other Ambulatory Visit (INDEPENDENT_AMBULATORY_CARE_PROVIDER_SITE_OTHER): Payer: Medicare Other

## 2017-04-15 DIAGNOSIS — E1159 Type 2 diabetes mellitus with other circulatory complications: Secondary | ICD-10-CM

## 2017-04-15 DIAGNOSIS — E7800 Pure hypercholesterolemia, unspecified: Secondary | ICD-10-CM | POA: Diagnosis not present

## 2017-04-15 LAB — HEPATIC FUNCTION PANEL
ALBUMIN: 4.4 g/dL (ref 3.5–5.2)
ALT: 26 U/L (ref 0–35)
AST: 17 U/L (ref 0–37)
Alkaline Phosphatase: 56 U/L (ref 39–117)
Bilirubin, Direct: 0.1 mg/dL (ref 0.0–0.3)
Total Bilirubin: 0.5 mg/dL (ref 0.2–1.2)
Total Protein: 7 g/dL (ref 6.0–8.3)

## 2017-04-15 LAB — BASIC METABOLIC PANEL
BUN: 23 mg/dL (ref 6–23)
CO2: 27 mEq/L (ref 19–32)
Calcium: 9.3 mg/dL (ref 8.4–10.5)
Chloride: 105 mEq/L (ref 96–112)
Creatinine, Ser: 1.02 mg/dL (ref 0.40–1.20)
GFR: 56.51 mL/min — AB (ref >60.00)
Glucose, Bld: 114 mg/dL — ABNORMAL HIGH (ref 70–99)
POTASSIUM: 4.4 meq/L (ref 3.5–5.1)
SODIUM: 140 meq/L (ref 135–145)

## 2017-04-15 LAB — LIPID PANEL
CHOLESTEROL: 130 mg/dL (ref 0–200)
HDL: 39 mg/dL — AB (ref >39.00)
LDL Cholesterol: 64 mg/dL (ref 0–99)
NonHDL: 91.08
TRIGLYCERIDES: 134 mg/dL (ref 0.0–149.0)
Total CHOL/HDL Ratio: 3
VLDL: 26.8 mg/dL (ref 0.0–40.0)

## 2017-04-15 LAB — HEMOGLOBIN A1C: HEMOGLOBIN A1C: 6.3 % (ref 4.6–6.5)

## 2017-04-22 ENCOUNTER — Ambulatory Visit (INDEPENDENT_AMBULATORY_CARE_PROVIDER_SITE_OTHER): Payer: Medicare Other | Admitting: Internal Medicine

## 2017-04-22 ENCOUNTER — Encounter: Payer: Self-pay | Admitting: Internal Medicine

## 2017-04-22 DIAGNOSIS — I10 Essential (primary) hypertension: Secondary | ICD-10-CM | POA: Diagnosis not present

## 2017-04-22 DIAGNOSIS — F439 Reaction to severe stress, unspecified: Secondary | ICD-10-CM | POA: Diagnosis not present

## 2017-04-22 DIAGNOSIS — R0602 Shortness of breath: Secondary | ICD-10-CM | POA: Diagnosis not present

## 2017-04-22 DIAGNOSIS — R945 Abnormal results of liver function studies: Secondary | ICD-10-CM

## 2017-04-22 DIAGNOSIS — I739 Peripheral vascular disease, unspecified: Secondary | ICD-10-CM | POA: Diagnosis not present

## 2017-04-22 DIAGNOSIS — I25119 Atherosclerotic heart disease of native coronary artery with unspecified angina pectoris: Secondary | ICD-10-CM

## 2017-04-22 DIAGNOSIS — E1159 Type 2 diabetes mellitus with other circulatory complications: Secondary | ICD-10-CM

## 2017-04-22 DIAGNOSIS — E7800 Pure hypercholesterolemia, unspecified: Secondary | ICD-10-CM | POA: Diagnosis not present

## 2017-04-22 DIAGNOSIS — E041 Nontoxic single thyroid nodule: Secondary | ICD-10-CM | POA: Diagnosis not present

## 2017-04-22 DIAGNOSIS — R7989 Other specified abnormal findings of blood chemistry: Secondary | ICD-10-CM

## 2017-04-22 DIAGNOSIS — C50912 Malignant neoplasm of unspecified site of left female breast: Secondary | ICD-10-CM

## 2017-04-22 NOTE — Progress Notes (Signed)
[name], female   DOB: Aug 17, 1945, 71 y.o.   MRN: [id number]   Subjective:    [name], female    DOB: September 02, 1945, 71 y.o.   MRN: [id number]  HPI  Patient here for a scheduled follow up.  She reports she is doing relatively well.  Just evaluated by Dr Nehemiah Massed.  Holter monitor revealed occasional PVCs, PACs, sinus tachycardia and asymptomatic bradycardia.  Reports increased sob with exertion.  Had stress test that was normal.  ECHO - normal EF 55%, moderate LVH, moderate MR and mild pulmonary hypertension.  Felt no further cardiac w/up warranted.  Still with sob with exertion.  Had question as to the etiology.  Discussed pulmonary evaluation.  She is in agreement.  No chest pain.  No acid reflux. No abdominal pain.  Bowels moving.  States blood pressure is averaging 120-130s.  Discussed lab results.     Past Medical History:  Diagnosis Date  . Arthritis   . Breast cancer ([place]) 2016   LT LUMPECTOMY - TI, NO, MO - IDC, ER/PR pos, Her 2 neg  . Carotid artery occlusion   . 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 Coronary artery disease   . Depression   . Diffuse cystic mastopathy 2013  . DVT (deep venous thrombosis) ([place])   . Elevated TSH   . Family history of adverse reaction to anesthesia    Pt stated that son had a seizure with a combination of anesthesia and pain medication."  . GERD (gastroesophageal reflux disease)   . Glaucoma   . High cholesterol   . History of chicken pox   . Hyperglycemia   . Hyperlipidemia   . Hypertension   . Peripheral vascular disease ([place])   . Personal history of radiation therapy 2016   BREAST CA  . Pneumonia March 2014  . Skin cancer 2013  . Sleep apnea    wears CPAP set at 2.5  . Wears glasses    Past Surgical History:  Procedure Laterality Date  . BACK SURGERY  1986   ruptured disc  . BREAST BIOPSY Left 2008   NEG  . BREAST BIOPSY Left 08-20-14   POS  . BREAST BIOPSY Right 2007    NEG  . BREAST EXCISIONAL BIOPSY Left 1984   NEG  . 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 BREAST SURGERY Left 09-05-14   left breast lumpectomy with SLN biopsy, ER/PR positive. HER2 negative.  . CHOLECYSTECTOMY  2006  . COLONOSCOPY  2010   Dr. Jamal Collin  . DILATION AND CURETTAGE OF UTERUS    . Leg stent  2011  . MOLE REMOVAL  2013   15 removed  . POSTERIOR CERVICAL LAMINECTOMY Left 10/15/2015   Procedure: Left Cervical four- five Hemilaminectomy/Remove mass;  Surgeon: Leeroy Cha, MD;  Location: [facility];  Service: Neurosurgery;  Laterality: Left;  Left C4-5 Hemilaminectomy/Remove mass   Family History  Problem Relation Age of Onset  . Cancer Father        Prostate  . Diabetes Father   . Cerebrovascular Accident Father   . Hypertension Mother   . AAA (abdominal aortic aneurysm) Mother   . Hyperlipidemia Other        Parent  . Miscarriages / Stillbirths Other        Parent  . Hypertension Other        parent  . Heart disease Other  Parent  . Breast cancer Maternal Aunt 72   Social History   Socioeconomic History  . Marital status: Married    Spouse name: None  . Number of children: 3  . Years of education: 28  . Highest education level: None  Social Needs  . Financial resource strain: None  . Food insecurity - worry: None  . Food insecurity - inability: None  . Transportation needs - medical: None  . Transportation needs - non-medical: None  Occupational History  . Occupation: Caregiver   Tobacco Use  . Smoking status: Never Smoker  . Smokeless tobacco: Never Used  Substance and Sexual Activity  . Alcohol use: No    Alcohol/week: 0.0 oz  . Drug use: No  . Sexual activity: No  Other Topics Concern  . None  Social History Narrative   Regular exercise-mo   Caffeine Use-no    Outpatient Encounter Medications as of 04/22/2017  Medication Sig  . aspirin (BAYER ASPIRIN) 325 MG tablet Take 325 mg by mouth daily.  . carvedilol (COREG) 12.5 MG  tablet Take by mouth.  . Cholecalciferol (VITAMIN D-1000 MAX ST) 1000 units tablet Take 1,000 Units by mouth daily.   [name] letrozole (FEMARA) 2.5 MG tablet TAKE 1 TABLET BY MOUTH EVERY DAY  . losartan-hydrochlorothiazide (HYZAAR) 100-25 MG tablet Take 1 tablet by mouth daily.  . meclizine (ANTIVERT) 25 MG tablet Take 1 tablet (25 mg total) by mouth 3 (three) times daily as needed for dizziness or nausea.  . rosuvastatin (CRESTOR) 10 MG tablet TAKE 1 TABLET BY MOUTH EVERY DAY  . vitamin E 400 UNIT capsule Take 400 Units by mouth daily.   No facility-administered encounter medications on file as of 04/22/2017.     Review of Systems  Constitutional: Negative for appetite change and unexpected weight change.  HENT: Negative for congestion and sinus pressure.   Respiratory: Negative for cough, chest tightness and shortness of breath.   Cardiovascular: Negative for chest pain, palpitations and leg swelling.  Gastrointestinal: Negative for abdominal pain, diarrhea, nausea and vomiting.  Genitourinary: Negative for difficulty urinating and dysuria.  Musculoskeletal: Negative for joint swelling and myalgias.  Skin: Negative for color change and rash.  Neurological: Negative for dizziness, light-headedness and headaches.  Psychiatric/Behavioral: Negative for agitation and dysphoric mood.       Objective:    Physical Exam  Constitutional: She appears well-developed and well-nourished. No distress.  HENT:  Nose: Nose normal.  Mouth/Throat: Oropharynx is clear and moist.  Neck: Neck supple. No thyromegaly present.  Cardiovascular: Normal rate and regular rhythm.  Pulmonary/Chest: Breath sounds normal. No respiratory distress. She has no wheezes.  Abdominal: Soft. Bowel sounds are normal. There is no tenderness.  Musculoskeletal: She exhibits no edema or tenderness.  Lymphadenopathy:    She has no cervical adenopathy.  Skin: No rash noted. No erythema.  Psychiatric: She has a normal mood and  affect. Her behavior is normal.    BP 132/72 (BP Location: Right Arm, Patient Position: Sitting, Cuff Size: Normal)   Pulse 65   Temp 97.9 F (36.6 C) (Oral)   Wt 202 lb 12.8 oz (92 kg)   BMI 34.81 kg/m  Wt Readings from Last 3 Encounters:  04/22/17 202 lb 12.8 oz (92 kg)  03/09/17 201 lb (91.2 kg)  12/17/16 207 lb 9.6 oz (94.2 kg)     Lab Results  Component Value Date   WBC 5.8 09/25/2016   HGB 13.2 09/25/2016   HCT 37.6 09/25/2016   PLT  199 09/25/2016   GLUCOSE 114 (H) 04/15/2017   CHOL 130 04/15/2017   TRIG 134.0 04/15/2017   HDL 39.00 (L) 04/15/2017   LDLDIRECT 93.0 12/15/2016   LDLCALC 64 04/15/2017   ALT 26 04/15/2017   AST 17 04/15/2017   NA 140 04/15/2017   K 4.4 04/15/2017   CL 105 04/15/2017   CREATININE 1.02 04/15/2017   BUN 23 04/15/2017   CO2 27 04/15/2017   TSH 3.50 08/03/2016   INR 0.9 07/13/2012   HGBA1C 6.3 04/15/2017   MICROALBUR 0.3 12/17/2016       Assessment & Plan:   Problem List Items Addressed This Visit    Abnormal liver function test    Follow liver panel.        Breast cancer ([place])    Followed by Dr Jamal Collin.  Mammogram in 08/2016 and 02/2017.  Recommended f/u in 12 months.        CAD (coronary artery disease)    Just evaluated by cardiology.  Stress test negative.  ECHO as outlined.  Felt no further w/up warranted.  Continue risk factor modification.        Diabetes mellitus with circulatory complication (HCC)    Low carb diet and exercise.  Follow met b and a1c.        Relevant Orders   Hemoglobin R0B   Basic metabolic panel   Hypercholesterolemia    On crestor.  Triglycerides improved.  LDL 64.  Continue diet and exercise.  Follow lipid panel and liver function tests.        Relevant Orders   Lipid panel   Hepatic function panel   Hypertension    Blood pressure as outlined.  Continue same medication regimen.  Follow pressures.  Follow metabolic panel.        Relevant Orders   TSH   Peripheral vascular disease  ([place])    Evaluated by Dr Lucky Cowboy 04/14/16.  Recommended f/u in 2 years.        SOB (shortness of breath)    Continued sob as outlined.  Had cardiac w/up as outlined.  No further w/up warranted.  Given persistent symptoms, will refer to pulmonary for further evaluation.        Relevant Orders   Ambulatory referral to Pulmonology   Stress    Handling stress.  Follow.       Thyroid nodule    Was followed by Dr Gabriel Carina.  Biopsy negative.  Follow thyroid function.            Einar Pheasant, MD

## 2017-04-25 ENCOUNTER — Encounter: Payer: Self-pay | Admitting: Internal Medicine

## 2017-04-25 NOTE — Assessment & Plan Note (Signed)
Low carb diet and exercise.  Follow met b and a1c.   

## 2017-04-25 NOTE — Assessment & Plan Note (Signed)
On crestor.  Triglycerides improved.  LDL 64.  Continue diet and exercise.  Follow lipid panel and liver function tests.

## 2017-04-25 NOTE — Assessment & Plan Note (Signed)
Was followed by Dr Gabriel Carina.  Biopsy negative.  Follow thyroid function.

## 2017-04-25 NOTE — Assessment & Plan Note (Signed)
Follow liver panel.  

## 2017-04-25 NOTE — Assessment & Plan Note (Signed)
Continued sob as outlined.  Had cardiac w/up as outlined.  No further w/up warranted.  Given persistent symptoms, will refer to pulmonary for further evaluation.

## 2017-04-25 NOTE — Assessment & Plan Note (Signed)
Just evaluated by cardiology.  Stress test negative.  ECHO as outlined.  Felt no further w/up warranted.  Continue risk factor modification.

## 2017-04-25 NOTE — Assessment & Plan Note (Signed)
Blood pressure as outlined.  Continue same medication regimen.  Follow pressures.  Follow metabolic panel.  

## 2017-04-25 NOTE — Assessment & Plan Note (Addendum)
Followed by Dr Jamal Collin.  Mammogram in 08/2016 and 02/2017.  Recommended f/u in 12 months.

## 2017-04-25 NOTE — Assessment & Plan Note (Signed)
Evaluated by Dr Lucky Cowboy 04/14/16.  Recommended f/u in 2 years.

## 2017-04-25 NOTE — Assessment & Plan Note (Signed)
Handling stress.  Follow.   

## 2017-04-28 ENCOUNTER — Encounter: Payer: Self-pay | Admitting: *Deleted

## 2017-05-13 DIAGNOSIS — G4733 Obstructive sleep apnea (adult) (pediatric): Secondary | ICD-10-CM | POA: Diagnosis not present

## 2017-05-17 ENCOUNTER — Ambulatory Visit: Payer: Medicare Other | Admitting: Anesthesiology

## 2017-05-17 ENCOUNTER — Encounter
Admission: RE | Disposition: A | Discharge status: Home / Self Care | Payer: Self-pay | Source: Ambulatory Visit | Attending: Unknown Physician Specialty

## 2017-05-17 ENCOUNTER — Ambulatory Visit
Admission: RE | Admit: 2017-05-17 | Discharge: 2017-05-17 | Disposition: A | Discharge status: Home / Self Care | Payer: Medicare Other | Source: Ambulatory Visit | Attending: Unknown Physician Specialty | Admitting: Unknown Physician Specialty

## 2017-05-17 ENCOUNTER — Encounter: Payer: Self-pay | Admitting: Anesthesiology

## 2017-05-17 ENCOUNTER — Telehealth: Payer: Self-pay | Admitting: Internal Medicine

## 2017-05-17 DIAGNOSIS — F329 Major depressive disorder, single episode, unspecified: Secondary | ICD-10-CM | POA: Diagnosis not present

## 2017-05-17 DIAGNOSIS — K219 Gastro-esophageal reflux disease without esophagitis: Secondary | ICD-10-CM | POA: Insufficient documentation

## 2017-05-17 DIAGNOSIS — I2510 Atherosclerotic heart disease of native coronary artery without angina pectoris: Secondary | ICD-10-CM | POA: Insufficient documentation

## 2017-05-17 DIAGNOSIS — D120 Benign neoplasm of cecum: Secondary | ICD-10-CM | POA: Insufficient documentation

## 2017-05-17 DIAGNOSIS — I739 Peripheral vascular disease, unspecified: Secondary | ICD-10-CM | POA: Insufficient documentation

## 2017-05-17 DIAGNOSIS — E7800 Pure hypercholesterolemia, unspecified: Secondary | ICD-10-CM | POA: Diagnosis not present

## 2017-05-17 DIAGNOSIS — Z79899 Other long term (current) drug therapy: Secondary | ICD-10-CM | POA: Insufficient documentation

## 2017-05-17 DIAGNOSIS — Z7982 Long term (current) use of aspirin: Secondary | ICD-10-CM | POA: Diagnosis not present

## 2017-05-17 DIAGNOSIS — K2270 Barrett's esophagus without dysplasia: Secondary | ICD-10-CM | POA: Diagnosis not present

## 2017-05-17 DIAGNOSIS — K635 Polyp of colon: Secondary | ICD-10-CM | POA: Insufficient documentation

## 2017-05-17 DIAGNOSIS — Z1211 Encounter for screening for malignant neoplasm of colon: Secondary | ICD-10-CM | POA: Diagnosis not present

## 2017-05-17 DIAGNOSIS — D126 Benign neoplasm of colon, unspecified: Secondary | ICD-10-CM | POA: Diagnosis not present

## 2017-05-17 DIAGNOSIS — D122 Benign neoplasm of ascending colon: Secondary | ICD-10-CM | POA: Insufficient documentation

## 2017-05-17 DIAGNOSIS — Z853 Personal history of malignant neoplasm of breast: Secondary | ICD-10-CM | POA: Insufficient documentation

## 2017-05-17 DIAGNOSIS — Z8371 Family history of colonic polyps: Secondary | ICD-10-CM | POA: Diagnosis not present

## 2017-05-17 DIAGNOSIS — K210 Gastro-esophageal reflux disease with esophagitis: Secondary | ICD-10-CM | POA: Diagnosis not present

## 2017-05-17 DIAGNOSIS — G4730 Sleep apnea, unspecified: Secondary | ICD-10-CM | POA: Diagnosis not present

## 2017-05-17 DIAGNOSIS — Z85828 Personal history of other malignant neoplasm of skin: Secondary | ICD-10-CM | POA: Diagnosis not present

## 2017-05-17 DIAGNOSIS — K5790 Diverticulosis of intestine, part unspecified, without perforation or abscess without bleeding: Secondary | ICD-10-CM | POA: Diagnosis not present

## 2017-05-17 DIAGNOSIS — K2970 Gastritis, unspecified, without bleeding: Secondary | ICD-10-CM | POA: Insufficient documentation

## 2017-05-17 DIAGNOSIS — K3189 Other diseases of stomach and duodenum: Secondary | ICD-10-CM | POA: Diagnosis not present

## 2017-05-17 DIAGNOSIS — R1012 Left upper quadrant pain: Secondary | ICD-10-CM | POA: Diagnosis not present

## 2017-05-17 HISTORY — DX: Cardiomegaly: I51.7

## 2017-05-17 HISTORY — DX: Dyspnea, unspecified: R06.00

## 2017-05-17 HISTORY — DX: Palpitations: R00.2

## 2017-05-17 HISTORY — PX: COLONOSCOPY WITH PROPOFOL: SHX5780

## 2017-05-17 HISTORY — DX: Atrial premature depolarization: I49.1

## 2017-05-17 HISTORY — PX: ESOPHAGOGASTRODUODENOSCOPY (EGD) WITH PROPOFOL: SHX5813

## 2017-05-17 LAB — HM COLONOSCOPY

## 2017-05-17 SURGERY — ESOPHAGOGASTRODUODENOSCOPY (EGD) WITH PROPOFOL
Anesthesia: General

## 2017-05-17 MED ORDER — MIDAZOLAM HCL 5 MG/5ML IJ SOLN
INTRAMUSCULAR | Status: DC | PRN
  Administered 2017-05-17: 2 mg via INTRAVENOUS

## 2017-05-17 MED ORDER — SODIUM CHLORIDE 0.9 % IV SOLN: INTRAVENOUS | Status: DC

## 2017-05-17 MED ORDER — SODIUM CHLORIDE 0.9 % IV SOLN
INTRAVENOUS | Status: DC
  Administered 2017-05-17: 1000 mL via INTRAVENOUS

## 2017-05-17 MED ORDER — EPHEDRINE SULFATE 50 MG/ML IJ SOLN
INTRAMUSCULAR | Status: DC | PRN
  Administered 2017-05-17 (×2): 5 mg via INTRAVENOUS

## 2017-05-17 MED ORDER — LIDOCAINE HCL (PF) 2 % IJ SOLN
INTRAMUSCULAR | Status: AC
  Filled 2017-05-17: qty 10

## 2017-05-17 MED ORDER — PROPOFOL 500 MG/50ML IV EMUL
INTRAVENOUS | Status: DC | PRN
  Administered 2017-05-17: 50 ug/kg/min via INTRAVENOUS

## 2017-05-17 MED ORDER — FENTANYL CITRATE (PF) 100 MCG/2ML IJ SOLN
INTRAMUSCULAR | Status: DC | PRN
  Administered 2017-05-17 (×2): 25 ug via INTRAVENOUS
  Administered 2017-05-17: 50 ug via INTRAVENOUS

## 2017-05-17 MED ORDER — LIDOCAINE HCL (PF) 2 % IJ SOLN
INTRAMUSCULAR | Status: DC | PRN
  Administered 2017-05-17: 80 mg

## 2017-05-17 MED ORDER — MIDAZOLAM HCL 2 MG/2ML IJ SOLN
INTRAMUSCULAR | Status: AC
  Filled 2017-05-17: qty 2

## 2017-05-17 MED ORDER — FENTANYL CITRATE (PF) 100 MCG/2ML IJ SOLN
INTRAMUSCULAR | Status: AC
  Filled 2017-05-17: qty 2

## 2017-05-17 MED ORDER — PROPOFOL 10 MG/ML IV BOLUS
INTRAVENOUS | Status: DC | PRN
  Administered 2017-05-17 (×2): 20 mg via INTRAVENOUS

## 2017-05-17 NOTE — H&P (Signed)
Primary Care Physician:  Einar Pheasant, MD Primary Gastroenterologist:  Dr. Vira Agar  Pre-Procedure History & Physical: HPI:  Lindsey Bentley is a 72 y.o. female is here for an endoscopy and colonoscopy.   Past Medical History:  Diagnosis Date  . Arthritis   . Breast cancer ([place]) 2016   LT LUMPECTOMY - TI, NO, MO - IDC, ER/PR pos, Her 2 neg  . Carotid artery occlusion   . Carotid artery occlusion   . 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 Coronary artery disease   . Depression   . Diffuse cystic mastopathy 2013  . DVT (deep venous thrombosis) ([place])   . Dyspnea   . Elevated TSH   . Family history of adverse reaction to anesthesia    Pt stated that son had a seizure with a combination of anesthesia and pain medication."  . GERD (gastroesophageal reflux disease)   . Glaucoma   . High cholesterol   . History of chicken pox   . Hyperglycemia   . Hyperlipidemia   . Hypertension   . LVH (left ventricular hypertrophy)   . PAC (premature atrial contraction)   . Palpitations   . Peripheral vascular disease ([place])   . Personal history of radiation therapy 2016   BREAST CA  . Pneumonia March 2014  . Skin cancer 2013  . Sleep apnea    wears CPAP set at 2.5  . Wears glasses     Past Surgical History:  Procedure Laterality Date  . BACK SURGERY  1986   ruptured disc  . BREAST BIOPSY Left 2008   NEG  . BREAST BIOPSY Left 08-20-14   POS  . BREAST BIOPSY Right 2007   NEG  . BREAST EXCISIONAL BIOPSY Left 1984   NEG  . 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 BREAST SURGERY Left 09-05-14   left breast lumpectomy with SLN biopsy, ER/PR positive. HER2 negative.  [name] CARPAL TUNNEL RELEASE    . CHOLECYSTECTOMY  2006  . COLONOSCOPY  2010   Dr. Jamal Collin  . DILATION AND CURETTAGE OF UTERUS    . LAPAROTOMY FOR REMOVAL TUMOR LUMBAR PLEXES    . Leg stent  2011  . MOLE REMOVAL  2013   15 removed  . POSTERIOR CERVICAL LAMINECTOMY Left 10/15/2015   Procedure:  Left Cervical four- five Hemilaminectomy/Remove mass;  Surgeon: Leeroy Cha, MD;  Location: [facility];  Service: Neurosurgery;  Laterality: Left;  Left C4-5 Hemilaminectomy/Remove mass    Prior to Admission medications   Medication Sig Start Date End Date Taking? Authorizing Provider  aspirin (BAYER ASPIRIN) 325 MG tablet Take 325 mg by mouth daily.   Yes [provider]  carvedilol (COREG) 12.5 MG tablet Take by mouth. 06/04/16 06/04/17 Yes [provider]  Cholecalciferol (VITAMIN D-1000 MAX ST) 1000 units tablet Take 1,000 Units by mouth daily.    Yes [provider]  letrozole (FEMARA) 2.5 MG tablet TAKE 1 TABLET BY MOUTH EVERY DAY 03/19/17  Yes Sankar, Seeplaputhur G, MD  losartan-hydrochlorothiazide (HYZAAR) 100-25 MG tablet Take 1 tablet by mouth daily. 12/17/16  Yes Einar Pheasant, MD  meclizine (ANTIVERT) 25 MG tablet Take 1 tablet (25 mg total) by mouth 3 (three) times daily as needed for dizziness or nausea. 09/23/16  Yes Paulette Blanch, MD  rosuvastatin (CRESTOR) 10 MG tablet TAKE 1 TABLET BY MOUTH EVERY DAY 11/20/16  Yes Einar Pheasant, MD  vitamin E 400 UNIT capsule Take 400 Units  by mouth daily.   Yes [provider]    Allergies as of 03/29/2017 - Review Complete 03/09/2017  Allergen Reaction Noted  . Codeine Nausea And Vomiting 02/24/2012  . Adhesive [tape] Itching and Rash 10/09/2015  . Prednisone Swelling 09/18/2014  . Statins Other (See Comments) 07/19/2015    Family History  Problem Relation Age of Onset  . Cancer Father        Prostate  . Diabetes Father   . Cerebrovascular Accident Father   . Hypertension Mother   . AAA (abdominal aortic aneurysm) Mother   . Hyperlipidemia Other        Parent  . Miscarriages / Stillbirths Other        Parent  . Hypertension Other        parent  . Heart disease Other        Parent  . Breast cancer Maternal Aunt 72    Social History   Socioeconomic History  . Marital status: Married     Spouse name: Not on file  . Number of children: 3  . Years of education: 30  . Highest education level: Not on file  Social Needs  . Financial resource strain: Not on file  . Food insecurity - worry: Not on file  . Food insecurity - inability: Not on file  . Transportation needs - medical: Not on file  . Transportation needs - non-medical: Not on file  Occupational History  . Occupation: Caregiver   Tobacco Use  . Smoking status: Never Smoker  . Smokeless tobacco: Never Used  Substance and Sexual Activity  . Alcohol use: No    Alcohol/week: 0.0 oz  . Drug use: No  . Sexual activity: No  Other Topics Concern  . Not on file  Social History Narrative   Regular exercise-mo   Caffeine Use-no    Review of Systems: See HPI, otherwise negative ROS  Physical Exam: BP (!) 161/69   Pulse 63   Temp 97.9 F (36.6 C) (Tympanic)   Resp 16   Ht '5\' 5"'$  (1.651 m)   Wt 88.5 kg (195 lb)   SpO2 99%   BMI 32.45 kg/m  General:   Alert,  pleasant and cooperative in NAD Head:  Normocephalic and atraumatic. Neck:  Supple; no masses or thyromegaly. Lungs:  Clear throughout to auscultation.    Heart:  Regular rate and rhythm. Abdomen:  Soft, nontender and nondistended. Normal bowel sounds, without guarding, and without rebound.   Neurologic:  Alert and  oriented x4;  grossly normal neurologically.  Impression/Plan: [name] is here for an endoscopy and colonoscopy to be performed for Left upper abdominal pain and FH colon polyps.  Risks, benefits, limitations, and alternatives regarding  endoscopy and colonoscopy have been reviewed with the patient.  Questions have been answered.  All parties agreeable.   Gaylyn Cheers, MD  05/17/2017, 11:03 AM

## 2017-05-17 NOTE — Anesthesia Post-op Follow-up Note (Signed)
Anesthesia QCDR form completed.        

## 2017-05-17 NOTE — Op Note (Signed)
[facility] Gastroenterology Patient Name: Lindsey Bentley Procedure Date: 05/17/2017 11:07 AM MRN: [id number] Account #: [id number] Date of Birth: 03-Oct-1945 Admit Type: Outpatient Age: 72 Room: [facility] ENDO ROOM 3 Gender: Female Note Status: Finalized Procedure:            Upper GI endoscopy Indications:          Epigastric abdominal pain, Abdominal pain in the left                        upper quadrant Providers:            Manya Silvas, MD Referring MD:         Einar Pheasant, MD (Referring MD) Medicines:            Propofol per Anesthesia Complications:        No immediate complications. Procedure:            Pre-Anesthesia Assessment:                       - After reviewing the risks and benefits, the patient                        was deemed in satisfactory condition to undergo the                        procedure.                       After obtaining informed consent, the endoscope was                        passed under direct vision. Throughout the procedure,                        the patient's blood pressure, pulse, and oxygen                        saturations were monitored continuously. The Endoscope                        was introduced through the mouth, and advanced to the                        second part of duodenum. The upper GI endoscopy was                        accomplished without difficulty. The patient tolerated                        the procedure well. Findings:      Localized mild mucosal changes characterized by erythema and granularity       were found at the gastroesophageal junction. This was 43-44cm from       teeth. Biopsies were taken with a cold forceps for histology.      Scattered and patchy minimal inflammation characterized by erythema and       granularity was found in the gastric body and in the gastric antrum.       Biopsies were taken with a cold forceps for histology. Biopsies were       taken with a cold forceps  for histology.  The examined duodenum was normal. Impression:           - Erythematous, granular mucosa in the esophagus.                        Biopsied.                       - Gastritis. Biopsied.                       - Normal examined duodenum. Recommendation:       - Await pathology results. Manya Silvas, MD 05/17/2017 11:30:01 AM This report has been signed electronically. Number of Addenda: 0 Note Initiated On: 05/17/2017 11:07 AM      [facility]

## 2017-05-17 NOTE — Op Note (Signed)
[facility] Gastroenterology Patient Name: Lindsey Bentley Procedure Date: 05/17/2017 11:06 AM MRN: [id number] Account #: [id number] Date of Birth: August 24, 1945 Admit Type: Outpatient Age: 72 Room: [facility] ENDO ROOM 3 Gender: Female Note Status: Finalized Procedure:            Colonoscopy Indications:          Colon cancer screening in patient at increased risk:                        Family history of 1st-degree relative with colon polyps Providers:            Manya Silvas, MD Referring MD:         Einar Pheasant, MD (Referring MD) Medicines:            Propofol per Anesthesia Complications:        No immediate complications. Procedure:            Pre-Anesthesia Assessment:                       - After reviewing the risks and benefits, the patient                        was deemed in satisfactory condition to undergo the                        procedure.                       After obtaining informed consent, the colonoscope was                        passed under direct vision. Throughout the procedure,                        the patient's blood pressure, pulse, and oxygen                        saturations were monitored continuously. The                        Colonoscope was introduced through the anus and                        advanced to the the cecum, identified by appendiceal                        orifice and ileocecal valve. The colonoscopy was                        performed without difficulty. The patient tolerated the                        procedure well. The quality of the bowel preparation                        was good. Findings:      A small polyp was found in the cecum. The polyp was sessile. The polyp       was removed with a hot snare. Resection and retrieval were complete.      Two sessile polyps were found in the  ascending colon. The polyps were       diminutive in size. These polyps were removed with a jumbo cold forceps.       Resection  and retrieval were complete.      A diminutive polyp was found in the sigmoid colon. The polyp was       sessile. The polyp was removed with a jumbo cold forceps. Resection and       retrieval were complete.      A diminutive polyp was found in the sigmoid colon. The polyp was       sessile. The polyp was removed with a hot snare. Resection and retrieval       were complete.      A diminutive polyp was found in the rectum. The polyp was sessile. The       polyp was removed with a jumbo cold forceps. Resection and retrieval       were complete. Impression:           - One small polyp in the cecum, removed with a hot                        snare. Resected and retrieved.                       - Two diminutive polyps in the ascending colon, removed                        with a jumbo cold forceps. Resected and retrieved.                       - One diminutive polyp in the sigmoid colon, removed                        with a jumbo cold forceps. Resected and retrieved.                       - One diminutive polyp in the sigmoid colon, removed                        with a hot snare. Resected and retrieved.                       - One diminutive polyp in the rectum, removed with a                        jumbo cold forceps. Resected and retrieved. Recommendation:       - Await pathology results. Manya Silvas, MD 05/17/2017 12:02:25 PM This report has been signed electronically. Number of Addenda: 0 Note Initiated On: 05/17/2017 11:06 AM Scope Withdrawal Time: 0 hours 20 minutes 41 seconds  Total Procedure Duration: 0 hours 26 minutes 50 seconds       [facility]

## 2017-05-17 NOTE — Anesthesia Preprocedure Evaluation (Signed)
Anesthesia Evaluation  Patient identified by MRN, date of birth, ID band Patient awake    Reviewed: Allergy & Precautions, H&P , NPO status , Patient's Chart, lab work & pertinent test results, reviewed documented beta blocker date and time   History of Anesthesia Complications (+) PONV, Family history of anesthesia reaction and history of anesthetic complications  Airway Mallampati: II  TM Distance: >3 FB Neck ROM: full    Dental  (+) Caps, Dental Advidsory Given, Teeth Intact   Pulmonary shortness of breath and with exertion, sleep apnea and Continuous Positive Airway Pressure Ventilation , neg COPD, neg recent URI,           Cardiovascular Exercise Tolerance: Good hypertension, (-) angina+ CAD and + Peripheral Vascular Disease  (-) Past MI, (-) Cardiac Stents and (-) CABG + dysrhythmias (-) Valvular Problems/Murmurs     Neuro/Psych neg Seizures PSYCHIATRIC DISORDERS  Neuromuscular disease    GI/Hepatic Neg liver ROS, GERD  ,  Endo/Other  diabetes, Well Controlled  Renal/GU negative Renal ROS  negative genitourinary   Musculoskeletal   Abdominal   Peds  Hematology negative hematology ROS (+)   Anesthesia Other Findings Past Medical History: No date: Arthritis 2016: Breast cancer ([place])     Comment:  LT LUMPECTOMY - TI, NO, MO - IDC, ER/PR pos, Her 2 neg No date: Carotid artery occlusion No date: Carotid artery occlusion No date: 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 No date: Coronary artery disease No date: Depression 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 No date: DVT (deep venous thrombosis) (HCC) No date: Dyspnea No date: Elevated TSH No date: Family history of adverse reaction to anesthesia     Comment:  Pt stated that son had a seizure with a combination of               anesthesia and pain medication." No date: GERD (gastroesophageal reflux disease) No date: Glaucoma No  date: High cholesterol No date: History of chicken pox No date: Hyperglycemia No date: Hyperlipidemia No date: Hypertension No date: LVH (left ventricular hypertrophy) No date: PAC (premature atrial contraction) No date: Palpitations No date: Peripheral vascular disease ([place]) 2016: Personal history of radiation therapy     Comment:  BREAST CA March 2014: Pneumonia 2013: Skin cancer No date: Sleep apnea     Comment:  wears CPAP set at 2.5 No date: Wears glasses   Reproductive/Obstetrics negative OB ROS                             Anesthesia Physical Anesthesia Plan  ASA: III  Anesthesia Plan: General   Post-op Pain Management:    Induction: Intravenous  PONV Risk Score and Plan: 4 or greater and Propofol infusion  Airway Management Planned: Nasal Cannula  Additional Equipment:   Intra-op Plan:   Post-operative Plan:   Informed Consent: I have reviewed the patients History and Physical, chart, labs and discussed the procedure including the risks, benefits and alternatives for the proposed anesthesia with the patient or authorized representative who has indicated his/her understanding and acceptance.   Dental Advisory Given  Plan Discussed with: Anesthesiologist, CRNA and Surgeon  Anesthesia Plan Comments:         Anesthesia Quick Evaluation

## 2017-05-17 NOTE — Anesthesia Postprocedure Evaluation (Signed)
Anesthesia Post Note  Patient: DEEPTI GUNAWAN  Procedure(s) Performed: ESOPHAGOGASTRODUODENOSCOPY (EGD) WITH PROPOFOL (N/A ) COLONOSCOPY WITH PROPOFOL (N/A )  Patient location during evaluation: Endoscopy Anesthesia Type: General Level of consciousness: awake and alert Pain management: pain level controlled Vital Signs Assessment: post-procedure vital signs reviewed and stable Respiratory status: spontaneous breathing, nonlabored ventilation, respiratory function stable and patient connected to nasal cannula oxygen Cardiovascular status: blood pressure returned to baseline and stable Postop Assessment: no apparent nausea or vomiting Anesthetic complications: no     Last Vitals:  Vitals:   05/17/17 1201 05/17/17 1231  BP: (!) 117/95 (!) 164/68  Pulse: 67   Resp: (!) 22   Temp: (!) 36.2 C   SpO2: 99%     Last Pain:  Vitals:   05/17/17 1201  TempSrc: Tympanic                 Martha Clan

## 2017-05-17 NOTE — Telephone Encounter (Signed)
Copied from [place] [id number]. Topic: Inquiry >> May 17, 2017  5:06 PM [name] wrote: Reason for CRM: pt had a colonoscopy and upper GI and wanted to inform her pcp of what happened, and talk about some meds that the other physicians are wanting to prescribe, contact pt to advise

## 2017-05-17 NOTE — Transfer of Care (Signed)
Immediate Anesthesia Transfer of Care Note  Patient: Lindsey Bentley  Procedure(s) Performed: ESOPHAGOGASTRODUODENOSCOPY (EGD) WITH PROPOFOL (N/A ) COLONOSCOPY WITH PROPOFOL (N/A )  Patient Location: PACU  Anesthesia Type:General  Level of Consciousness: sedated  Airway & Oxygen Therapy: Patient Spontanous Breathing and Patient connected to nasal cannula oxygen  Post-op Assessment: Report given to RN and Post -op Vital signs reviewed and stable  Post vital signs: Reviewed and stable  Last Vitals:  Vitals:   05/17/17 1006  BP: (!) 161/69  Pulse: 63  Resp: 16  Temp: 36.6 C  SpO2: 99%    Last Pain:  Vitals:   05/17/17 1006  TempSrc: Tympanic         Complications: No apparent anesthesia complications

## 2017-05-18 ENCOUNTER — Encounter: Payer: Self-pay | Admitting: Unknown Physician Specialty

## 2017-05-18 LAB — SURGICAL PATHOLOGY

## 2017-05-18 NOTE — Telephone Encounter (Signed)
Please advise 

## 2017-05-18 NOTE — Telephone Encounter (Signed)
Colonoscopy found 6 polyps.  EGD found some inflammation in stomach and biopsied. Scattered inflammation categorized by erythema and granularity gastric body and atrium, also biopsied.    Patient was given Omeprazole for sx.  Patient will also bring in the results.

## 2017-05-18 NOTE — Telephone Encounter (Signed)
Noted.  Was this just FYI.  If so, then ok - can close.  Just let me know if I need to do anything or if she needs anything.

## 2017-05-19 ENCOUNTER — Other Ambulatory Visit: Payer: Self-pay | Admitting: Internal Medicine

## 2017-05-20 ENCOUNTER — Ambulatory Visit: Payer: Medicare Other | Admitting: Radiation Oncology

## 2017-05-21 DIAGNOSIS — R0600 Dyspnea, unspecified: Secondary | ICD-10-CM | POA: Diagnosis not present

## 2017-05-21 DIAGNOSIS — R0609 Other forms of dyspnea: Secondary | ICD-10-CM | POA: Diagnosis not present

## 2017-05-21 DIAGNOSIS — G4733 Obstructive sleep apnea (adult) (pediatric): Secondary | ICD-10-CM | POA: Diagnosis not present

## 2017-05-21 DIAGNOSIS — J849 Interstitial pulmonary disease, unspecified: Secondary | ICD-10-CM | POA: Diagnosis not present

## 2017-05-24 ENCOUNTER — Other Ambulatory Visit: Payer: Self-pay | Admitting: Specialist

## 2017-05-24 DIAGNOSIS — R0600 Dyspnea, unspecified: Secondary | ICD-10-CM

## 2017-05-24 DIAGNOSIS — R0609 Other forms of dyspnea: Principal | ICD-10-CM

## 2017-06-03 ENCOUNTER — Ambulatory Visit
Admission: RE | Admit: 2017-06-03 | Discharge: 2017-06-03 | Disposition: A | Discharge status: Home / Self Care | Payer: Medicare Other | Source: Ambulatory Visit | Attending: Specialist | Admitting: Specialist

## 2017-06-03 DIAGNOSIS — K760 Fatty (change of) liver, not elsewhere classified: Secondary | ICD-10-CM | POA: Diagnosis not present

## 2017-06-03 DIAGNOSIS — R918 Other nonspecific abnormal finding of lung field: Secondary | ICD-10-CM | POA: Insufficient documentation

## 2017-06-03 DIAGNOSIS — I700 Atherosclerosis of aorta: Secondary | ICD-10-CM | POA: Diagnosis not present

## 2017-06-03 DIAGNOSIS — I2510 Atherosclerotic heart disease of native coronary artery without angina pectoris: Secondary | ICD-10-CM | POA: Diagnosis not present

## 2017-06-03 DIAGNOSIS — R0609 Other forms of dyspnea: Secondary | ICD-10-CM | POA: Diagnosis not present

## 2017-06-03 DIAGNOSIS — R0600 Dyspnea, unspecified: Secondary | ICD-10-CM

## 2017-06-09 ENCOUNTER — Telehealth: Payer: Self-pay

## 2017-06-09 NOTE — Telephone Encounter (Signed)
Called patient to let her know that she could take the omeprazole. She says she has been taking the medication for about 3 weeks now and has not noticed any change. Told patient I would send message to you and see if you would like to make any changes. She is currently taking 40 mg daily.

## 2017-06-10 NOTE — Telephone Encounter (Signed)
Need to know what specific symptoms she is having?

## 2017-06-14 ENCOUNTER — Ambulatory Visit: Payer: Medicare Other | Admitting: Radiation Oncology

## 2017-06-15 ENCOUNTER — Other Ambulatory Visit: Payer: Self-pay | Admitting: Specialist

## 2017-06-15 DIAGNOSIS — J8489 Other specified interstitial pulmonary diseases: Secondary | ICD-10-CM

## 2017-06-15 NOTE — Telephone Encounter (Signed)
Stomach under breast aching. Pain is getting better. Every once in a while she will notice the pain but she feels like the medication has started working and she is getting better. Patient also says she has had some dizzy spells over the last couple weeks. She has been taking meclizine as needed and says it is effective. No other acute symptoms noted.

## 2017-06-15 NOTE — Telephone Encounter (Signed)
If having persistent issues, I can see her in the office.  I can see her Thursday at 2:00 - open spot.

## 2017-06-15 NOTE — Telephone Encounter (Signed)
Patient has declined appt and stated she would call us if symptoms become persistent or worsen.

## 2017-06-17 DIAGNOSIS — G4733 Obstructive sleep apnea (adult) (pediatric): Secondary | ICD-10-CM | POA: Diagnosis not present

## 2017-06-19 ENCOUNTER — Other Ambulatory Visit: Payer: Self-pay | Admitting: Internal Medicine

## 2017-06-30 ENCOUNTER — Ambulatory Visit
Admission: RE | Admit: 2017-06-30 | Discharge: 2017-06-30 | Disposition: A | Discharge status: Home / Self Care | Payer: Medicare Other | Source: Ambulatory Visit | Attending: Radiation Oncology | Admitting: Radiation Oncology

## 2017-06-30 ENCOUNTER — Encounter: Payer: Self-pay | Admitting: Radiation Oncology

## 2017-06-30 ENCOUNTER — Other Ambulatory Visit: Payer: Self-pay

## 2017-06-30 VITALS — Temp 96.4°F | Wt 202.7 lb

## 2017-06-30 DIAGNOSIS — C50512 Malignant neoplasm of lower-outer quadrant of left female breast: Secondary | ICD-10-CM | POA: Diagnosis not present

## 2017-06-30 DIAGNOSIS — Z79811 Long term (current) use of aromatase inhibitors: Secondary | ICD-10-CM | POA: Insufficient documentation

## 2017-06-30 DIAGNOSIS — Z923 Personal history of irradiation: Secondary | ICD-10-CM | POA: Diagnosis not present

## 2017-06-30 DIAGNOSIS — Z170 Estrogen receptor positive status [ER+]: Secondary | ICD-10-CM | POA: Diagnosis not present

## 2017-06-30 NOTE — Progress Notes (Signed)
Radiation Oncology Follow up Note  Name: Lindsey Bentley   Date:   06/30/2017 MRN:  [id number] DOB: [birth year]    This 72 y.o. female presents to the clinic today for 2.5 year follow-up status post accelerated partial breast radiation to her left breast for stage I ER/PR positive invasive mammary carcinoma.  REFERRING PROVIDER: Einar Pheasant, MD  HPI: Patient is a 72 year old female now seen out 2.5 years having completed accelerated partial breast irradiation to her left breast for stage I a ER/PR positive invasive mammary carcinoma. Seen today in routine follow-up she is doing well. She specifically denies breast tenderness cough or bone pain. She's currently on letrozole tolerating that well without side effect.. She had a mammogram back in October 2018 showing benign findings with some dystrophic calcifications at the lumpectomy site in the left breast.  COMPLICATIONS OF TREATMENT: none  FOLLOW UP COMPLIANCE: keeps appointments   PHYSICAL EXAM:  Temp (!) 96.4 F (35.8 C)   Wt 202 lb 11.4 oz (92 kg)   BMI 33.73 kg/m  Left breast and lumpectomy site is thickened consistent with prior radiation and surgery. No other dominant mass or nodularity is noted in either breast in 2 positions examined. No axillary or supraclavicular adenopathy is appreciated. Well-developed well-nourished patient in NAD. HEENT reveals PERLA, EOMI, discs not visualized.  Oral cavity is clear. No oral mucosal lesions are identified. Neck is clear without evidence of cervical or supraclavicular adenopathy. Lungs are clear to A&P. Cardiac examination is essentially unremarkable with regular rate and rhythm without murmur rub or thrill. Abdomen is benign with no organomegaly or masses noted. Motor sensory and DTR levels are equal and symmetric in the upper and lower extremities. Cranial nerves II through XII are grossly intact. Proprioception is intact. No peripheral adenopathy or edema is identified. No motor or sensory  levels are noted. Crude visual fields are within normal range. RADIOLOGY RESULTS: Mammograms from October are reviewed and compatible with the above-stated findings  PLAN: Patient continues to do well with no evidence of disease. I'm please were overall progress. I've asked to see her back in 1 year for follow-up. She continues on letrozole without side effect. She is a rescheduled for her follow-up mammograms. Patient knows to call with any concerns.  I would like to take this opportunity to thank you for allowing me to participate in the care of your patient.Noreene Filbert, MD

## 2017-07-12 ENCOUNTER — Other Ambulatory Visit: Payer: Self-pay

## 2017-07-12 DIAGNOSIS — C50512 Malignant neoplasm of lower-outer quadrant of left female breast: Secondary | ICD-10-CM

## 2017-07-12 DIAGNOSIS — Z170 Estrogen receptor positive status [ER+]: Principal | ICD-10-CM

## 2017-07-15 ENCOUNTER — Other Ambulatory Visit: Payer: Self-pay | Admitting: Internal Medicine

## 2017-07-20 DIAGNOSIS — G4733 Obstructive sleep apnea (adult) (pediatric): Secondary | ICD-10-CM | POA: Diagnosis not present

## 2017-08-18 ENCOUNTER — Ambulatory Visit
Admission: RE | Admit: 2017-08-18 | Discharge: 2017-08-18 | Disposition: A | Discharge status: Home / Self Care | Payer: Medicare Other | Source: Ambulatory Visit | Attending: General Surgery | Admitting: General Surgery

## 2017-08-18 ENCOUNTER — Encounter: Payer: Self-pay | Admitting: Internal Medicine

## 2017-08-18 DIAGNOSIS — Z170 Estrogen receptor positive status [ER+]: Principal | ICD-10-CM

## 2017-08-18 DIAGNOSIS — C50512 Malignant neoplasm of lower-outer quadrant of left female breast: Secondary | ICD-10-CM | POA: Diagnosis not present

## 2017-08-18 DIAGNOSIS — R928 Other abnormal and inconclusive findings on diagnostic imaging of breast: Secondary | ICD-10-CM | POA: Diagnosis not present

## 2017-08-24 ENCOUNTER — Other Ambulatory Visit (INDEPENDENT_AMBULATORY_CARE_PROVIDER_SITE_OTHER): Payer: Medicare Other

## 2017-08-24 DIAGNOSIS — I10 Essential (primary) hypertension: Secondary | ICD-10-CM | POA: Diagnosis not present

## 2017-08-24 DIAGNOSIS — E1159 Type 2 diabetes mellitus with other circulatory complications: Secondary | ICD-10-CM | POA: Diagnosis not present

## 2017-08-24 DIAGNOSIS — E7800 Pure hypercholesterolemia, unspecified: Secondary | ICD-10-CM | POA: Diagnosis not present

## 2017-08-24 LAB — BASIC METABOLIC PANEL
BUN: 22 mg/dL (ref 6–23)
CHLORIDE: 106 meq/L (ref 96–112)
CO2: 22 mEq/L (ref 19–32)
Calcium: 9.7 mg/dL (ref 8.4–10.5)
Creatinine, Ser: 0.84 mg/dL (ref 0.40–1.20)
GFR: 70.63 mL/min (ref >60.00)
Glucose, Bld: 133 mg/dL — ABNORMAL HIGH (ref 70–99)
POTASSIUM: 4.2 meq/L (ref 3.5–5.1)
Sodium: 137 mEq/L (ref 135–145)

## 2017-08-24 LAB — HEPATIC FUNCTION PANEL
ALK PHOS: 63 U/L (ref 39–117)
ALT: 30 U/L (ref 0–35)
AST: 19 U/L (ref 0–37)
Albumin: 4.2 g/dL (ref 3.5–5.2)
BILIRUBIN DIRECT: 0.1 mg/dL (ref 0.0–0.3)
Total Bilirubin: 0.6 mg/dL (ref 0.2–1.2)
Total Protein: 7.2 g/dL (ref 6.0–8.3)

## 2017-08-24 LAB — LDL CHOLESTEROL, DIRECT: LDL DIRECT: 117 mg/dL

## 2017-08-24 LAB — LIPID PANEL
Cholesterol: 175 mg/dL (ref 0–200)
HDL: 39.1 mg/dL (ref >39.00)
NONHDL: 135.44
Total CHOL/HDL Ratio: 4
Triglycerides: 223 mg/dL — ABNORMAL HIGH (ref 0.0–149.0)
VLDL: 44.6 mg/dL — AB (ref 0.0–40.0)

## 2017-08-24 LAB — TSH: TSH: 5.07 u[IU]/mL — AB (ref 0.35–4.50)

## 2017-08-24 LAB — HEMOGLOBIN A1C: HEMOGLOBIN A1C: 6.8 % — AB (ref 4.6–6.5)

## 2017-08-25 ENCOUNTER — Ambulatory Visit (INDEPENDENT_AMBULATORY_CARE_PROVIDER_SITE_OTHER): Payer: Medicare Other | Admitting: Internal Medicine

## 2017-08-25 ENCOUNTER — Encounter: Payer: Self-pay | Admitting: Internal Medicine

## 2017-08-25 VITALS — BP 148/80 | HR 64 | Temp 98.3°F | Resp 18 | Wt 206.0 lb

## 2017-08-25 DIAGNOSIS — J449 Chronic obstructive pulmonary disease, unspecified: Secondary | ICD-10-CM | POA: Diagnosis not present

## 2017-08-25 DIAGNOSIS — I6523 Occlusion and stenosis of bilateral carotid arteries: Secondary | ICD-10-CM | POA: Diagnosis not present

## 2017-08-25 DIAGNOSIS — K219 Gastro-esophageal reflux disease without esophagitis: Secondary | ICD-10-CM | POA: Diagnosis not present

## 2017-08-25 DIAGNOSIS — E7800 Pure hypercholesterolemia, unspecified: Secondary | ICD-10-CM | POA: Diagnosis not present

## 2017-08-25 DIAGNOSIS — C50912 Malignant neoplasm of unspecified site of left female breast: Secondary | ICD-10-CM | POA: Diagnosis not present

## 2017-08-25 DIAGNOSIS — R7989 Other specified abnormal findings of blood chemistry: Secondary | ICD-10-CM | POA: Diagnosis not present

## 2017-08-25 DIAGNOSIS — F439 Reaction to severe stress, unspecified: Secondary | ICD-10-CM | POA: Diagnosis not present

## 2017-08-25 DIAGNOSIS — I25119 Atherosclerotic heart disease of native coronary artery with unspecified angina pectoris: Secondary | ICD-10-CM | POA: Diagnosis not present

## 2017-08-25 DIAGNOSIS — I739 Peripheral vascular disease, unspecified: Secondary | ICD-10-CM | POA: Diagnosis not present

## 2017-08-25 DIAGNOSIS — E041 Nontoxic single thyroid nodule: Secondary | ICD-10-CM | POA: Diagnosis not present

## 2017-08-25 DIAGNOSIS — I10 Essential (primary) hypertension: Secondary | ICD-10-CM

## 2017-08-25 DIAGNOSIS — R945 Abnormal results of liver function studies: Secondary | ICD-10-CM

## 2017-08-25 NOTE — Progress Notes (Signed)
[name], female   DOB: 01-14-1946, 72 y.o.   MRN: [id number]   Subjective:    [name], female    DOB: 05-30-1945, 72 y.o.   MRN: [id number]  HPI  Patient here for a scheduled follow up.  She reports she is doing better.  Feels better.  Breathing stable.  Saw Dr Raul Del.  States was diagnosed with first stage COPD.  No chest pain.  States blood pressures averaging 202R systolic.  She has not been watching her diet as well. Has been eating more food with increased sodium.  Plans to adjust her diet and start exercising more.  No acid reflux.  No abdominal pan.  Bowels moving.  Discussed recent lab results.  She desires not to increase crestor.  She is tolerating.  Saw Dr Donella Stade recently.  Recommended f/u in one year.  Due to see Dr Bary Castilla tomorrow.     Past Medical History:  Diagnosis Date  . Arthritis   . Breast cancer ([place]) 2016   LT LUMPECTOMY - TI, NO, MO - IDC, ER/PR pos, Her 2 neg  . Carotid artery occlusion   . Carotid artery occlusion   . 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 Coronary artery disease   . Depression   . Diffuse cystic mastopathy 2013  . DVT (deep venous thrombosis) ([place])   . Dyspnea   . Elevated TSH   . Family history of adverse reaction to anesthesia    Pt stated that son had a seizure with a combination of anesthesia and pain medication."  . GERD (gastroesophageal reflux disease)   . Glaucoma   . High cholesterol   . History of chicken pox   . Hyperglycemia   . Hyperlipidemia   . Hypertension   . LVH (left ventricular hypertrophy)   . PAC (premature atrial contraction)   . Palpitations   . Peripheral vascular disease ([place])   . Personal history of radiation therapy 2016   BREAST CA  . Pneumonia March 2014  . Skin cancer 2013  . Sleep apnea    wears CPAP set at 2.5  . Wears glasses    Past Surgical History:  Procedure Laterality Date  . BACK SURGERY  1986   ruptured disc  . BREAST  BIOPSY Left 2008   NEG  . BREAST BIOPSY Left 08-20-14   POS  . BREAST BIOPSY Right 2007   NEG  . BREAST EXCISIONAL BIOPSY Left 1984   NEG  . 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 BREAST SURGERY Left 09-05-14   left breast lumpectomy with SLN biopsy, ER/PR positive. HER2 negative.  [name] CARPAL TUNNEL RELEASE    . CHOLECYSTECTOMY  2006  . COLONOSCOPY  2010   Dr. Jamal Collin  . COLONOSCOPY WITH PROPOFOL N/A 05/17/2017   Procedure: COLONOSCOPY WITH PROPOFOL;  Surgeon: Manya Silvas, MD;  Location: [facility] ENDOSCOPY;  Service: Endoscopy;  Laterality: N/A;  . DILATION AND CURETTAGE OF UTERUS    . ESOPHAGOGASTRODUODENOSCOPY (EGD) WITH PROPOFOL N/A 05/17/2017   Procedure: ESOPHAGOGASTRODUODENOSCOPY (EGD) WITH PROPOFOL;  Surgeon: Manya Silvas, MD;  Location: [facility] ENDOSCOPY;  Service: Endoscopy;  Laterality: N/A;  . LAPAROTOMY FOR REMOVAL TUMOR LUMBAR PLEXES    . Leg stent  2011  . MOLE REMOVAL  2013   15 removed  . POSTERIOR CERVICAL LAMINECTOMY Left 10/15/2015   Procedure: Left Cervical four- five Hemilaminectomy/Remove mass;  Surgeon: Leeroy Cha, MD;  Location: [facility]  NEURO ORS;  Service: Neurosurgery;  Laterality: Left;  Left C4-5 Hemilaminectomy/Remove mass   Family History  Problem Relation Age of Onset  . Cancer Father        Prostate  . Diabetes Father   . Cerebrovascular Accident Father   . Hypertension Mother   . AAA (abdominal aortic aneurysm) Mother   . Hyperlipidemia Other        Parent  . Miscarriages / Stillbirths Other        Parent  . Hypertension Other        parent  . Heart disease Other        Parent  . Breast cancer Maternal Aunt 72   Social History   Socioeconomic History  . Marital status: Married    Spouse name: Not on file  . Number of children: 3  . Years of education: 6  . Highest education level: Not on file  Occupational History  . Occupation: Caregiver   Social Needs  . Financial resource strain: Not on file  . Food insecurity:    Worry: Not on  file    Inability: Not on file  . Transportation needs:    Medical: Not on file    Non-medical: Not on file  Tobacco Use  . Smoking status: Never Smoker  . Smokeless tobacco: Never Used  Substance and Sexual Activity  . Alcohol use: No    Alcohol/week: 0.0 oz  . Drug use: No  . Sexual activity: Never  Lifestyle  . Physical activity:    Days per week: Not on file    Minutes per session: Not on file  . Stress: Not on file  Relationships  . Social connections:    Talks on phone: Not on file    Gets together: Not on file    Attends religious service: Not on file    Active member of club or organization: Not on file    Attends meetings of clubs or organizations: Not on file    Relationship status: Not on file  Other Topics Concern  . Not on file  Social History Narrative   Regular exercise-mo   Caffeine Use-no    Outpatient Encounter Medications as of 08/25/2017  Medication Sig  . aspirin (BAYER ASPIRIN) 325 MG tablet Take 325 mg by mouth daily.  . carvedilol (COREG) 12.5 MG tablet Take by mouth.  . Cholecalciferol (VITAMIN D-1000 MAX ST) 1000 units tablet Take 1,000 Units by mouth daily.   [name] letrozole (FEMARA) 2.5 MG tablet TAKE 1 TABLET BY MOUTH EVERY DAY  . losartan-hydrochlorothiazide (HYZAAR) 100-25 MG tablet TAKE 1 TABLET BY MOUTH DAILY  . meclizine (ANTIVERT) 25 MG tablet Take 1 tablet (25 mg total) by mouth 3 (three) times daily as needed for dizziness or nausea.  [name] omeprazole (PRILOSEC) 40 MG capsule Take by mouth.  . rosuvastatin (CRESTOR) 10 MG tablet TAKE 1 TABLET BY MOUTH EVERY DAY  . vitamin E 400 UNIT capsule Take 400 Units by mouth daily.   No facility-administered encounter medications on file as of 08/25/2017.     Review of Systems  Constitutional: Negative for appetite change.       Has gained some weight.  Not watching her diet as well.   HENT: Negative for congestion and sinus pressure.   Respiratory: Negative for cough, chest tightness and shortness  of breath.   Cardiovascular: Negative for chest pain, palpitations and leg swelling.  Gastrointestinal: Negative for abdominal pain, diarrhea, nausea and vomiting.  Genitourinary: Negative for  difficulty urinating and dysuria.  Musculoskeletal: Negative for joint swelling and myalgias.  Skin: Negative for color change and rash.  Neurological: Negative for dizziness, light-headedness and headaches.  Psychiatric/Behavioral: Negative for agitation and dysphoric mood.       Objective:    Physical Exam  Constitutional: She appears well-developed and well-nourished. No distress.  HENT:  Nose: Nose normal.  Mouth/Throat: Oropharynx is clear and moist.  Neck: Neck supple. No thyromegaly present.  Cardiovascular: Normal rate and regular rhythm.  Pulmonary/Chest: Breath sounds normal. No respiratory distress. She has no wheezes.  Abdominal: Soft. Bowel sounds are normal. There is no tenderness.  Musculoskeletal: She exhibits no edema or tenderness.  Lymphadenopathy:    She has no cervical adenopathy.  Skin: No rash noted. No erythema.  Psychiatric: She has a normal mood and affect. Her behavior is normal.    BP (!) 148/80 (BP Location: Right Arm, Patient Position: Sitting, Cuff Size: Large)   Pulse 64   Temp 98.3 F (36.8 C) (Oral)   Resp 18   Wt 206 lb (93.4 kg)   SpO2 98%   BMI 34.28 kg/m  Wt Readings from Last 3 Encounters:  08/25/17 206 lb (93.4 kg)  06/30/17 202 lb 11.4 oz (92 kg)  05/17/17 195 lb (88.5 kg)     Lab Results  Component Value Date   WBC 5.8 09/25/2016   HGB 13.2 09/25/2016   HCT 37.6 09/25/2016   PLT 199 09/25/2016   GLUCOSE 133 (H) 08/24/2017   CHOL 175 08/24/2017   TRIG 223.0 (H) 08/24/2017   HDL 39.10 08/24/2017   LDLDIRECT 117.0 08/24/2017   LDLCALC 64 04/15/2017   ALT 30 08/24/2017   AST 19 08/24/2017   NA 137 08/24/2017   K 4.2 08/24/2017   CL 106 08/24/2017   CREATININE 0.84 08/24/2017   BUN 22 08/24/2017   CO2 22 08/24/2017   TSH 5.07  (H) 08/24/2017   INR 0.9 07/13/2012   HGBA1C 6.8 (H) 08/24/2017   MICROALBUR 0.3 12/17/2016    Mm Diag Breast Tomo Bilateral  Result Date: 08/18/2017 CLINICAL DATA:  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 EXAM: DIGITAL DIAGNOSTIC BILATERAL MAMMOGRAM WITH CAD AND TOMO COMPARISON:  02/23/2017 and earlier ACR Breast Density Category b: There are scattered areas of fibroglandular density. FINDINGS: Post operative changes are seen in the LEFTbreast. Seroma cavity associated with rim calcifications is again identified in the posterolateral portion of the LEFT breast. No suspicious microcalcifications identified. Mammographic images were processed with CAD. IMPRESSION: Benign changes after LEFT lumpectomy. No mammographic evidence for malignancy. RECOMMENDATION: Diagnostic mammogram is suggested in 1 year. (Code:DM-B-01Y) I have discussed the findings and recommendations with the patient. Results were also provided in writing at the conclusion of the visit. If applicable, a reminder letter will be sent to the patient regarding the next appointment. BI-RADS CATEGORY  2: Benign. Electronically Signed   By: Nolon Nations M.D.   On: 08/18/2017 15:17       Assessment & Plan:   Problem List Items Addressed This Visit    Abnormal liver function test    Follow liver panel.  Diet and exercise and weight loss.        Breast cancer [facility])    Has been followed by Dr Jamal Collin.  Due to see Dr Bary Castilla tomorrow.  Mammogram 08/18/17 - Birads II.        CAD (coronary artery disease)    Sees cardiology.  Continue risk factor modification.  Feels better.  Carotid artery stenosis    Evaluated by Dr Lucky Cowboy 04/14/16 (1-39% - carotid ultrasound).  Recommended f/u in 2 years.        COPD (chronic obstructive pulmonary disease) ([place])    Just saw Dr Raul Del.  States diagnosed with stage I COPD.  Breathing stable.  Note reviewed.       GERD (gastroesophageal reflux disease)    Controlled on current regimen.        Hypercholesterolemia    Tolerating crestor.  Discussed cholesterol results and history.  She declines to increase crestor at this time.  Follow lipid panel and liver function tests.       Hypertension    Blood pressure as outlined.  Continue same medication regimen.  Monitor sodium in diet.  Follow pressures.        Peripheral vascular disease ([place])    Evaluated by Dr Lucky Cowboy 04/14/16 - normal ABIs.  Recommended f/u in 2 years.       Stress    Handling stress.  Follow.        Thyroid nodule    Evaluated by Dr Gabriel Carina.  Biopsy negative.  Follow thyroid function. Recent tsh slightly elevated.  Recheck tsh in the next 6-8 weeks.         Other Visit Diagnoses    Elevated TSH    -  Primary   Relevant Orders   TSH       Einar Pheasant, MD

## 2017-08-26 ENCOUNTER — Ambulatory Visit: Payer: Medicare Other | Admitting: General Surgery

## 2017-08-26 ENCOUNTER — Encounter: Payer: Self-pay | Admitting: General Surgery

## 2017-08-26 VITALS — BP 142/68 | HR 60 | Resp 14 | Ht 65.0 in | Wt 206.0 lb

## 2017-08-26 DIAGNOSIS — Z170 Estrogen receptor positive status [ER+]: Secondary | ICD-10-CM

## 2017-08-26 DIAGNOSIS — J449 Chronic obstructive pulmonary disease, unspecified: Secondary | ICD-10-CM | POA: Insufficient documentation

## 2017-08-26 DIAGNOSIS — C50512 Malignant neoplasm of lower-outer quadrant of left female breast: Secondary | ICD-10-CM

## 2017-08-26 DIAGNOSIS — G4733 Obstructive sleep apnea (adult) (pediatric): Secondary | ICD-10-CM | POA: Diagnosis not present

## 2017-08-26 NOTE — Assessment & Plan Note (Signed)
Has been followed by Dr Jamal Collin.  Due to see Dr Bary Castilla tomorrow.  Mammogram 08/18/17 - Birads II.

## 2017-08-26 NOTE — Assessment & Plan Note (Signed)
Follow liver panel.  Diet and exercise and weight loss.

## 2017-08-26 NOTE — Assessment & Plan Note (Signed)
Blood pressure as outlined.  Continue same medication regimen.  Monitor sodium in diet.  Follow pressures.

## 2017-08-26 NOTE — Assessment & Plan Note (Signed)
Just saw Dr Raul Del.  States diagnosed with stage I COPD.  Breathing stable.  Note reviewed.

## 2017-08-26 NOTE — Assessment & Plan Note (Signed)
Evaluated by Dr Lucky Cowboy 04/14/16 - normal ABIs.  Recommended f/u in 2 years.

## 2017-08-26 NOTE — Assessment & Plan Note (Addendum)
Evaluated by Dr Gabriel Carina.  Biopsy negative.  Follow thyroid function. Recent tsh slightly elevated.  Recheck tsh in the next 6-8 weeks.

## 2017-08-26 NOTE — Patient Instructions (Addendum)
The patient is aware to call back for any questions or new concerns. The patient has been asked to return to the office in one year with a bilateral diagnostic mammogram.  

## 2017-08-26 NOTE — Assessment & Plan Note (Signed)
Sees cardiology.  Continue risk factor modification.  Feels better.

## 2017-08-26 NOTE — Assessment & Plan Note (Signed)
Evaluated by Dr Lucky Cowboy 04/14/16 (1-39% - carotid ultrasound).  Recommended f/u in 2 years.

## 2017-08-26 NOTE — Assessment & Plan Note (Signed)
Tolerating crestor.  Discussed cholesterol results and history.  She declines to increase crestor at this time.  Follow lipid panel and liver function tests.

## 2017-08-26 NOTE — Progress Notes (Signed)
[name], female   DOB: 22-Apr-1946, 72 y.o.   MRN: [id number]  Chief Complaint  Patient presents with  . Follow-up    HPI Lindsey Bentley is a 72 y.o. female.  who presents for her follow up left breast cancer and a breast evaluation. The most recent mammogram was done on 08-18-17. Former patient of Dr Jamal Collin. Patient does perform regular self breast checks and gets regular mammograms done.   Occasional pain at incision. Tolerating the letrozole. She is retired from [employer] day[employer]. HPI  Past Medical History:  Diagnosis Date  . Arthritis   . Breast cancer ([place]) 2016   LT LUMPECTOMY - TI, NO, MO - IDC, ER/PR pos, Her 2 neg  . Carotid artery occlusion   . Carotid artery occlusion   . 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 Coronary artery disease   . Depression   . Diffuse cystic mastopathy 2013  . DVT (deep venous thrombosis) ([place])   . Dyspnea   . Elevated TSH   . Family history of adverse reaction to anesthesia    Pt stated that son had a seizure with a combination of anesthesia and pain medication."  . GERD (gastroesophageal reflux disease)   . Glaucoma   . High cholesterol   . History of chicken pox   . Hyperglycemia   . Hyperlipidemia   . Hypertension   . LVH (left ventricular hypertrophy)   . PAC (premature atrial contraction)   . Palpitations   . Peripheral vascular disease ([place])   . Personal history of radiation therapy 2016   BREAST CA  . Pneumonia March 2014  . Skin cancer 2013  . Sleep apnea    wears CPAP set at 2.5  . Wears glasses     Past Surgical History:  Procedure Laterality Date  . BACK SURGERY  1986   ruptured disc  . BREAST BIOPSY Left 2008   NEG  . BREAST BIOPSY Left 08-20-14   POS  . BREAST BIOPSY Right 2007   NEG  . BREAST EXCISIONAL BIOPSY Left 1984   NEG  . 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 BREAST SURGERY Left 09-05-14   left breast lumpectomy with SLN biopsy, ER/PR positive.  HER2 negative.  [name] CARPAL TUNNEL RELEASE    . CHOLECYSTECTOMY  2006  . COLONOSCOPY  2010   Dr. Jamal Collin  . COLONOSCOPY WITH PROPOFOL N/A 05/17/2017   Procedure: COLONOSCOPY WITH PROPOFOL;  Surgeon: Manya Silvas, MD;  Location: [facility] ENDOSCOPY;  Service: Endoscopy;  Laterality: N/A;  . DILATION AND CURETTAGE OF UTERUS    . ESOPHAGOGASTRODUODENOSCOPY (EGD) WITH PROPOFOL N/A 05/17/2017   Procedure: ESOPHAGOGASTRODUODENOSCOPY (EGD) WITH PROPOFOL;  Surgeon: Manya Silvas, MD;  Location: [facility] ENDOSCOPY;  Service: Endoscopy;  Laterality: N/A;  . LAPAROTOMY FOR REMOVAL TUMOR LUMBAR PLEXES    . Leg stent  2011  . MOLE REMOVAL  2013   15 removed  . POSTERIOR CERVICAL LAMINECTOMY Left 10/15/2015   Procedure: Left Cervical four- five Hemilaminectomy/Remove mass;  Surgeon: Leeroy Cha, MD;  Location: [place] NEURO ORS;  Service: Neurosurgery;  Laterality: Left;  Left C4-5 Hemilaminectomy/Remove mass    Family History  Problem Relation Age of Onset  . Cancer Father        Prostate  . Diabetes Father   . Cerebrovascular Accident Father   . Hypertension Mother   . AAA (abdominal aortic aneurysm) Mother   . Hyperlipidemia Other  Parent  . Miscarriages / Stillbirths Other        Parent  . Hypertension Other        parent  . Heart disease Other        Parent  . Breast cancer Maternal Aunt 72    Social History Social History   Tobacco Use  . Smoking status: Never Smoker  . Smokeless tobacco: Never Used  Substance Use Topics  . Alcohol use: No    Alcohol/week: 0.0 oz  . Drug use: No    Allergies  Allergen Reactions  . Codeine Nausea And Vomiting  . Adhesive [Tape] Itching and Rash    Please use "paper" tape  . Prednisone Swelling  . Statins Other (See Comments)    Muscle Pain, can take Crestor    Current Outpatient Medications  Medication Sig Dispense Refill  . aspirin (BAYER ASPIRIN) 325 MG tablet Take 325 mg by mouth daily.    . Cholecalciferol (VITAMIN D-1000 MAX ST)  1000 units tablet Take 1,000 Units by mouth daily.     [name] letrozole (FEMARA) 2.5 MG tablet TAKE 1 TABLET BY MOUTH EVERY DAY 90 tablet 4  . losartan-hydrochlorothiazide (HYZAAR) 100-25 MG tablet TAKE 1 TABLET BY MOUTH DAILY 90 tablet 0  . meclizine (ANTIVERT) 25 MG tablet Take 1 tablet (25 mg total) by mouth 3 (three) times daily as needed for dizziness or nausea. 20 tablet 0  . omeprazole (PRILOSEC) 40 MG capsule Take by mouth.    . rosuvastatin (CRESTOR) 10 MG tablet TAKE 1 TABLET BY MOUTH EVERY DAY 30 tablet 3  . vitamin E 400 UNIT capsule Take 400 Units by mouth daily.    . carvedilol (COREG) 12.5 MG tablet Take by mouth.     No current facility-administered medications for this visit.     Review of Systems Review of Systems  Constitutional: Negative.   Respiratory: Negative.   Cardiovascular: Negative.     Blood pressure (!) 142/68, pulse 60, resp. rate 14, height '5\' 5"'$  (1.651 m), weight 206 lb (93.4 kg), SpO2 96 %.  Physical Exam Physical Exam  Constitutional: She is oriented to person, place, and time. She appears well-developed and well-nourished.  HENT:  Mouth/Throat: Oropharynx is clear and moist.  Eyes: Conjunctivae are normal. No scleral icterus.  Neck: Neck supple.  Cardiovascular: Normal rate and normal heart sounds. An irregular rhythm present.  Pulmonary/Chest: Effort normal and breath sounds normal. Right breast exhibits no inverted nipple, no mass, no nipple discharge, no skin change and no tenderness. Left breast exhibits no inverted nipple, no mass, no nipple discharge, no skin change and no tenderness.    Left breast incision well healed.  Lymphadenopathy:    She has no cervical adenopathy.    She has no axillary adenopathy.  Neurological: She is alert and oriented to person, place, and time.  Skin: Skin is warm and dry.  Psychiatric: Her behavior is normal.    Data Reviewed Bilateral diagnostic mammograms dated August 18, 2017 were reviewed.  Postsurgical  change.  BI-RADS-2.  Good review does not show evidence of a bone density in the past 2 years.    Assessment    No evidence of recurrent breast cancer.  Good tolerance of antiestrogen therapy.    Plan    The patient has been asked to return to the office in one year with a bilateral diagnostic mammogram.  We will contact the patient to determine if a bone density has been done at another facility, if  not we will arrange for same in the near future.     HPI, Physical Exam, Assessment and Plan have been scribed under the direction and in the presence of Robert Bellow, MD. Karie Fetch, RN  I have completed the exam and reviewed the above documentation for accuracy and completeness.  I agree with the above.  Haematologist has been used and any errors in dictation or transcription are unintentional.  Hervey Ard, M.D., F.A.C.S.   Forest Gleason Daeshawn Redmann 08/27/2017, 7:18 AM

## 2017-08-26 NOTE — Assessment & Plan Note (Signed)
Handling stress.  Follow.   

## 2017-08-26 NOTE — Assessment & Plan Note (Signed)
Controlled on current regimen.   

## 2017-08-27 ENCOUNTER — Encounter: Payer: Self-pay | Admitting: General Surgery

## 2017-08-31 ENCOUNTER — Other Ambulatory Visit: Payer: Self-pay

## 2017-08-31 ENCOUNTER — Telehealth: Payer: Self-pay

## 2017-08-31 DIAGNOSIS — C50512 Malignant neoplasm of lower-outer quadrant of left female breast: Secondary | ICD-10-CM

## 2017-08-31 DIAGNOSIS — Z170 Estrogen receptor positive status [ER+]: Principal | ICD-10-CM

## 2017-08-31 DIAGNOSIS — Z780 Asymptomatic menopausal state: Secondary | ICD-10-CM

## 2017-08-31 NOTE — Telephone Encounter (Signed)
Patient states that she has not had a Bone Density done in the last 2 years. I have placed an order for this and she will call the [facility] herself to schedule this at her convenience.

## 2017-08-31 NOTE — Telephone Encounter (Signed)
-----   Message from Robert Bellow, MD sent at 08/27/2017  7:18 AM EDT ----- These contact the patient and see if she has had a bone density within the last 2 years.  If she has, please get a copy of the results.  If not please arrange.  Thank you

## 2017-09-13 ENCOUNTER — Other Ambulatory Visit: Payer: Self-pay | Admitting: Internal Medicine

## 2017-09-23 IMAGING — CR DG MYELOGRAPHY LUMBAR INJ CERVICAL
6 of 14 series · 6 of 14 positions shown · non-contrast
Comparison: none

CLINICAL DATA: Neck pain. LEFT shoulder pain. Decreased range of
motion
TECHNIQUE: Contiguous axial images were obtained through the Cervical spine
after the intrathecal infusion of infusion. Coronal and sagittal
reconstructions were obtained of the axial image sets.

[w cervical spine lat]
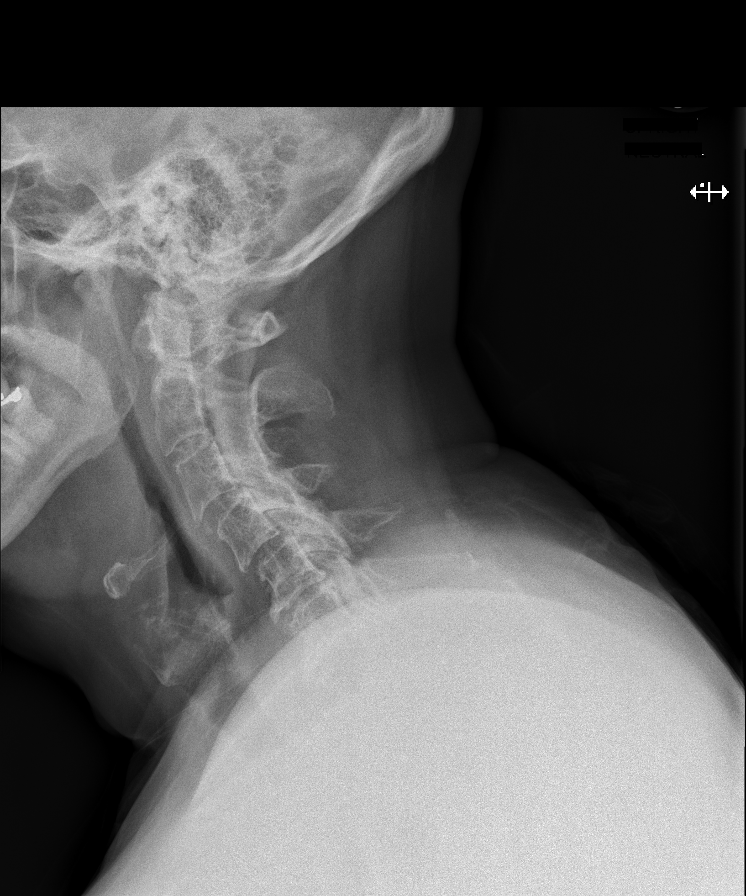

[vasc adipose (1 of 3)]
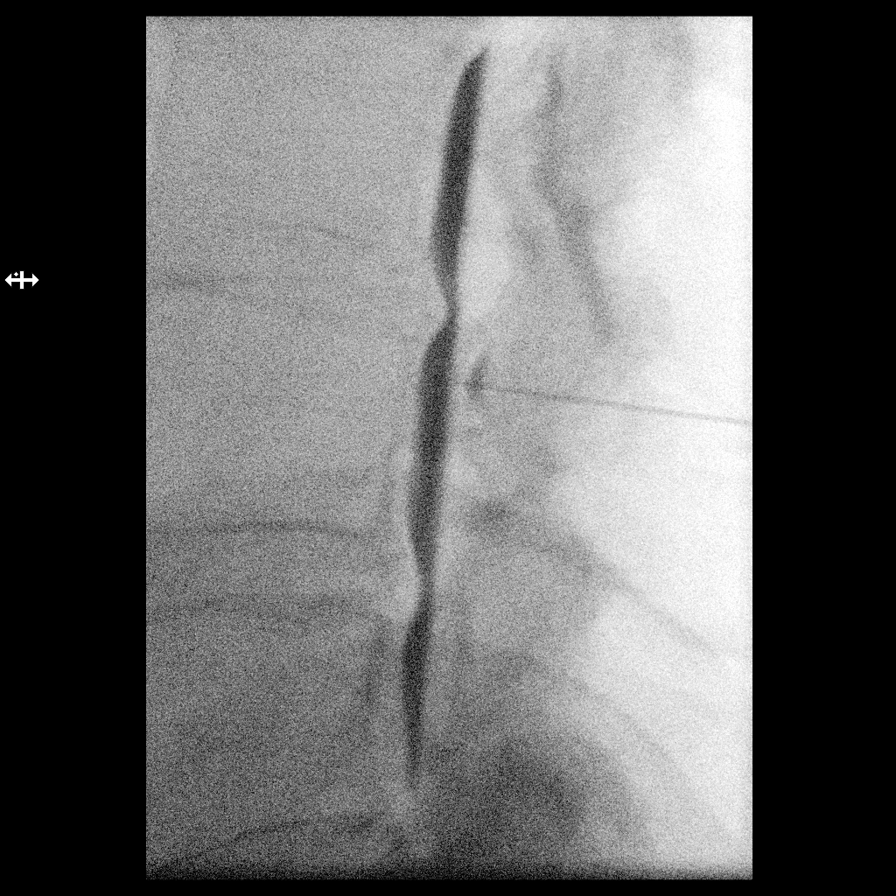

[w cervical spine flexion]
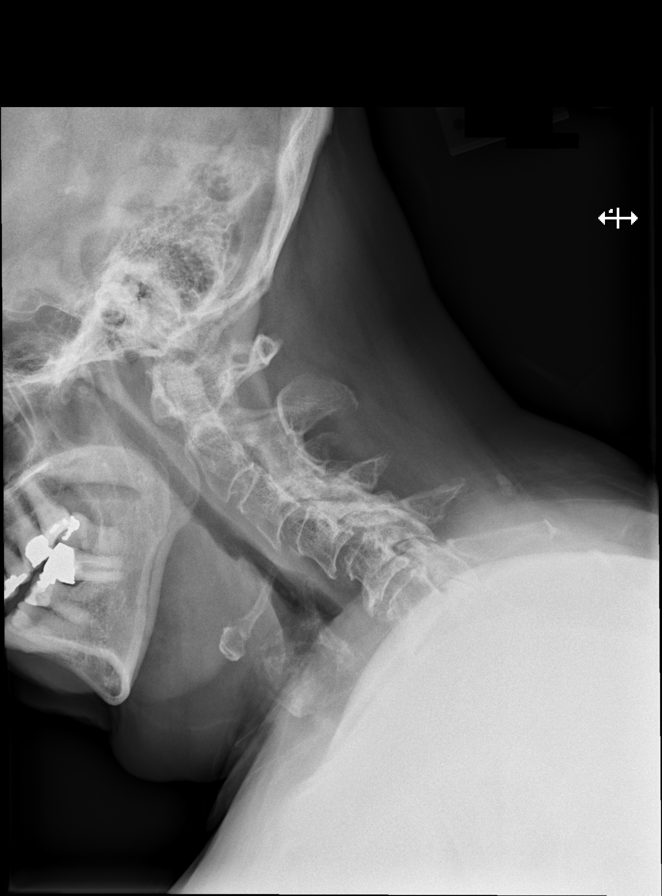

[vasc adipose (2 of 3)]
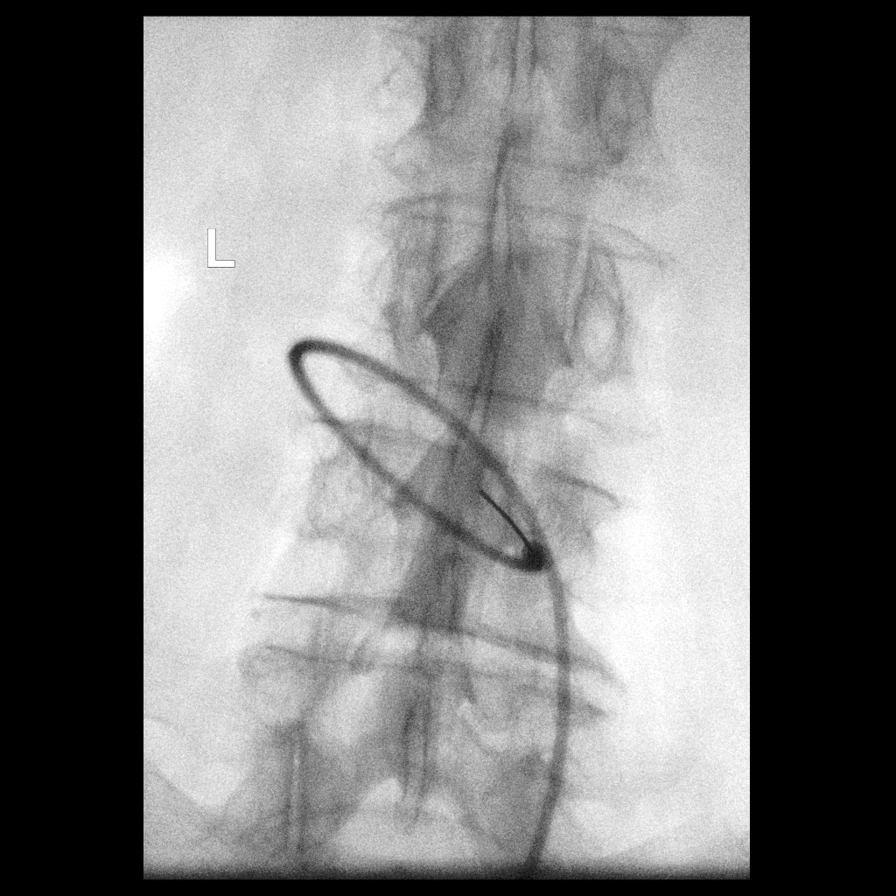

[w cervical spine extension]
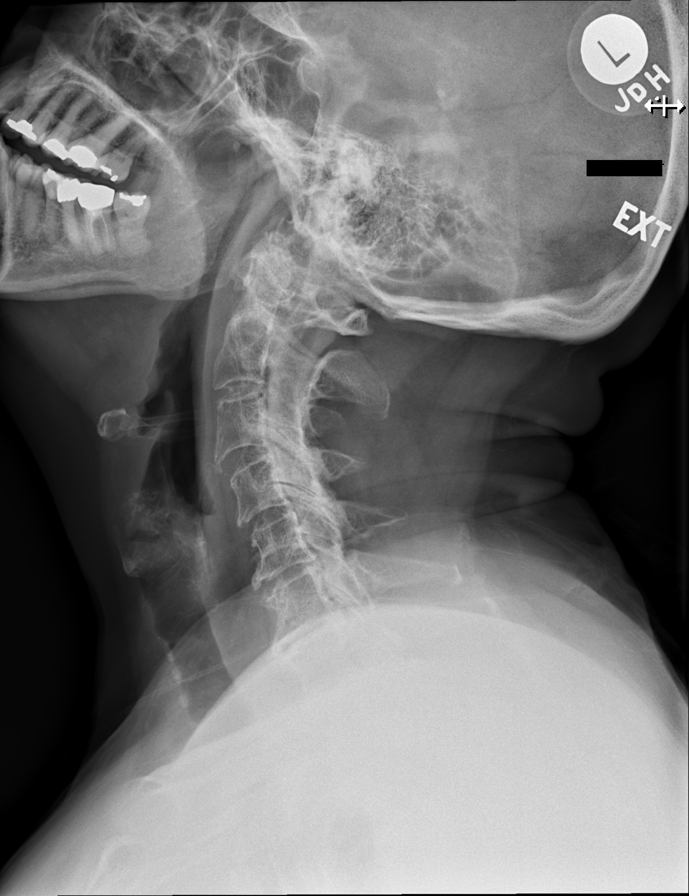

[vasc adipose (3 of 3)]
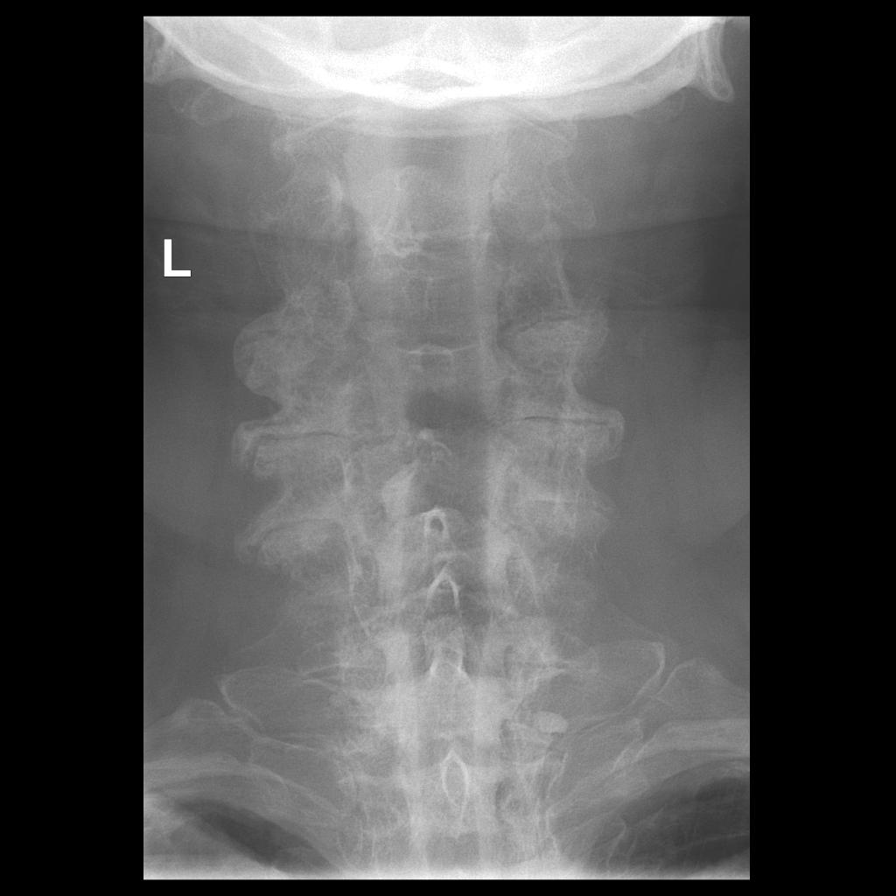

[6 of 14 positions shown; findings below may reference images not displayed]

FLUOROSCOPY TIME:  43 seconds corresponding to a Dose Area Product
of 125 ?Gy*m2

PROCEDURE:
LUMBAR PUNCTURE FOR CERVICAL MYELOGRAM

After thorough discussion of risks and benefits of the procedure
including bleeding, infection, injury to nerves, blood vessels,
adjacent structures as well as headache and CSF leak, written and
oral informed consent was obtained. Consent was obtained by Dr. Finec
Gullermina. We discussed the high likelihood of obtaining a diagnostic
study.

Patient was positioned prone on the fluoroscopy table. Local
anesthesia was provided with 1% lidocaine without epinephrine after
prepped and draped in the usual sterile fashion. Puncture was
performed at L3-L4 using a 3 1/2 inch 22-gauge spinal needle via
RIGHT paramedian approach. Using a single pass through the dura, the
needle was placed within the thecal sac, with return of clear CSF.
10 mL of Isovue-M 300 was injected into the thecal sac, with normal
opacification of the nerve roots and cauda equina consistent with
free flow within the subarachnoid space. The patient was then moved
to the trendelenburg position and contrast flowed into the Cervical
spine region.

I personally performed the lumbar puncture and administered the
intrathecal contrast. I also personally supervised acquisition of
the myelogram images.
FINDINGS: CERVICAL MYELOGRAM FINDINGS:

Good opacification cervical subarachnoid space. Severe disc space
narrowing at C5-6 and C6-7, with effacement of the exiting C6 and C7
nerve roots, LEFT greater than RIGHT, at those levels respectively.
There is also a large asymmetric extradural defect at C4-5 on the
LEFT related to posterior element hypertrophy.

Upright Lateral flexion extension views demonstrate 1 mm
anterolisthesis at C3-4 and 2 mm anterolisthesis C4-5 which are
facet mediated, slightly worse in flexion, reducing to anatomic
alignment and extension. No significant movement at C5-6 or C6-7.

CT CERVICAL MYELOGRAM FINDINGS:

Alignment: Trace anterolisthesis C3-4 and C4-5 facet mediated.

Vertebrae: Osseous spurring, but no destructive lesion.

Cord: Multilevel stenosis with displacement and flattening,
described below.

Posterior Fossa: No tonsillar herniation.

Vertebral Arteries: Not assessed.

Paraspinal tissues: Unremarkable. Lung apices clear.
Atherosclerosis.

Disc levels:

The individual disc spaces were examined as follows:

C2-3: BILATERAL facet arthropathy. Spontaneous Posterior facet
arthrodesis on the LEFT. Slight osseous spurring. Slight LEFT
foraminal narrowing does not clearly affect the C3 nerve root.

C3-4: Trace anterolisthesis is facet mediated. Severe BILATERAL
facet arthropathy. Uncinate spurring worse on the LEFT. LEFT greater
than RIGHT C4 nerve root impingement.

C4-5: Severe BILATERAL facet arthropathy with ossified facet spur
eccentric to the LEFT, identified on prior neck CT. This results in
a 9 x 10 mm extradural mass displacing the cord from LEFT to RIGHT.
Severe LEFT C5 nerve root impingement.

C5-6: Advanced facet arthropathy. Medial facet ossification also
extends into the epidural space on the LEFT, not as severe as the
level above. Disc space narrowing with uncinate spurring and disc
osteophyte complex. LEFT greater than RIGHT C6 nerve root
impingement. Mild cord flattening. Canal diameter 7-8 mm.

C6-7: Severe disc space narrowing. Disc osteophyte complex extends
more to the LEFT. LEFT greater than RIGHT C7 nerve root impingement.
Mild cord flattening. Canal diameter

C7-T1:  Unremarkable.
IMPRESSION: Multilevel spondylosis as described, with severe LEFT-sided neural
impingement at C4-5 and C5-6 due to medially projecting and ossific
facet arthropathy and superimposed disc pathology.

Disc space narrowing with osseous spurring results in LEFT greater
than RIGHT foraminal narrowing at C6-7.

Slight facet mediated anterolisthesis at C3-4 and C4-5 does not
demonstrate significant dynamic instability.

## 2017-10-05 ENCOUNTER — Ambulatory Visit
Admission: RE | Admit: 2017-10-05 | Discharge: 2017-10-05 | Disposition: A | Discharge status: Home / Self Care | Payer: Medicare Other | Source: Ambulatory Visit | Attending: General Surgery | Admitting: General Surgery

## 2017-10-05 DIAGNOSIS — C50512 Malignant neoplasm of lower-outer quadrant of left female breast: Secondary | ICD-10-CM | POA: Diagnosis not present

## 2017-10-05 DIAGNOSIS — G4733 Obstructive sleep apnea (adult) (pediatric): Secondary | ICD-10-CM | POA: Diagnosis not present

## 2017-10-05 DIAGNOSIS — M8589 Other specified disorders of bone density and structure, multiple sites: Secondary | ICD-10-CM | POA: Diagnosis not present

## 2017-10-05 DIAGNOSIS — Z780 Asymptomatic menopausal state: Secondary | ICD-10-CM | POA: Insufficient documentation

## 2017-10-05 DIAGNOSIS — Z170 Estrogen receptor positive status [ER+]: Secondary | ICD-10-CM | POA: Insufficient documentation

## 2017-10-06 ENCOUNTER — Telehealth: Payer: Self-pay | Admitting: *Deleted

## 2017-10-06 NOTE — Telephone Encounter (Signed)
-----   Message from Robert Bellow, MD sent at 10/05/2017  5:44 PM EDT ----- Please notify the patient that the bone density exam is essentially unchanged.  If she is not already, she should be making use of 1200 mg of calcium with vitamin D daily.  Weightbearing exercises also encouraged.  Thank you ----- Message ----- From: Interface, Rad Results In Sent: 10/05/2017   3:16 PM To: Robert Bellow, MD

## 2017-10-06 NOTE — Telephone Encounter (Signed)
Notified patient as instructed, patient pleased. Discussed follow-up appointments, patient agrees  

## 2017-10-18 DIAGNOSIS — I493 Ventricular premature depolarization: Secondary | ICD-10-CM | POA: Diagnosis not present

## 2017-10-18 DIAGNOSIS — R600 Localized edema: Secondary | ICD-10-CM | POA: Diagnosis not present

## 2017-10-18 DIAGNOSIS — I10 Essential (primary) hypertension: Secondary | ICD-10-CM | POA: Diagnosis not present

## 2017-10-18 DIAGNOSIS — R001 Bradycardia, unspecified: Secondary | ICD-10-CM | POA: Insufficient documentation

## 2017-10-18 DIAGNOSIS — R002 Palpitations: Secondary | ICD-10-CM | POA: Diagnosis not present

## 2017-10-18 DIAGNOSIS — G4733 Obstructive sleep apnea (adult) (pediatric): Secondary | ICD-10-CM | POA: Insufficient documentation

## 2017-10-19 ENCOUNTER — Other Ambulatory Visit (INDEPENDENT_AMBULATORY_CARE_PROVIDER_SITE_OTHER): Payer: Medicare Other

## 2017-10-19 ENCOUNTER — Other Ambulatory Visit: Payer: Medicare Other

## 2017-10-19 DIAGNOSIS — R7989 Other specified abnormal findings of blood chemistry: Secondary | ICD-10-CM | POA: Diagnosis not present

## 2017-10-20 LAB — TSH: TSH: 4.12 u[IU]/mL (ref 0.35–4.50)

## 2017-11-09 DIAGNOSIS — G4733 Obstructive sleep apnea (adult) (pediatric): Secondary | ICD-10-CM | POA: Diagnosis not present

## 2017-11-17 DIAGNOSIS — R0609 Other forms of dyspnea: Secondary | ICD-10-CM | POA: Diagnosis not present

## 2017-11-17 DIAGNOSIS — J849 Interstitial pulmonary disease, unspecified: Secondary | ICD-10-CM | POA: Diagnosis not present

## 2017-11-17 DIAGNOSIS — J449 Chronic obstructive pulmonary disease, unspecified: Secondary | ICD-10-CM | POA: Diagnosis not present

## 2017-11-23 ENCOUNTER — Other Ambulatory Visit: Payer: Self-pay | Admitting: Pharmacist

## 2017-11-23 ENCOUNTER — Telehealth: Payer: Self-pay

## 2017-11-23 ENCOUNTER — Other Ambulatory Visit: Payer: Self-pay

## 2017-11-23 NOTE — Telephone Encounter (Signed)
Copied from [place] [phone]. Topic: General - Other >> Nov 23, 2017  9:09 AM Keene Breath wrote: Reason for CRM: Patient called to speak with the nurse or doctor regarding her medications.  She received a phone call that stated she has not been picking up her prescriptions from the pharmacy and she stated that she has been taking all of her medications.  Please advise.  CB# [phone].

## 2017-11-23 NOTE — Patient Outreach (Signed)
[facility]) Care Management  11/23/2017  BELLAH ALIA 1946/04/14 [id number]   Outreach call to Lorelee Market regarding her request for follow up from the [facility] Medication Adherence Campaign. Speak with Ms. Spain who reports that she also called her PCP office this morning to request follow up about this call (also see this call record in Epic).  Explain to Ms. Bermea the reason for the call and counsel patient on the importance of medication adherence. Ms. burnetta kohls understanding and states that she takes her medications as directed using a weekly pillbox.   Patient denies any further medication questions/concerns at this time. Patient reports that she will review her medications with a nurse from her PCP's office when they return her call today.  PLAN  1) Will forward this note to patient's PCP office for coordination of care.  2) Will close pharmacy episode at this time.  Harlow Asa, PharmD, [place] [facility] [phone]

## 2017-11-23 NOTE — Progress Notes (Unsigned)
Patient has been getting her medication refilled and taking them regularly. Disregard message. Patient would also like to let you know that Dr. Raul Del put her on albuterol inhaler every 6 hrs and dx with stage 1 COPD.

## 2017-11-23 NOTE — Telephone Encounter (Signed)
Patient has been getting her medication refilled and taking them regularly. Disregard message. Patient would also like to let you know that Dr. Raul Del put her on albuterol inhaler every 6 hrs and dx with stage 1 COPD. Disregard other note that was sent to you, I routed in the wrong encounter.

## 2017-12-16 ENCOUNTER — Other Ambulatory Visit: Payer: Self-pay | Admitting: Internal Medicine

## 2017-12-21 DIAGNOSIS — G4733 Obstructive sleep apnea (adult) (pediatric): Secondary | ICD-10-CM | POA: Diagnosis not present

## 2018-01-11 DIAGNOSIS — G4733 Obstructive sleep apnea (adult) (pediatric): Secondary | ICD-10-CM | POA: Diagnosis not present

## 2018-01-12 ENCOUNTER — Other Ambulatory Visit: Payer: Self-pay | Admitting: Internal Medicine

## 2018-01-18 ENCOUNTER — Ambulatory Visit: Payer: Medicare Other | Admitting: Internal Medicine

## 2018-01-28 DIAGNOSIS — G4733 Obstructive sleep apnea (adult) (pediatric): Secondary | ICD-10-CM | POA: Diagnosis not present

## 2018-01-31 DIAGNOSIS — I10 Essential (primary) hypertension: Secondary | ICD-10-CM | POA: Diagnosis not present

## 2018-01-31 DIAGNOSIS — J449 Chronic obstructive pulmonary disease, unspecified: Secondary | ICD-10-CM | POA: Diagnosis not present

## 2018-01-31 DIAGNOSIS — G4733 Obstructive sleep apnea (adult) (pediatric): Secondary | ICD-10-CM | POA: Diagnosis not present

## 2018-01-31 DIAGNOSIS — K219 Gastro-esophageal reflux disease without esophagitis: Secondary | ICD-10-CM | POA: Diagnosis not present

## 2018-01-31 DIAGNOSIS — I259 Chronic ischemic heart disease, unspecified: Secondary | ICD-10-CM | POA: Diagnosis not present

## 2018-02-07 ENCOUNTER — Other Ambulatory Visit: Payer: Self-pay | Admitting: Internal Medicine

## 2018-02-24 ENCOUNTER — Ambulatory Visit (INDEPENDENT_AMBULATORY_CARE_PROVIDER_SITE_OTHER): Payer: Medicare Other | Admitting: Internal Medicine

## 2018-02-24 ENCOUNTER — Encounter: Payer: Self-pay | Admitting: Internal Medicine

## 2018-02-24 DIAGNOSIS — R945 Abnormal results of liver function studies: Secondary | ICD-10-CM

## 2018-02-24 DIAGNOSIS — C50912 Malignant neoplasm of unspecified site of left female breast: Secondary | ICD-10-CM

## 2018-02-24 DIAGNOSIS — I739 Peripheral vascular disease, unspecified: Secondary | ICD-10-CM

## 2018-02-24 DIAGNOSIS — I6523 Occlusion and stenosis of bilateral carotid arteries: Secondary | ICD-10-CM

## 2018-02-24 DIAGNOSIS — I25119 Atherosclerotic heart disease of native coronary artery with unspecified angina pectoris: Secondary | ICD-10-CM

## 2018-02-24 DIAGNOSIS — E1159 Type 2 diabetes mellitus with other circulatory complications: Secondary | ICD-10-CM

## 2018-02-24 DIAGNOSIS — R7989 Other specified abnormal findings of blood chemistry: Secondary | ICD-10-CM

## 2018-02-24 DIAGNOSIS — E7800 Pure hypercholesterolemia, unspecified: Secondary | ICD-10-CM

## 2018-02-24 DIAGNOSIS — R1084 Generalized abdominal pain: Secondary | ICD-10-CM

## 2018-02-24 DIAGNOSIS — E041 Nontoxic single thyroid nodule: Secondary | ICD-10-CM

## 2018-02-24 DIAGNOSIS — J449 Chronic obstructive pulmonary disease, unspecified: Secondary | ICD-10-CM | POA: Diagnosis not present

## 2018-02-24 DIAGNOSIS — K219 Gastro-esophageal reflux disease without esophagitis: Secondary | ICD-10-CM

## 2018-02-24 DIAGNOSIS — I10 Essential (primary) hypertension: Secondary | ICD-10-CM

## 2018-02-24 DIAGNOSIS — R109 Unspecified abdominal pain: Secondary | ICD-10-CM

## 2018-02-24 NOTE — Progress Notes (Signed)
Patient ID: CONSETTA COSNER, female   DOB: [birth year], 72 y.o.   MRN: [id number]   Subjective:    Patient ID: Lorelee Market, female    DOB: 1946/01/02, 72 y.o.   MRN: [id number]  HPI  Patient here for a scheduled follow up.  She reports she feels better.  Saw pulmonary for f/u sob and DOE.  Following PFTS and recommended serial echo's.  Recommend prn albuterol.  F/u in 4-5 months.  Planning for f/u HRCT in 05/2018.  Breathing stable.  No chest pain.  Some abdominal discomfort.  Persistent.  On omeprazole.  Some occasional nausea.     Past Medical History:  Diagnosis Date  . Arthritis   . Breast cancer ([place]) 2016   LT LUMPECTOMY - TI, NO, MO - IDC, ER/PR pos, Her 2 neg  . Carotid artery occlusion   . Carotid artery occlusion   . 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 Coronary artery disease   . Depression   . Diffuse cystic mastopathy 2013  . DVT (deep venous thrombosis) ([place])   . Dyspnea   . Elevated TSH   . Family history of adverse reaction to anesthesia    Pt stated that son had a seizure with a combination of anesthesia and pain medication."  . GERD (gastroesophageal reflux disease)   . Glaucoma   . High cholesterol   . History of chicken pox   . Hyperglycemia   . Hyperlipidemia   . Hypertension   . LVH (left ventricular hypertrophy)   . PAC (premature atrial contraction)   . Palpitations   . Peripheral vascular disease ([place])   . Personal history of radiation therapy 2016   BREAST CA  . Pneumonia March 2014  . Skin cancer 2013  . Sleep apnea    wears CPAP set at 2.5  . Wears glasses    Past Surgical History:  Procedure Laterality Date  . BACK SURGERY  1986   ruptured disc  . BREAST BIOPSY Left 2008   NEG  . BREAST BIOPSY Left 08-20-14   POS  . BREAST BIOPSY Right 2007   NEG  . BREAST EXCISIONAL BIOPSY Left 1984   NEG  . 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 BREAST SURGERY Left 09/05/2014   `T1a,N0; 5 mm  ER/PR positive. HER2  negative.  Wide excision with SLN biopsy  . CARPAL TUNNEL RELEASE    . CHOLECYSTECTOMY  2006  . COLONOSCOPY  2010   Dr. Jamal Collin  . COLONOSCOPY WITH PROPOFOL N/A 05/17/2017   Procedure: COLONOSCOPY WITH PROPOFOL;  Surgeon: Manya Silvas, MD;  Location: [facility] ENDOSCOPY;  Service: Endoscopy;  Laterality: N/A;  . DILATION AND CURETTAGE OF UTERUS    . ESOPHAGOGASTRODUODENOSCOPY (EGD) WITH PROPOFOL N/A 05/17/2017   Procedure: ESOPHAGOGASTRODUODENOSCOPY (EGD) WITH PROPOFOL;  Surgeon: Manya Silvas, MD;  Location: [facility] ENDOSCOPY;  Service: Endoscopy;  Laterality: N/A;  . LAPAROTOMY FOR REMOVAL TUMOR LUMBAR PLEXES    . Leg stent  2011  . MOLE REMOVAL  2013   15 removed  . POSTERIOR CERVICAL LAMINECTOMY Left 10/15/2015   Procedure: Left Cervical four- five Hemilaminectomy/Remove mass;  Surgeon: Leeroy Cha, MD;  Location: [place] NEURO ORS;  Service: Neurosurgery;  Laterality: Left;  Left C4-5 Hemilaminectomy/Remove mass   Family History  Problem Relation Age of Onset  . Cancer Father        Prostate  . Diabetes Father   . Cerebrovascular Accident Father   .  Hypertension Mother   . AAA (abdominal aortic aneurysm) Mother   . Hyperlipidemia Other        Parent  . Miscarriages / Stillbirths Other        Parent  . Hypertension Other        parent  . Heart disease Other        Parent  . Breast cancer Maternal Aunt 72   Social History   Socioeconomic History  . Marital status: Married    Spouse name: Not on file  . Number of children: 3  . Years of education: 46  . Highest education level: Not on file  Occupational History  . Occupation: Caregiver   Social Needs  . Financial resource strain: Not on file  . Food insecurity:    Worry: Not on file    Inability: Not on file  . Transportation needs:    Medical: Not on file    Non-medical: Not on file  Tobacco Use  . Smoking status: Never Smoker  . Smokeless tobacco: Never Used  Substance and Sexual Activity  . Alcohol use: No     Alcohol/week: 0.0 standard drinks  . Drug use: No  . Sexual activity: Never  Lifestyle  . Physical activity:    Days per week: Not on file    Minutes per session: Not on file  . Stress: Not on file  Relationships  . Social connections:    Talks on phone: Not on file    Gets together: Not on file    Attends religious service: Not on file    Active member of club or organization: Not on file    Attends meetings of clubs or organizations: Not on file    Relationship status: Not on file  Other Topics Concern  . Not on file  Social History Narrative   Regular exercise-mo   Caffeine Use-no    Outpatient Encounter Medications as of 02/24/2018  Medication Sig  . aspirin (BAYER ASPIRIN) 325 MG tablet Take 325 mg by mouth daily.  . carvedilol (COREG) 12.5 MG tablet TAKE 1 TABLET(12.5 MG) BY MOUTH TWICE DAILY WITH MEALS  . Cholecalciferol (VITAMIN D-1000 MAX ST) 1000 units tablet Take 1,000 Units by mouth daily.   [name] letrozole (FEMARA) 2.5 MG tablet TAKE 1 TABLET BY MOUTH EVERY DAY  . losartan-hydrochlorothiazide (HYZAAR) 100-25 MG tablet TAKE 1 TABLET BY MOUTH DAILY  . meclizine (ANTIVERT) 25 MG tablet Take 1 tablet (25 mg total) by mouth 3 (three) times daily as needed for dizziness or nausea.  [name] omeprazole (PRILOSEC) 40 MG capsule Take by mouth.  [name] PROAIR HFA 108 (90 Base) MCG/ACT inhaler INL 2 INHALATIONS ITL Q 6 H PRF WHZ  . rosuvastatin (CRESTOR) 10 MG tablet TAKE 1 TABLET BY MOUTH EVERY DAY  . vitamin E 400 UNIT capsule Take 400 Units by mouth daily.   No facility-administered encounter medications on file as of 02/24/2018.     Review of Systems  Constitutional: Negative for appetite change and unexpected weight change.  HENT: Negative for congestion and sinus pressure.   Respiratory: Negative for cough, chest tightness and shortness of breath.   Cardiovascular: Negative for chest pain, palpitations and leg swelling.  Gastrointestinal: Positive for abdominal pain and nausea.  Negative for diarrhea and vomiting.  Genitourinary: Negative for difficulty urinating and dysuria.  Musculoskeletal: Negative for joint swelling and myalgias.  Skin: Negative for color change and rash.  Neurological: Negative for dizziness, light-headedness and headaches.  Psychiatric/Behavioral: Negative for agitation  and dysphoric mood.       Objective:    Physical Exam  Constitutional: She appears well-developed and well-nourished. No distress.  HENT:  Nose: Nose normal.  Mouth/Throat: Oropharynx is clear and moist.  Neck: Neck supple. No thyromegaly present.  Cardiovascular: Normal rate and regular rhythm.  Pulmonary/Chest: Breath sounds normal. No respiratory distress. She has no wheezes.  Abdominal: Soft. Bowel sounds are normal. There is no tenderness.  Musculoskeletal: She exhibits no edema or tenderness.  Lymphadenopathy:    She has no cervical adenopathy.  Skin: No rash noted. No erythema.  Psychiatric: She has a normal mood and affect. Her behavior is normal.    BP (!) 142/80 (BP Location: Left Arm, Patient Position: Sitting, Cuff Size: Large)   Pulse (!) 55   Temp 98 F (36.7 C) (Oral)   Resp 18   Wt 206 lb 6.4 oz (93.6 kg)   SpO2 96%   BMI 34.35 kg/m  Wt Readings from Last 3 Encounters:  02/24/18 206 lb 6.4 oz (93.6 kg)  08/26/17 206 lb (93.4 kg)  08/25/17 206 lb (93.4 kg)     Lab Results  Component Value Date   WBC 5.8 09/25/2016   HGB 13.2 09/25/2016   HCT 37.6 09/25/2016   PLT 199 09/25/2016   GLUCOSE 133 (H) 08/24/2017   CHOL 175 08/24/2017   TRIG 223.0 (H) 08/24/2017   HDL 39.10 08/24/2017   LDLDIRECT 117.0 08/24/2017   LDLCALC 64 04/15/2017   ALT 30 08/24/2017   AST 19 08/24/2017   NA 137 08/24/2017   K 4.2 08/24/2017   CL 106 08/24/2017   CREATININE 0.84 08/24/2017   BUN 22 08/24/2017   CO2 22 08/24/2017   TSH 4.12 10/19/2017   INR 0.9 07/13/2012   HGBA1C 6.8 (H) 08/24/2017   MICROALBUR 0.3 12/17/2016    Dg Bone  Density  Result Date: 10/05/2017 EXAM: DUAL X-RAY ABSORPTIOMETRY (DXA) FOR BONE MINERAL DENSITY IMPRESSION: Dear Dr Bary Castilla, Your patient Lorelee Market completed a FRAX assessment on 10/05/2017 using the [place] (analysis version: 14.10) manufactured by [employer]. The following summarizes the results of our evaluation. PATIENT BIOGRAPHICAL: Name: Inaya, Gillham Patient ID: [id number] Birth Date: [birth year] Height:    65.0 in. Gender:     Female    [age]       Weight:    206.8 lbs. Ethnicity:  White                            Exam Date: 10/05/2017 FRAX* RESULTS:  (version: 3.5) 10-year Probability of Fracture1 Major Osteoporotic Fracture2 Hip Fracture 16.3% 2.8% Population: [place] (Caucasian) Risk Factors: History of Fracture (Adult) Based on Femur (Right) Neck BMD 1 -The 10-year probability of fracture may be lower than reported if the patient has received treatment. 2 -Major Osteoporotic Fracture: Clinical Spine, Forearm, Hip or Shoulder *FRAX is a Materials engineer of the [employer] for Metabolic Bone Disease, a [place] (WHO) [facility]. ASSESSMENT: The probability of a major osteoporotic fracture is 16.3% within the next ten years. The probability of a hip fracture is 2.8% within the next ten years. . Dear Dr Bary Castilla, Your patient Mishka Stegemann completed a BMD test on 10/05/2017 using the [place] (analysis version: 14.10) manufactured by [employer]. The following summarizes the results of our evaluation. PATIENT BIOGRAPHICAL: Name: Kynzi, Levay  Patient ID: [id number] Birth Date: [birth year] Height: 65.0 in. Gender: Female Exam Date: 10/05/2017 Weight: 206.8 lbs. Indications: Advanced Age, Caucasian, COPD, Early Menopause, Height Loss, High Risk Meds, History of Breast Cancer, History of Fracture (Adult), History of Radiation, Osteoarthritis, Osteopenia, Postmenopausal Fractures: Left wrist, Left finger  Treatments: aspirin, Femara, Omeprazole, Vitamin D ASSESSMENT: The BMD measured at Femur Neck Right is 0.807 g/cm2 with a T-score of -1.7. This patient is considered osteopenic according to [facility]) criteria. Site Region Measured Measured WHO Young Adult BMD Date       Age      Classification T-score AP Spine L1-L4 10/05/2017 73.1 Normal -0.1 1.184 g/cm2 AP Spine L1-L4 02/01/2014 69.4 Normal 0.8 1.288 g/cm2 DualFemur Neck Right 10/05/2017 73.1 Osteopenia -1.7 0.807 g/cm2 DualFemur Neck Right 02/01/2014 69.4 Osteopenia -1.6 0.818 g/cm2 DualFemur Total Mean 10/05/2017 73.1 Osteopenia -1.1 0.872 g/cm2 DualFemur Total Mean 02/01/2014 69.4 Osteopenia -1.1 0.868 g/cm2 World Health Organization [facility]) criteria for post-menopausal, Caucasian Women: Normal:       T-score at or above -1 SD Osteopenia:   T-score between -1 and -2.5 SD Osteoporosis: T-score at or below -2.5 SD RECOMMENDATIONS: 1. All patients should optimize calcium and vitamin D intake. 2. Consider FDA-approved medical therapies in postmenopausal women and men aged 26 years and older, based on the following: a. A hip or vertebral(clinical or morphometric) fracture b. T-score < -2.5 at the femoral neck or spine after appropriate evaluation to exclude secondary causes c. Low bone mass (T-score between -1.0 and -2.5 at the femoral neck or spine) and a 10-year probability of a hip fracture > 3% or a 10-year probability of a major osteoporosis-related fracture > 20% based on the [place]-adapted WHO algorithm d. Clinician judgment and/or patient preferences may indicate treatment for people with 10-year fracture probabilities above or below these levels FOLLOW-UP: People with diagnosed cases of osteoporosis or at high risk for fracture should have regular bone mineral density tests. For patients eligible for Medicare, routine testing is allowed once every 2 years. The testing frequency can be increased to one year for patients who have rapidly  progressing disease, those who are receiving or discontinuing medical therapy to restore bone mass, or have additional risk factors. I have reviewed this report, and agree with the above findings. [facility] Radiology Electronically Signed   By: Ilona Sorrel M.D.   On: 10/05/2017 15:13       Assessment & Plan:   Problem List Items Addressed This Visit    Abdominal pain    Persistent abdominal pain.  Some nausea.  Had EGD and colonoscopy 05/2017.  Gastritis.  On omeprazole.  Still some persistent pain.  Check CT abdomen and pelvis.        Relevant Orders   CT Abdomen Pelvis W Contrast   Abnormal liver function test    Follow liver panel.        Relevant Orders   CT Abdomen Pelvis W Contrast   Hepatic function panel   Breast cancer ([place])    Mammogram 08/18/17 - birads II.  Seeing Dr Bary Castilla        CAD (coronary artery disease)    Followed by cardiology.  Last evaluated 10/2017.  Continue risk factor modification.        Carotid artery stenosis    Evaluated by Dr Lucky Cowboy 04/2016.  Recommended f/u in 2 years.        COPD (chronic obstructive pulmonary disease) (HCC)    Sees Dr Raul Del.  Breathing stable.  Planning for f/u HRCT 05/2018.        Relevant Medications   PROAIR HFA 108 (90 Base) MCG/ACT inhaler   Diabetes mellitus with cardiac complication (HCC)    Low carb diet and exercise.  Follow met b and a1c.        Relevant Orders   Hemoglobin A1c   Microalbumin / creatinine urine ratio   GERD (gastroesophageal reflux disease)    Controlled on current regimen.  Follow.        Hypercholesterolemia    On crestor.  Low cholesterol diet and exercise.  Follow lipid panel and liver function tests.        Relevant Orders   Lipid panel   Hypertension    Blood pressure has been under better control.  Elevated today - slight elevation.  Have her spot check her pressure.  Get her back in soon to reassess.        Relevant Orders   CBC with Differential/Platelet   Basic metabolic  panel   Peripheral vascular disease ([place])    Evaluated by Dr Lucky Cowboy 04/2016.  Normal ABIs.  Recommended f/u in 2 years.        Thyroid nodule    Evaluated by Dr Gabriel Carina.  Biopsy negative.  Follow tsh.            Einar Pheasant, MD

## 2018-02-27 ENCOUNTER — Encounter: Payer: Self-pay | Admitting: Internal Medicine

## 2018-02-27 DIAGNOSIS — R109 Unspecified abdominal pain: Secondary | ICD-10-CM | POA: Insufficient documentation

## 2018-02-27 DIAGNOSIS — G4733 Obstructive sleep apnea (adult) (pediatric): Secondary | ICD-10-CM | POA: Diagnosis not present

## 2018-02-27 NOTE — Assessment & Plan Note (Signed)
Evaluated by Dr Lucky Cowboy 04/2016.  Recommended f/u in 2 years.

## 2018-02-27 NOTE — Assessment & Plan Note (Signed)
Low carb diet and exercise.  Follow met b and a1c.   

## 2018-02-27 NOTE — Assessment & Plan Note (Signed)
Sees Dr Raul Del.  Breathing stable.  Planning for f/u HRCT 05/2018.

## 2018-02-27 NOTE — Assessment & Plan Note (Addendum)
Persistent abdominal pain.  Some nausea.  Had EGD and colonoscopy 05/2017.  Gastritis.  On omeprazole.  Still some persistent pain.  Check CT abdomen and pelvis.

## 2018-02-27 NOTE — Assessment & Plan Note (Signed)
Evaluated by Dr Solum.  Biopsy negative.  Follow tsh.   

## 2018-02-27 NOTE — Assessment & Plan Note (Signed)
On crestor.  Low cholesterol diet and exercise.  Follow lipid panel and liver function tests.   

## 2018-02-27 NOTE — Assessment & Plan Note (Signed)
Evaluated by Dr Lucky Cowboy 04/2016.  Normal ABIs.  Recommended f/u in 2 years.

## 2018-02-27 NOTE — Assessment & Plan Note (Signed)
Blood pressure has been under better control.  Elevated today - slight elevation.  Have her spot check her pressure.  Get her back in soon to reassess.

## 2018-02-27 NOTE — Assessment & Plan Note (Signed)
Followed by cardiology.  Last evaluated 10/2017.  Continue risk factor modification.

## 2018-02-27 NOTE — Assessment & Plan Note (Addendum)
Mammogram 08/18/17 - birads II.  Seeing Dr Bary Castilla

## 2018-02-27 NOTE — Assessment & Plan Note (Signed)
Controlled on current regimen.  Follow.  

## 2018-02-27 NOTE — Assessment & Plan Note (Signed)
Follow liver panel.  

## 2018-03-01 DIAGNOSIS — R221 Localized swelling, mass and lump, neck: Secondary | ICD-10-CM | POA: Diagnosis not present

## 2018-03-01 DIAGNOSIS — H6122 Impacted cerumen, left ear: Secondary | ICD-10-CM | POA: Diagnosis not present

## 2018-03-02 DIAGNOSIS — R0609 Other forms of dyspnea: Secondary | ICD-10-CM | POA: Diagnosis not present

## 2018-03-02 DIAGNOSIS — G4733 Obstructive sleep apnea (adult) (pediatric): Secondary | ICD-10-CM | POA: Diagnosis not present

## 2018-03-02 DIAGNOSIS — J849 Interstitial pulmonary disease, unspecified: Secondary | ICD-10-CM | POA: Diagnosis not present

## 2018-03-02 DIAGNOSIS — J449 Chronic obstructive pulmonary disease, unspecified: Secondary | ICD-10-CM | POA: Diagnosis not present

## 2018-03-03 ENCOUNTER — Other Ambulatory Visit (INDEPENDENT_AMBULATORY_CARE_PROVIDER_SITE_OTHER): Payer: Medicare Other

## 2018-03-03 DIAGNOSIS — E7800 Pure hypercholesterolemia, unspecified: Secondary | ICD-10-CM | POA: Diagnosis not present

## 2018-03-03 DIAGNOSIS — R7989 Other specified abnormal findings of blood chemistry: Secondary | ICD-10-CM

## 2018-03-03 DIAGNOSIS — R945 Abnormal results of liver function studies: Secondary | ICD-10-CM | POA: Diagnosis not present

## 2018-03-03 DIAGNOSIS — E1159 Type 2 diabetes mellitus with other circulatory complications: Secondary | ICD-10-CM

## 2018-03-03 DIAGNOSIS — I10 Essential (primary) hypertension: Secondary | ICD-10-CM

## 2018-03-03 LAB — CBC WITH DIFFERENTIAL/PLATELET
BASOS PCT: 0.5 % (ref 0.0–3.0)
Basophils Absolute: 0 10*3/uL (ref 0.0–0.1)
Eosinophils Absolute: 0.1 10*3/uL (ref 0.0–0.7)
Eosinophils Relative: 2.5 % (ref 0.0–5.0)
HEMATOCRIT: 36.3 % (ref 36.0–46.0)
HEMOGLOBIN: 12.9 g/dL (ref 12.0–15.0)
LYMPHS PCT: 30.7 % (ref 12.0–46.0)
Lymphs Abs: 1.8 10*3/uL (ref 0.7–4.0)
MCHC: 35.6 g/dL (ref 30.0–36.0)
MCV: 92.7 fl (ref 78.0–100.0)
MONOS PCT: 8.7 % (ref 3.0–12.0)
Monocytes Absolute: 0.5 10*3/uL (ref 0.1–1.0)
NEUTROS ABS: 3.3 10*3/uL (ref 1.4–7.7)
Neutrophils Relative %: 57.6 % (ref 43.0–77.0)
PLATELETS: 192 10*3/uL (ref 150.0–400.0)
RBC: 3.91 Mil/uL (ref 3.87–5.11)
RDW: 13.4 % (ref 11.5–15.5)
WBC: 5.8 10*3/uL (ref 4.0–10.5)

## 2018-03-03 LAB — BASIC METABOLIC PANEL
BUN: 18 mg/dL (ref 6–23)
CHLORIDE: 103 meq/L (ref 96–112)
CO2: 27 meq/L (ref 19–32)
CREATININE: 0.95 mg/dL (ref 0.40–1.20)
Calcium: 9.6 mg/dL (ref 8.4–10.5)
GFR: 61.19 mL/min (ref >60.00)
Glucose, Bld: 138 mg/dL — ABNORMAL HIGH (ref 70–99)
Potassium: 4.3 mEq/L (ref 3.5–5.1)
Sodium: 139 mEq/L (ref 135–145)

## 2018-03-03 LAB — MICROALBUMIN / CREATININE URINE RATIO
Creatinine,U: 70 mg/dL
Microalb Creat Ratio: 1 mg/g (ref 0.0–30.0)
Microalb, Ur: <0.7 mg/dL (ref 0.0–1.9)

## 2018-03-03 LAB — LIPID PANEL
CHOL/HDL RATIO: 4
Cholesterol: 159 mg/dL (ref 0–200)
HDL: 40.4 mg/dL (ref >39.00)
NONHDL: 118.42
Triglycerides: 206 mg/dL — ABNORMAL HIGH (ref 0.0–149.0)
VLDL: 41.2 mg/dL — ABNORMAL HIGH (ref 0.0–40.0)

## 2018-03-03 LAB — HEPATIC FUNCTION PANEL
ALK PHOS: 69 U/L (ref 39–117)
ALT: 36 U/L — ABNORMAL HIGH (ref 0–35)
AST: 21 U/L (ref 0–37)
Albumin: 4.4 g/dL (ref 3.5–5.2)
BILIRUBIN DIRECT: 0.1 mg/dL (ref 0.0–0.3)
Total Bilirubin: 0.5 mg/dL (ref 0.2–1.2)
Total Protein: 7.5 g/dL (ref 6.0–8.3)

## 2018-03-03 LAB — HEMOGLOBIN A1C: Hgb A1c MFr Bld: 6.9 % — ABNORMAL HIGH (ref 4.6–6.5)

## 2018-03-03 LAB — LDL CHOLESTEROL, DIRECT: Direct LDL: 108 mg/dL

## 2018-03-07 ENCOUNTER — Other Ambulatory Visit: Payer: Self-pay | Admitting: Internal Medicine

## 2018-03-07 DIAGNOSIS — R7989 Other specified abnormal findings of blood chemistry: Secondary | ICD-10-CM

## 2018-03-07 DIAGNOSIS — R945 Abnormal results of liver function studies: Secondary | ICD-10-CM

## 2018-03-07 NOTE — Progress Notes (Signed)
Order placed for f/u liver panel.  

## 2018-03-08 ENCOUNTER — Ambulatory Visit
Admission: RE | Admit: 2018-03-08 | Discharge: 2018-03-08 | Disposition: A | Discharge status: Home / Self Care | Payer: Medicare Other | Source: Ambulatory Visit | Attending: Internal Medicine | Admitting: Internal Medicine

## 2018-03-08 DIAGNOSIS — R140 Abdominal distension (gaseous): Secondary | ICD-10-CM | POA: Diagnosis not present

## 2018-03-08 DIAGNOSIS — R1084 Generalized abdominal pain: Secondary | ICD-10-CM | POA: Diagnosis present

## 2018-03-08 DIAGNOSIS — I700 Atherosclerosis of aorta: Secondary | ICD-10-CM | POA: Insufficient documentation

## 2018-03-08 DIAGNOSIS — R945 Abnormal results of liver function studies: Secondary | ICD-10-CM | POA: Diagnosis not present

## 2018-03-08 DIAGNOSIS — K760 Fatty (change of) liver, not elsewhere classified: Secondary | ICD-10-CM | POA: Insufficient documentation

## 2018-03-08 DIAGNOSIS — R7989 Other specified abnormal findings of blood chemistry: Secondary | ICD-10-CM

## 2018-03-08 HISTORY — DX: Heart failure, unspecified: I50.9

## 2018-03-08 HISTORY — DX: Malignant neoplasm of unspecified site of left female breast: C50.912

## 2018-03-08 MED ORDER — IOPAMIDOL (ISOVUE-300) INJECTION 61%
100.0000 mL | Freq: Once | INTRAVENOUS | Status: AC | PRN
  Administered 2018-03-08: 100 mL via INTRAVENOUS

## 2018-03-09 ENCOUNTER — Telehealth: Payer: Self-pay | Admitting: Internal Medicine

## 2018-03-09 ENCOUNTER — Other Ambulatory Visit: Payer: Self-pay | Admitting: Internal Medicine

## 2018-03-09 NOTE — Telephone Encounter (Signed)
Copied from [place] [phone]. Topic: General - Other >> Mar 09, 2018  4:38 PM Yvette Rack wrote: Reason for CRM: Pt states she called Dr. Barbette Hair office to schedule an appt and she was advised that they could not see her until 03/29/18. Pt states she was told to call her pcp to see if it was ok for her to wait until 03/29/18. Cb# [phone]

## 2018-03-10 ENCOUNTER — Other Ambulatory Visit: Payer: Self-pay | Admitting: Internal Medicine

## 2018-03-10 DIAGNOSIS — R109 Unspecified abdominal pain: Secondary | ICD-10-CM

## 2018-03-10 DIAGNOSIS — K760 Fatty (change of) liver, not elsewhere classified: Secondary | ICD-10-CM

## 2018-03-10 DIAGNOSIS — R140 Abdominal distension (gaseous): Secondary | ICD-10-CM

## 2018-03-10 NOTE — Telephone Encounter (Signed)
Not emergent, but can you see if they can see him before 03/29/18.  Thanks

## 2018-03-10 NOTE — Telephone Encounter (Signed)
Ok for patient to wait until 03/29/18 to see Dr. Bary Castilla?

## 2018-03-10 NOTE — Progress Notes (Signed)
Order placed for GI referral.   

## 2018-03-11 NOTE — Telephone Encounter (Signed)
Noted.  Please let pt know they will call if get a cancellation.  Thanks.

## 2018-03-11 NOTE — Telephone Encounter (Signed)
Left detailed message.   

## 2018-03-11 NOTE — Telephone Encounter (Signed)
Called [facility]. Dr. Bary Castilla is booked and does not have any cancellations as of yet for her to be seen earlier. Thx, melissa

## 2018-03-17 DIAGNOSIS — R1013 Epigastric pain: Secondary | ICD-10-CM | POA: Diagnosis not present

## 2018-03-17 DIAGNOSIS — K760 Fatty (change of) liver, not elsewhere classified: Secondary | ICD-10-CM | POA: Diagnosis not present

## 2018-03-22 ENCOUNTER — Other Ambulatory Visit (INDEPENDENT_AMBULATORY_CARE_PROVIDER_SITE_OTHER): Payer: Medicare Other

## 2018-03-22 DIAGNOSIS — R945 Abnormal results of liver function studies: Secondary | ICD-10-CM | POA: Diagnosis not present

## 2018-03-22 DIAGNOSIS — R7989 Other specified abnormal findings of blood chemistry: Secondary | ICD-10-CM

## 2018-03-22 DIAGNOSIS — K760 Fatty (change of) liver, not elsewhere classified: Secondary | ICD-10-CM

## 2018-03-22 LAB — HEPATIC FUNCTION PANEL
ALBUMIN: 4.4 g/dL (ref 3.5–5.2)
ALT: 28 U/L (ref 0–35)
AST: 20 U/L (ref 0–37)
Alkaline Phosphatase: 67 U/L (ref 39–117)
BILIRUBIN DIRECT: 0.1 mg/dL (ref 0.0–0.3)
Total Bilirubin: 0.6 mg/dL (ref 0.2–1.2)
Total Protein: 7 g/dL (ref 6.0–8.3)

## 2018-03-22 NOTE — Addendum Note (Signed)
Addended by: Leeanne Rio on: 03/22/2018 02:45 PM   Modules accepted: Orders

## 2018-03-22 NOTE — Addendum Note (Signed)
Addended by: Leeanne Rio on: 03/22/2018 02:46 PM   Modules accepted: Orders

## 2018-03-24 LAB — HAV/HBV (PROFILE VII)
HEP B C TOTAL AB: NEGATIVE
HEP B E AB: NEGATIVE
HEP B S AG: NEGATIVE
Hep A IgM: NEGATIVE
Hep A Total Ab: NEGATIVE
Hep B C IgM: NEGATIVE
Hep B E Ag: NEGATIVE
Hep B Surface Ab, Qual: NONREACTIVE

## 2018-03-24 LAB — HEPATITIS C ANTIBODY: Hep C Virus Ab: <0.1 s/co ratio (ref 0.0–0.9)

## 2018-03-29 ENCOUNTER — Encounter: Payer: Self-pay | Admitting: General Surgery

## 2018-03-29 ENCOUNTER — Other Ambulatory Visit: Payer: Self-pay

## 2018-03-29 ENCOUNTER — Ambulatory Visit: Payer: Medicare Other | Admitting: General Surgery

## 2018-03-29 VITALS — BP 153/73 | HR 66 | Temp 97.7°F | Resp 18 | Ht 64.5 in | Wt 207.0 lb

## 2018-03-29 DIAGNOSIS — Z170 Estrogen receptor positive status [ER+]: Secondary | ICD-10-CM

## 2018-03-29 DIAGNOSIS — C50512 Malignant neoplasm of lower-outer quadrant of left female breast: Secondary | ICD-10-CM | POA: Diagnosis not present

## 2018-03-29 DIAGNOSIS — N6489 Other specified disorders of breast: Secondary | ICD-10-CM | POA: Diagnosis not present

## 2018-03-29 NOTE — Progress Notes (Signed)
[name], female   DOB: 06/28/1945, 72 y.o.   MRN: [id number]  Chief Complaint  Patient presents with  . Follow-up    Left breast mass on CT    HPI Lindsey Bentley is a 72 y.o. female.  Here today for evaluation of left breast mass found on recent CT scan. Patient is able to palpate firm mass. Denies nipple drainage or fever. No chills. Patient is having left upper quadrant pain. She states she has been having some left chest discomfort " for a long time", she had a endoscopy and has been using Prilosec. She states the pain is random and shooting, not related to meals.  HPI  Past Medical History:  Diagnosis Date  . Arthritis   . Breast cancer, left ([place]) 2016   LT LUMPECTOMY - TI, NO, MO - IDC, ER/PR pos, Her 2 neg, Rad tx's.   . Carotid artery occlusion   . Carotid artery occlusion   . 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 CHF (congestive heart failure) ([place])   . Coronary artery disease   . Depression   . Diffuse cystic mastopathy 2013  . DVT (deep venous thrombosis) ([place])   . Dyspnea   . Elevated TSH   . Family history of adverse reaction to anesthesia    Pt stated that son had a seizure with a combination of anesthesia and pain medication."  . GERD (gastroesophageal reflux disease)   . Glaucoma   . High cholesterol   . History of chicken pox   . Hyperglycemia   . Hyperlipidemia   . Hypertension   . LVH (left ventricular hypertrophy)   . PAC (premature atrial contraction)   . Palpitations   . Peripheral vascular disease ([place])   . Personal history of radiation therapy 2016   BREAST CA  . Pneumonia March 2014  . Skin cancer 2013  . Sleep apnea    wears CPAP set at 2.5  . Wears glasses     Past Surgical History:  Procedure Laterality Date  . BACK SURGERY  1986   ruptured disc  . BREAST BIOPSY Left 2008   NEG  . BREAST BIOPSY Left 08-20-14   POS  . BREAST BIOPSY Right 2007   NEG  . BREAST EXCISIONAL BIOPSY Left  1984   NEG  . 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 BREAST SURGERY Left 09/05/2014   `T1a,N0; 5 mm  ER/PR positive. HER2 negative.  Wide excision with SLN biopsy  . CARPAL TUNNEL RELEASE    . CHOLECYSTECTOMY  2006  . COLONOSCOPY  2010   Dr. Jamal Collin  . COLONOSCOPY WITH PROPOFOL N/A 05/17/2017   Procedure: COLONOSCOPY WITH PROPOFOL;  Surgeon: Manya Silvas, MD;  Location: [facility] ENDOSCOPY;  Service: Endoscopy;  Laterality: N/A;  . DILATION AND CURETTAGE OF UTERUS    . ESOPHAGOGASTRODUODENOSCOPY (EGD) WITH PROPOFOL N/A 05/17/2017   Procedure: ESOPHAGOGASTRODUODENOSCOPY (EGD) WITH PROPOFOL;  Surgeon: Manya Silvas, MD;  Location: [facility] ENDOSCOPY;  Service: Endoscopy;  Laterality: N/A;  . LAPAROTOMY FOR REMOVAL TUMOR LUMBAR PLEXES    . Leg stent  2011  . MOLE REMOVAL  2013   15 removed  . POSTERIOR CERVICAL LAMINECTOMY Left 10/15/2015   Procedure: Left Cervical four- five Hemilaminectomy/Remove mass;  Surgeon: Leeroy Cha, MD;  Location: [facility] NEURO ORS;  Service: Neurosurgery;  Laterality: Left;  Left C4-5 Hemilaminectomy/Remove mass    Family History  Problem Relation Age of Onset  .  Cancer Father        Prostate  . Diabetes Father   . Cerebrovascular Accident Father   . Hypertension Mother   . AAA (abdominal aortic aneurysm) Mother   . Hyperlipidemia Other        Parent  . Miscarriages / Stillbirths Other        Parent  . Hypertension Other        parent  . Heart disease Other        Parent  . Breast cancer Maternal Aunt 72    Social History Social History   Tobacco Use  . Smoking status: Never Smoker  . Smokeless tobacco: Never Used  Substance Use Topics  . Alcohol use: No    Alcohol/week: 0.0 standard drinks  . Drug use: No    Allergies  Allergen Reactions  . Codeine Nausea And Vomiting  . Prednisone Swelling  . Statins Other (See Comments)    Muscle Pain, can take Crestor  . Tape Itching and Rash    Please use "paper" tape Please use "paper" tape     Current Outpatient Medications  Medication Sig Dispense Refill  . aspirin (BAYER ASPIRIN) 325 MG tablet Take 325 mg by mouth daily.    . carvedilol (COREG) 12.5 MG tablet TAKE 1 TABLET(12.5 MG) BY MOUTH TWICE DAILY WITH MEALS    . Cholecalciferol (VITAMIN D-1000 MAX ST) 1000 units tablet Take 1,000 Units by mouth daily.     [name] letrozole (FEMARA) 2.5 MG tablet TAKE 1 TABLET BY MOUTH EVERY DAY 90 tablet 4  . losartan-hydrochlorothiazide (HYZAAR) 100-25 MG tablet TAKE 1 TABLET BY MOUTH DAILY 90 tablet 0  . meclizine (ANTIVERT) 25 MG tablet Take 1 tablet (25 mg total) by mouth 3 (three) times daily as needed for dizziness or nausea. 20 tablet 0  . omeprazole (PRILOSEC) 40 MG capsule Take by mouth.    [name] PROAIR HFA 108 (90 Base) MCG/ACT inhaler INL 2 INHALATIONS ITL Q 6 H PRF WHZ  1  . rosuvastatin (CRESTOR) 10 MG tablet TAKE 1 TABLET BY MOUTH EVERY DAY 90 tablet 0  . sucralfate (CARAFATE) 1 g tablet Take by mouth.    . vitamin E 400 UNIT capsule Take 400 Units by mouth daily.     No current facility-administered medications for this visit.     Review of Systems Review of Systems  Constitutional: Negative.   Respiratory: Negative.   Cardiovascular: Negative.   Skin: Negative.     Blood pressure (!) 153/73, pulse 66, temperature 97.7 F (36.5 C), temperature source Temporal, resp. rate 18, height 5' 4.5" (1.638 m), weight 207 lb (93.9 kg), SpO2 96 %.  Physical Exam Physical Exam  Constitutional: She is oriented to person, place, and time. She appears well-developed and well-nourished.  Eyes: Conjunctivae are normal. No scleral icterus.  Neck: Normal range of motion.  Cardiovascular: Normal rate, regular rhythm and normal heart sounds.  Pulmonary/Chest: Effort normal and breath sounds normal. Left breast exhibits no inverted nipple, no mass, no nipple discharge, no skin change and no tenderness.  Dense scarring lower outer left breast, unchanged.    Neurological: She is alert and  oriented to person, place, and time.  Skin: Skin is warm and dry.  Psychiatric: Her behavior is normal.    Data Reviewed March 08, 2018 CT of the abdomen and pelvis obtained for fullness and left upper quadrant discomfort: IMPRESSION: 1. No explanation for the patient's abdominal pain and bloating is seen. 2. Probable seroma within the  left breast with scarring from prior lumpectomy. 3. Hepatic steatosis. 4. Moderate abdominal aortic atherosclerosis.  Assessment    Benign breast exam.  Postsurgical/radiation changes appreciated on recent CT of the abdomen and pelvis.    Plan    Follow up as scheduled.      HPI, Physical Exam, Assessment and Plan have been scribed under the direction and in the presence of Robert Bellow, MD. Karie Fetch, RN  I have completed the exam and reviewed the above documentation for accuracy and completeness.  I agree with the above.  Haematologist has been used and any errors in dictation or transcription are unintentional.  Hervey Ard, M.D., F.A.C.S.  Forest Gleason Georganna Maxson 03/30/2018, 6:18 PM

## 2018-03-29 NOTE — Patient Instructions (Signed)
The patient is aware to call back for any questions or concerns.  

## 2018-03-30 ENCOUNTER — Encounter: Payer: Self-pay | Admitting: General Surgery

## 2018-03-30 DIAGNOSIS — N6489 Other specified disorders of breast: Secondary | ICD-10-CM | POA: Insufficient documentation

## 2018-04-11 ENCOUNTER — Other Ambulatory Visit: Payer: Self-pay

## 2018-04-11 ENCOUNTER — Telehealth: Payer: Self-pay | Admitting: Internal Medicine

## 2018-04-11 MED ORDER — ROSUVASTATIN CALCIUM 20 MG PO TABS: 20.0000 mg | ORAL_TABLET | Freq: Every day | ORAL | 1 refills | Status: DC

## 2018-04-11 NOTE — Telephone Encounter (Signed)
New dose of crestor sent in. Patient aware

## 2018-04-11 NOTE — Telephone Encounter (Signed)
Please advise 

## 2018-04-11 NOTE — Telephone Encounter (Signed)
Copied from [place] [id number]. Topic: Quick Communication - See Telephone Encounter >> Apr 11, 2018  8:29 AM Cecelia Byars, NT wrote: CRM for notification. See Telephone encounter for: 04/11/18. Patient states she was told to all the practice whe she was finished taking the rosuvastatin (CRESTOR) 10 MG tablet ,  she says she is taking her last 2 today  , please advise

## 2018-04-15 ENCOUNTER — Telehealth: Payer: Self-pay | Admitting: *Deleted

## 2018-04-15 ENCOUNTER — Ambulatory Visit (INDEPENDENT_AMBULATORY_CARE_PROVIDER_SITE_OTHER): Payer: Medicare Other | Admitting: Vascular Surgery

## 2018-04-15 ENCOUNTER — Encounter (INDEPENDENT_AMBULATORY_CARE_PROVIDER_SITE_OTHER): Payer: Medicare Other

## 2018-04-15 NOTE — Telephone Encounter (Signed)
Copied from [place] [phone]. Topic: Appointment Scheduling - Scheduling Inquiry for Clinic >> Apr 15, 2018  8:42 AM Burchel, Abbi R wrote: Reason for CRM:   Pt is requesting an OV for multiple issues, she will likely need a 30 min appointment.  Please call pt to schedule.   [phone]

## 2018-04-15 NOTE — Telephone Encounter (Signed)
Patient said last doctor she saw did a CT scan and was DX with fatty liver , and this doctor has ordered some blood work that she needs the hep A/B vaccine and she would like to discuss this with PCP. Patient scheduled for 04/19/18

## 2018-04-19 ENCOUNTER — Ambulatory Visit (INDEPENDENT_AMBULATORY_CARE_PROVIDER_SITE_OTHER): Payer: Medicare Other | Admitting: Internal Medicine

## 2018-04-19 ENCOUNTER — Encounter: Payer: Self-pay | Admitting: Internal Medicine

## 2018-04-19 DIAGNOSIS — C50912 Malignant neoplasm of unspecified site of left female breast: Secondary | ICD-10-CM | POA: Diagnosis not present

## 2018-04-19 DIAGNOSIS — R945 Abnormal results of liver function studies: Secondary | ICD-10-CM

## 2018-04-19 DIAGNOSIS — R7989 Other specified abnormal findings of blood chemistry: Secondary | ICD-10-CM

## 2018-04-19 DIAGNOSIS — I25119 Atherosclerotic heart disease of native coronary artery with unspecified angina pectoris: Secondary | ICD-10-CM | POA: Diagnosis not present

## 2018-04-19 DIAGNOSIS — J449 Chronic obstructive pulmonary disease, unspecified: Secondary | ICD-10-CM | POA: Diagnosis not present

## 2018-04-19 DIAGNOSIS — I10 Essential (primary) hypertension: Secondary | ICD-10-CM

## 2018-04-19 DIAGNOSIS — R109 Unspecified abdominal pain: Secondary | ICD-10-CM

## 2018-04-19 DIAGNOSIS — E7800 Pure hypercholesterolemia, unspecified: Secondary | ICD-10-CM

## 2018-04-19 DIAGNOSIS — E1159 Type 2 diabetes mellitus with other circulatory complications: Secondary | ICD-10-CM

## 2018-04-19 DIAGNOSIS — K219 Gastro-esophageal reflux disease without esophagitis: Secondary | ICD-10-CM

## 2018-04-19 NOTE — Progress Notes (Signed)
[name], female   DOB: [birth year], 72 y.o.   MRN: [id number]   Subjective:    [name], female    DOB: 09/02/1945, 72 y.o.   MRN: [id number]  HPI  Patient here as a work in to discuss her CT results.  CT revealed fatty liver.  Saw GI.  Recommended continuing omeprazole and added carafate.  Recommended continuing f/u labs in 6 months.  Recommended f/u ultrasound/MRI in 12 months.  Recommend twinrx injections.  She had questions about these injections.  Discussed diet, exercise and weight loss.     Past Medical History:  Diagnosis Date  . Arthritis   . Breast cancer, left ([place]) 2016   LT LUMPECTOMY - TI, NO, MO - IDC, ER/PR pos, Her 2 neg, Rad tx's.   . Carotid artery occlusion   . Carotid artery occlusion   . 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 CHF (congestive heart failure) ([place])   . COPD (chronic obstructive pulmonary disease) ([place])   . Coronary artery disease   . Depression   . Diffuse cystic mastopathy 2013  . DVT (deep venous thrombosis) ([place])   . Dyspnea   . Elevated TSH   . Family history of adverse reaction to anesthesia    Pt stated that son had a seizure with a combination of anesthesia and pain medication."  . Fatty liver   . GERD (gastroesophageal reflux disease)   . Glaucoma   . High cholesterol   . History of chicken pox   . Hyperglycemia   . Hyperlipidemia   . Hypertension   . LVH (left ventricular hypertrophy)   . PAC (premature atrial contraction)   . Palpitations   . Peripheral vascular disease ([place])   . Personal history of radiation therapy 2016   BREAST CA  . Pneumonia March 2014  . Skin cancer 2013  . Sleep apnea    wears CPAP set at 2.5  . Wears glasses    Past Surgical History:  Procedure Laterality Date  . BACK SURGERY  1986   ruptured disc  . BREAST BIOPSY Left 2008   NEG  . BREAST BIOPSY Left 08-20-14   POS  . BREAST BIOPSY Right 2007   NEG  . BREAST EXCISIONAL BIOPSY  Left 1984   NEG  . 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 BREAST SURGERY Left 09/05/2014   T1a,N0; 5 mm  ER/PR positive. HER2 negative.  Wide excision with SLN biopsy.  MammoSite radiation  . CARPAL TUNNEL RELEASE    . CHOLECYSTECTOMY  2006  . COLONOSCOPY  2010   Dr. Jamal Collin  . COLONOSCOPY WITH PROPOFOL N/A 05/17/2017   Procedure: COLONOSCOPY WITH PROPOFOL;  Surgeon: Manya Silvas, MD;  Location: [facility] ENDOSCOPY;  Service: Endoscopy;  Laterality: N/A;  . DILATION AND CURETTAGE OF UTERUS    . ESOPHAGOGASTRODUODENOSCOPY (EGD) WITH PROPOFOL N/A 05/17/2017   Procedure: ESOPHAGOGASTRODUODENOSCOPY (EGD) WITH PROPOFOL;  Surgeon: Manya Silvas, MD;  Location: [facility] ENDOSCOPY;  Service: Endoscopy;  Laterality: N/A;  . LAPAROTOMY FOR REMOVAL TUMOR LUMBAR PLEXES    . Leg stent  2011  . MOLE REMOVAL  2013   15 removed  . POSTERIOR CERVICAL LAMINECTOMY Left 10/15/2015   Procedure: Left Cervical four- five Hemilaminectomy/Remove mass;  Surgeon: Leeroy Cha, MD;  Location: [facility] NEURO ORS;  Service: Neurosurgery;  Laterality: Left;  Left C4-5 Hemilaminectomy/Remove mass   Family History  Problem Relation Age of Onset  .  Cancer Father        Prostate  . Diabetes Father   . Cerebrovascular Accident Father   . Hypertension Mother   . AAA (abdominal aortic aneurysm) Mother   . Hyperlipidemia Other        Parent  . Miscarriages / Stillbirths Other        Parent  . Hypertension Other        parent  . Heart disease Other        Parent  . Breast cancer Maternal Aunt 72   Social History   Socioeconomic History  . Marital status: Married    Spouse name: Not on file  . Number of children: 3  . Years of education: 60  . Highest education level: Not on file  Occupational History  . Occupation: Caregiver   Social Needs  . Financial resource strain: Not on file  . Food insecurity:    Worry: Not on file    Inability: Not on file  . Transportation needs:    Medical: Not on file     Non-medical: Not on file  Tobacco Use  . Smoking status: Never Smoker  . Smokeless tobacco: Never Used  Substance and Sexual Activity  . Alcohol use: No    Alcohol/week: 0.0 standard drinks  . Drug use: No  . Sexual activity: Never  Lifestyle  . Physical activity:    Days per week: Not on file    Minutes per session: Not on file  . Stress: Not on file  Relationships  . Social connections:    Talks on phone: Not on file    Gets together: Not on file    Attends religious service: Not on file    Active member of club or organization: Not on file    Attends meetings of clubs or organizations: Not on file    Relationship status: Not on file  Other Topics Concern  . Not on file  Social History Narrative   Regular exercise-mo   Caffeine Use-no    Outpatient Encounter Medications as of 04/19/2018  Medication Sig  . aspirin (BAYER ASPIRIN) 325 MG tablet Take 325 mg by mouth daily.  . carvedilol (COREG) 12.5 MG tablet TAKE 1 TABLET(12.5 MG) BY MOUTH TWICE DAILY WITH MEALS  . Cholecalciferol (VITAMIN D-1000 MAX ST) 1000 units tablet Take 1,000 Units by mouth daily.   [name] letrozole (FEMARA) 2.5 MG tablet TAKE 1 TABLET BY MOUTH EVERY DAY  . losartan-hydrochlorothiazide (HYZAAR) 100-25 MG tablet TAKE 1 TABLET BY MOUTH DAILY  . meclizine (ANTIVERT) 25 MG tablet Take 1 tablet (25 mg total) by mouth 3 (three) times daily as needed for dizziness or nausea.  [name] omeprazole (PRILOSEC) 40 MG capsule Take by mouth.  [name] PROAIR HFA 108 (90 Base) MCG/ACT inhaler INL 2 INHALATIONS ITL Q 6 H PRF WHZ  . rosuvastatin (CRESTOR) 20 MG tablet Take 1 tablet (20 mg total) by mouth daily.  . vitamin E 400 UNIT capsule Take 400 Units by mouth daily.  . [DISCONTINUED] sucralfate (CARAFATE) 1 g tablet Take by mouth.   No facility-administered encounter medications on file as of 04/19/2018.     Review of Systems  Constitutional: Negative for appetite change and unexpected weight change.  HENT: Negative for  congestion and sinus pressure.   Respiratory: Negative for cough, chest tightness and shortness of breath.   Cardiovascular: Negative for chest pain, palpitations and leg swelling.  Gastrointestinal: Negative for abdominal pain, diarrhea, nausea and vomiting.  Genitourinary: Negative for  difficulty urinating and dysuria.  Musculoskeletal: Negative for joint swelling and myalgias.  Skin: Negative for color change and rash.  Neurological: Negative for dizziness, light-headedness and headaches.  Psychiatric/Behavioral: Negative for agitation and dysphoric mood.       Objective:    Physical Exam Constitutional:      General: She is not in acute distress.    Appearance: Normal appearance.  HENT:     Nose: Nose normal. No congestion.     Mouth/Throat:     Pharynx: No oropharyngeal exudate or posterior oropharyngeal erythema.  Neck:     Musculoskeletal: Neck supple. No muscular tenderness.     Thyroid: No thyromegaly.  Cardiovascular:     Rate and Rhythm: Normal rate and regular rhythm.  Pulmonary:     Effort: No respiratory distress.     Breath sounds: Normal breath sounds. No wheezing.  Abdominal:     General: Bowel sounds are normal.     Palpations: Abdomen is soft.     Tenderness: There is no abdominal tenderness.  Musculoskeletal:        General: No swelling or tenderness.  Lymphadenopathy:     Cervical: No cervical adenopathy.  Skin:    Findings: No erythema or rash.  Neurological:     Mental Status: She is alert.  Psychiatric:        Mood and Affect: Mood normal.        Behavior: Behavior normal.     BP 140/80 (BP Location: Right Arm, Patient Position: Sitting, Cuff Size: Normal)   Pulse 62   Temp 98.2 F (36.8 C) (Oral)   Resp 18   Wt 208 lb 9.6 oz (94.6 kg)   SpO2 98%   BMI 35.25 kg/m  Wt Readings from Last 3 Encounters:  04/22/18 205 lb 9.6 oz (93.3 kg)  04/19/18 208 lb 9.6 oz (94.6 kg)  03/29/18 207 lb (93.9 kg)     Lab Results  Component Value  Date   WBC 5.8 03/03/2018   HGB 12.9 03/03/2018   HCT 36.3 03/03/2018   PLT 192.0 03/03/2018   GLUCOSE 138 (H) 03/03/2018   CHOL 159 03/03/2018   TRIG 206.0 (H) 03/03/2018   HDL 40.40 03/03/2018   LDLDIRECT 108.0 03/03/2018   LDLCALC 64 04/15/2017   ALT 28 03/22/2018   AST 20 03/22/2018   NA 139 03/03/2018   K 4.3 03/03/2018   CL 103 03/03/2018   CREATININE 0.95 03/03/2018   BUN 18 03/03/2018   CO2 27 03/03/2018   TSH 4.12 10/19/2017   INR 0.9 07/13/2012   HGBA1C 6.9 (H) 03/03/2018   MICROALBUR <0.7 03/03/2018    Ct Abdomen Pelvis W Contrast  Result Date: 03/08/2018 CLINICAL DATA:  Left upper quadrant soreness, abdominal bloating over the last year, intermittent nausea EXAM: CT ABDOMEN AND PELVIS WITH CONTRAST TECHNIQUE: Multidetector CT imaging of the abdomen and pelvis was performed using the standard protocol following bolus administration of intravenous contrast. CONTRAST:  181m ISOVUE-300 IOPAMIDOL (ISOVUE-300) INJECTION 61% COMPARISON:  CT abdomen pelvis of 09/25/2016 FINDINGS: Lower chest: The lung bases are clear. A low-attenuation structure has increased slightly in size within the deep soft tissues of the left breast laterally of 3.6 x 2.5 cm compared to prior measurement of 2.6 cm. Consistent with seroma and scarring from prior lumpectomy by history. Hepatobiliary: The liver is very low in attenuation consistent with hepatic steatosis. A low-attenuation structure in the left lobe is most consistent with cysts but difficult to assess by CT. No other  hepatic abnormality is seen. The gallbladder has previously been resected. Pancreas: The pancreas is normal in size and the pancreatic duct is not dilated. Spleen: The spleen is unremarkable. Adrenals/Urinary Tract: The adrenal glands appear normal. The kidneys enhance with no renal calculus noted. No hydronephrosis is seen. No renal mass is evident. The ureters appear normal in caliber. The urinary bladder is moderately distended  with no abnormality noted. Stomach/Bowel: The stomach is largely decompressed. No small bowel dilatation or edema is seen. The rectosigmoid colon is somewhat elongated and tortuous. Scattered colonic diverticula are present but no diverticulitis is seen. There is contrast and feces throughout the nondistended colon. The terminal ileum is unremarkable. The appendix fills with contrast and air and no inflammatory process is noted within the right lower quadrant. Vascular/Lymphatic: The abdominal aorta is normal in caliber with moderate abdominal aorta atherosclerotic change. No adenopathy is seen. Reproductive: The uterus is normal in size. No adnexal lesion is seen. No free fluid is present within the pelvis. Other: No abdominal wall hernia is evident. Musculoskeletal: The lumbar vertebrae are in normal alignment. There is degenerative disc disease most marked at L4-5 and L2-3 levels. There is degenerative change involving the facet joints diffusely. No compression deformity is seen. There are degenerative changes in both hips. IMPRESSION: 1. No explanation for the patient's abdominal pain and bloating is seen. 2. Probable seroma within the left breast with scarring from prior lumpectomy. 3. Hepatic steatosis. 4. Moderate abdominal aortic atherosclerosis. Electronically Signed   By: Ivar Drape M.D.   On: 03/08/2018 16:43       Assessment & Plan:   Problem List Items Addressed This Visit    Abdominal pain    Previous EGD and colonoscopy 05/2017.  Gastritis.  Had CT as outlined.  On omeprazole.  Just saw GI.  Added carafate.  Currently doing well.  Follow.        Abnormal liver function test    Saw GI.  Fatty liver on CT.  Questions answered.  Discussed diet and exercise.  Discussed the need for twinrx.  Follow liver function tests.        Relevant Orders   Hepatic function panel   Breast cancer ([place])    Mammogram 08/2017 - Birads II.  Followed by Dr Bary Castilla.        CAD (coronary artery disease)     Followed by cardiology.  Continue risk factor modification.  Stable.       COPD (chronic obstructive pulmonary disease) ([place])    Followed by Dr Raul Del.  Breathing stable.        Diabetes mellitus with cardiac complication (HCC)    Low carb diet and exercise.  Follow met b and a1c.        Relevant Orders   Hemoglobin J1B   Basic metabolic panel   GERD (gastroesophageal reflux disease)    Saw GI.  On omeprazole.  GI recommended adding carafate.  Stable.        Hypercholesterolemia    On crestor.  Low cholesterol diet and exercise.  Follow lipid panel and liver function tests.        Relevant Orders   Lipid panel   Hypertension    Blood pressure under good control.  Continue same medication regimen.  Follow pressures.  Follow metabolic panel.            Einar Pheasant, MD

## 2018-04-20 ENCOUNTER — Other Ambulatory Visit (INDEPENDENT_AMBULATORY_CARE_PROVIDER_SITE_OTHER): Payer: Self-pay | Admitting: Vascular Surgery

## 2018-04-20 DIAGNOSIS — I739 Peripheral vascular disease, unspecified: Secondary | ICD-10-CM

## 2018-04-20 DIAGNOSIS — I679 Cerebrovascular disease, unspecified: Secondary | ICD-10-CM

## 2018-04-22 ENCOUNTER — Ambulatory Visit (INDEPENDENT_AMBULATORY_CARE_PROVIDER_SITE_OTHER): Payer: Medicare Other | Admitting: Vascular Surgery

## 2018-04-22 ENCOUNTER — Ambulatory Visit (INDEPENDENT_AMBULATORY_CARE_PROVIDER_SITE_OTHER): Payer: Medicare Other

## 2018-04-22 ENCOUNTER — Encounter (INDEPENDENT_AMBULATORY_CARE_PROVIDER_SITE_OTHER): Payer: Self-pay | Admitting: Vascular Surgery

## 2018-04-22 VITALS — BP 153/71 | HR 66 | Resp 18 | Ht 64.5 in | Wt 205.6 lb

## 2018-04-22 DIAGNOSIS — I679 Cerebrovascular disease, unspecified: Secondary | ICD-10-CM

## 2018-04-22 DIAGNOSIS — E1159 Type 2 diabetes mellitus with other circulatory complications: Secondary | ICD-10-CM | POA: Diagnosis not present

## 2018-04-22 DIAGNOSIS — I10 Essential (primary) hypertension: Secondary | ICD-10-CM

## 2018-04-22 DIAGNOSIS — I6523 Occlusion and stenosis of bilateral carotid arteries: Secondary | ICD-10-CM | POA: Diagnosis not present

## 2018-04-22 DIAGNOSIS — I739 Peripheral vascular disease, unspecified: Secondary | ICD-10-CM

## 2018-04-22 NOTE — Progress Notes (Signed)
MRN : [id number]  Lindsey Bentley is a 72 y.o. (03-10-1946) female who presents with chief complaint of  Chief Complaint  Patient presents with  . Follow-up    2 years  .  History of Present Illness: Patient returns today in follow up of her vascular issues.  No major complaints or problems since her last visit.  No focal neurologic symptoms.  Carotid duplex today continues to show mild 1 to 39% carotid artery stenosis bilaterally. She continues to walk reasonably well.  She is over a decade status post iliac intervention for peripheral arterial disease.  Her ABIs today are normal at 1.2 on the right and 1.1 on the left with triphasic waveforms.  Current Outpatient Medications  Medication Sig Dispense Refill  . aspirin (BAYER ASPIRIN) 325 MG tablet Take 325 mg by mouth daily.    . carvedilol (COREG) 12.5 MG tablet TAKE 1 TABLET(12.5 MG) BY MOUTH TWICE DAILY WITH MEALS    . Cholecalciferol (VITAMIN D-1000 MAX ST) 1000 units tablet Take 1,000 Units by mouth daily.     [name] letrozole (FEMARA) 2.5 MG tablet TAKE 1 TABLET BY MOUTH EVERY DAY 90 tablet 4  . losartan-hydrochlorothiazide (HYZAAR) 100-25 MG tablet TAKE 1 TABLET BY MOUTH DAILY 90 tablet 0  . meclizine (ANTIVERT) 25 MG tablet Take 1 tablet (25 mg total) by mouth 3 (three) times daily as needed for dizziness or nausea. 20 tablet 0  . omeprazole (PRILOSEC) 40 MG capsule Take by mouth.    [name] PROAIR HFA 108 (90 Base) MCG/ACT inhaler INL 2 INHALATIONS ITL Q 6 H PRF WHZ  1  . rosuvastatin (CRESTOR) 20 MG tablet Take 1 tablet (20 mg total) by mouth daily. 90 tablet 1  . vitamin E 400 UNIT capsule Take 400 Units by mouth daily.     No current facility-administered medications for this visit.     Past Medical History:  Diagnosis Date  . Arthritis   . Breast cancer, left ([place]) 2016   LT LUMPECTOMY - TI, NO, MO - IDC, ER/PR pos, Her 2 neg, Rad tx's.   . Carotid artery occlusion   . Carotid artery occlusion   . 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 CHF (congestive heart failure) ([place])   . COPD (chronic obstructive pulmonary disease) ([place])   . Coronary artery disease   . Depression   . Diffuse cystic mastopathy 2013  . DVT (deep venous thrombosis) ([place])   . Dyspnea   . Elevated TSH   . Family history of adverse reaction to anesthesia    Pt stated that son had a seizure with a combination of anesthesia and pain medication."  . Fatty liver   . GERD (gastroesophageal reflux disease)   . Glaucoma   . High cholesterol   . History of chicken pox   . Hyperglycemia   . Hyperlipidemia   . Hypertension   . LVH (left ventricular hypertrophy)   . PAC (premature atrial contraction)   . Palpitations   . Peripheral vascular disease ([place])   . Personal history of radiation therapy 2016   BREAST CA  . Pneumonia March 2014  . Skin cancer 2013  . Sleep apnea    wears CPAP set at 2.5  . Wears glasses     Past Surgical History:  Procedure Laterality Date  . BACK SURGERY  1986   ruptured disc  . BREAST BIOPSY Left 2008   NEG  . BREAST BIOPSY Left  08-20-14   POS  . BREAST BIOPSY Right 2007   NEG  . BREAST EXCISIONAL BIOPSY Left 1984   NEG  . 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 BREAST SURGERY Left 09/05/2014   T1a,N0; 5 mm  ER/PR positive. HER2 negative.  Wide excision with SLN biopsy.  MammoSite radiation  . CARPAL TUNNEL RELEASE    . CHOLECYSTECTOMY  2006  . COLONOSCOPY  2010   Dr. Jamal Collin  . COLONOSCOPY WITH PROPOFOL N/A 05/17/2017   Procedure: COLONOSCOPY WITH PROPOFOL;  Surgeon: Manya Silvas, MD;  Location: [facility] ENDOSCOPY;  Service: Endoscopy;  Laterality: N/A;  . DILATION AND CURETTAGE OF UTERUS    . ESOPHAGOGASTRODUODENOSCOPY (EGD) WITH PROPOFOL N/A 05/17/2017   Procedure: ESOPHAGOGASTRODUODENOSCOPY (EGD) WITH PROPOFOL;  Surgeon: Manya Silvas, MD;  Location: [facility] ENDOSCOPY;  Service: Endoscopy;  Laterality: N/A;  . LAPAROTOMY FOR REMOVAL TUMOR LUMBAR PLEXES    . Leg  stent  2011  . MOLE REMOVAL  2013   15 removed  . POSTERIOR CERVICAL LAMINECTOMY Left 10/15/2015   Procedure: Left Cervical four- five Hemilaminectomy/Remove mass;  Surgeon: Leeroy Cha, MD;  Location: [facility] NEURO ORS;  Service: Neurosurgery;  Laterality: Left;  Left C4-5 Hemilaminectomy/Remove mass    Social History  Substance Use Topics  . Smoking status: Never Smoker  . Smokeless tobacco: Never Used  . Alcohol use No  no IVDU  Family History       Family History  Problem Relation Age of Onset  . Cancer Father     Prostate  . Diabetes Father   . Cerebrovascular Accident Father   . Hypertension Mother   . AAA (abdominal aortic aneurysm) Mother   . Hyperlipidemia Other     Parent  . Miscarriages / Stillbirths Other     Parent  . Hypertension Other     parent  . Heart disease Other     Parent  . Breast cancer Maternal Aunt 72         Allergies  Allergen Reactions  . Codeine Nausea And Vomiting  . Adhesive [Tape] Itching and Rash    Please use "paper" tape  . Prednisone Swelling  . Statins Other (See Comments)    Muscle Pain, can take Crestor     REVIEW OF SYSTEMS (Negative unless checked)  Constitutional: '[]'$ ?Weight loss  '[]'$ ?Fever  '[]'$ ?Chills Cardiac: '[]'$ ?Chest pain   '[]'$ ?Chest pressure   '[]'$ ?Palpitations   '[]'$ ?Shortness of breath when laying flat   '[]'$ ?Shortness of breath at rest   '[]'$ ?Shortness of breath with exertion. Vascular:  '[]'$ ?Pain in legs with walking   '[]'$ ?Pain in legs at rest   '[]'$ ?Pain in legs when laying flat   '[]'$ ?Claudication   '[]'$ ?Pain in feet when walking  '[]'$ ?Pain in feet at rest  '[]'$ ?Pain in feet when laying flat   '[]'$ ?History of DVT   '[]'$ ?Phlebitis   '[]'$ ?Swelling in legs   '[]'$ ?Varicose veins   '[]'$ ?Non-healing ulcers Pulmonary:   '[]'$ ?Uses home oxygen   '[]'$ ?Productive cough   '[]'$ ?Hemoptysis   '[]'$ ?Wheeze  '[]'$ ?COPD   '[]'$ ?Asthma Neurologic:  '[]'$ ?Dizziness  '[]'$ ?Blackouts   '[]'$ ?Seizures   '[]'$ ?History of stroke   '[]'$ ?History of TIA  '[]'$ ?Aphasia    '[]'$ ?Temporary blindness   '[]'$ ?Dysphagia   '[x]'$ ?Weakness or numbness in arms   '[]'$ ?Weakness or numbness in legs Musculoskeletal:  '[]'$ ?Arthritis   '[]'$ ?Joint swelling   '[]'$ ?Joint pain   '[]'$ ?Low back pain Hematologic:  '[]'$ ?Easy bruising  '[]'$ ?Easy bleeding   '[]'$ ?Hypercoagulable state   '[]'$ ?Anemic  '[]'$ ?Hepatitis Gastrointestinal:  '[]'$ ?Blood  in stool   '[]'$ ?Vomiting blood  '[]'$ ?Gastroesophageal reflux/heartburn   '[]'$ ?Difficulty swallowing. Genitourinary:  '[]'$ ?Chronic kidney disease   '[]'$ ?Difficult urination  '[]'$ ?Frequent urination  '[]'$ ?Burning with urination   '[]'$ ?Blood in urine Skin:  '[]'$ ?Rashes   '[]'$ ?Ulcers   '[]'$ ?Wounds Psychological:  '[]'$ ?History of anxiety   '[]'$ ? History of major depression.    Physical Examination  BP (!) 153/71 (BP Location: Right Arm, Patient Position: Sitting)   Pulse 66   Resp 18   Ht 5' 4.5" (1.638 m)   Wt 205 lb 9.6 oz (93.3 kg)   BMI 34.75 kg/m  Gen:  WD/WN, NAD Head: [place]/AT, No temporalis wasting. Ear/Nose/Throat: Hearing grossly intact, nares w/o erythema or drainage Eyes: Conjunctiva clear. Sclera non-icteric Neck: Supple.  Trachea midline Pulmonary:  Good air movement, no use of accessory muscles.  Cardiac: RRR, no JVD Vascular:  Vessel Right Left  Radial Palpable Palpable                          PT Palpable Palpable  DP Palpable Palpable   Gastrointestinal: soft, non-tender/non-distended. No guarding/reflex.  Musculoskeletal: M/S 5/5 throughout.  No deformity or atrophy. No edema. Neurologic: Sensation grossly intact in extremities.  Symmetrical.  Speech is fluent.  Psychiatric: Judgment intact, Mood & affect appropriate for pt's clinical situation. Dermatologic: No rashes or ulcers noted.  No cellulitis or open wounds.       Labs Recent Results (from the past 2160 hour(s))  Microalbumin / creatinine urine ratio     Status: None   Collection Time: 03/03/18  8:25 AM  Result Value Ref Range   Microalb, Ur <0.7 0.0 - 1.9 mg/dL   Creatinine,U 70.0 mg/dL   Microalb  Creat Ratio 1.0 0.0 - 30.0 mg/g  Basic metabolic panel     Status: Abnormal   Collection Time: 03/03/18  8:25 AM  Result Value Ref Range   Sodium 139 135 - 145 mEq/L   Potassium 4.3 3.5 - 5.1 mEq/L   Chloride 103 96 - 112 mEq/L   CO2 27 19 - 32 mEq/L   Glucose, Bld 138 (H) 70 - 99 mg/dL   BUN 18 6 - 23 mg/dL   Creatinine, Ser 0.95 0.40 - 1.20 mg/dL   Calcium 9.6 8.4 - 10.5 mg/dL   GFR 61.19 >60.00 mL/min  Lipid panel     Status: Abnormal   Collection Time: 03/03/18  8:25 AM  Result Value Ref Range   Cholesterol 159 0 - 200 mg/dL    Comment: ATP III Classification       Desirable:  < 200 mg/dL               Borderline High:  200 - 239 mg/dL          High:  > = 240 mg/dL   Triglycerides 206.0 (H) 0.0 - 149.0 mg/dL    Comment: Normal:  <150 mg/dLBorderline High:  150 - 199 mg/dL   HDL 40.40 >39.00 mg/dL   VLDL 41.2 (H) 0.0 - 40.0 mg/dL   Total CHOL/HDL Ratio 4     Comment:                Men          Women1/2 Average Risk     3.4          3.3Average Risk          5.0          4.42X Average Risk  9.6          7.13X Average Risk          15.0          11.0                       NonHDL 118.42     Comment: NOTE:  Non-HDL goal should be 30 mg/dL higher than patient's LDL goal (i.e. LDL goal of < 70 mg/dL, would have non-HDL goal of < 100 mg/dL)  Hepatic function panel     Status: Abnormal   Collection Time: 03/03/18  8:25 AM  Result Value Ref Range   Total Bilirubin 0.5 0.2 - 1.2 mg/dL   Bilirubin, Direct 0.1 0.0 - 0.3 mg/dL   Alkaline Phosphatase 69 39 - 117 U/L   AST 21 0 - 37 U/L   ALT 36 (H) 0 - 35 U/L   Total Protein 7.5 6.0 - 8.3 g/dL   Albumin 4.4 3.5 - 5.2 g/dL  Hemoglobin A1c     Status: Abnormal   Collection Time: 03/03/18  8:25 AM  Result Value Ref Range   Hgb A1c MFr Bld 6.9 (H) 4.6 - 6.5 %    Comment: Glycemic Control Guidelines for People with Diabetes:Non Diabetic:  <6%Goal of Therapy: <7%Additional Action Suggested:  >8%   CBC with Differential/Platelet      Status: None   Collection Time: 03/03/18  8:25 AM  Result Value Ref Range   WBC 5.8 4.0 - 10.5 K/uL   RBC 3.91 3.87 - 5.11 Mil/uL   Hemoglobin 12.9 12.0 - 15.0 g/dL   HCT 36.3 36.0 - 46.0 %   MCV 92.7 78.0 - 100.0 fl   MCHC 35.6 30.0 - 36.0 g/dL   RDW 13.4 11.5 - 15.5 %   Platelets 192.0 150.0 - 400.0 K/uL   Neutrophils Relative % 57.6 43.0 - 77.0 %   Lymphocytes Relative 30.7 12.0 - 46.0 %   Monocytes Relative 8.7 3.0 - 12.0 %   Eosinophils Relative 2.5 0.0 - 5.0 %   Basophils Relative 0.5 0.0 - 3.0 %   Neutro Abs 3.3 1.4 - 7.7 K/uL   Lymphs Abs 1.8 0.7 - 4.0 K/uL   Monocytes Absolute 0.5 0.1 - 1.0 K/uL   Eosinophils Absolute 0.1 0.0 - 0.7 K/uL   Basophils Absolute 0.0 0.0 - 0.1 K/uL  LDL cholesterol, direct     Status: None   Collection Time: 03/03/18  8:25 AM  Result Value Ref Range   Direct LDL 108.0 mg/dL    Comment: Optimal:  <100 mg/dLNear or Above Optimal:  100-129 mg/dLBorderline High:  130-159 mg/dLHigh:  160-189 mg/dLVery High:  >190 mg/dL  Hepatic function panel     Status: None   Collection Time: 03/22/18  2:08 PM  Result Value Ref Range   Total Bilirubin 0.6 0.2 - 1.2 mg/dL   Bilirubin, Direct 0.1 0.0 - 0.3 mg/dL   Alkaline Phosphatase 67 39 - 117 U/L   AST 20 0 - 37 U/L   ALT 28 0 - 35 U/L   Total Protein 7.0 6.0 - 8.3 g/dL   Albumin 4.4 3.5 - 5.2 g/dL  HAV/HBV (Profile VII) ([facility])     Status: None   Collection Time: 03/22/18  2:50 PM  Result Value Ref Range   Hep A IgM Negative Negative   Hep A Total Ab Negative Negative   Hepatitis B Surface Ag Negative Negative   Hep B E Ag Negative  Negative   Hep B C IgM Negative Negative   Hep B Core Total Ab Negative Negative   Hep B E Ab Negative Negative   Hep B Surface Ab, Qual Non Reactive     Comment:               Non Reactive: Inconsistent with immunity,                             less than 10 mIU/mL               Reactive:     Consistent with immunity,                             greater than 9.9  mIU/mL   Hepatitis C Antibody     Status: None   Collection Time: 03/22/18  2:50 PM  Result Value Ref Range   Hep C Virus Ab <0.1 0.0 - 0.9 s/co ratio    Comment:                                   Negative:     < 0.8                              Indeterminate: 0.8 - 0.9                                   Positive:     > 0.9  The CDC recommends that a positive HCV antibody result  be followed up with a HCV Nucleic Acid Amplification  test (970263).     Radiology No results found.  Assessment/Plan Hypercholesterolemia lipid control important in reducing the progression of atherosclerotic disease. Continue statin therapy   Hypertension blood pressure control important in reducing the progression of atherosclerotic disease. On appropriate oral medications.  Carotid artery stenosis Carotid stenosis remains mild in the 1-39% range bilaterally. Asymptomatic. Continue aspirin and statin agent. Plan to recheck in 2 years with duplex.  Peripheral vascular disease Previous history of revascularization many years ago. No worrisome symptoms of claudication, ischemic rest pain, or ulceration. ABIs today are normal at 1.2 on the right and 1.1 on the left. Plan to recheck in 2 years with noninvasive studies.    Leotis Pain, MD  04/22/2018 3:09 PM    This note was created with Dragon medical transcription system.  Any errors from dictation are purely unintentional

## 2018-04-22 NOTE — Patient Instructions (Signed)

## 2018-04-25 ENCOUNTER — Encounter: Payer: Self-pay | Admitting: Internal Medicine

## 2018-04-25 NOTE — Assessment & Plan Note (Signed)
Followed by cardiology.  Continue risk factor modification.  Stable.   

## 2018-04-25 NOTE — Assessment & Plan Note (Signed)
Blood pressure under good control.  Continue same medication regimen.  Follow pressures.  Follow metabolic panel.   

## 2018-04-25 NOTE — Assessment & Plan Note (Signed)
Mammogram 08/2017 - Birads II.  Followed by Dr Bary Castilla.

## 2018-04-25 NOTE — Assessment & Plan Note (Signed)
Low carb diet and exercise.  Follow met b and a1c.

## 2018-04-25 NOTE — Assessment & Plan Note (Signed)
Previous EGD and colonoscopy 05/2017.  Gastritis.  Had CT as outlined.  On omeprazole.  Just saw GI.  Added carafate.  Currently doing well.  Follow.

## 2018-04-25 NOTE — Assessment & Plan Note (Signed)
Saw GI.  Fatty liver on CT.  Questions answered.  Discussed diet and exercise.  Discussed the need for twinrx.  Follow liver function tests.

## 2018-04-25 NOTE — Assessment & Plan Note (Signed)
On crestor.  Low cholesterol diet and exercise.  Follow lipid panel and liver function tests.   

## 2018-04-25 NOTE — Assessment & Plan Note (Signed)
Saw GI.  On omeprazole.  GI recommended adding carafate.  Stable.

## 2018-04-25 NOTE — Assessment & Plan Note (Signed)
Followed by Dr Fleming.  Breathing stable.   

## 2018-04-28 ENCOUNTER — Encounter: Payer: Medicare Other | Admitting: Internal Medicine

## 2018-05-08 ENCOUNTER — Other Ambulatory Visit: Payer: Self-pay | Admitting: Internal Medicine

## 2018-05-09 DIAGNOSIS — G4733 Obstructive sleep apnea (adult) (pediatric): Secondary | ICD-10-CM | POA: Diagnosis not present

## 2018-05-24 ENCOUNTER — Ambulatory Visit: Payer: Medicare Other

## 2018-06-07 ENCOUNTER — Other Ambulatory Visit: Payer: Self-pay | Admitting: Internal Medicine

## 2018-06-15 ENCOUNTER — Other Ambulatory Visit (INDEPENDENT_AMBULATORY_CARE_PROVIDER_SITE_OTHER): Payer: Medicare Other

## 2018-06-15 DIAGNOSIS — R945 Abnormal results of liver function studies: Secondary | ICD-10-CM | POA: Diagnosis not present

## 2018-06-15 DIAGNOSIS — E1159 Type 2 diabetes mellitus with other circulatory complications: Secondary | ICD-10-CM | POA: Diagnosis not present

## 2018-06-15 DIAGNOSIS — E7800 Pure hypercholesterolemia, unspecified: Secondary | ICD-10-CM | POA: Diagnosis not present

## 2018-06-15 DIAGNOSIS — R7989 Other specified abnormal findings of blood chemistry: Secondary | ICD-10-CM

## 2018-06-15 LAB — HEPATIC FUNCTION PANEL
ALT: 26 U/L (ref 0–35)
AST: 17 U/L (ref 0–37)
Albumin: 4.3 g/dL (ref 3.5–5.2)
Alkaline Phosphatase: 64 U/L (ref 39–117)
Bilirubin, Direct: 0.1 mg/dL (ref 0.0–0.3)
Total Bilirubin: 0.6 mg/dL (ref 0.2–1.2)
Total Protein: 7.1 g/dL (ref 6.0–8.3)

## 2018-06-15 LAB — BASIC METABOLIC PANEL
BUN: 19 mg/dL (ref 6–23)
CO2: 25 mEq/L (ref 19–32)
Calcium: 9.7 mg/dL (ref 8.4–10.5)
Chloride: 104 mEq/L (ref 96–112)
Creatinine, Ser: 0.9 mg/dL (ref 0.40–1.20)
GFR: 61.23 mL/min (ref >60.00)
Glucose, Bld: 136 mg/dL — ABNORMAL HIGH (ref 70–99)
Potassium: 4.1 mEq/L (ref 3.5–5.1)
Sodium: 140 mEq/L (ref 135–145)

## 2018-06-15 LAB — HEMOGLOBIN A1C: Hgb A1c MFr Bld: 7.2 % — ABNORMAL HIGH (ref 4.6–6.5)

## 2018-06-15 LAB — LIPID PANEL
CHOL/HDL RATIO: 4
Cholesterol: 162 mg/dL (ref 0–200)
HDL: 36.4 mg/dL — ABNORMAL LOW (ref >39.00)
NonHDL: 125.7
Triglycerides: 240 mg/dL — ABNORMAL HIGH (ref 0.0–149.0)
VLDL: 48 mg/dL — AB (ref 0.0–40.0)

## 2018-06-15 LAB — LDL CHOLESTEROL, DIRECT: Direct LDL: 100 mg/dL

## 2018-06-20 ENCOUNTER — Encounter: Payer: Self-pay | Admitting: Internal Medicine

## 2018-06-20 ENCOUNTER — Ambulatory Visit (INDEPENDENT_AMBULATORY_CARE_PROVIDER_SITE_OTHER): Payer: Medicare Other | Admitting: Internal Medicine

## 2018-06-20 DIAGNOSIS — I10 Essential (primary) hypertension: Secondary | ICD-10-CM

## 2018-06-20 DIAGNOSIS — E1159 Type 2 diabetes mellitus with other circulatory complications: Secondary | ICD-10-CM

## 2018-06-20 DIAGNOSIS — I6523 Occlusion and stenosis of bilateral carotid arteries: Secondary | ICD-10-CM

## 2018-06-20 DIAGNOSIS — I739 Peripheral vascular disease, unspecified: Secondary | ICD-10-CM | POA: Diagnosis not present

## 2018-06-20 DIAGNOSIS — R7989 Other specified abnormal findings of blood chemistry: Secondary | ICD-10-CM

## 2018-06-20 DIAGNOSIS — E7800 Pure hypercholesterolemia, unspecified: Secondary | ICD-10-CM

## 2018-06-20 DIAGNOSIS — I25119 Atherosclerotic heart disease of native coronary artery with unspecified angina pectoris: Secondary | ICD-10-CM | POA: Diagnosis not present

## 2018-06-20 DIAGNOSIS — C50912 Malignant neoplasm of unspecified site of left female breast: Secondary | ICD-10-CM

## 2018-06-20 DIAGNOSIS — Z0000 Encounter for general adult medical examination without abnormal findings: Secondary | ICD-10-CM

## 2018-06-20 DIAGNOSIS — J449 Chronic obstructive pulmonary disease, unspecified: Secondary | ICD-10-CM

## 2018-06-20 DIAGNOSIS — R945 Abnormal results of liver function studies: Secondary | ICD-10-CM

## 2018-06-20 DIAGNOSIS — K219 Gastro-esophageal reflux disease without esophagitis: Secondary | ICD-10-CM

## 2018-06-20 LAB — HM DIABETES FOOT EXAM

## 2018-06-20 MED ORDER — METFORMIN HCL ER 500 MG PO TB24: 500.0000 mg | ORAL_TABLET | Freq: Every day | ORAL | 2 refills | Status: DC

## 2018-06-20 NOTE — Assessment & Plan Note (Signed)
Blood pressure as outlined.  She will monitor her blood pressure.  Follow metabolic panel.

## 2018-06-20 NOTE — Assessment & Plan Note (Signed)
Followed by [facility].  Last evaluated 04/2018.  Recommended f/u in 2 years.   

## 2018-06-20 NOTE — Assessment & Plan Note (Signed)
Followed by Dr Fleming.  Breathing stable.   

## 2018-06-20 NOTE — Assessment & Plan Note (Signed)
On crestor.  Low cholesterol diet and exercise.  Discussed recent cholesterol results.  Increase crestor to 20mg  q day.  Follow lipid panel and liver function tests.

## 2018-06-20 NOTE — Assessment & Plan Note (Signed)
Evaluated by GI.  Symptoms controlled.

## 2018-06-20 NOTE — Assessment & Plan Note (Signed)
Saw GI.  Fatty liver on CT.  Have discussed diet and exercise.  Follow liver function tests.   

## 2018-06-20 NOTE — Assessment & Plan Note (Signed)
Followed by Dr Bary Castilla.  Mammogram 08/2017 - Birads II.

## 2018-06-20 NOTE — Progress Notes (Addendum)
[name], female   DOB: June 23, 1945, 73 y.o.   MRN: [id number]   Subjective:    [name], female    DOB: 1945/11/08, 73 y.o.   MRN: [id number]  HPI  Patient here for her physical exam.  She reports she is doing relatively well.  Feels better. Saw [facility] 04/2018.  Stable.  Recommended f/u in two years.  No chest pain.  Breathing stable.  No acid reflux.  No abdominal pain.  Bowels moving.  No urine change.  Discussed shingles vaccine.  Not sure if had chicken pox. Now seeing Dr Lindsey Bentley for f/u of her breast.  Due to see Dr Lindsey Bentley next week.  Had colonoscopy 05/2017.  Discussed labs.  Discussed increasing crestor to '20mg'$  q day.  Discussed the need for shingrx.      Past Medical History:  Diagnosis Date  . Arthritis   . Breast cancer, left ([place]) 2016   LT LUMPECTOMY - TI, NO, MO - IDC, ER/PR pos, Her 2 neg, Rad tx's.   . Carotid artery occlusion   . Carotid artery occlusion   . 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 CHF (congestive heart failure) ([place])   . COPD (chronic obstructive pulmonary disease) ([place])   . Coronary artery disease   . Depression   . Diffuse cystic mastopathy 2013  . DVT (deep venous thrombosis) ([place])   . Dyspnea   . Elevated TSH   . Family history of adverse reaction to anesthesia    Pt stated that son had a seizure with a combination of anesthesia and pain medication."  . Fatty liver   . GERD (gastroesophageal reflux disease)   . Glaucoma   . High cholesterol   . History of chicken pox   . Hyperglycemia   . Hyperlipidemia   . Hypertension   . LVH (left ventricular hypertrophy)   . PAC (premature atrial contraction)   . Palpitations   . Peripheral vascular disease ([place])   . Personal history of radiation therapy 2016   BREAST CA  . Pneumonia March 2014  . Skin cancer 2013  . Sleep apnea    wears CPAP set at 2.5  . Wears glasses    Past Surgical History:  Procedure Laterality Date  . BACK  SURGERY  1986   ruptured disc  . BREAST BIOPSY Left 2008   NEG  . BREAST BIOPSY Left 08-20-14   POS  . BREAST BIOPSY Right 2007   NEG  . BREAST EXCISIONAL BIOPSY Left 1984   NEG  . 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 BREAST SURGERY Left 09/05/2014   T1a,N0; 5 mm  ER/PR positive. HER2 negative.  Wide excision with SLN biopsy.  MammoSite radiation  . CARPAL TUNNEL RELEASE    . CHOLECYSTECTOMY  2006  . COLONOSCOPY  2010   Dr. Jamal Collin  . COLONOSCOPY WITH PROPOFOL N/A 05/17/2017   Procedure: COLONOSCOPY WITH PROPOFOL;  Surgeon: Manya Silvas, MD;  Location: [facility] ENDOSCOPY;  Service: Endoscopy;  Laterality: N/A;  . DILATION AND CURETTAGE OF UTERUS    . ESOPHAGOGASTRODUODENOSCOPY (EGD) WITH PROPOFOL N/A 05/17/2017   Procedure: ESOPHAGOGASTRODUODENOSCOPY (EGD) WITH PROPOFOL;  Surgeon: Manya Silvas, MD;  Location: [facility] ENDOSCOPY;  Service: Endoscopy;  Laterality: N/A;  . LAPAROTOMY FOR REMOVAL TUMOR LUMBAR PLEXES    . Leg stent  2011  . MOLE REMOVAL  2013   15 removed  . POSTERIOR CERVICAL LAMINECTOMY Left 10/15/2015  Procedure: Left Cervical four- five Hemilaminectomy/Remove mass;  Surgeon: Leeroy Cha, MD;  Location: [place] NEURO ORS;  Service: Neurosurgery;  Laterality: Left;  Left C4-5 Hemilaminectomy/Remove mass   Family History  Problem Relation Age of Onset  . Cancer Father        Prostate  . Diabetes Father   . Cerebrovascular Accident Father   . Hypertension Mother   . AAA (abdominal aortic aneurysm) Mother   . Hyperlipidemia Other        Parent  . Miscarriages / Stillbirths Other        Parent  . Hypertension Other        parent  . Heart disease Other        Parent  . Breast cancer Maternal Aunt 72   Social History   Socioeconomic History  . Marital status: Married    Spouse name: Not on file  . Number of children: 3  . Years of education: 36  . Highest education level: Not on file  Occupational History  . Occupation: Caregiver   Social Needs  .  Financial resource strain: Not on file  . Food insecurity:    Worry: Not on file    Inability: Not on file  . Transportation needs:    Medical: Not on file    Non-medical: Not on file  Tobacco Use  . Smoking status: Never Smoker  . Smokeless tobacco: Never Used  Substance and Sexual Activity  . Alcohol use: No    Alcohol/week: 0.0 standard drinks  . Drug use: No  . Sexual activity: Never  Lifestyle  . Physical activity:    Days per week: Not on file    Minutes per session: Not on file  . Stress: Not on file  Relationships  . Social connections:    Talks on phone: Not on file    Gets together: Not on file    Attends religious service: Not on file    Active member of club or organization: Not on file    Attends meetings of clubs or organizations: Not on file    Relationship status: Not on file  Other Topics Concern  . Not on file  Social History Narrative   Regular exercise-mo   Caffeine Use-no    Outpatient Encounter Medications as of 06/20/2018  Medication Sig  . [DISCONTINUED] carvedilol (COREG) 12.5 MG tablet TAKE 1 TABLET(12.5 MG) BY MOUTH TWICE DAILY WITH MEALS  . [DISCONTINUED] omeprazole (PRILOSEC) 40 MG capsule TAKE 1 CAPSULE BY MOUTH EVERY DAY  . aspirin (BAYER ASPIRIN) 325 MG tablet Take 325 mg by mouth daily.  . carvedilol (COREG) 12.5 MG tablet TAKE 1 TABLET(12.5 MG) BY MOUTH TWICE DAILY WITH MEALS  . Cholecalciferol (VITAMIN D-1000 MAX ST) 1000 units tablet Take 1,000 Units by mouth daily.   [name] letrozole (FEMARA) 2.5 MG tablet TAKE 1 TABLET BY MOUTH EVERY DAY  . losartan-hydrochlorothiazide (HYZAAR) 100-25 MG tablet TAKE 1 TABLET BY MOUTH DAILY  . meclizine (ANTIVERT) 25 MG tablet Take 1 tablet (25 mg total) by mouth 3 (three) times daily as needed for dizziness or nausea.  . metFORMIN (GLUCOPHAGE-XR) 500 MG 24 hr tablet Take 1 tablet (500 mg total) by mouth daily with breakfast.  . omeprazole (PRILOSEC) 40 MG capsule Take by mouth.  [name] PROAIR HFA 108 (90 Base)  MCG/ACT inhaler INL 2 INHALATIONS ITL Q 6 H PRF WHZ  . rosuvastatin (CRESTOR) 20 MG tablet Take 1 tablet (20 mg total) by mouth daily.  . vitamin E  400 UNIT capsule Take 400 Units by mouth daily.  . [DISCONTINUED] rosuvastatin (CRESTOR) 10 MG tablet TAKE 1 TABLET BY MOUTH EVERY DAY   No facility-administered encounter medications on file as of 06/20/2018.     Review of Systems  Constitutional: Negative for appetite change and unexpected weight change.  HENT: Negative for congestion and sinus pressure.   Eyes: Negative for pain and visual disturbance.  Respiratory: Negative for cough, chest tightness and shortness of breath.   Cardiovascular: Negative for chest pain, palpitations and leg swelling.  Gastrointestinal: Negative for abdominal pain, diarrhea, nausea and vomiting.  Genitourinary: Negative for difficulty urinating and dysuria.  Musculoskeletal: Negative for back pain and joint swelling.  Skin: Negative for color change and rash.  Neurological: Negative for dizziness, light-headedness and headaches.  Hematological: Negative for adenopathy. Does not bruise/bleed easily.  Psychiatric/Behavioral: Negative for agitation and dysphoric mood.       Objective:    Physical Exam Constitutional:      General: She is not in acute distress.    Appearance: Normal appearance.  HENT:     Nose: Nose normal. No congestion.     Mouth/Throat:     Pharynx: No oropharyngeal exudate or posterior oropharyngeal erythema.  Eyes:     Conjunctiva/sclera: Conjunctivae normal.  Neck:     Musculoskeletal: Neck supple. No muscular tenderness.     Thyroid: No thyromegaly.  Cardiovascular:     Rate and Rhythm: Normal rate and regular rhythm.  Pulmonary:     Effort: No respiratory distress.     Breath sounds: Normal breath sounds. No wheezing.  Abdominal:     General: Bowel sounds are normal.     Palpations: Abdomen is soft.     Tenderness: There is no abdominal tenderness.  Musculoskeletal:          General: No swelling or tenderness.     Comments: Feet:  No lesions.  DP pulses palpable and equal bilaterally.  Intact to light touch and pin prick.   Lymphadenopathy:     Cervical: No cervical adenopathy.  Skin:    Findings: No erythema or rash.  Neurological:     Mental Status: She is alert.  Psychiatric:        Mood and Affect: Mood normal.        Behavior: Behavior normal.     BP 140/70   Pulse 65   Temp 98.7 F (37.1 C) (Oral)   Ht 5' 4.5" (1.638 m)   Wt 209 lb 9.6 oz (95.1 kg)   SpO2 96%   BMI 35.42 kg/m  Wt Readings from Last 3 Encounters:  06/20/18 209 lb 9.6 oz (95.1 kg)  04/22/18 205 lb 9.6 oz (93.3 kg)  04/19/18 208 lb 9.6 oz (94.6 kg)     Lab Results  Component Value Date   WBC 5.8 03/03/2018   HGB 12.9 03/03/2018   HCT 36.3 03/03/2018   PLT 192.0 03/03/2018   GLUCOSE 136 (H) 06/15/2018   CHOL 162 06/15/2018   TRIG 240.0 (H) 06/15/2018   HDL 36.40 (L) 06/15/2018   LDLDIRECT 100.0 06/15/2018   LDLCALC 64 04/15/2017   ALT 26 06/15/2018   AST 17 06/15/2018   NA 140 06/15/2018   K 4.1 06/15/2018   CL 104 06/15/2018   CREATININE 0.90 06/15/2018   BUN 19 06/15/2018   CO2 25 06/15/2018   TSH 4.12 10/19/2017   INR 0.9 07/13/2012   HGBA1C 7.2 (H) 06/15/2018   MICROALBUR <0.7 03/03/2018    Ct  Abdomen Pelvis W Contrast  Result Date: 03/08/2018 CLINICAL DATA:  Left upper quadrant soreness, abdominal bloating over the last year, intermittent nausea EXAM: CT ABDOMEN AND PELVIS WITH CONTRAST TECHNIQUE: Multidetector CT imaging of the abdomen and pelvis was performed using the standard protocol following bolus administration of intravenous contrast. CONTRAST:  149m ISOVUE-300 IOPAMIDOL (ISOVUE-300) INJECTION 61% COMPARISON:  CT abdomen pelvis of 09/25/2016 FINDINGS: Lower chest: The lung bases are clear. A low-attenuation structure has increased slightly in size within the deep soft tissues of the left breast laterally of 3.6 x 2.5 cm compared to prior  measurement of 2.6 cm. Consistent with seroma and scarring from prior lumpectomy by history. Hepatobiliary: The liver is very low in attenuation consistent with hepatic steatosis. A low-attenuation structure in the left lobe is most consistent with cysts but difficult to assess by CT. No other hepatic abnormality is seen. The gallbladder has previously been resected. Pancreas: The pancreas is normal in size and the pancreatic duct is not dilated. Spleen: The spleen is unremarkable. Adrenals/Urinary Tract: The adrenal glands appear normal. The kidneys enhance with no renal calculus noted. No hydronephrosis is seen. No renal mass is evident. The ureters appear normal in caliber. The urinary bladder is moderately distended with no abnormality noted. Stomach/Bowel: The stomach is largely decompressed. No small bowel dilatation or edema is seen. The rectosigmoid colon is somewhat elongated and tortuous. Scattered colonic diverticula are present but no diverticulitis is seen. There is contrast and feces throughout the nondistended colon. The terminal ileum is unremarkable. The appendix fills with contrast and air and no inflammatory process is noted within the right lower quadrant. Vascular/Lymphatic: The abdominal aorta is normal in caliber with moderate abdominal aorta atherosclerotic change. No adenopathy is seen. Reproductive: The uterus is normal in size. No adnexal lesion is seen. No free fluid is present within the pelvis. Other: No abdominal wall hernia is evident. Musculoskeletal: The lumbar vertebrae are in normal alignment. There is degenerative disc disease most marked at L4-5 and L2-3 levels. There is degenerative change involving the facet joints diffusely. No compression deformity is seen. There are degenerative changes in both hips. IMPRESSION: 1. No explanation for the patient's abdominal pain and bloating is seen. 2. Probable seroma within the left breast with scarring from prior lumpectomy. 3. Hepatic  steatosis. 4. Moderate abdominal aortic atherosclerosis. Electronically Signed   By: PIvar DrapeM.D.   On: 03/08/2018 16:43       Assessment & Plan:   Problem List Items Addressed This Visit    Abnormal liver function test    Saw GI.  Fatty liver on CT.  Have discussed diet and exercise.  Follow liver function tests.         Breast cancer ([place]    Followed by Dr BBary Bentley  Mammogram 08/2017 - Birads II.        CAD (coronary artery disease)    Followed by cardiology.  Continue risk factor modification.        Carotid artery stenosis    Followed by [facility].  Last evaluated 04/2018.  Recommended f/u in 2 years.      [place]    Followed by Dr FRaul Del  Breathing stable.        Diabetes mellitus with cardiac complication (HCC)    Low carb diet and exercise.  Follow met b and a1c.        Relevant Medications   metFORMIN (GLUCOPHAGE-XR) 500 MG 24 hr tablet   Other  Relevant Orders   Hemoglobin A1c   GERD (gastroesophageal reflux disease)    Evaluated by GI.  Symptoms controlled.        Health care maintenance    Physical today 06/20/18.  Colonoscopy 05/17/17.  Per pt, recommended f/u colonoscopy in 3 years.  Mammogram 08/18/17 - Birads II.        Relevant Orders   Varicella zoster antibody, IgG   Hypercholesterolemia    On crestor.  Low cholesterol diet and exercise.  Discussed recent cholesterol results.  Increase crestor to '20mg'$  q day.  Follow lipid panel and liver function tests.        Relevant Orders   Hepatic function panel   Lipid panel   Hypertension    Blood pressure as outlined.  She will monitor her blood pressure.  Follow metabolic panel.        Relevant Orders   TSH   Basic metabolic panel   Peripheral vascular disease ([place])    Followed by [facility].  Last evaluated 04/2018.  Recommended f/u in 2 years.            Einar Pheasant, MD

## 2018-06-20 NOTE — Assessment & Plan Note (Signed)
Followed by cardiology.  Continue risk factor modification.   

## 2018-06-20 NOTE — Assessment & Plan Note (Signed)
Low carb diet and exercise.  Follow met b and a1c.

## 2018-06-20 NOTE — Assessment & Plan Note (Signed)
Physical today 06/20/18.  Colonoscopy 05/17/17.  Per pt, recommended f/u colonoscopy in 3 years.  Mammogram 08/18/17 - Birads II.

## 2018-06-21 NOTE — Addendum Note (Signed)
Addended by: Alisa Graff on: 06/21/2018 05:16 AM   Modules accepted: Orders

## 2018-06-29 ENCOUNTER — Ambulatory Visit
Admission: RE | Admit: 2018-06-29 | Discharge: 2018-06-29 | Disposition: A | Discharge status: Home / Self Care | Payer: Medicare Other | Source: Ambulatory Visit | Attending: Radiation Oncology | Admitting: Radiation Oncology

## 2018-06-29 ENCOUNTER — Other Ambulatory Visit: Payer: Self-pay

## 2018-06-29 VITALS — BP 181/79 | HR 61 | Temp 98.0°F | Resp 18 | Wt 207.1 lb

## 2018-06-29 DIAGNOSIS — Z923 Personal history of irradiation: Secondary | ICD-10-CM | POA: Diagnosis not present

## 2018-06-29 DIAGNOSIS — Z79811 Long term (current) use of aromatase inhibitors: Secondary | ICD-10-CM | POA: Diagnosis not present

## 2018-06-29 DIAGNOSIS — C50512 Malignant neoplasm of lower-outer quadrant of left female breast: Secondary | ICD-10-CM | POA: Diagnosis not present

## 2018-06-29 DIAGNOSIS — Z170 Estrogen receptor positive status [ER+]: Secondary | ICD-10-CM | POA: Diagnosis not present

## 2018-06-29 NOTE — Progress Notes (Signed)
Radiation Oncology Follow up Note  Name: Lindsey Bentley   Date:   06/29/2018 MRN:  [id number] DOB: 03-23-1946    This 73 y.o. female presents to the clinic today for 3-1/2 year follow-up status post accelerated partial breast relation to her left breast for stage I ER/PR positive invasive mammary carcinoma.[name]  REFERRING PROVIDER: Einar Pheasant, MD  HPI: patient is a 73 year old female now seen out over 3-1/2 years having completed radiation therapy to her left breast using accelerated partial breast radiation. Seen today in routine follow she is doing well she specifically denies breast tenderness cough or bone pain. She does have a persistent seroma cavity in the left breast..she had mammograms which I have reviewed back in April 2019.is having some abdominal pain back in October had a CT scan which was unremarkable.she is currently on Femara tolerating that well without side effect.  COMPLICATIONS OF TREATMENT: none  FOLLOW UP COMPLIANCE: keeps appointments   PHYSICAL EXAM:  BP (!) 181/79   Pulse 61   Temp 98 F (36.7 C)   Resp 18   Wt 207 lb 2 oz (93.9 kg)   BMI 35.00 kg/m  Left breast is a well-circumscribed nontender seroma cavity which is unchanged from prior exam. No other dominant mass or nodularity is noted in either breast in 2 positions examined. No axillary or supraclavicular adenopathy is identified.Well-developed well-nourished patient in NAD. HEENT reveals PERLA, EOMI, discs not visualized.  Oral cavity is clear. No oral mucosal lesions are identified. Neck is clear without evidence of cervical or supraclavicular adenopathy. Lungs are clear to A&P. Cardiac examination is essentially unremarkable with regular rate and rhythm without murmur rub or thrill. Abdomen is benign with no organomegaly or masses noted. Motor sensory and DTR levels are equal and symmetric in the upper and lower extremities. Cranial nerves II through XII are grossly intact. Proprioception is intact. No  peripheral adenopathy or edema is identified. No motor or sensory levels are noted. Crude visual fields are within normal range.  RADIOLOGY RESULTS: mammograms and CT scan of abdomen and pelvis reviewed and compatible with the above-stated findings  PLAN: at the present time patient is doing well with no evidence of disease. I'm please were overall progress. She is orally set up for follow-up mammogram. She will continue her Femara until she sees Dr. Grayland Ormond in consultation. I will see her back in 1 year for follow-up and then discontinue follow-up care. Patient is to call with any concerns.  I would like to take this opportunity to thank you for allowing me to participate in the care of your patient.Noreene Filbert, MD

## 2018-07-12 ENCOUNTER — Other Ambulatory Visit: Payer: Self-pay

## 2018-07-12 DIAGNOSIS — Z170 Estrogen receptor positive status [ER+]: Principal | ICD-10-CM

## 2018-07-12 DIAGNOSIS — C50512 Malignant neoplasm of lower-outer quadrant of left female breast: Secondary | ICD-10-CM

## 2018-08-08 DIAGNOSIS — G4733 Obstructive sleep apnea (adult) (pediatric): Secondary | ICD-10-CM | POA: Diagnosis not present

## 2018-08-23 ENCOUNTER — Other Ambulatory Visit: Payer: Medicare Other

## 2018-08-30 ENCOUNTER — Ambulatory Visit: Payer: Medicare Other | Admitting: General Surgery

## 2018-09-14 ENCOUNTER — Other Ambulatory Visit (INDEPENDENT_AMBULATORY_CARE_PROVIDER_SITE_OTHER): Payer: Medicare Other

## 2018-09-14 ENCOUNTER — Ambulatory Visit: Payer: Medicare Other

## 2018-09-14 ENCOUNTER — Other Ambulatory Visit: Payer: Self-pay

## 2018-09-14 DIAGNOSIS — E1159 Type 2 diabetes mellitus with other circulatory complications: Secondary | ICD-10-CM | POA: Diagnosis not present

## 2018-09-14 DIAGNOSIS — I10 Essential (primary) hypertension: Secondary | ICD-10-CM | POA: Diagnosis not present

## 2018-09-14 DIAGNOSIS — E7800 Pure hypercholesterolemia, unspecified: Secondary | ICD-10-CM | POA: Diagnosis not present

## 2018-09-14 DIAGNOSIS — Z0000 Encounter for general adult medical examination without abnormal findings: Secondary | ICD-10-CM | POA: Diagnosis not present

## 2018-09-14 LAB — BASIC METABOLIC PANEL
BUN: 20 mg/dL (ref 6–23)
CO2: 23 mEq/L (ref 19–32)
Calcium: 9.2 mg/dL (ref 8.4–10.5)
Chloride: 105 mEq/L (ref 96–112)
Creatinine, Ser: 0.91 mg/dL (ref 0.40–1.20)
GFR: 60.42 mL/min (ref >60.00)
Glucose, Bld: 150 mg/dL — ABNORMAL HIGH (ref 70–99)
Potassium: 4.1 mEq/L (ref 3.5–5.1)
Sodium: 140 mEq/L (ref 135–145)

## 2018-09-14 LAB — TSH: TSH: 6.97 u[IU]/mL — ABNORMAL HIGH (ref 0.35–4.50)

## 2018-09-14 LAB — HEPATIC FUNCTION PANEL
ALT: 36 U/L — ABNORMAL HIGH (ref 0–35)
AST: 23 U/L (ref 0–37)
Albumin: 4.2 g/dL (ref 3.5–5.2)
Alkaline Phosphatase: 62 U/L (ref 39–117)
Bilirubin, Direct: 0.1 mg/dL (ref 0.0–0.3)
Total Bilirubin: 0.5 mg/dL (ref 0.2–1.2)
Total Protein: 6.8 g/dL (ref 6.0–8.3)

## 2018-09-14 LAB — LIPID PANEL
Cholesterol: 145 mg/dL (ref 0–200)
HDL: 37.3 mg/dL — ABNORMAL LOW
NonHDL: 107.56
Total CHOL/HDL Ratio: 4
Triglycerides: 206 mg/dL — ABNORMAL HIGH (ref 0.0–149.0)
VLDL: 41.2 mg/dL — ABNORMAL HIGH (ref 0.0–40.0)

## 2018-09-14 LAB — HEMOGLOBIN A1C: Hgb A1c MFr Bld: 7.7 % — ABNORMAL HIGH (ref 4.6–6.5)

## 2018-09-14 LAB — LDL CHOLESTEROL, DIRECT: Direct LDL: 84 mg/dL

## 2018-09-15 LAB — VARICELLA ZOSTER ANTIBODY, IGG: Varicella IgG: 2540 {index}

## 2018-09-16 ENCOUNTER — Other Ambulatory Visit: Payer: Self-pay

## 2018-09-16 ENCOUNTER — Telehealth: Payer: Self-pay | Admitting: Internal Medicine

## 2018-09-16 MED ORDER — METFORMIN HCL ER 500 MG PO TB24: 500.0000 mg | ORAL_TABLET | Freq: Every day | ORAL | 2 refills | Status: DC

## 2018-09-16 MED ORDER — LOSARTAN POTASSIUM-HCTZ 100-25 MG PO TABS: 1.0000 | ORAL_TABLET | Freq: Every day | ORAL | 0 refills | Status: DC

## 2018-09-16 NOTE — Telephone Encounter (Signed)
meds refilled 

## 2018-09-16 NOTE — Telephone Encounter (Signed)
Copied from [place] [id number]. Topic: Quick Communication - Rx Refill/Question >> Sep 16, 2018  9:26 AM Margot Ables wrote: Medication: metFORMIN (GLUCOPHAGE-XR) 500 MG 24 hr tablet & losartan-hydrochlorothiazide (HYZAAR) 100-25 MG tablet - pt is out of both medications - pharmacy states they have been requesting since Tuesday 09/13/2018  Has the patient contacted their pharmacy? Yes - pharmacy advised pt to call stating they have sent 2-3 faxes with no response Preferred Pharmacy (with phone number or street name): [id number] [address], [place] [phone] (Phone) [phone] (Fax)

## 2018-09-19 ENCOUNTER — Ambulatory Visit (INDEPENDENT_AMBULATORY_CARE_PROVIDER_SITE_OTHER): Payer: Medicare Other | Admitting: Internal Medicine

## 2018-09-19 ENCOUNTER — Other Ambulatory Visit: Payer: Self-pay

## 2018-09-19 ENCOUNTER — Encounter: Payer: Self-pay | Admitting: Internal Medicine

## 2018-09-19 DIAGNOSIS — E041 Nontoxic single thyroid nodule: Secondary | ICD-10-CM

## 2018-09-19 DIAGNOSIS — I25119 Atherosclerotic heart disease of native coronary artery with unspecified angina pectoris: Secondary | ICD-10-CM

## 2018-09-19 DIAGNOSIS — C50912 Malignant neoplasm of unspecified site of left female breast: Secondary | ICD-10-CM | POA: Diagnosis not present

## 2018-09-19 DIAGNOSIS — R42 Dizziness and giddiness: Secondary | ICD-10-CM

## 2018-09-19 DIAGNOSIS — I6523 Occlusion and stenosis of bilateral carotid arteries: Secondary | ICD-10-CM

## 2018-09-19 DIAGNOSIS — M25473 Effusion, unspecified ankle: Secondary | ICD-10-CM | POA: Diagnosis not present

## 2018-09-19 DIAGNOSIS — I10 Essential (primary) hypertension: Secondary | ICD-10-CM

## 2018-09-19 DIAGNOSIS — R7989 Other specified abnormal findings of blood chemistry: Secondary | ICD-10-CM

## 2018-09-19 DIAGNOSIS — E1159 Type 2 diabetes mellitus with other circulatory complications: Secondary | ICD-10-CM

## 2018-09-19 DIAGNOSIS — K219 Gastro-esophageal reflux disease without esophagitis: Secondary | ICD-10-CM

## 2018-09-19 DIAGNOSIS — E7800 Pure hypercholesterolemia, unspecified: Secondary | ICD-10-CM

## 2018-09-19 DIAGNOSIS — I739 Peripheral vascular disease, unspecified: Secondary | ICD-10-CM

## 2018-09-19 DIAGNOSIS — R945 Abnormal results of liver function studies: Secondary | ICD-10-CM | POA: Diagnosis not present

## 2018-09-19 DIAGNOSIS — J449 Chronic obstructive pulmonary disease, unspecified: Secondary | ICD-10-CM

## 2018-09-19 MED ORDER — METFORMIN HCL ER 500 MG PO TB24: 500.0000 mg | ORAL_TABLET | Freq: Two times a day (BID) | ORAL | 2 refills | Status: DC

## 2018-09-19 MED ORDER — HYDRALAZINE HCL 10 MG PO TABS: 10.0000 mg | ORAL_TABLET | Freq: Three times a day (TID) | ORAL | 1 refills | Status: DC

## 2018-09-19 NOTE — Progress Notes (Signed)
[name], female   DOB: [birth year], 73 y.o.   MRN: [id number]   Virtual Visit via telephone Note  This visit type was conducted due to national recommendations for restrictions regarding the COVID-19 pandemic (e.g. social distancing).  This format is felt to be most appropriate for this patient at this time.  All issues noted in this document were discussed and addressed.  No physical exam was performed (except for noted visual exam findings with Video Visits).   I connected with Lindsey Bentley by telephone and verified that I am speaking with the correct person using two identifiers. Location patient: home Location provider: work Persons participating in the telephone visit: patient, provider  I discussed the limitations, risks, security and privacy concerns of performing an evaluation and management service by telephone and the availability of in person appointments.  The patient expressed understanding and agreed to proceed.   Reason for visit: scheduled follow up.   HPI: She reported that she has been doing relatively well.  Trying to stay in due to COVID restrictions.  No fever.  No chest congestion or sob.  No chest pain.  She has noticed some dizziness.  Intermittent headaches - posterior head.  Not severe, but persistent intermittent issue.  Discussed further w/up.  She declines.  Wants to follow.  Discussed treating elevated sugar, etc.  No chest pain.  Breathing stable.  No acid reflux.  No abdominal pain.  Bowels moving.  Discussed labs.  Discussed diet and exercise.  Blood pressure is elevated.  Sugars doing well. States averaging 120s-134.     ROS: See pertinent positives and negatives per HPI.  Past Medical History:  Diagnosis Date  . Arthritis   . Breast cancer, left ([place]) 2016   LT LUMPECTOMY - TI, NO, MO - IDC, ER/PR pos, Her 2 neg, Rad tx's.   . Carotid artery occlusion   . Carotid artery occlusion   . 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 CHF (congestive heart failure) ([place])   . COPD (chronic obstructive pulmonary disease) ([place])   . Coronary artery disease   . Depression   . Diffuse cystic mastopathy 2013  . DVT (deep venous thrombosis) ([place])   . Dyspnea   . Elevated TSH   . Family history of adverse reaction to anesthesia    Pt stated that son had a seizure with a combination of anesthesia and pain medication."  . Fatty liver   . GERD (gastroesophageal reflux disease)   . Glaucoma   . High cholesterol   . History of chicken pox   . Hyperglycemia   . Hyperlipidemia   . Hypertension   . LVH (left ventricular hypertrophy)   . PAC (premature atrial contraction)   . Palpitations   . Peripheral vascular disease ([place])   . Personal history of radiation therapy 2016   BREAST CA  . Pneumonia March 2014  . Skin cancer 2013  . Sleep apnea    wears CPAP set at 2.5  . Wears glasses     Past Surgical History:  Procedure Laterality Date  . BACK SURGERY  1986   ruptured disc  . BREAST BIOPSY Left 2008   NEG  . BREAST BIOPSY Left 08-20-14   POS  . BREAST BIOPSY Right 2007   NEG  . BREAST EXCISIONAL BIOPSY Left 1984   NEG  . 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 BREAST SURGERY Left 09/05/2014   T1a,N0; 5  mm  ER/PR positive. HER2 negative.  Wide excision with SLN biopsy.  MammoSite radiation  . CARPAL TUNNEL RELEASE    . CHOLECYSTECTOMY  2006  . COLONOSCOPY  2010   Dr. Jamal Collin  . COLONOSCOPY WITH PROPOFOL N/A 05/17/2017   Procedure: COLONOSCOPY WITH PROPOFOL;  Surgeon: Manya Silvas, MD;  Location: [facility] ENDOSCOPY;  Service: Endoscopy;  Laterality: N/A;  . DILATION AND CURETTAGE OF UTERUS    . ESOPHAGOGASTRODUODENOSCOPY (EGD) WITH PROPOFOL N/A 05/17/2017   Procedure: ESOPHAGOGASTRODUODENOSCOPY (EGD) WITH PROPOFOL;  Surgeon: Manya Silvas, MD;  Location: [facility] ENDOSCOPY;  Service: Endoscopy;  Laterality: N/A;  . LAPAROTOMY FOR REMOVAL TUMOR LUMBAR PLEXES    . Leg stent  2011  . MOLE REMOVAL   2013   15 removed  . POSTERIOR CERVICAL LAMINECTOMY Left 10/15/2015   Procedure: Left Cervical four- five Hemilaminectomy/Remove mass;  Surgeon: Leeroy Cha, MD;  Location: [place] NEURO ORS;  Service: Neurosurgery;  Laterality: Left;  Left C4-5 Hemilaminectomy/Remove mass    Family History  Problem Relation Age of Onset  . Cancer Father        Prostate  . Diabetes Father   . Cerebrovascular Accident Father   . Hypertension Mother   . AAA (abdominal aortic aneurysm) Mother   . Hyperlipidemia Other        Parent  . Miscarriages / Stillbirths Other        Parent  . Hypertension Other        parent  . Heart disease Other        Parent  . Breast cancer Maternal Aunt 20    SOCIAL HX: reviewed.    Current Outpatient Medications:  .  aspirin (BAYER ASPIRIN) 325 MG tablet, Take 325 mg by mouth daily., Disp: , Rfl:  .  carvedilol (COREG) 12.5 MG tablet, TAKE 1 TABLET(12.5 MG) BY MOUTH TWICE DAILY WITH MEALS, Disp: , Rfl:  .  Cholecalciferol (VITAMIN D-1000 MAX ST) 1000 units tablet, Take 1,000 Units by mouth daily. , Disp: , Rfl:  .  hydrALAZINE (APRESOLINE) 10 MG tablet, Take 1 tablet (10 mg total) by mouth 3 (three) times daily., Disp: 90 tablet, Rfl: 1 .  letrozole (FEMARA) 2.5 MG tablet, TAKE 1 TABLET BY MOUTH EVERY DAY, Disp: 90 tablet, Rfl: 4 .  losartan-hydrochlorothiazide (HYZAAR) 100-25 MG tablet, Take 1 tablet by mouth daily., Disp: 90 tablet, Rfl: 0 .  meclizine (ANTIVERT) 25 MG tablet, Take 1 tablet (25 mg total) by mouth 3 (three) times daily as needed for dizziness or nausea., Disp: 20 tablet, Rfl: 0 .  metFORMIN (GLUCOPHAGE-XR) 500 MG 24 hr tablet, Take 1 tablet (500 mg total) by mouth 2 (two) times daily with a meal., Disp: 60 tablet, Rfl: 2 .  omeprazole (PRILOSEC) 40 MG capsule, Take by mouth., Disp: , Rfl:  .  PROAIR HFA 108 (90 Base) MCG/ACT inhaler, INL 2 INHALATIONS ITL Q 6 H PRF WHZ, Disp: , Rfl: 1 .  rosuvastatin (CRESTOR) 20 MG tablet, Take 1 tablet (20 mg total) by  mouth daily., Disp: 90 tablet, Rfl: 1 .  vitamin E 400 UNIT capsule, Take 400 Units by mouth daily., Disp: , Rfl:   EXAM:  GENERAL: alert.  Sounds to be in no acute distress.  Answering questions appropriately.    PSYCH/NEURO: pleasant and cooperative, no obvious depression or anxiety, speech and thought processing grossly intact  ASSESSMENT AND PLAN:  Discussed the following assessment and plan:  Abnormal liver function test  Ankle swelling, unspecified laterality  Malignant  neoplasm of left female breast, unspecified estrogen receptor status, unspecified site of breast ([place])  Coronary artery disease involving native coronary artery of native heart with angina pectoris ([place])  Bilateral carotid artery stenosis  Chronic obstructive pulmonary disease, unspecified COPD type ([place])  Diabetes mellitus with cardiac complication ([place])  Gastroesophageal reflux disease without esophagitis  Hypercholesterolemia  Essential hypertension  Light headedness  Peripheral vascular disease ([facility])  Thyroid nodule  Abnormal liver function test Saw GI.  Fatty liver on CT.  Have discussed diet and exercise.  Follow liver function tests.    Ankle swelling No significant swelling now.  Better in am.  Follow.    Breast cancer ([place]) Followed by Dr Bary Castilla.  Scheduled for f/u mammogram 10/26/18.    CAD (coronary artery disease) Followed by cardiology.  Continue risk factor modification.    Carotid artery stenosis Followed by [facility].  Last evaluated 04/2018.  Recommended f/u in 2 years.    COPD (chronic obstructive pulmonary disease) ([place]) Followed by Dr Raul Del.  Breathing stable.    Diabetes mellitus with cardiac complication (HCC) Z6W elevated - 7.7 on recent labs. Discussed low carb diet and exercise.  Increase metformin to bid.  Follow met b and a1c.    GERD (gastroesophageal reflux disease) Has been evaluated by GI.  Controlled on current medication.    Hypercholesterolemia On  crestor.  Low cholesterol diet and exercise.  Follow lipid panel and liver function tests.    Hypertension Blood pressure elevated.  Add hydralazine.  Follow pressures.  Schedule f/u visit soon to reassess.  She wants to treat blood pressure and sugar and see if symptoms improve.  Desires no further intervention.  Follow.    Light headedness Describes some light headedness/dizziness as outlined.  Treat blood pressure and sugar as outlined.  Wants to hold on any further w/up at this point.  Follow.    Peripheral vascular disease ([place]) Followed by [facility].  Last evaluated 04/2018.  Recommended f/u in 2 years.    Thyroid nodule Evaluated by Dr Gabriel Carina.  Biopsy negative.  Follow tsh.      I discussed the assessment and treatment plan with the patient. The patient was provided an opportunity to ask questions and all were answered. The patient agreed with the plan and demonstrated an understanding of the instructions.   The patient was advised to call back or seek an in-person evaluation if the symptoms worsen or if the condition fails to improve as anticipated.  I provided 30 minutes of non-face-to-face time during this encounter.   Einar Pheasant, MD

## 2018-09-24 ENCOUNTER — Encounter: Payer: Self-pay | Admitting: Internal Medicine

## 2018-09-24 NOTE — Assessment & Plan Note (Signed)
Followed by cardiology.  Continue risk factor modification.   

## 2018-09-24 NOTE — Assessment & Plan Note (Signed)
Followed by [facility].  Last evaluated 04/2018.  Recommended f/u in 2 years.

## 2018-09-24 NOTE — Assessment & Plan Note (Signed)
Saw GI.  Fatty liver on CT.  Have discussed diet and exercise.  Follow liver function tests.

## 2018-09-24 NOTE — Assessment & Plan Note (Signed)
Has been evaluated by GI.  Controlled on current medication.

## 2018-09-24 NOTE — Assessment & Plan Note (Signed)
Evaluated by Dr Solum.  Biopsy negative.  Follow tsh.   

## 2018-09-24 NOTE — Assessment & Plan Note (Signed)
Followed by Dr Bary Castilla.  Scheduled for f/u mammogram 10/26/18.

## 2018-09-24 NOTE — Assessment & Plan Note (Signed)
Blood pressure elevated.  Add hydralazine.  Follow pressures.  Schedule f/u visit soon to reassess.  She wants to treat blood pressure and sugar and see if symptoms improve.  Desires no further intervention.  Follow.

## 2018-09-24 NOTE — Assessment & Plan Note (Signed)
On crestor.  Low cholesterol diet and exercise.  Follow lipid panel and liver function tests.   

## 2018-09-24 NOTE — Assessment & Plan Note (Signed)
Describes some light headedness/dizziness as outlined.  Treat blood pressure and sugar as outlined.  Wants to hold on any further w/up at this point.  Follow.

## 2018-09-24 NOTE — Assessment & Plan Note (Signed)
a1c elevated - 7.7 on recent labs. Discussed low carb diet and exercise.  Increase metformin to bid.  Follow met b and a1c.

## 2018-09-24 NOTE — Assessment & Plan Note (Signed)
No significant swelling now.  Better in am.  Follow.

## 2018-09-24 NOTE — Assessment & Plan Note (Signed)
Followed by Dr Fleming.  Breathing stable.   

## 2018-09-27 ENCOUNTER — Other Ambulatory Visit: Payer: Self-pay

## 2018-09-27 ENCOUNTER — Encounter: Payer: Self-pay | Admitting: Internal Medicine

## 2018-09-27 ENCOUNTER — Ambulatory Visit (INDEPENDENT_AMBULATORY_CARE_PROVIDER_SITE_OTHER): Payer: Medicare Other | Admitting: Internal Medicine

## 2018-09-27 DIAGNOSIS — I25119 Atherosclerotic heart disease of native coronary artery with unspecified angina pectoris: Secondary | ICD-10-CM

## 2018-09-27 DIAGNOSIS — E1159 Type 2 diabetes mellitus with other circulatory complications: Secondary | ICD-10-CM | POA: Diagnosis not present

## 2018-09-27 DIAGNOSIS — I10 Essential (primary) hypertension: Secondary | ICD-10-CM

## 2018-09-27 DIAGNOSIS — R7989 Other specified abnormal findings of blood chemistry: Secondary | ICD-10-CM

## 2018-09-27 DIAGNOSIS — J449 Chronic obstructive pulmonary disease, unspecified: Secondary | ICD-10-CM | POA: Diagnosis not present

## 2018-09-27 DIAGNOSIS — R945 Abnormal results of liver function studies: Secondary | ICD-10-CM

## 2018-09-27 DIAGNOSIS — R42 Dizziness and giddiness: Secondary | ICD-10-CM

## 2018-09-27 DIAGNOSIS — E7800 Pure hypercholesterolemia, unspecified: Secondary | ICD-10-CM

## 2018-09-27 DIAGNOSIS — K219 Gastro-esophageal reflux disease without esophagitis: Secondary | ICD-10-CM | POA: Diagnosis not present

## 2018-09-27 NOTE — Progress Notes (Signed)
[name], female   DOB: 04-Dec-1945, 73 y.o.   MRN: [id number]   Virtual Visit via Telephone Note  This visit type was conducted due to national recommendations for restrictions regarding the COVID-19 pandemic (e.g. social distancing).  This format is felt to be most appropriate for this patient at this time.  All issues noted in this document were discussed and addressed.  No physical exam was performed (except for noted visual exam findings with Video Visits).   I connected with [name] by telephone and verified that I am speaking with the correct person using two identifiers. Location patient: home Location provider: work Persons participating in the telephone visit: patient, provider  I discussed the limitations, risks, security and privacy concerns of performing an evaluation and management service by telephone and the availability of in person appointments.  The patient expressed understanding and agreed to proceed.   Reason for visit: follow up appt   HPI: Had a telephone visit with her last week.  Blood pressure elevated.  Sugars increased and was complaining of dizziness.  Metformin was increased.  Hydralazine was added to her medication regimen.  See last note for details.  She reports she is feeling better.  Dizziness is better.  No headache.  Blood pressure better - trending down.  No chest pain reported.  No sob. No abdominal pain.  Bowels moving.  AM sugars averaging 120-140s and pm sugars 130-160.  Does not feel weak - like she did previously.  Overall feels better.     ROS: See pertinent positives and negatives per HPI.  Past Medical History:  Diagnosis Date  . Arthritis   . Breast cancer, left ([place]) 2016   LT LUMPECTOMY - TI, NO, MO - IDC, ER/PR pos, Her 2 neg, Rad tx's.   . Carotid artery occlusion   . Carotid artery occlusion   . 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 CHF (congestive heart failure) ([place])   . COPD  (chronic obstructive pulmonary disease) ([place])   . Coronary artery disease   . Depression   . Diffuse cystic mastopathy 2013  . DVT (deep venous thrombosis) ([place])   . Dyspnea   . Elevated TSH   . Family history of adverse reaction to anesthesia    Pt stated that son had a seizure with a combination of anesthesia and pain medication."  . Fatty liver   . GERD (gastroesophageal reflux disease)   . Glaucoma   . High cholesterol   . History of chicken pox   . Hyperglycemia   . Hyperlipidemia   . Hypertension   . LVH (left ventricular hypertrophy)   . PAC (premature atrial contraction)   . Palpitations   . Peripheral vascular disease ([place])   . Personal history of radiation therapy 2016   BREAST CA  . Pneumonia March 2014  . Skin cancer 2013  . Sleep apnea    wears CPAP set at 2.5  . Wears glasses     Past Surgical History:  Procedure Laterality Date  . BACK SURGERY  1986   ruptured disc  . BREAST BIOPSY Left 2008   NEG  . BREAST BIOPSY Left 08-20-14   POS  . BREAST BIOPSY Right 2007   NEG  . BREAST EXCISIONAL BIOPSY Left 1984   NEG  . 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 BREAST SURGERY Left 09/05/2014   T1a,N0; 5 mm  ER/PR positive. HER2 negative.  Wide excision  with SLN biopsy.  MammoSite radiation  . CARPAL TUNNEL RELEASE    . CHOLECYSTECTOMY  2006  . COLONOSCOPY  2010   Dr. Jamal Collin  . COLONOSCOPY WITH PROPOFOL N/A 05/17/2017   Procedure: COLONOSCOPY WITH PROPOFOL;  Surgeon: Manya Silvas, MD;  Location: [facility] ENDOSCOPY;  Service: Endoscopy;  Laterality: N/A;  . DILATION AND CURETTAGE OF UTERUS    . ESOPHAGOGASTRODUODENOSCOPY (EGD) WITH PROPOFOL N/A 05/17/2017   Procedure: ESOPHAGOGASTRODUODENOSCOPY (EGD) WITH PROPOFOL;  Surgeon: Manya Silvas, MD;  [facility];  Service: Endoscopy;  Laterality: N/A;  . LAPAROTOMY FOR REMOVAL TUMOR LUMBAR PLEXES    . Leg stent  2011  . MOLE REMOVAL  2013   15 removed  . POSTERIOR CERVICAL LAMINECTOMY Left  10/15/2015   Procedure: Left Cervical four- five Hemilaminectomy/Remove mass;  Surgeon: Leeroy Cha, MD;  Location: [place] NEURO ORS;  Service: Neurosurgery;  Laterality: Left;  Left C4-5 Hemilaminectomy/Remove mass    Family History  Problem Relation Age of Onset  . Cancer Father        Prostate  . Diabetes Father   . Cerebrovascular Accident Father   . Hypertension Mother   . AAA (abdominal aortic aneurysm) Mother   . Hyperlipidemia Other        Parent  . Miscarriages / Stillbirths Other        Parent  . Hypertension Other        parent  . Heart disease Other        Parent  . Breast cancer Maternal Aunt 77    SOCIAL HX: reviewed.    Current Outpatient Medications:  .  aspirin (BAYER ASPIRIN) 325 MG tablet, Take 325 mg by mouth daily., Disp: , Rfl:  .  carvedilol (COREG) 12.5 MG tablet, TAKE 1 TABLET(12.5 MG) BY MOUTH TWICE DAILY WITH MEALS, Disp: , Rfl:  .  Cholecalciferol (VITAMIN D-1000 MAX ST) 1000 units tablet, Take 1,000 Units by mouth daily. , Disp: , Rfl:  .  hydrALAZINE (APRESOLINE) 10 MG tablet, Take 1 tablet (10 mg total) by mouth 3 (three) times daily., Disp: 90 tablet, Rfl: 1 .  letrozole (FEMARA) 2.5 MG tablet, TAKE 1 TABLET BY MOUTH EVERY DAY, Disp: 90 tablet, Rfl: 4 .  losartan-hydrochlorothiazide (HYZAAR) 100-25 MG tablet, Take 1 tablet by mouth daily., Disp: 90 tablet, Rfl: 0 .  meclizine (ANTIVERT) 25 MG tablet, Take 1 tablet (25 mg total) by mouth 3 (three) times daily as needed for dizziness or nausea., Disp: 20 tablet, Rfl: 0 .  metFORMIN (GLUCOPHAGE-XR) 500 MG 24 hr tablet, Take 1 tablet (500 mg total) by mouth 2 (two) times daily with a meal., Disp: 60 tablet, Rfl: 2 .  omeprazole (PRILOSEC) 40 MG capsule, Take by mouth., Disp: , Rfl:  .  PROAIR HFA 108 (90 Base) MCG/ACT inhaler, INL 2 INHALATIONS ITL Q 6 H PRF WHZ, Disp: , Rfl: 1 .  rosuvastatin (CRESTOR) 20 MG tablet, Take 1 tablet (20 mg total) by mouth daily., Disp: 90 tablet, Rfl: 1 .  vitamin E 400  UNIT capsule, Take 400 Units by mouth daily., Disp: , Rfl:   EXAM:  VITALS per patient if applicable: 867/67  GENERAL: alert.  Answering questions appropriately.  Sounds to be in no acute distress.    PSYCH/NEURO: pleasant and cooperative, no obvious depression or anxiety, speech and thought processing grossly intact  ASSESSMENT AND PLAN:  Discussed the following assessment and plan:  Abnormal liver function test  Coronary artery disease involving native coronary artery of  native heart with angina pectoris (HCC)  Chronic obstructive pulmonary disease, unspecified COPD type ([place])  Diabetes mellitus with cardiac complication (HCC)  Gastroesophageal reflux disease without esophagitis  Hypercholesterolemia  Essential hypertension  Light headedness  Abnormal liver function test Saw GI.  Fatty liver on CT.  Have discussed diet and exercise.  Follow liver function tests.    CAD (coronary artery disease) Followed by cardiology.  Continue risk factor modification.    COPD (chronic obstructive pulmonary disease) ([place]) Followed by Dr Raul Del.  Breathing stable.    Diabetes mellitus with cardiac complication (HCC) On increased dose of metformin.  Tolerating.  Sugars improving.  Feels better.  Follow met b and a1c.   GERD (gastroesophageal reflux disease) Controlled on current medication.    Hypercholesterolemia On crestor.  Low cholesterol diet and exercise.  Follow lipid panel and liver function tests.    Hypertension Blood pressure doing better.  Recent checks 130s/70s.  Continue current medications.  Follow pressures.  Follow metabolic panel.    Light headedness Light headedness and dizziness improved.  Not an issue now.  Follow.      I discussed the assessment and treatment plan with the patient. The patient was provided an opportunity to ask questions and all were answered. The patient agreed with the plan and demonstrated an understanding of the instructions.   The  patient was advised to call back or seek an in-person evaluation if the symptoms worsen or if the condition fails to improve as anticipated.  I provided 20 minutes of non-face-to-face time during this encounter.   Einar Pheasant, MD

## 2018-10-01 ENCOUNTER — Encounter: Payer: Self-pay | Admitting: Internal Medicine

## 2018-10-01 NOTE — Assessment & Plan Note (Signed)
Followed by Dr Fleming.  Breathing stable.   

## 2018-10-01 NOTE — Assessment & Plan Note (Signed)
Followed by cardiology.  Continue risk factor modification.   

## 2018-10-01 NOTE — Assessment & Plan Note (Signed)
On increased dose of metformin.  Tolerating.  Sugars improving.  Feels better.  Follow met b and a1c.

## 2018-10-01 NOTE — Assessment & Plan Note (Signed)
Controlled on current medication ?

## 2018-10-01 NOTE — Assessment & Plan Note (Signed)
Light headedness and dizziness improved.  Not an issue now.  Follow.

## 2018-10-01 NOTE — Assessment & Plan Note (Signed)
Saw GI.  Fatty liver on CT.  Have discussed diet and exercise.  Follow liver function tests.

## 2018-10-01 NOTE — Assessment & Plan Note (Signed)
On crestor.  Low cholesterol diet and exercise.  Follow lipid panel and liver function tests.   

## 2018-10-01 NOTE — Assessment & Plan Note (Signed)
Blood pressure doing better.  Recent checks 130s/70s.  Continue current medications.  Follow pressures.  Follow metabolic panel.

## 2018-10-06 ENCOUNTER — Other Ambulatory Visit: Payer: Self-pay | Admitting: Internal Medicine

## 2018-10-19 ENCOUNTER — Telehealth: Payer: Self-pay

## 2018-10-19 ENCOUNTER — Other Ambulatory Visit: Payer: Self-pay

## 2018-10-19 MED ORDER — METFORMIN HCL 500 MG PO TABS: 500.0000 mg | ORAL_TABLET | Freq: Every day | ORAL | 1 refills | Status: DC

## 2018-10-19 NOTE — Telephone Encounter (Signed)
Confirmed with pt that she is not feeling sick on her stomach or vomiting. She increased her metformin at last appointment she has been having loose stools about 30 min-1hr after she eats. No other acute symptoms noted. Pt is eating and drinking as she would normally. She stated she is feeling fine. Patient is wondering if we should cut back on her metformin.

## 2018-10-19 NOTE — Telephone Encounter (Signed)
Copied from [place] [id number]. Topic: Quick Communication - Rx Refill/Question >> Oct 19, 2018 10:58 AM Erick Blinks wrote: Medication: metFORMIN (GLUCOPHAGE-XR) 500 MG 24 hr tablet [[id number]] -Pt called to report that her bowels are very loose, BS is 135. Yesterday, stool is green. Cant keep food down. Please advise  Best Contact: [phone], VM is okay

## 2018-10-19 NOTE — Telephone Encounter (Signed)
Would like to change her metformin back to the regular metformin 500mg  due to the recall.  I am ok if she decreases to one per day and see if GI symptoms resolve. I want her to call with update and let me know if GI symptoms do not resolve.  Also, check and record sugars and send in readings over the next two weeks.

## 2018-10-19 NOTE — Telephone Encounter (Signed)
Patient is aware and will record sugars and call with update. Rx sent in for metformin 500 mg q day

## 2018-10-26 ENCOUNTER — Ambulatory Visit
Admission: RE | Admit: 2018-10-26 | Discharge: 2018-10-26 | Disposition: A | Discharge status: Home / Self Care | Payer: Medicare Other | Source: Ambulatory Visit | Attending: General Surgery | Admitting: General Surgery

## 2018-10-26 ENCOUNTER — Other Ambulatory Visit: Payer: Self-pay

## 2018-10-26 DIAGNOSIS — Z170 Estrogen receptor positive status [ER+]: Secondary | ICD-10-CM

## 2018-10-26 DIAGNOSIS — R928 Other abnormal and inconclusive findings on diagnostic imaging of breast: Secondary | ICD-10-CM | POA: Diagnosis not present

## 2018-10-26 DIAGNOSIS — C50512 Malignant neoplasm of lower-outer quadrant of left female breast: Secondary | ICD-10-CM

## 2018-10-26 DIAGNOSIS — Z853 Personal history of malignant neoplasm of breast: Secondary | ICD-10-CM | POA: Diagnosis not present

## 2018-11-01 ENCOUNTER — Encounter: Payer: Self-pay | Admitting: General Surgery

## 2018-11-01 ENCOUNTER — Ambulatory Visit (INDEPENDENT_AMBULATORY_CARE_PROVIDER_SITE_OTHER): Payer: Medicare Other | Admitting: General Surgery

## 2018-11-01 ENCOUNTER — Other Ambulatory Visit: Payer: Self-pay

## 2018-11-01 VITALS — BP 148/87 | HR 74 | Temp 97.9°F | Resp 18 | Ht 64.0 in | Wt 210.0 lb

## 2018-11-01 DIAGNOSIS — C50512 Malignant neoplasm of lower-outer quadrant of left female breast: Secondary | ICD-10-CM

## 2018-11-01 DIAGNOSIS — Z170 Estrogen receptor positive status [ER+]: Secondary | ICD-10-CM

## 2018-11-01 NOTE — Patient Instructions (Signed)
We will contact you next June 2021 to schedule your diagnostic mammogram and follow up with Dr.Byrnett.

## 2018-11-01 NOTE — Progress Notes (Signed)
[name], female   DOB: 08/25/1945, 73 y.o.   MRN: [id number]  Chief Complaint  Patient presents with  . Follow-up    1 year f/u diagnostic mammogram    HPI Lindsey Bentley is a 73 y.o. female.  Here today for follow up diagnostic bilateral mammogram. No complaints.  HPI  Past Medical History:  Diagnosis Date  . Arthritis   . Breast cancer (HCC)   . Breast cancer, left (HCC) 2016   LT LUMPECTOMY - TI, NO, MO - IDC, ER/PR pos, Her 2 neg, Rad tx's.   . Carotid artery occlusion   . Carotid artery occlusion   . 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 CHF (congestive heart failure) (HCC)   . COPD (chronic obstructive pulmonary disease) (HCC)   . Coronary artery disease   . Depression   . Diffuse cystic mastopathy 2013  . DVT (deep venous thrombosis) (HCC)   . Dyspnea   . Elevated TSH   . Family history of adverse reaction to anesthesia    Pt stated that son had a seizure with a combination of anesthesia and pain medication."  . Fatty liver   . GERD (gastroesophageal reflux disease)   . Glaucoma   . High cholesterol   . History of chicken pox   . Hyperglycemia   . Hyperlipidemia   . Hypertension   . LVH (left ventricular hypertrophy)   . PAC (premature atrial contraction)   . Palpitations   . Peripheral vascular disease (HCC)   . Personal history of radiation therapy 2016   BREAST CA  . Pneumonia March 2014  . Skin cancer 2013  . Sleep apnea    wears CPAP set at 2.5  . Wears glasses     Past Surgical History:  Procedure Laterality Date  . BACK SURGERY  1986   ruptured disc  . BREAST BIOPSY Left 2008   NEG  . BREAST BIOPSY Left 08-20-14   POS  . BREAST BIOPSY Right 2007   NEG  . BREAST EXCISIONAL BIOPSY Left 1984   NEG  . 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 BREAST SURGERY Left 09/05/2014   T1a,N0; 5 mm  ER/PR positive. HER2 negative.  Wide excision with SLN biopsy.  MammoSite radiation  . CARPAL TUNNEL RELEASE     . CHOLECYSTECTOMY  2006  . COLONOSCOPY  2010   Dr. Sankar  . COLONOSCOPY WITH PROPOFOL N/A 05/17/2017   Procedure: COLONOSCOPY WITH PROPOFOL;  Surgeon: Elliott, Robert T, MD;  Location: [facility] ENDOSCOPY;  Service: Endoscopy;  Laterality: N/A;  . DILATION AND CURETTAGE OF UTERUS    . ESOPHAGOGASTRODUODENOSCOPY (EGD) WITH PROPOFOL N/A 05/17/2017   Procedure: ESOPHAGOGASTRODUODENOSCOPY (EGD) WITH PROPOFOL;  Surgeon: Elliott, Robert T, MD;  Location: [facility] ENDOSCOPY;  Service: Endoscopy;  Laterality: N/A;  . LAPAROTOMY FOR REMOVAL TUMOR LUMBAR PLEXES    . Leg stent  2011  . MOLE REMOVAL  2013   15 removed  . POSTERIOR CERVICAL LAMINECTOMY Left 10/15/2015   Procedure: Left Cervical four- five Hemilaminectomy/Remove mass;  Surgeon: Ernesto Botero, MD;  Location: [facility];  Service: Neurosurgery;  Laterality: Left;  Left C4-5 Hemilaminectomy/Remove mass    Family History  Problem Relation Age of Onset  . Cancer Father        Prostate  . Diabetes Father   . Cerebrovascular Accident Father   . Hypertension Mother   . AAA (abdominal aortic aneurysm) Mother   .   Hyperlipidemia Other        Parent  . Miscarriages / Stillbirths Other        Parent  . Hypertension Other        parent  . Heart disease Other        Parent  . Breast cancer Maternal Aunt 72    Social History Social History   Tobacco Use  . Smoking status: Never Smoker  . Smokeless tobacco: Never Used  Substance Use Topics  . Alcohol use: No    Alcohol/week: 0.0 standard drinks  . Drug use: No    Allergies  Allergen Reactions  . Codeine Nausea And Vomiting  . Prednisone Swelling  . Statins Other (See Comments)    Muscle Pain, can take Crestor  . Tape Itching and Rash    Please use "paper" tape Please use "paper" tape    Current Outpatient Medications  Medication Sig Dispense Refill  . aspirin (BAYER ASPIRIN) 325 MG tablet Take 325 mg by mouth daily.    . carvedilol (COREG) 12.5 MG tablet TAKE 1 TABLET(12.5 MG)  BY MOUTH TWICE DAILY WITH MEALS    . Cholecalciferol (VITAMIN D-1000 MAX ST) 1000 units tablet Take 1,000 Units by mouth daily.     . hydrALAZINE (APRESOLINE) 10 MG tablet Take 1 tablet (10 mg total) by mouth 3 (three) times daily. 90 tablet 1  . letrozole (FEMARA) 2.5 MG tablet TAKE 1 TABLET BY MOUTH EVERY DAY 90 tablet 4  . losartan-hydrochlorothiazide (HYZAAR) 100-25 MG tablet Take 1 tablet by mouth daily. 90 tablet 0  . meclizine (ANTIVERT) 25 MG tablet Take 1 tablet (25 mg total) by mouth 3 (three) times daily as needed for dizziness or nausea. 20 tablet 0  . metFORMIN (GLUCOPHAGE) 500 MG tablet Take 1 tablet (500 mg total) by mouth daily with breakfast. 90 tablet 1  . omeprazole (PRILOSEC) 40 MG capsule Take by mouth.    . PROAIR HFA 108 (90 Base) MCG/ACT inhaler INL 2 INHALATIONS ITL Q 6 H PRF WHZ  1  . rosuvastatin (CRESTOR) 20 MG tablet TAKE 1 TABLET BY MOUTH DAILY 90 tablet 1  . vitamin E 400 UNIT capsule Take 400 Units by mouth daily.     No current facility-administered medications for this visit.     Review of Systems Review of Systems  Constitutional: Negative.   Respiratory: Negative.   Cardiovascular: Negative.     Blood pressure (!) 148/87, pulse 74, temperature 97.9 F (36.6 C), temperature source Temporal, resp. rate 18, height 5' 4" (1.626 m), weight 210 lb (95.3 kg), SpO2 98 %.  Physical Exam Physical Exam Constitutional:      Appearance: She is well-developed.  Eyes:     Conjunctiva/sclera: Conjunctivae normal.  Neck:     Musculoskeletal: Normal range of motion.  Cardiovascular:     Rate and Rhythm: Normal rate and regular rhythm.     Heart sounds: Normal heart sounds.  Pulmonary:     Effort: Pulmonary effort is normal.     Breath sounds: Normal breath sounds.  Chest:     Breasts:        Right: Normal.     Lymphadenopathy:     Upper Body:     Right upper body: No supraclavicular or axillary adenopathy.     Left upper body: No supraclavicular or  axillary adenopathy.  Skin:    General: Skin is warm and dry.  Neurological:     Mental Status: She is alert and oriented to   person, place, and time.     Data Reviewed Bilateral diagnostic mammograms dated October 26, 2018 were independently reviewed reviewed.  Postsurgical changes noted in the left lower outer quadrant consistent with clinical exam.  BI-RADS-2.  Assessment No evidence of recurrent cancer.  Good tolerance of antiestrogen therapy.  Plan  We will contact you next June 2021 to schedule your diagnostic mammogram and follow up with Dr.Byrnett.   The patient brought up the possibility of having her colonoscopy in 2022 completed through this office.  She had had several polyps removed from the right colon at the time of her 2019 exam with Dr. Elliott.  He is also been seeing her for a fatty liver with a mild elevation of the serum transaminases.  Depending upon the involvement of GI at the time of the anticipated study in January 2022 I be glad to assist if needed.   HPI, Physical Exam, Assessment and Plan have been scribed under the direction and in the presence of Jeffrey W. Byrnett, MD. Sheila Childers, CMA   I have completed the exam and reviewed the above documentation for accuracy and completeness.  I agree with the above.  [employer] has been used and any errors in dictation or transcription are unintentional.  Jeffrey Byrnett, M.D., F.A.C.S.  Jeffrey W Byrnett 11/02/2018, 7:51 PM   

## 2018-11-03 ENCOUNTER — Telehealth: Payer: Self-pay

## 2018-11-03 NOTE — Telephone Encounter (Signed)
Copied from [place] [id number]. Topic: General - Other >> Nov 03, 2018 10:27 AM Leward Quan A wrote: Reason for CRM: Patient called to say that her blood sugar readings were between 135 and 145 for two weeks fasting and also that bowels are still loose about an hour after taking the Metformin. Patient states that she don't have to eat anything for her to have the loose stools. Ph# [phone]

## 2018-11-03 NOTE — Telephone Encounter (Signed)
Called and spoke to patient.  Patient said that she has 1 to 2 bowel movements a day and stool is loose.  Patient said that bowel movements have decreased from several loose bowel movements a day to 1 to 2 a day since PCP changed metformin form extended release to regular metformin.  Patient denies having any other symptoms.  Patient said that she has an upcoming appointment with PCP on 11/14/18.  Informed pt that notes will be forwarded to PCP for review.

## 2018-11-04 NOTE — Telephone Encounter (Signed)
Make sure she is not having any other acute issues.  Also, she can try probiotic daily.  Also adding fiber may help to bulk up the stool. Let me know if she needs something prior to her appt

## 2018-11-04 NOTE — Telephone Encounter (Signed)
This is just an FYI

## 2018-11-04 NOTE — Telephone Encounter (Signed)
Pt advised of below and is going to add fiber and probiotic. Will f/u at appt

## 2018-11-08 DIAGNOSIS — G4733 Obstructive sleep apnea (adult) (pediatric): Secondary | ICD-10-CM | POA: Diagnosis not present

## 2018-11-14 ENCOUNTER — Encounter: Payer: Self-pay | Admitting: Internal Medicine

## 2018-11-14 ENCOUNTER — Ambulatory Visit (INDEPENDENT_AMBULATORY_CARE_PROVIDER_SITE_OTHER): Payer: Medicare Other | Admitting: Internal Medicine

## 2018-11-14 ENCOUNTER — Other Ambulatory Visit: Payer: Self-pay

## 2018-11-14 DIAGNOSIS — C50912 Malignant neoplasm of unspecified site of left female breast: Secondary | ICD-10-CM

## 2018-11-14 DIAGNOSIS — I25119 Atherosclerotic heart disease of native coronary artery with unspecified angina pectoris: Secondary | ICD-10-CM | POA: Diagnosis not present

## 2018-11-14 DIAGNOSIS — R7989 Other specified abnormal findings of blood chemistry: Secondary | ICD-10-CM

## 2018-11-14 DIAGNOSIS — J449 Chronic obstructive pulmonary disease, unspecified: Secondary | ICD-10-CM | POA: Diagnosis not present

## 2018-11-14 DIAGNOSIS — I6523 Occlusion and stenosis of bilateral carotid arteries: Secondary | ICD-10-CM | POA: Diagnosis not present

## 2018-11-14 DIAGNOSIS — E7800 Pure hypercholesterolemia, unspecified: Secondary | ICD-10-CM

## 2018-11-14 DIAGNOSIS — R945 Abnormal results of liver function studies: Secondary | ICD-10-CM

## 2018-11-14 DIAGNOSIS — I10 Essential (primary) hypertension: Secondary | ICD-10-CM

## 2018-11-14 DIAGNOSIS — E1159 Type 2 diabetes mellitus with other circulatory complications: Secondary | ICD-10-CM

## 2018-11-14 NOTE — Progress Notes (Signed)
[name], female   DOB: 09/12/1945, 73 y.o.   MRN: [id number]   Virtual Visit via telephone Note  This visit type was conducted due to national recommendations for restrictions regarding the COVID-19 pandemic (e.g. social distancing).  This format is felt to be most appropriate for this patient at this time.  All issues noted in this document were discussed and addressed.  No physical exam was performed (except for noted visual exam findings with Video Visits).   I connected with Katha Hamming by telephone and verified that I am speaking with the correct person using two identifiers. Location patient: home Location provider: work or home office Persons participating in the virtual visit: patient, provider  I discussed the limitations, risks, security and privacy concerns of performing an evaluation and management service by telephone and the availability of in person appointments. The patient expressed understanding and agreed to proceed.   Reason for visit: scheduled follow up  HPI: She reports she is doing better.  Feels better.  Previously having loose stool.  Taking fiber and probiotic.  Bowels better.  No chest pain.  Breathing stable.  Denies any sob.  No acid reflux.  No abdominal pain.  States sugars doing well.  AM sugars averaging 135-145.  131 this am.  Blood pressures doing well.  Blood pressures averaging 120-130s/60s.  Seeing eye MD.  Due f/u with [facility] in [place].     ROS: See pertinent positives and negatives per HPI.  Past Medical History:  Diagnosis Date  . Arthritis   . Breast cancer ([place])   . Breast cancer, left ([place]) 2016   LT LUMPECTOMY - TI, NO, MO - IDC, ER/PR pos, Her 2 neg, Rad tx's.   . Carotid artery occlusion   . Carotid artery occlusion   . 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 CHF (congestive heart failure) ([place])   . COPD (chronic obstructive pulmonary disease) ([place])   . Coronary artery disease   .  Depression   . Diffuse cystic mastopathy 2013  . DVT (deep venous thrombosis) ([place])   . Dyspnea   . Elevated TSH   . Family history of adverse reaction to anesthesia    Pt stated that son had a seizure with a combination of anesthesia and pain medication."  . Fatty liver   . GERD (gastroesophageal reflux disease)   . Glaucoma   . High cholesterol   . History of chicken pox   . Hyperglycemia   . Hyperlipidemia   . Hypertension   . LVH (left ventricular hypertrophy)   . PAC (premature atrial contraction)   . Palpitations   . Peripheral vascular disease ([place])   . Personal history of radiation therapy 2016   BREAST CA  . Pneumonia March 2014  . Skin cancer 2013  . Sleep apnea    wears CPAP set at 2.5  . Wears glasses     Past Surgical History:  Procedure Laterality Date  . BACK SURGERY  1986   ruptured disc  . BREAST BIOPSY Left 2008   NEG  . BREAST BIOPSY Left 08-20-14   POS  . BREAST BIOPSY Right 2007   NEG  . BREAST EXCISIONAL BIOPSY Left 1984   NEG  . 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 BREAST SURGERY Left 09/05/2014   T1a,N0; 5 mm  ER/PR positive. HER2 negative.  Wide excision with SLN biopsy.  MammoSite radiation  . CARPAL TUNNEL RELEASE    .  CHOLECYSTECTOMY  2006  . COLONOSCOPY  2010   Dr. Jamal Collin  . COLONOSCOPY WITH PROPOFOL N/A 05/17/2017   Procedure: COLONOSCOPY WITH PROPOFOL;  Surgeon: Manya Silvas, MD;  Location[facility] ENDOSCOPY;  Service: Endoscopy;  Laterality: N/A;  . DILATION AND CURETTAGE OF UTERUS    . ESOPHAGOGASTRODUODENOSCOPY (EGD) WITH PROPOFOL N/A 05/17/2017   Procedure: ESOPHAGOGASTRODUODENOSCOPY (EGD) WITH PROPOFOL;  Surgeon: Manya Silvas, MD;  Location[facility];  Service: Endoscopy;  Laterality: N/A;  . LAPAROTOMY FOR REMOVAL TUMOR LUMBAR PLEXES    . Leg stent  2011  . MOLE REMOVAL  2013   15 removed  . POSTERIOR CERVICAL LAMINECTOMY Left 10/15/2015   Procedure: Left Cervical four- five Hemilaminectomy/Remove mass;  Surgeon:  Leeroy Cha, MD;  Location: [place] [facility];  Service: Neurosurgery;  Laterality: Left;  Left C4-5 Hemilaminectomy/Remove mass    Family History  Problem Relation Age of Onset  . Cancer Father        Prostate  . Diabetes Father   . Cerebrovascular Accident Father   . Hypertension Mother   . AAA (abdominal aortic aneurysm) Mother   . Hyperlipidemia Other        Parent  . Miscarriages / Stillbirths Other        Parent  . Hypertension Other        parent  . Heart disease Other        Parent  . Breast cancer Maternal Aunt 63    SOCIAL HX: reviewed.    Current Outpatient Medications:  .  aspirin (BAYER ASPIRIN) 325 MG tablet, Take 325 mg by mouth daily., Disp: , Rfl:  .  carvedilol (COREG) 12.5 MG tablet, TAKE 1 TABLET(12.5 MG) BY MOUTH TWICE DAILY WITH MEALS, Disp: , Rfl:  .  Cholecalciferol (VITAMIN D-1000 MAX ST) 1000 units tablet, Take 1,000 Units by mouth daily. , Disp: , Rfl:  .  hydrALAZINE (APRESOLINE) 10 MG tablet, Take 1 tablet (10 mg total) by mouth 3 (three) times daily., Disp: 90 tablet, Rfl: 1 .  letrozole (FEMARA) 2.5 MG tablet, TAKE 1 TABLET BY MOUTH EVERY DAY, Disp: 90 tablet, Rfl: 4 .  losartan-hydrochlorothiazide (HYZAAR) 100-25 MG tablet, Take 1 tablet by mouth daily., Disp: 90 tablet, Rfl: 0 .  meclizine (ANTIVERT) 25 MG tablet, Take 1 tablet (25 mg total) by mouth 3 (three) times daily as needed for dizziness or nausea., Disp: 20 tablet, Rfl: 0 .  metFORMIN (GLUCOPHAGE) 500 MG tablet, Take 1 tablet (500 mg total) by mouth daily with breakfast., Disp: 90 tablet, Rfl: 1 .  omeprazole (PRILOSEC) 40 MG capsule, Take by mouth., Disp: , Rfl:  .  PROAIR HFA 108 (90 Base) MCG/ACT inhaler, INL 2 INHALATIONS ITL Q 6 H PRF WHZ, Disp: , Rfl: 1 .  rosuvastatin (CRESTOR) 20 MG tablet, TAKE 1 TABLET BY MOUTH DAILY, Disp: 90 tablet, Rfl: 1 .  vitamin E 400 UNIT capsule, Take 400 Units by mouth daily., Disp: , Rfl:   EXAM:  VITALS per patient if applicable: 962X-528U/13K.     GENERAL: alert.  Sounds to be in no acute distress.  Answering questions appropriately.    PSYCH/NEURO: pleasant and cooperative, no obvious depression or anxiety, speech and thought processing grossly intact  ASSESSMENT AND PLAN:  Discussed the following assessment and plan:  Abnormal liver function test Saw GI.  Fatty liver on CT.  Have discussed diet and exercise.  Follow liver function tests.    Breast cancer ([place]) Followed by Dr Bary Castilla.  Has upcoming  appt with Dr Donella Stade.    CAD (coronary artery disease) Continue risk factor modification.  Followed by cardiology.    Carotid artery stenosis Followed by [facility].  Last evaluated 04/2018.  Recommended f/u in 2 years.    COPD (chronic obstructive pulmonary disease) ([place]) Has been followed by Dr Raul Del.  Breathing stable.    Diabetes mellitus with cardiac complication (HCC) Sugars as outlined.  On metformin.  Doing better.  Follow met b and a1c.  Low carb diet and exercise.    Hypercholesterolemia On crestor.  Low cholesterol diet and exercise.  Follow lipid panel and liver function tests.    Hypertension Blood pressure doing better.  Continue current medication regimen.  Follow pressures.  Follow metabolic panel.      I discussed the assessment and treatment plan with the patient. The patient was provided an opportunity to ask questions and all were answered. The patient agreed with the plan and demonstrated an understanding of the instructions.   The patient was advised to call back or seek an in-person evaluation if the symptoms worsen or if the condition fails to improve as anticipated.  I provided 25 minutes of non-face-to-face time during this encounter.   Einar Pheasant, MD

## 2018-11-16 ENCOUNTER — Other Ambulatory Visit: Payer: Self-pay

## 2018-11-16 ENCOUNTER — Ambulatory Visit (INDEPENDENT_AMBULATORY_CARE_PROVIDER_SITE_OTHER): Payer: Medicare Other

## 2018-11-16 DIAGNOSIS — Z0000 Encounter for general adult medical examination without abnormal findings: Secondary | ICD-10-CM

## 2018-11-16 NOTE — Patient Instructions (Addendum)
Lindsey Bentley , Thank you for taking time to come for your Medicare Wellness Visit. I appreciate your ongoing commitment to your health goals. Please review the following plan we discussed and let me know if I can assist you in the future.   These are the goals we discussed: Goals      Patient Stated   . DIET - REDUCE PORTION SIZE (pt-stated)     Add more waist/core exercises to regimen       This is a list of the screening recommended for you and due dates:  Health Maintenance  Topic Date Due  . Eye exam for diabetics  08/14/1955  . Tetanus Vaccine  05/11/2014  . Hemoglobin A1C  03/17/2019  . Complete foot exam   06/21/2019  . Mammogram  10/26/2019  . Colon Cancer Screening  05/17/2020  . DEXA scan (bone density measurement)  Completed  .  Hepatitis C: One time screening is recommended by Center for Disease Control  (CDC) for  adults born from 18 through 1966.   Completed  . Pneumonia vaccines  Completed  . Flu Shot  Discontinued    This is Dr. Lupita Dawn version of a  "Low GI"  Weight loss Diet:  It has enabled many of my patients to lose up to 10 lbs per month depending on how much you restrict your carbs.   follow it carefully.  The idea behind low carb diets is that your body has to take the fat and protein in your food and turn it into an energy that is less efficient than carbohydrates, so you lose weight.    I have listed several choices at each of your 6 meal times to make it easy to shop.and plan    Remember that snack Substitutions should be equal to or less than 5 NET carbs per serving and  The only carbs in meals come from the pasta or vegetables so, they should be < 15 net carbs. Remember that carbohydrates from fiber do not count, so you can  subtract fiber grams to get the "net carbs " of any particular food item.    All of the foods can be found at grocery stores and in bulk at [employer].  The Atkins protein bars and shakes are available in more varieties at [employer],  [employer] and [place].     7 AM Breakfast:  Choose from the following:  Low carbohydrate Protein  Shakes (I recommend the [employer] AdvantEdge "Carb Control" shakes, Atkins,  Muscle Milk or Premier Protein shakes  All are < 4  carbs . My favorite is the premier protein chocolate   a scrambled egg/bacon/cheese burrito made with [employer] "carb balance" whole wheat tortilla  (this particular tortilla has only 6 net carbs, once you subtract the fiber grams  )  A slice of home made fritatta (egg based dish without a crust:  google it)    Avoid cereal and bananas, oatmeal and cream of wheat and grits. They are loaded with carbohydrates and have too few fiber grams    10 AM: high protein snack  Protein bar by Atkins  Or KIND  (the lw sugar variety,  Not all are low , under 200 cal, usually < 6 net carbs).    A stick of cheese:  Around 1 carb,  100 cal      Other so called "protein bars" and Greek yogurts tend to be loaded with carbohydrates.  Remember, in food advertising, the word "energy"  is synonymous for " carbohydrate."  Lunch:   A Sandwich using the bread choices listed below,  You  Can use any  Eggs,  lunchmeat, grilled meat or canned tuna), avocado, regular mayo/mustard  and cheese.  A Salad using blue cheese, ranch,  Goddess or vinagrette,  No croutons or "confetti" and no "candied nuts" but regular nuts OK. No "fat free": they add sugar for taste  No pretzels or chips.  Pickles and miniature sweet peppers are a good low carb alternative that provide a "crunch"   Please Note:  The bread is the only source of carbohydrate in a sandwich and  can be decreased by trying some of these alternatives to traditional loaf bread. Kristopher Oppenheim has the best selection:  [employer] makes a pita bread and a flat ("Lavash")  bread that are 50 cal and 4 net carbs and are available at [place] and [place].  These can be toasted to make them taste better,  And can be  used as a pita chip to use with hummous as  well  [employer] makes a low carb flatbread that's 100 cal and 9 net carbs available at [employer] and [employer] makes 2 sizes of  Low carb whole wheat tortilla  (The large one is 210 cal and 6 net carbs, the small is 120 cal and also 6 net carbs)  [employer] makes flatbreads that are low carb as well . They're called "Fold Its")   Avoid "Low fat dressings, as well as Barry Brunner and Fontanelle dressings They are loaded with sugar!   3 PM/ Mid day  Snack:  Consider  1 ounce of  almonds, walnuts, pistachios, pecans, peanuts,  Macadamia nuts or a nut medley are ok .  Avoid "granola"; the dried cranberries and raisins are loaded with carbohydrates. Mixed nuts as long as there are no raisins,  cranberries or dried fruit.    Try the prosciutto/mozzarella cheese sticks by [employer]  In deli /backery section   High protein 80 cal each , 1 carb   To avoid overindulging in snacks: Try drinking a glass of unsweeted almond/coconut milk  Or a cup of coffee with your Atkins chocolate bar to keep you from having 3!!!   Pork rinds!  Yes Pork Rinds  Are on the diet .  They are your potato chip.       6 PM  Dinner:     Meat/fowl/fish with a green salad, with steamed/grilled/sauteed vegggie: broccoli, cauliflower, green beans, spinach, brussel sprouts or  Lima beans. DO NOT BREAD THE PROTEIN!!

## 2018-11-16 NOTE — Progress Notes (Signed)
Subjective:   Lindsey Bentley is a 73 y.o. female who presents for Medicare Annual (Subsequent) preventive examination.  Review of Systems:  No ROS.  Medicare Wellness Virtual Visit.  Visual/audio telehealth visit, UTA vital signs.   See social history for additional risk factors.   Cardiac Risk Factors include: advanced age (>80mn, >>67women);diabetes mellitus     Objective:     Vitals: There were no vitals taken for this visit.  There is no height or weight on file to calculate BMI.  Advanced Directives 11/16/2018 06/29/2018 06/30/2017 05/17/2017 09/25/2016 05/21/2016 04/14/2016  Does Patient Have a Medical Advance Directive? _0  No No  Would patient like information on creating a medical advance directive? No - Patient declined No - Patient declined No - Patient declined - - - -    Tobacco Social History   Tobacco Use  Smoking Status Never Smoker  Smokeless Tobacco Never Used     Counseling given: Not Answered   Clinical Intake:  Pre-visit preparation completed: Yes        Diabetes: Yes(Followed by pcp)  How often do you need to have someone help you when you read instructions, pamphlets, or other written materials from your doctor or pharmacy?: 1 - Never  Interpreter Needed?: No     Past Medical History:  Diagnosis Date  . Arthritis   . Breast cancer ([place]   . Breast [place], left ([place] 2016   LT LUMPECTOMY - TI, NO, MO - IDC, ER/PR pos, Her 2 neg, Rad tx's.   . Carotid artery occlusion   . Carotid artery occlusion   . 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 CHF (congestive heart failure) ([place]   . COPD (chronic obstructive pulmonary disease) ([place]   . Coronary artery disease   . Depression   . Diffuse cystic mastopathy 2013  . DVT (deep venous thrombosis) ([place]   . Dyspnea   . Elevated TSH   . Family history of adverse reaction to anesthesia    Pt stated that son had a seizure with a combination of anesthesia and  pain medication."  . Fatty liver   . GERD (gastroesophageal reflux disease)   . Glaucoma   . High cholesterol   . History of chicken pox   . Hyperglycemia   . Hyperlipidemia   . Hypertension   . LVH (left ventricular hypertrophy)   . PAC (premature atrial contraction)   . Palpitations   . Peripheral vascular disease ([place]   . Personal history of radiation therapy 2016   BREAST CA  . Pneumonia March 2014  . Skin cancer 2013  . Sleep apnea    wears CPAP set at 2.5  . Wears glasses    Past Surgical History:  Procedure Laterality Date  . BACK SURGERY  1986   ruptured disc  . BREAST BIOPSY Left 2008   NEG  . BREAST BIOPSY Left 08-20-14   POS  . BREAST BIOPSY Right 2007   NEG  . BREAST EXCISIONAL BIOPSY Left 1984   NEG  . 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 BREAST SURGERY Left 09/05/2014   T1a,N0; 5 mm  ER/PR positive. HER2 negative.  Wide excision with SLN biopsy.  MammoSite radiation  . CARPAL TUNNEL RELEASE    . CHOLECYSTECTOMY  2006  . COLONOSCOPY  2010   Dr. SJamal Collin . COLONOSCOPY WITH PROPOFOL N/A 05/17/2017   Procedure: COLONOSCOPY WITH PROPOFOL;  Surgeon: EVira Agar  Gavin Pound, MD;  Location: [facility] ENDOSCOPY;  Service: Endoscopy;  Laterality: N/A;  . DILATION AND CURETTAGE OF UTERUS    . ESOPHAGOGASTRODUODENOSCOPY (EGD) WITH PROPOFOL N/A 05/17/2017   Procedure: ESOPHAGOGASTRODUODENOSCOPY (EGD) WITH PROPOFOL;  Surgeon: Manya Silvas, MD;  Location: [facility] ENDOSCOPY;  Service: Endoscopy;  Laterality: N/A;  . LAPAROTOMY FOR REMOVAL TUMOR LUMBAR PLEXES    . Leg stent  2011  . MOLE REMOVAL  2013   15 removed  . POSTERIOR CERVICAL LAMINECTOMY Left 10/15/2015   Procedure: Left Cervical four- five Hemilaminectomy/Remove mass;  Surgeon: Leeroy Cha, MD;  Location: [facility]UR[facility] ORS;  Service: Neurosurgery;  Laterality: Left;  Left C4-5 Hemilaminectomy/Remove mass   Family History  Problem Relation Age of Onset  . Cancer Father        Prostate  . Diabetes Father   .  Cerebrovascular Accident Father   . Hypertension Mother   . AAA (abdominal aortic aneurysm) Mother   . Hyperlipidemia Other        Parent  . Miscarriages / Stillbirths Other        Parent  . Hypertension Other        parent  . Heart disease Other        Parent  . Breast cancer Maternal Aunt 72   Social History   Socioeconomic History  . Marital status: Married    Spouse name: Not on file  . Number of children: 3  . Years of education: 72  . Highest education level: Not on file  Occupational History  . Occupation: Caregiver   Social Needs  . Financial resource strain: Not hard at all  . Food insecurity    Worry: Never true    Inability: Never true  . Transportation needs    Medical: No    Non-medical: No  Tobacco Use  . Smoking status: Never Smoker  . Smokeless tobacco: Never Used  Substance and Sexual Activity  . Alcohol use: No    Alcohol/week: 0.0 standard drinks  . Drug use: No  . Sexual activity: Never  Lifestyle  . Physical activity    Days per week: 7 days    Minutes per session: 30 min  . Stress: Not at all  Relationships  . Social Herbalist on phone: Not on file    Gets together: Not on file    Attends religious service: Not on file    Active member of club or organization: Not on file    Attends meetings of clubs or organizations: Not on file    Relationship status: Not on file  Other Topics Concern  . Not on file  Social History Narrative   Regular exercise-mo   Caffeine Use-no    Outpatient Encounter Medications as of 11/16/2018  Medication Sig  . aspirin (BAYER ASPIRIN) 325 MG tablet Take 325 mg by mouth daily.  . carvedilol (COREG) 12.5 MG tablet TAKE 1 TABLET(12.5 MG) BY MOUTH TWICE DAILY WITH MEALS  . Cholecalciferol (VITAMIN D-1000 MAX ST) 1000 units tablet Take 1,000 Units by mouth daily.   . hydrALAZINE (APRESOLINE) 10 MG tablet Take 1 tablet (10 mg total) by mouth 3 (three) times daily.  [name] letrozole (FEMARA) 2.5 MG tablet  TAKE 1 TABLET BY MOUTH EVERY DAY  . losartan-hydrochlorothiazide (HYZAAR) 100-25 MG tablet Take 1 tablet by mouth daily.  . meclizine (ANTIVERT) 25 MG tablet Take 1 tablet (25 mg total) by mouth 3 (three) times daily as needed for dizziness or nausea.  [name]  metFORMIN (GLUCOPHAGE) 500 MG tablet Take 1 tablet (500 mg total) by mouth daily with breakfast.  . omeprazole (PRILOSEC) 40 MG capsule Take by mouth.  [name] PROAIR HFA 108 (90 Base) MCG/ACT inhaler INL 2 INHALATIONS ITL Q 6 H PRF WHZ  . rosuvastatin (CRESTOR) 20 MG tablet TAKE 1 TABLET BY MOUTH DAILY  . vitamin E 400 UNIT capsule Take 400 Units by mouth daily.   No facility-administered encounter medications on file as of 11/16/2018.     Activities of Daily Living In your present state of health, do you have any difficulty performing the following activities: 11/16/2018  Hearing? N  Vision? N  Difficulty concentrating or making decisions? N  Walking or climbing stairs? N  Dressing or bathing? N  Doing errands, shopping? N  Preparing Food and eating ? N  Using the Toilet? N  In the past six months, have you accidently leaked urine? N  Do you have problems with loss of bowel control? N  Managing your Medications? N  Managing your Finances? N  Housekeeping or managing your Housekeeping? N  Some recent data might be hidden    Patient Care Team: Einar Pheasant, MD as PCP - General (Internal Medicine) Christene Lye, MD (General Surgery) Einar Pheasant, MD (Internal Medicine)    Assessment:   This is a routine wellness examination for Lindsey Bentley.  I connected with patient 11/16/18 at  1:00 PM EDT by a video/audio enabled telemedicine application and verified that I am speaking with the correct person using two identifiers. Patient stated full name and DOB. Patient gave permission to continue with virtual visit. Patient's location was at home and Nurse's location was at [place] office.   Health Screenings  Mammogram - 10/2018  Colonoscopy - 05/2017 Bone Density - 09/2017 Glaucoma -none Hearing -demonstrates normal hearing during visit. Hemoglobin A1C - 09/2018 Cholesterol - 09/2018  Dental- visits every 6 months Vision- visits within the last 12 months.  Social  Alcohol intake - no        Smoking history- never    Smokers in home? none Illicit drug use? none Physical activity- active around the home, arm/foot peddler 20-40 Diet - regular Sexually Active -not currently BMI- discussed the importance of a healthy diet, water intake and the benefits of aerobic exercise.  Educational material provided.   Safety  Patient feels safe at home- yes Patient does have smoke detectors at home- yes Patient does wear sunscreen or protective clothing when in direct sunlight -yes Patient does wear seat belt when in a moving vehicle -yes Patient drives- yes  HERDE-08 precautions and sickness symptoms discussed.   Activities of Daily Living Patient denies needing assistance with: driving, household chores, feeding themselves, getting from bed to chair, getting to the toilet, bathing/showering, dressing, managing money, or preparing meals.  No new identified risk were noted.    Depression Screen Patient denies losing interest in daily life, feeling hopeless, or crying easily over simple problems.   Medication-taking as directed and without issues.   Fall Screen Patient denies being afraid of falling or falling in the last year.   Memory Screen Patient is alert.  Patient denies difficulty focusing, concentrating or misplacing items. Correctly identified the president of the [place], season and recall. Patient likes to read and have bible study for brain stimulation.  Immunizations The following Immunizations were discussed: Influenza, shingles, pneumonia, and tetanus.   Other Providers Patient Care Team: Einar Pheasant, MD as PCP - General (Internal Medicine) Christene Lye, MD (  General Surgery) Einar Pheasant, MD (Internal Medicine)  Exercise Activities and Dietary recommendations Current Exercise Habits: Home exercise routine, Time (Minutes): 20, Intensity: Mild  Goals      Patient Stated   . DIET - REDUCE PORTION SIZE (pt-stated)     Add more waist/core exercises to regimen       Fall Risk Fall Risk  11/16/2018 11/01/2018 03/29/2018 05/21/2016 04/07/2016  Falls in the past year? 0 0 0 No No   Depression Screen PHQ 2/9 Scores 11/16/2018 08/25/2017 05/21/2016 04/07/2016  PHQ - 2 Score 0 0 0 0  PHQ- 9 Score - - - -     Cognitive Function MMSE - Mini Mental State Exam 02/12/2016  Orientation to time 5  Orientation to Place 5  Registration 3  Attention/ Calculation 5  Recall 2  Language- name 2 objects 2  Language- repeat 1  Language- follow 3 step command 3  Language- read & follow direction 1  Write a sentence 1  Copy design 1  Total score 29     6CIT Screen 11/16/2018  What Year? 0 points  What month? 0 points  What time? 0 points  Count back from 20 0 points  Months in reverse 0 points  Repeat phrase 0 points  Total Score 0    Immunization History  Administered Date(s) Administered  . Pneumococcal Conjugate-13 12/17/2016  . Pneumococcal Polysaccharide-23 07/26/2012  . Td 05/11/2004   Screening Tests Health Maintenance  Topic Date Due  . OPHTHALMOLOGY EXAM  08/14/1955  . TETANUS/TDAP  05/11/2014  . HEMOGLOBIN A1C  03/17/2019  . FOOT EXAM  06/21/2019  . MAMMOGRAM  10/26/2019  . COLONOSCOPY  05/17/2020  . DEXA SCAN  Completed  . Hepatitis C Screening  Completed  . PNA vac Low Risk Adult  Completed  . INFLUENZA VACCINE  Discontinued      Plan:   End of life planning; Advanced aging; Advanced directives discussed.  No HCPOA/Living Will.  Additional information declined at this time.  I have personally reviewed and noted the following in the patient's chart:   . Medical and social history . Use of alcohol, tobacco or illicit drugs  . Current  medications and supplements . Functional ability and status . Nutritional status . Physical activity . Advanced directives . List of other physicians . Hospitalizations, surgeries, and ER visits in previous 12 months . Vitals . Screenings to include cognitive, depression, and falls . Referrals and appointments  In addition, I have reviewed and discussed with patient certain preventive protocols, quality metrics, and best practice recommendations. A written personalized care plan for preventive services as well as general preventive health recommendations were provided to patient.     Varney Biles, LPN  12/10/4173   Reviewed above information. Agree with assessment and plan.    Dr Nicki Reaper

## 2018-11-19 ENCOUNTER — Encounter: Payer: Self-pay | Admitting: Internal Medicine

## 2018-11-19 NOTE — Assessment & Plan Note (Signed)
Followed by [facility].  Last evaluated 04/2018.  Recommended f/u in 2 years.

## 2018-11-19 NOTE — Assessment & Plan Note (Signed)
Has been followed by Dr Raul Del.  Breathing stable.

## 2018-11-19 NOTE — Assessment & Plan Note (Signed)
Sugars as outlined.  On metformin.  Doing better.  Follow met b and a1c.  Low carb diet and exercise.

## 2018-11-19 NOTE — Assessment & Plan Note (Signed)
Saw GI.  Fatty liver on CT.  Have discussed diet and exercise.  Follow liver function tests.

## 2018-11-19 NOTE — Assessment & Plan Note (Signed)
On crestor.  Low cholesterol diet and exercise.  Follow lipid panel and liver function tests.   

## 2018-11-19 NOTE — Assessment & Plan Note (Signed)
Continue risk factor modification.  Followed by cardiology.  

## 2018-11-19 NOTE — Assessment & Plan Note (Signed)
Blood pressure doing better.  Continue current medication regimen.  Follow pressures.  Follow metabolic panel.

## 2018-11-19 NOTE — Assessment & Plan Note (Signed)
Followed by Dr Bary Castilla.  Has upcoming appt with Dr Donella Stade.

## 2018-11-22 ENCOUNTER — Other Ambulatory Visit: Payer: Self-pay | Admitting: Internal Medicine

## 2018-11-22 ENCOUNTER — Ambulatory Visit: Payer: Medicare Other

## 2018-11-22 MED ORDER — HYDRALAZINE HCL 10 MG PO TABS: 10.0000 mg | ORAL_TABLET | Freq: Three times a day (TID) | ORAL | 1 refills | Status: DC

## 2018-11-22 NOTE — Telephone Encounter (Signed)
Refill sent.

## 2018-11-22 NOTE — Telephone Encounter (Signed)
Medication Refill - Medication: hydrALAZINE (APRESOLINE) 10 MG tablet / Pt stated her hydrALAZINE was supposed to be refilled at last OV on 11/14/18. Pt is now out of medication and was unable to take it today. Please refill as soon as possible.  Has the patient contacted their pharmacy? Yes.   (Agent: If no, request that the patient contact the pharmacy for the refill.) (Agent: If yes, when and what did the pharmacy advise?)  Preferred Pharmacy (with phone number or street name):  [facility] [id number], [place] [phone] (Phone) [phone] (Fax)     Agent: Please be advised that RX refills may take up to 3 business days. We ask that you follow-up with your pharmacy.

## 2018-11-22 NOTE — Telephone Encounter (Signed)
Patient aware.

## 2018-12-05 DIAGNOSIS — I10 Essential (primary) hypertension: Secondary | ICD-10-CM | POA: Diagnosis not present

## 2018-12-05 DIAGNOSIS — G4733 Obstructive sleep apnea (adult) (pediatric): Secondary | ICD-10-CM | POA: Diagnosis not present

## 2018-12-09 ENCOUNTER — Other Ambulatory Visit: Payer: Self-pay | Admitting: Internal Medicine

## 2018-12-09 ENCOUNTER — Encounter: Payer: Self-pay | Admitting: General Surgery

## 2018-12-09 NOTE — Telephone Encounter (Signed)
Patient has recent visit and labs ok but the medications requested are historical meds ok to fill?

## 2018-12-09 NOTE — Telephone Encounter (Signed)
Medications:   carvedilol (COREG) 12.5 MG tablet  omeprazole (PRILOSEC) 40 MG capsule   Patient is requesting refills. Patient is completely out of carvedilol.    Pharmacy:  [id number], [place] [phone] (Phone) [phone] (Fax)

## 2018-12-10 MED ORDER — CARVEDILOL 12.5 MG PO TABS: ORAL_TABLET | ORAL | 0 refills | Status: DC

## 2018-12-10 MED ORDER — OMEPRAZOLE 40 MG PO CPDR: 40.0000 mg | DELAYED_RELEASE_CAPSULE | Freq: Every day | ORAL | 0 refills | Status: DC

## 2018-12-10 NOTE — Telephone Encounter (Signed)
rx ok'd for carvedilol and omprazole.

## 2018-12-12 ENCOUNTER — Other Ambulatory Visit: Payer: Self-pay | Admitting: Internal Medicine

## 2018-12-12 NOTE — Telephone Encounter (Signed)
Medication Refill - Medication:  losartan-hydrochlorothiazide (HYZAAR) 100-25 MG tablet   Has the patient contacted their pharmacy? Yes advised to call PCP  Preferred Pharmacy (with phone number or street name):  [id number], [place] [phone] (Phone) [phone] (Fax)   Agent: Please be advised that RX refills may take up to 3 business days. We ask that you follow-up with your pharmacy.

## 2018-12-13 ENCOUNTER — Other Ambulatory Visit: Payer: Self-pay

## 2018-12-13 MED ORDER — LOSARTAN POTASSIUM-HCTZ 100-25 MG PO TABS: 1.0000 | ORAL_TABLET | Freq: Every day | ORAL | 0 refills | Status: DC

## 2018-12-13 MED ORDER — HYDRALAZINE HCL 10 MG PO TABS: 10.0000 mg | ORAL_TABLET | Freq: Three times a day (TID) | ORAL | 1 refills | Status: DC

## 2018-12-13 NOTE — Telephone Encounter (Signed)
Refill sent.

## 2018-12-28 DIAGNOSIS — G4733 Obstructive sleep apnea (adult) (pediatric): Secondary | ICD-10-CM | POA: Diagnosis not present

## 2019-01-25 ENCOUNTER — Other Ambulatory Visit: Payer: Self-pay | Admitting: Internal Medicine

## 2019-01-25 MED ORDER — HYDRALAZINE HCL 10 MG PO TABS: 10.0000 mg | ORAL_TABLET | Freq: Three times a day (TID) | ORAL | 1 refills | Status: DC

## 2019-01-25 NOTE — Telephone Encounter (Signed)
hydrALAZINE (APRESOLINE) 10 MG tablet  [facility] [address]

## 2019-01-25 NOTE — Addendum Note (Signed)
Addended by: Nash Mantis F on: 01/25/2019 10:52 AM   Modules accepted: Orders

## 2019-01-25 NOTE — Telephone Encounter (Signed)
Patient requesting a refill. She states pharmacy does not have additional refill on file. Please resend or contact pharmacy.

## 2019-01-25 NOTE — Telephone Encounter (Signed)
Refill sent.

## 2019-01-27 ENCOUNTER — Other Ambulatory Visit (INDEPENDENT_AMBULATORY_CARE_PROVIDER_SITE_OTHER): Payer: Medicare Other

## 2019-01-27 ENCOUNTER — Other Ambulatory Visit: Payer: Self-pay

## 2019-01-27 DIAGNOSIS — E7800 Pure hypercholesterolemia, unspecified: Secondary | ICD-10-CM

## 2019-01-27 DIAGNOSIS — I10 Essential (primary) hypertension: Secondary | ICD-10-CM | POA: Diagnosis not present

## 2019-01-27 DIAGNOSIS — R7989 Other specified abnormal findings of blood chemistry: Secondary | ICD-10-CM

## 2019-01-27 DIAGNOSIS — R945 Abnormal results of liver function studies: Secondary | ICD-10-CM

## 2019-01-27 DIAGNOSIS — E1159 Type 2 diabetes mellitus with other circulatory complications: Secondary | ICD-10-CM

## 2019-01-27 LAB — LIPID PANEL
Cholesterol: 154 mg/dL (ref 0–200)
HDL: 39.9 mg/dL (ref >39.00)
NonHDL: 114.03
Total CHOL/HDL Ratio: 4
Triglycerides: 277 mg/dL — ABNORMAL HIGH (ref 0.0–149.0)
VLDL: 55.4 mg/dL — ABNORMAL HIGH (ref 0.0–40.0)

## 2019-01-27 LAB — CBC WITH DIFFERENTIAL/PLATELET
Basophils Absolute: 0.1 10*3/uL (ref 0.0–0.1)
Basophils Relative: 1.1 % (ref 0.0–3.0)
Eosinophils Absolute: 0.1 10*3/uL (ref 0.0–0.7)
Eosinophils Relative: 2.1 % (ref 0.0–5.0)
HCT: 35.5 % — ABNORMAL LOW (ref 36.0–46.0)
Hemoglobin: 12.2 g/dL (ref 12.0–15.0)
Lymphocytes Relative: 38.4 % (ref 12.0–46.0)
Lymphs Abs: 2.1 10*3/uL (ref 0.7–4.0)
MCHC: 34.2 g/dL (ref 30.0–36.0)
MCV: 93.6 fl (ref 78.0–100.0)
Monocytes Absolute: 0.5 10*3/uL (ref 0.1–1.0)
Monocytes Relative: 8.9 % (ref 3.0–12.0)
Neutro Abs: 2.7 10*3/uL (ref 1.4–7.7)
Neutrophils Relative %: 49.5 % (ref 43.0–77.0)
Platelets: 180 10*3/uL (ref 150.0–400.0)
RBC: 3.79 Mil/uL — ABNORMAL LOW (ref 3.87–5.11)
RDW: 13.6 % (ref 11.5–15.5)
WBC: 5.5 10*3/uL (ref 4.0–10.5)

## 2019-01-27 LAB — HEPATIC FUNCTION PANEL
ALT: 28 U/L (ref 0–35)
AST: 21 U/L (ref 0–37)
Albumin: 4.2 g/dL (ref 3.5–5.2)
Alkaline Phosphatase: 63 U/L (ref 39–117)
Bilirubin, Direct: 0 mg/dL (ref 0.0–0.3)
Total Bilirubin: 0.5 mg/dL (ref 0.2–1.2)
Total Protein: 7 g/dL (ref 6.0–8.3)

## 2019-01-27 LAB — BASIC METABOLIC PANEL
BUN: 19 mg/dL (ref 6–23)
CO2: 26 mEq/L (ref 19–32)
Calcium: 9.7 mg/dL (ref 8.4–10.5)
Chloride: 104 mEq/L (ref 96–112)
Creatinine, Ser: 0.9 mg/dL (ref 0.40–1.20)
GFR: 61.13 mL/min (ref >60.00)
Glucose, Bld: 135 mg/dL — ABNORMAL HIGH (ref 70–99)
Potassium: 4.4 mEq/L (ref 3.5–5.1)
Sodium: 138 mEq/L (ref 135–145)

## 2019-01-27 LAB — LDL CHOLESTEROL, DIRECT: Direct LDL: 88 mg/dL

## 2019-01-27 LAB — HEMOGLOBIN A1C: Hgb A1c MFr Bld: 7.8 % — ABNORMAL HIGH (ref 4.6–6.5)

## 2019-01-27 LAB — TSH: TSH: 4.93 u[IU]/mL — ABNORMAL HIGH (ref 0.35–4.50)

## 2019-01-28 DIAGNOSIS — G4733 Obstructive sleep apnea (adult) (pediatric): Secondary | ICD-10-CM | POA: Diagnosis not present

## 2019-01-31 ENCOUNTER — Ambulatory Visit (INDEPENDENT_AMBULATORY_CARE_PROVIDER_SITE_OTHER): Payer: Medicare Other | Admitting: Internal Medicine

## 2019-01-31 ENCOUNTER — Encounter: Payer: Self-pay | Admitting: Internal Medicine

## 2019-01-31 ENCOUNTER — Other Ambulatory Visit: Payer: Self-pay

## 2019-01-31 DIAGNOSIS — J449 Chronic obstructive pulmonary disease, unspecified: Secondary | ICD-10-CM

## 2019-01-31 DIAGNOSIS — F439 Reaction to severe stress, unspecified: Secondary | ICD-10-CM

## 2019-01-31 DIAGNOSIS — K219 Gastro-esophageal reflux disease without esophagitis: Secondary | ICD-10-CM

## 2019-01-31 DIAGNOSIS — R945 Abnormal results of liver function studies: Secondary | ICD-10-CM | POA: Diagnosis not present

## 2019-01-31 DIAGNOSIS — I25119 Atherosclerotic heart disease of native coronary artery with unspecified angina pectoris: Secondary | ICD-10-CM | POA: Diagnosis not present

## 2019-01-31 DIAGNOSIS — I6523 Occlusion and stenosis of bilateral carotid arteries: Secondary | ICD-10-CM

## 2019-01-31 DIAGNOSIS — E7800 Pure hypercholesterolemia, unspecified: Secondary | ICD-10-CM

## 2019-01-31 DIAGNOSIS — I10 Essential (primary) hypertension: Secondary | ICD-10-CM

## 2019-01-31 DIAGNOSIS — C50912 Malignant neoplasm of unspecified site of left female breast: Secondary | ICD-10-CM | POA: Diagnosis not present

## 2019-01-31 DIAGNOSIS — R7989 Other specified abnormal findings of blood chemistry: Secondary | ICD-10-CM

## 2019-01-31 DIAGNOSIS — E1159 Type 2 diabetes mellitus with other circulatory complications: Secondary | ICD-10-CM

## 2019-01-31 MED ORDER — HYDRALAZINE HCL 10 MG PO TABS: 10.0000 mg | ORAL_TABLET | Freq: Three times a day (TID) | ORAL | 1 refills | Status: DC

## 2019-01-31 NOTE — Progress Notes (Signed)
[name], female   DOB: 1946/02/19, 73 y.o.   MRN: [id number]   Virtual Visit via video Note  This visit type was conducted due to national recommendations for restrictions regarding the COVID-19 pandemic (e.g. social distancing).  This format is felt to be most appropriate for this patient at this time.  All issues noted in this document were discussed and addressed.  No physical exam was performed (except for noted visual exam findings with Video Visits).   I connected with Lindsey Bentley by a video enabled telemedicine application and verified that I am speaking with the correct person using two identifiers. Location patient: home Location provider: work  Persons participating in the virtual visit: patient, provider  I discussed the limitations, risks, security and privacy concerns of performing an evaluation and management service by video and the availability of in person appointments. The patient expressed understanding and agreed to proceed.   Reason for visit: scheduled follow up.   HPI: She reports she is doing relatively well.  Blood pressure is doing better. On hydralazine.  States blood pressures is averaging 130s/60-70s.  No chest pain.  No sob.  No acid reflux.  No abdominal pain.  She has adjusted her diet.  States her sugars have been averaging 127-135 in am.  Discussed recent labs. Her a1c is still elevated - 7.8 on recent check.  Unable to increase metformin due to GI side effects.  Bowels currently stable.  On crestor.  Taking as directed.  On letrozole.  Has been followed by Dr Bary Castilla.  Wishes to continue to follow up with him.     ROS: See pertinent positives and negatives per HPI.  Past Medical History:  Diagnosis Date  . Arthritis   . Breast cancer ([place])   . Breast cancer, left ([place]) 2016   LT LUMPECTOMY - TI, NO, MO - IDC, ER/PR pos, Her 2 neg, Rad tx's.   . Carotid artery occlusion   . Carotid artery occlusion   . 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 CHF (congestive heart failure) ([place])   . COPD (chronic obstructive pulmonary disease) ([place])   . Coronary artery disease   . Depression   . Diffuse cystic mastopathy 2013  . DVT (deep venous thrombosis) ([place])   . Dyspnea   . Elevated TSH   . Family history of adverse reaction to anesthesia    Pt stated that son had a seizure with a combination of anesthesia and pain medication."  . Fatty liver   . GERD (gastroesophageal reflux disease)   . Glaucoma   . High cholesterol   . History of chicken pox   . Hyperglycemia   . Hyperlipidemia   . Hypertension   . LVH (left ventricular hypertrophy)   . PAC (premature atrial contraction)   . Palpitations   . Peripheral vascular disease ([place])   . Personal history of radiation therapy 2016   BREAST CA  . Pneumonia March 2014  . Skin cancer 2013  . Sleep apnea    wears CPAP set at 2.5  . Wears glasses     Past Surgical History:  Procedure Laterality Date  . BACK SURGERY  1986   ruptured disc  . BREAST BIOPSY Left 2008   NEG  . BREAST BIOPSY Left 08-20-14   POS  . BREAST BIOPSY Right 2007   NEG  . BREAST EXCISIONAL BIOPSY Left 1984   NEG  . 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 BREAST SURGERY Left 09/05/2014   T1a,N0; 5 mm  ER/PR positive. HER2 negative.  Wide excision with SLN biopsy.  MammoSite radiation  . CARPAL TUNNEL RELEASE    . CHOLECYSTECTOMY  2006  . COLONOSCOPY  2010   Dr. Jamal Collin  . COLONOSCOPY WITH PROPOFOL N/A 05/17/2017   Procedure: COLONOSCOPY WITH PROPOFOL;  Surgeon: Manya Silvas, MD;  Location: [facility] ENDOSCOPY;  Service: Endoscopy;  Laterality: N/A;  . DILATION AND CURETTAGE OF UTERUS    . ESOPHAGOGASTRODUODENOSCOPY (EGD) WITH PROPOFOL N/A 05/17/2017   Procedure: ESOPHAGOGASTRODUODENOSCOPY (EGD) WITH PROPOFOL;  Surgeon: Manya Silvas, MD;  Location: [facility] ENDOSCOPY;  Service: Endoscopy;  Laterality: N/A;  . LAPAROTOMY FOR REMOVAL TUMOR LUMBAR PLEXES    . Leg  stent  2011  . MOLE REMOVAL  2013   15 removed  . POSTERIOR CERVICAL LAMINECTOMY Left 10/15/2015   Procedure: Left Cervical four- five Hemilaminectomy/Remove mass;  Surgeon: Leeroy Cha, MD;  Location: [facility] NEURO [facility]S;  Service: Neurosurgery;  Laterality: Left;  Left C4-5 Hemilaminectomy/Remove mass    Family History  Problem Relation Age of Onset  . Cancer Father        Prostate  . Diabetes Father   . Cerebrovascular Accident Father   . Hypertension Mother   . AAA (abdominal aortic aneurysm) Mother   . Hyperlipidemia Other        Parent  . Miscarriages / Stillbirths Other        Parent  . Hypertension Other        parent  . Heart disease Other        Parent  . Breast cancer Maternal Aunt 84    SOCIAL HX: reviewed.    Current Outpatient Medications:  .  aspirin (BAYER ASPIRIN) 325 MG tablet, Take 325 mg by mouth daily., Disp: , Rfl:  .  carvedilol (COREG) 12.5 MG tablet, TAKE 1 TABLET(12.5 MG) BY MOUTH TWICE DAILY WITH MEALS, Disp: 180 tablet, Rfl: 0 .  Cholecalciferol (VITAMIN D-1000 MAX ST) 1000 units tablet, Take 1,000 Units by mouth daily. , Disp: , Rfl:  .  hydrALAZINE (APRESOLINE) 10 MG tablet, Take 1 tablet (10 mg total) by mouth 3 (three) times daily., Disp: 270 tablet, Rfl: 1 .  letrozole (FEMARA) 2.5 MG tablet, TAKE 1 TABLET BY MOUTH EVERY DAY, Disp: 90 tablet, Rfl: 4 .  losartan-hydrochlorothiazide (HYZAAR) 100-25 MG tablet, Take 1 tablet by mouth daily., Disp: 90 tablet, Rfl: 0 .  meclizine (ANTIVERT) 25 MG tablet, Take 1 tablet (25 mg total) by mouth 3 (three) times daily as needed for dizziness or nausea., Disp: 20 tablet, Rfl: 0 .  metFORMIN (GLUCOPHAGE) 500 MG tablet, Take 1 tablet (500 mg total) by mouth daily with breakfast., Disp: 90 tablet, Rfl: 1 .  omeprazole (PRILOSEC) 40 MG capsule, Take 1 capsule (40 mg total) by mouth daily., Disp: 90 capsule, Rfl: 0 .  PROAIR HFA 108 (90 Base) MCG/ACT inhaler, INL 2 INHALATIONS ITL Q 6 H PRF WHZ, Disp: , Rfl: 1 .   rosuvastatin (CRESTOR) 20 MG tablet, TAKE 1 TABLET BY MOUTH DAILY, Disp: 90 tablet, Rfl: 1 .  vitamin E 400 UNIT capsule, Take 400 Units by mouth daily., Disp: , Rfl:   EXAM:  VITALS per patient if applicable:  741/28-78M.    GENERAL: alert, oriented, appears well and in no acute distress  HEENT: atraumatic, conjunttiva clear, no obvious abnormalities on inspection of external nose and ears  NECK: normal movements of the head and neck  LUNGS: on inspection no signs of respiratory distress, breathing rate appears normal, no obvious gross SOB, gasping or wheezing  CV: no obvious cyanosis  PSYCH/NEURO: pleasant and cooperative, no obvious depression or anxiety, speech and thought processing grossly intact  ASSESSMENT AND PLAN:  Discussed the following assessment and plan:  Abnormal liver function test Saw GI.  Fatty liver on CT.  Have discussed diet, exercise and weight loss.  Recent liver panel wnl.    Breast cancer [facility]) Has been followed by Dr Bary Castilla.  On letrozole.  Wishes to continue f/u with Dr Bary Castilla.  Will arrange appt.    CAD (coronary artery disease) Followed by cardiology.  Continue risk factor modification.    Carotid artery stenosis Followed by [facility].  Last evaluated 04/2018.  Recommended f/u in 2 years.    COPD (chronic obstructive pulmonary disease) ([place]) Has been followed by Dr Raul Del. Breathing stable.   Diabetes mellitus with cardiac complication (HCC) Sugars as outlined.  a1c 7.8. discussed diet and exercise.  Unable to increase metformin due to intolerance.  Discussed treatment options including [place], Trulicity/Ozempic, etc.  Discuss with pharmacy regarding cost effective treatment.   GERD (gastroesophageal reflux disease) Controlled on current medicatin.  Follow.    Hypercholesterolemia On crestor.  Low cholesterol diet and exercise.  Follow lipid panel and liver function tests.    Hypertension Blood pressure much improved.  Continue current  medication regimen.  Follow pressures.  Follow metabolic panel.   Stress Handling things well.  Follow.      I discussed the assessment and treatment plan with the patient. The patient was provided an opportunity to ask questions and all were answered. The patient agreed with the plan and demonstrated an understanding of the instructions.   The patient was advised to call back or seek an in-person evaluation if the symptoms worsen or if the condition fails to improve as anticipated.   Einar Pheasant, MD

## 2019-02-05 ENCOUNTER — Encounter: Payer: Self-pay | Admitting: Internal Medicine

## 2019-02-05 NOTE — Assessment & Plan Note (Signed)
Blood pressure much improved.  Continue current medication regimen.  Follow pressures.  Follow metabolic panel.

## 2019-02-05 NOTE — Assessment & Plan Note (Signed)
Has been followed by Dr Bary Castilla.  On letrozole.  Wishes to continue f/u with Dr Bary Castilla.  Will arrange appt.

## 2019-02-05 NOTE — Assessment & Plan Note (Signed)
Sugars as outlined.  a1c 7.8. discussed diet and exercise.  Unable to increase metformin due to intolerance.  Discussed treatment options including Jaridance, Trulicity/Ozempic, etc.  Discuss with pharmacy regarding cost effective treatment.

## 2019-02-05 NOTE — Assessment & Plan Note (Signed)
On crestor.  Low cholesterol diet and exercise.  Follow lipid panel and liver function tests.   

## 2019-02-05 NOTE — Assessment & Plan Note (Signed)
Handling things well.  Follow.   

## 2019-02-05 NOTE — Assessment & Plan Note (Signed)
Followed by cardiology.  Continue risk factor modification.   

## 2019-02-05 NOTE — Assessment & Plan Note (Signed)
Followed by [facility].  Last evaluated 04/2018.  Recommended f/u in 2 years.   

## 2019-02-05 NOTE — Assessment & Plan Note (Signed)
Controlled on current medicatin.  Follow.

## 2019-02-05 NOTE — Assessment & Plan Note (Signed)
Has been followed by Dr Fleming.  Breathing stable.   

## 2019-02-05 NOTE — Assessment & Plan Note (Signed)
Saw GI.  Fatty liver on CT.  Have discussed diet, exercise and weight loss.  Recent liver panel wnl.

## 2019-02-08 DIAGNOSIS — G4733 Obstructive sleep apnea (adult) (pediatric): Secondary | ICD-10-CM | POA: Diagnosis not present

## 2019-02-27 DIAGNOSIS — G4733 Obstructive sleep apnea (adult) (pediatric): Secondary | ICD-10-CM | POA: Diagnosis not present

## 2019-03-07 ENCOUNTER — Telehealth: Payer: Self-pay | Admitting: *Deleted

## 2019-03-07 DIAGNOSIS — E1159 Type 2 diabetes mellitus with other circulatory complications: Secondary | ICD-10-CM

## 2019-03-07 NOTE — Telephone Encounter (Signed)
Copied from [place] [phone]. Topic: General - Other >> Mar 07, 2019 11:19 AM Lindsey Bentley wrote: Reason for CRM: Pt stated she needs Dr. Bary Leriche nurse or assistant to call her back regarding the medication change for Metformin. Pt requests call back. Cb# [phone]

## 2019-03-08 ENCOUNTER — Other Ambulatory Visit: Payer: Self-pay

## 2019-03-08 MED ORDER — LOSARTAN POTASSIUM-HCTZ 100-25 MG PO TABS: 1.0000 | ORAL_TABLET | Freq: Every day | ORAL | 1 refills | Status: DC

## 2019-03-08 MED ORDER — CARVEDILOL 12.5 MG PO TABS: ORAL_TABLET | ORAL | 1 refills | Status: DC

## 2019-03-08 NOTE — Telephone Encounter (Signed)
Patient stated that at her last visit she was told that Dr Nicki Reaper was going to discuss with someone about changing her metformin to something different since sugar continues to increase and she cannot tolerate the increased dose of metformin- causes GI issues. Asked pt if she would be agreeable to consult with Catie. Patient is agreeable to see her or talk with her.

## 2019-03-08 NOTE — Telephone Encounter (Signed)
Order placed for CCM referral.  

## 2019-03-23 ENCOUNTER — Ambulatory Visit (INDEPENDENT_AMBULATORY_CARE_PROVIDER_SITE_OTHER): Payer: Medicare Other | Admitting: Pharmacist

## 2019-03-23 DIAGNOSIS — E1159 Type 2 diabetes mellitus with other circulatory complications: Secondary | ICD-10-CM | POA: Diagnosis not present

## 2019-03-23 MED ORDER — JARDIANCE 10 MG PO TABS: 10.0000 mg | ORAL_TABLET | Freq: Every day | ORAL | 2 refills | Status: DC

## 2019-03-23 NOTE — Progress Notes (Signed)
Reviewed information.  Agree with assessment and plan.    Dr Jenet Durio 

## 2019-03-23 NOTE — Patient Instructions (Addendum)
Lindsey Bentley,   It was great talking with you today!  We discussed that our goal A1c is <7%, which corresponds with fasting sugars <130 and 2 hour after meal sugars <180. Check some of each type of sugar for [place] to see the impact on our medication changes.   Stop Metformin. Pay attention to if there is any change in the frequency of your loose stools.  Start Jardiance 10 mg daily. Take this medication in the morning, as it can increase the frequency of urination.  Enclosed is the meal handout we talked about - pay attention to the serving sizes of the Carbohydrates on the last page.   Please call with any questions!  Visit Information  Goals Addressed            This Visit's Progress     Patient Stated   . "I want to work on my blood sugars" (pt-stated)       Current Barriers:  . Diabetes: uncontrolled; most recent A1c 7.8%  . Current antihyperglycemic regimen: metformin 500 mg  o Reports loose stools, improved w/ dose reduction of metformin from 500 mg BID; notes they were worse when previously on metformin XR . Current meal patterns: o Breakfast: Scrambled eggs, 2 pieces of bacon, 2 pieces of toast, coffee o Lunch: Bowl of soup, sandwich; banana sandwich o Supper: eats between 5-6 pm; largest meal of the day  . Current blood glucose readings: o Fasting: 125-145 . Cardiovascular risk reduction: o Current hypertensive regimen: hydralazine 10 mg TID, losartan/HCTZ 100/25 mg daily o Current hyperlipidemia regimen: rosuvastatin 20 mg daily; last LDL not at goal <70 o Current antiplatelet regimen: ASA 325 mg daily; chart review does not reveal hx TIA/CVA  Pharmacist Clinical Goal(s):  [name] Over the next 90 days, patient will work with PharmD and primary care provider to address optimized glycemic benefit  Interventions: . Comprehensive medication review performed, medication list updated in electronic medical record . Extensive dietary discussion performed; discussed portion sizes and  plate method. Mailed healthy meal planning handout . Patient noted concerns of metformin and dementia. Reassured her that this concern is not supported by the bulk of our evidence. Discussed metformin recall and that she is not effected . Discussed GLP1 (though patient had thyroid nodule, was biopsied and benign) vs SGLT2, in regard to benefits, side effects. Patient hesitant to use injection; will start Jardiance 10 mg daily. Counseled to remain well hydrated. Benefit in ASCVD and HF makes this a good choice.  o Discussed cost concerns. Patient and husband's income appear to qualify her for [place] assistance through [employer]. Will plan to gauge medication tolerability, and pursue PAP if patient continues therapy. . Patient requests to trial time off of metformin. Will hold metformin for now- encouraged patient to review if loose stools improve with metformin discontinuation. If they do not, will consider restarting. . Moving forward, will discuss potential benefit vs risk of continued ASA 325 mg daily vs 81 mg daily.   Patient Self Care Activities:  . Patient will check blood glucose BID , document, and provide at future appointments . Patient will take medications as prescribed . Patient will report any questions or concerns to provider   Initial goal documentation        Lindsey Bentley was given information about Chronic Care Management services today including:  1. CCM service includes personalized support from designated clinical staff supervised by her physician, including individualized plan of care and coordination with other care providers 2.  24/7 contact phone numbers for assistance for urgent and routine care needs. 3. Service will only be billed when office clinical staff spend 20 minutes or more in a month to coordinate care. 4. Only one practitioner may furnish and bill the service in a calendar month. 5. The patient may stop CCM services at any time (effective at the  end of the month) by phone call to the office staff. 6. The patient will be responsible for cost sharing (co-pay) of up to 20% of the service fee (after annual deductible is met).  Patient agreed to services and verbal consent obtained.   The patient verbalized understanding of instructions provided today and declined a print copy of patient instruction materials.   Plan: - Scheduled follow up in ~5 weeks for continued medication management review  Catie Darnelle Maffucci, PharmD, [place] Pharmacist [place] [phone]

## 2019-03-23 NOTE — Chronic Care Management (AMB) (Signed)
Chronic Care Management   Note  03/23/2019 Name: Lindsey Bentley MRN: [id number] DOB: [birth year]   Subjective:  Lindsey Bentley is a 73 y.o. year old female who is a primary care patient of Einar Pheasant, MD. The CCM team was consulted for assistance with chronic disease management and care coordination needs.     Lindsey Bentley was given information about Chronic Care Management services today including:  1. CCM service includes personalized support from designated clinical staff supervised by her physician, including individualized plan of care and coordination with other care providers 2. 24/7 contact phone numbers for assistance for urgent and routine care needs. 3. Service will only be billed when office clinical staff spend 20 minutes or more in a month to coordinate care. 4. Only one practitioner may furnish and bill the service in a calendar month. 5. The patient may stop CCM services at any time (effective at the end of the month) by phone call to the office staff. 6. The patient will be responsible for cost sharing (co-pay) of up to 20% of the service fee (after annual deductible is met).  Patient agreed to services and verbal consent obtained.   Review of patient status, including review of consultants reports, laboratory and other test data, was performed as part of comprehensive evaluation and provision of chronic care management services.   Objective:  Lab Results  Component Value Date   CREATININE 0.90 01/27/2019   CREATININE 0.91 09/14/2018   CREATININE 0.90 06/15/2018    Lab Results  Component Value Date   HGBA1C 7.8 (H) 01/27/2019       Component Value Date/Time   CHOL 154 01/27/2019 0825   TRIG 277.0 (H) 01/27/2019 0825   HDL 39.90 01/27/2019 0825   CHOLHDL 4 01/27/2019 0825   VLDL 55.4 (H) 01/27/2019 0825   LDLCALC 64 04/15/2017 0841   LDLDIRECT 88.0 01/27/2019 0825    Clinical ASCVD: Yes - PVD  BP Readings from Last 3 Encounters:  02/05/19 136/78   11/01/18 (!) 148/87  09/19/18 (!) 143/83    Allergies  Allergen Reactions  . Codeine Nausea And Vomiting  . Prednisone Swelling  . Statins Other (See Comments)    Muscle Pain, can take Crestor  . Tape Itching and Rash    Please use "paper" tape Please use "paper" tape    Medications Reviewed Today    Reviewed by [name], [facility] (Pharmacist) on 03/23/19 at [place] List Status: <None>  Medication Order Taking? Sig Documenting Provider Last Dose Status Informant  aspirin (BAYER ASPIRIN) 325 MG tablet [id number] Yes Take 325 mg by mouth daily. [provider] Taking Active Self  carvedilol (COREG) 12.5 MG tablet [id number] Yes TAKE 1 TABLET(12.5 MG) BY MOUTH TWICE DAILY WITH MEALS Einar Pheasant, MD Taking Active   Cholecalciferol (VITAMIN D-1000 MAX ST) 1000 units tablet [id number] Yes Take 1,000 Units by mouth daily.  [provider] Taking Active Self           Med Note Wilmon Arms   Wed Oct 09, 2015  4:29 PM)    hydrALAZINE (APRESOLINE) 10 MG tablet [id number] Yes Take 1 tablet (10 mg total) by mouth 3 (three) times daily. Einar Pheasant, MD Taking Active   letrozole [facility]) 2.5 MG tablet [id number] Yes TAKE 1 TABLET BY MOUTH EVERY DAY Sankar, Seeplaputhur G, MD Taking Active   losartan-hydrochlorothiazide (HYZAAR) 100-25 MG tablet [id number] Yes Take 1 tablet by mouth daily. Einar Pheasant, MD Taking Active  Med Note Darnelle Maffucci,  E   Thu Mar 23, 2019  4:09 PM) morning  meclizine (ANTIVERT) 25 MG tablet [id number]  Take 1 tablet (25 mg total) by mouth 3 (three) times daily as needed for dizziness or nausea. Paulette Blanch, MD  Active            Med Note Darnelle Maffucci, Arville Lime   Thu Mar 23, 2019  4:12 PM) PRN  metFORMIN (GLUCOPHAGE) 500 MG tablet [id number] Yes Take 1 tablet (500 mg total) by mouth daily with breakfast. Einar Pheasant, MD Taking Active   omeprazole (PRILOSEC) 40 MG capsule [id number] Yes Take 1 capsule (40 mg total) by mouth  daily. Einar Pheasant, MD Taking Active   PROAIR HFA 108 [phone] Base) MCG/ACT inhaler [id number] Yes INL 2 INHALATIONS ITL Q 6 H PRF WHZ [provider] Taking Active            Med Note Nat Christen Mar 23, 2019  4:11 PM) PRN  rosuvastatin (CRESTOR) 20 MG tablet [id number] Yes TAKE 1 TABLET BY MOUTH DAILY Einar Pheasant, MD Taking Active   vitamin E 400 UNIT capsule [id number] Yes Take 400 Units by mouth daily. [provider] Taking Active Self           Assessment:   Goals Addressed            This Visit's Progress     Patient Stated   . "I want to work on my blood sugars" (pt-stated)       Current Barriers:  . Diabetes: uncontrolled; most recent A1c 7.8%  . Current antihyperglycemic regimen: metformin 500 mg  o Reports loose stools, improved w/ dose reduction of metformin from 500 mg BID; notes they were worse when previously on metformin XR . Current meal patterns: o Breakfast: Scrambled eggs, 2 pieces of bacon, 2 pieces of toast, coffee o Lunch: Bowl of soup, sandwich; banana sandwich o Supper: eats between 5-6 pm; largest meal of the day  . Current blood glucose readings: o Fasting: 125-145 . Cardiovascular risk reduction: o Current hypertensive regimen: hydralazine 10 mg TID, losartan/HCTZ 100/25 mg daily o Current hyperlipidemia regimen: rosuvastatin 20 mg daily; last LDL not at goal <70 o Current antiplatelet regimen: ASA 325 mg daily; chart review does not reveal hx TIA/CVA  Pharmacist Clinical Goal(s):  [name] Over the next 90 days, patient will work with PharmD and primary care provider to address optimized glycemic benefit  Interventions: . Comprehensive medication review performed, medication list updated in electronic medical record . Extensive dietary discussion performed; discussed portion sizes and plate method. Mailed healthy meal planning handout . Patient noted concerns of metformin and dementia. Reassured her that this concern  is not supported by the bulk of our evidence. Discussed metformin recall and that she is not effected . Discussed GLP1 (though patient had thyroid nodule, was biopsied and benign) vs SGLT2, in regard to benefits, side effects. Patient hesitant to use injection; will start Jardiance 10 mg daily. Counseled to remain well hydrated. Benefit in ASCVD and HF makes this a good choice.  o Discussed cost concerns. Patient and husband's income appear to qualify her for [place] assistance through [employer]. Will plan to gauge medication tolerability, and pursue PAP if patient continues therapy. . Patient requests to trial time off of metformin. Will hold metformin for now- encouraged patient to review if loose stools improve with metformin discontinuation. If they do not, will consider restarting.  Patient Self Care Activities:  .  Patient will check blood glucose BID , document, and provide at future appointments . Patient will take medications as prescribed . Patient will report any questions or concerns to provider   Initial goal documentation        Plan: - Scheduled follow up in ~5 weeks for continued medication management review  Catie Darnelle Maffucci, PharmD, [place] Pharmacist [place] [phone]

## 2019-03-29 DIAGNOSIS — H6123 Impacted cerumen, bilateral: Secondary | ICD-10-CM | POA: Diagnosis not present

## 2019-03-29 DIAGNOSIS — R221 Localized swelling, mass and lump, neck: Secondary | ICD-10-CM | POA: Diagnosis not present

## 2019-03-30 DIAGNOSIS — G4733 Obstructive sleep apnea (adult) (pediatric): Secondary | ICD-10-CM | POA: Diagnosis not present

## 2019-04-03 ENCOUNTER — Other Ambulatory Visit: Payer: Self-pay | Admitting: Internal Medicine

## 2019-04-03 MED ORDER — ROSUVASTATIN CALCIUM 20 MG PO TABS: 20.0000 mg | ORAL_TABLET | Freq: Every day | ORAL | 1 refills | Status: DC

## 2019-04-03 NOTE — Telephone Encounter (Signed)
Medication Refill - Medication:  rosuvastatin (CRESTOR) 20 MG tablet   Has the patient contacted their pharmacy?  Yes advised to call office. Pt states the pharmacy has reached out.   Preferred Pharmacy (with phone number or street name):  [facility] [id number], [place] [phone] (Phone) [phone] (Fax)   Agent: Please be advised that RX refills may take up to 3 business days. We ask that you follow-up with your pharmacy.

## 2019-04-20 ENCOUNTER — Other Ambulatory Visit: Payer: Self-pay | Admitting: Pharmacy Technician

## 2019-04-20 ENCOUNTER — Ambulatory Visit (INDEPENDENT_AMBULATORY_CARE_PROVIDER_SITE_OTHER): Payer: Medicare Other | Admitting: Pharmacist

## 2019-04-20 DIAGNOSIS — I25119 Atherosclerotic heart disease of native coronary artery with unspecified angina pectoris: Secondary | ICD-10-CM

## 2019-04-20 DIAGNOSIS — E1159 Type 2 diabetes mellitus with other circulatory complications: Secondary | ICD-10-CM

## 2019-04-20 NOTE — Progress Notes (Signed)
Reviewed information.  Agree with plan.  Will f/u regarding cholesterol and a1c.    Dr Nicki Reaper

## 2019-04-20 NOTE — Patient Instructions (Signed)
Visit Information  Goals Addressed            This Visit's Progress     Patient Stated   . "I want to work on my blood sugars" (pt-stated)       Current Barriers:  . Diabetes: uncontrolled; most recent A1c 7.8%  o Notes that fluid buildup in her legs has improved since starting Jardiance o Notes that loose stools have gone away since d/c metformin . Current antihyperglycemic regimen: Jardiance 10 mg daily  o Hx intolerance to metformin, loose stools  . Current blood glucose readings: o Fasting: All <130; lowest 120 o Random: 120-130s  . Cardiovascular risk reduction: o Current hypertensive regimen: hydralazine 10 mg TID, losartan/HCTZ 100/25 mg daily, carvedilol 6.25 mg BID o Current hyperlipidemia regimen: rosuvastatin 20 mg daily; last LDL not at goal <70 o Current antiplatelet regimen: ASA 325 mg daily; chart review does not reveal hx TIA/CVA, but patient does have PAD, followed by vascular   Pharmacist Clinical Goal(s):  [name] Over the next 90 days, patient will work with PharmD and primary care provider to address optimized glycemic benefit  Interventions: . Comprehensive medication review performed, medication list updated in electronic medical record . Continued Jardiance 10 mg daily. Discontinued metformin from her profile.  . Given BG being well controlled on Jardiance 10 mg daily, will continue this dose vs increasing to 25 mg daily; if A1c uncontrolled on next check, consider increase to Jardiance 25 mg daily.  . Will pursue patient assistance through [place] for 2020/2021. Explained process to patient. Will collaborate w/ Susy Frizzle, CPhT to mail patient portion of her application. Discussed proof of income; patient will mail back 2020 SSI awards letters or 2019 1099 form, along w/ completed application. I will collaborate w/ Dr. Nicki Reaper on her portion of application . Pending updated lipid panel, consider increasing rosuvastatin to 40 mg daily of LDL not at goal  <70, given her dx PAD and other ASCVD risk factors . Could consider discussing w/ vascular whether ASA 325 mg daily is necessary, or if ASA 81 mg daily would be sufficient.   Patient Self Care Activities:  . Patient will check blood glucose BID , document, and provide at future appointments . Patient will take medications as prescribed . Patient will report any questions or concerns to provider   Please see past updates related to this goal by clicking on the "Past Updates" button in the selected goal         The patient verbalized understanding of instructions provided today and declined a print copy of patient instruction materials.   Plan: - Will collaborate w/ provider and CPhT as above - Will outreach patient for continued medication management in ~4-6 weeks  Catie Darnelle Maffucci, PharmD, [place], [place] Pharmacist [place] [phone]

## 2019-04-20 NOTE — Patient Outreach (Signed)
[name] Care Management  04/20/2019  Lindsey Bentley 03-17-1946 [id number]                                       Medication Assistance Referral  Referral From: [facility] Embedded RPh [name]   Medication/Company: [name] Patient application portion:  Mailed Provider application portion:  N/A Embedded pharmacist to have signed in clinic to Dr Einar Pheasant Provider address/fax verified via: Call to office     Follow up:  Will follow up with patient in 20-30 business days to confirm application(s) have been received.  [name], [place] [facility] [phone]

## 2019-04-20 NOTE — Chronic Care Management (AMB) (Signed)
Chronic Care Management   Follow Up Note   04/20/2019 Name: Lindsey Bentley MRN: [id number] DOB: 02/13/1946  Referred by: Lindsey Pheasant, MD Reason for referral : Chronic Care Management (Medication Management)   Lindsey Bentley is a 73 y.o. year old female who is a primary care patient of Lindsey Pheasant, MD. The CCM team was consulted for assistance with chronic disease management and care coordination needs.    Contacted patient for medication management review.   Review of patient status, including review of consultants reports, relevant laboratory and other test results, and collaboration with appropriate care team members and the patient's provider was performed as part of comprehensive patient evaluation and provision of chronic care management services.    SDOH (Social Determinants of Health) screening performed today: Financial Strain . See Care Plan for related entries.   Outpatient Encounter Medications as of 04/20/2019  Medication Sig Note  . aspirin (BAYER ASPIRIN) 325 MG tablet Take 325 mg by mouth daily.   . carvedilol (COREG) 12.5 MG tablet TAKE 1 TABLET(12.5 MG) BY MOUTH TWICE DAILY WITH MEALS   . Cholecalciferol (VITAMIN D-1000 MAX ST) 1000 units tablet Take 1,000 Units by mouth daily.    . empagliflozin (JARDIANCE) 10 MG TABS tablet Take 10 mg by mouth daily before breakfast.   . hydrALAZINE (APRESOLINE) 10 MG tablet Take 1 tablet (10 mg total) by mouth 3 (three) times daily.   Lindsey Bentley letrozole (FEMARA) 2.5 MG tablet TAKE 1 TABLET BY MOUTH EVERY DAY   . losartan-hydrochlorothiazide (HYZAAR) 100-25 MG tablet Take 1 tablet by mouth daily. 03/23/2019: morning  . omeprazole (PRILOSEC) 40 MG capsule Take 1 capsule (40 mg total) by mouth daily.   Lindsey Bentley PROAIR HFA 108 (90 Base) MCG/ACT inhaler INL 2 INHALATIONS ITL Q 6 H PRF WHZ 03/23/2019: PRN  . rosuvastatin (CRESTOR) 20 MG tablet Take 1 tablet (20 mg total) by mouth daily.   . vitamin E 400 UNIT capsule Take 400 Units by mouth  daily.   . meclizine (ANTIVERT) 25 MG tablet Take 1 tablet (25 mg total) by mouth 3 (three) times daily as needed for dizziness or nausea. 03/23/2019: PRN  . [DISCONTINUED] metFORMIN (GLUCOPHAGE) 500 MG tablet Take 1 tablet (500 mg total) by mouth daily with breakfast.    No facility-administered encounter medications on file as of 04/20/2019.     Goals Addressed            This Visit's Progress     Patient Stated   . "I want to work on my blood sugars" (pt-stated)       Current Barriers:  . Diabetes: uncontrolled; most recent A1c 7.8%  o Notes that fluid buildup in her legs has improved since starting Jardiance o Notes that loose stools have gone away since d/c metformin . Current antihyperglycemic regimen: Jardiance 10 mg daily  o Hx intolerance to metformin, loose stools  . Current blood glucose readings: o Fasting: All <130; lowest 120 o Random: 120-130s  . Cardiovascular risk reduction: o Current hypertensive regimen: hydralazine 10 mg TID, losartan/HCTZ 100/25 mg daily, carvedilol 6.25 mg BID o Current hyperlipidemia regimen: rosuvastatin 20 mg daily; last LDL not at goal <70 o Current antiplatelet regimen: ASA 325 mg daily; chart review does not reveal hx TIA/CVA, but patient does have PAD, followed by vascular   Pharmacist Clinical Goal(s):  Lindsey Bentley Over the next 90 days, patient will work with PharmD and primary care provider to address optimized glycemic benefit  Interventions: . Comprehensive medication review  performed, medication list updated in electronic medical record . Continued Jardiance 10 mg daily. Discontinued metformin from her profile.  . Given BG being well controlled on Jardiance 10 mg daily, will continue this dose vs increasing to 25 mg daily; if A1c uncontrolled on next check, consider increase to Jardiance 25 mg daily.  . Will pursue patient assistance through [employer] for 2020/2021. Explained process to patient. Will collaborate w/ Lindsey Bentley,  CPhT to mail patient portion of her application. Discussed proof of income; patient will mail back 2020 SSI awards letters or 2019 1099 form, along w/ completed application. I will collaborate w/ Lindsey Bentley on her portion of application . Pending updated lipid panel, consider increasing rosuvastatin to 40 mg daily of LDL not at goal <70, given her dx PAD and other ASCVD risk factors . Could consider discussing w/ vascular whether ASA 325 mg daily is necessary, or if ASA 81 mg daily would be sufficient.   Patient Self Care Activities:  . Patient will check blood glucose BID , document, and provide at future appointments . Patient will take medications as prescribed . Patient will report any questions or concerns to provider   Please see past updates related to this goal by clicking on the "Past Updates" button in the selected goal         Plan: - Will collaborate w/ provider and CPhT as above - Will outreach patient for continued medication management in ~4-6 weeks  Lindsey Bentley, PharmD, [place], [place] Pharmacist [facility] [phone]

## 2019-04-25 ENCOUNTER — Ambulatory Visit: Payer: Self-pay | Admitting: Pharmacist

## 2019-04-25 DIAGNOSIS — I25119 Atherosclerotic heart disease of native coronary artery with unspecified angina pectoris: Secondary | ICD-10-CM

## 2019-04-25 DIAGNOSIS — E1159 Type 2 diabetes mellitus with other circulatory complications: Secondary | ICD-10-CM

## 2019-04-25 NOTE — Chronic Care Management (AMB) (Signed)
Chronic Care Management   Follow Up Note   04/25/2019 Name: Lindsey Bentley MRN: [id number] DOB: 1945/10/18  Referred by: Einar Pheasant, MD Reason for referral : Chronic Care Management (Medication Management)   Lindsey Bentley is a 73 y.o. year old female who is a primary care patient of Einar Pheasant, MD. The CCM team was consulted for assistance with chronic disease management and care coordination needs.    Received call today from patient with question about medication access needs.   Review of patient status, including review of consultants reports, relevant laboratory and other test results, and collaboration with appropriate care team members and the patient's provider was performed as part of comprehensive patient evaluation and provision of chronic care management services.    SDOH (Social Determinants of Health) screening performed today: Financial Strain . See Care Plan for related entries.   Outpatient Encounter Medications as of 04/25/2019  Medication Sig Note  . aspirin (BAYER ASPIRIN) 325 MG tablet Take 325 mg by mouth daily.   . carvedilol (COREG) 12.5 MG tablet TAKE 1 TABLET(12.5 MG) BY MOUTH TWICE DAILY WITH MEALS   . Cholecalciferol (VITAMIN D-1000 MAX ST) 1000 units tablet Take 1,000 Units by mouth daily.    . empagliflozin (JARDIANCE) 10 MG TABS tablet Take 10 mg by mouth daily before breakfast.   . hydrALAZINE (APRESOLINE) 10 MG tablet Take 1 tablet (10 mg total) by mouth 3 (three) times daily.   [name] letrozole (FEMARA) 2.5 MG tablet TAKE 1 TABLET BY MOUTH EVERY DAY   . losartan-hydrochlorothiazide (HYZAAR) 100-25 MG tablet Take 1 tablet by mouth daily. 03/23/2019: morning  . meclizine (ANTIVERT) 25 MG tablet Take 1 tablet (25 mg total) by mouth 3 (three) times daily as needed for dizziness or nausea. 03/23/2019: PRN  . omeprazole (PRILOSEC) 40 MG capsule Take 1 capsule (40 mg total) by mouth daily.   [name] PROAIR HFA 108 (90 Base) MCG/ACT inhaler INL 2 INHALATIONS  ITL Q 6 H PRF WHZ 03/23/2019: PRN  . rosuvastatin (CRESTOR) 20 MG tablet Take 1 tablet (20 mg total) by mouth daily.   . vitamin E 400 UNIT capsule Take 400 Units by mouth daily.    No facility-administered encounter medications on file as of 04/25/2019.     Goals Addressed            This Visit's Progress     Patient Stated   . "I want to work on my blood sugars" (pt-stated)       Current Barriers:  . Diabetes: uncontrolled; most recent A1c 7.8%  o Discussed applying for BI assistance for Jardiance. However, patient was unable to find her [place] awards letter or her 2019 1099 form . Current antihyperglycemic regimen: Jardiance 10 mg daily  o Hx intolerance to metformin, loose stools  . Current blood glucose readings: o Fasting: All <130; lowest 120 o Random: 120-130s  . Cardiovascular risk reduction: o Current hypertensive regimen: hydralazine 10 mg TID, losartan/HCTZ 100/25 mg daily, carvedilol 6.25 mg BID o Current hyperlipidemia regimen: rosuvastatin 20 mg daily; last LDL not at goal <70 o Current antiplatelet regimen: ASA 325 mg daily; chart review does not reveal hx TIA/CVA, but patient does have PAD, followed by vascular   Pharmacist Clinical Goal(s):  [name] Over the next 90 days, patient will work with PharmD and primary care provider to address optimized glycemic benefit  Interventions: . Discussed that we could try submitting a bank statement showing SSA deposits; patient considered this, but is  very uneasy with this option. Notes that she would like to see how she continues to do on therapy before submitting personal information anywhere. Will hold on pursuing assistance for BI for Jardiance at this time, and will reevaluate in Jan/Feb when she receives 2021 [facility] awards letters.   Patient Self Care Activities:  . Patient will check blood glucose BID , document, and provide at future appointments . Patient will take medications as prescribed . Patient will  report any questions or concerns to provider   Please see past updates related to this goal by clicking on the "Past Updates" button in the selected goal          Plan: - Scheduled outreach to patient on 06/04/2018 at 10 am to f/u on medication management  Catie Darnelle Maffucci, PharmD, [place], [place] Pharmacist [place] [phone]

## 2019-04-25 NOTE — Patient Instructions (Signed)
Visit Information  Goals Addressed            This Visit's Progress     Patient Stated   . "I want to work on my blood sugars" (pt-stated)       Current Barriers:  . Diabetes: uncontrolled; most recent A1c 7.8%  o Discussed applying for BI assistance for Jardiance. However, patient was unable to find her [place] awards letter or her 2019 1099 form . Current antihyperglycemic regimen: Jardiance 10 mg daily  o Hx intolerance to metformin, loose stools  . Current blood glucose readings: o Fasting: All <130; lowest 120 o Random: 120-130s  . Cardiovascular risk reduction: o Current hypertensive regimen: hydralazine 10 mg TID, losartan/HCTZ 100/25 mg daily, carvedilol 6.25 mg BID o Current hyperlipidemia regimen: rosuvastatin 20 mg daily; last LDL not at goal <70 o Current antiplatelet regimen: ASA 325 mg daily; chart review does not reveal hx TIA/CVA, but patient does have PAD, followed by vascular   Pharmacist Clinical Goal(s):  [name] Over the next 90 days, patient will work with PharmD and primary care provider to address optimized glycemic benefit  Interventions: . Discussed that we could try submitting a bank statement showing SSA deposits; patient considered this, but is very uneasy with this option. Notes that she would like to see how she continues to do on therapy before submitting personal information anywhere. Will hold on pursuing assistance for BI for Jardiance at this time, and will reevaluate in Jan/Feb when she receives 2021 [facility] awards letters.   Patient Self Care Activities:  . Patient will check blood glucose BID , document, and provide at future appointments . Patient will take medications as prescribed . Patient will report any questions or concerns to provider   Please see past updates related to this goal by clicking on the "Past Updates" button in the selected goal         The patient verbalized understanding of instructions provided today and  declined a print copy of patient instruction materials.   Plan: - Scheduled outreach to patient on 06/04/2018 at 10 am to f/u on medication management  Catie Darnelle Maffucci, PharmD, [place], [place] Pharmacist [place] [phone]

## 2019-04-25 NOTE — Progress Notes (Signed)
Reviewed.  Agree with plan   Dr Marlys Stegmaier 

## 2019-04-29 DIAGNOSIS — G4733 Obstructive sleep apnea (adult) (pediatric): Secondary | ICD-10-CM | POA: Diagnosis not present

## 2019-05-10 DIAGNOSIS — G4733 Obstructive sleep apnea (adult) (pediatric): Secondary | ICD-10-CM | POA: Diagnosis not present

## 2019-05-15 ENCOUNTER — Other Ambulatory Visit: Payer: Self-pay | Admitting: Pharmacy Technician

## 2019-05-15 ENCOUNTER — Other Ambulatory Visit: Payer: Self-pay

## 2019-05-15 NOTE — Patient Outreach (Signed)
[facility]) Care Management  05/15/2019  Lindsey Bentley Dec 04, 1945 [id number]   Unsuccessful call placed to patient regarding patient assistance application(s) for Jardiance with BI , HIPAA compliant voicemail left.   Was calling patient to inquire if she has received the application that was mailed to her on  04/20/2019.  Follow up:  Will follow up with 2nd outreach call in 5-7 business days if call is not returned.  [name] P. Aubreyanna Dorrough, [place] Management [phone]

## 2019-05-15 NOTE — Patient Outreach (Signed)
[facility]) Care Management  05/15/2019  Lindsey Bentley 07-14-1945 [id number]  ADDENDUM  Incoming call received from patient in response to voicemail message that was left for her.  Spoke to patient, HIPAA identifiers verified.  Patient informed she has received the application and has received the social security statement as proof of income. Patient informed she was going to wait until her appointment with embedded pharmacist Catie Traivs on 06/05/2019 before mailing back in the application in case medication adjustments were needed. Patient was also concerned about the security of her financial documents. Informed patient that she could either mail them back in the envelope that was included with her application or she could drop off the information when she had an in person appointment making sure to let the person she gives the information to know that it is for pharmacist Catie Darnelle Maffucci. Also informed patient that all financial information was shredded and not kept on file.Patient verbalized understanding.  Will route note to embedded RPh Catie Darnelle Maffucci if information is not received within 15-20 business days.  Lindsey Bentley, [place] Management [phone]

## 2019-05-17 ENCOUNTER — Other Ambulatory Visit: Payer: Self-pay

## 2019-05-17 ENCOUNTER — Other Ambulatory Visit (INDEPENDENT_AMBULATORY_CARE_PROVIDER_SITE_OTHER): Payer: Medicare Other

## 2019-05-17 DIAGNOSIS — I10 Essential (primary) hypertension: Secondary | ICD-10-CM

## 2019-05-17 DIAGNOSIS — E7800 Pure hypercholesterolemia, unspecified: Secondary | ICD-10-CM | POA: Diagnosis not present

## 2019-05-17 DIAGNOSIS — E1159 Type 2 diabetes mellitus with other circulatory complications: Secondary | ICD-10-CM | POA: Diagnosis not present

## 2019-05-17 LAB — LIPID PANEL
Cholesterol: 140 mg/dL (ref 0–200)
HDL: 37.8 mg/dL — ABNORMAL LOW (ref >39.00)
NonHDL: 101.92
Total CHOL/HDL Ratio: 4
Triglycerides: 209 mg/dL — ABNORMAL HIGH (ref 0.0–149.0)
VLDL: 41.8 mg/dL — ABNORMAL HIGH (ref 0.0–40.0)

## 2019-05-17 LAB — HEMOGLOBIN A1C: Hgb A1c MFr Bld: 7.3 % — ABNORMAL HIGH (ref 4.6–6.5)

## 2019-05-17 LAB — HEPATIC FUNCTION PANEL
ALT: 23 U/L (ref 0–35)
AST: 14 U/L (ref 0–37)
Albumin: 4.2 g/dL (ref 3.5–5.2)
Alkaline Phosphatase: 59 U/L (ref 39–117)
Bilirubin, Direct: 0.1 mg/dL (ref 0.0–0.3)
Total Bilirubin: 0.4 mg/dL (ref 0.2–1.2)
Total Protein: 7.2 g/dL (ref 6.0–8.3)

## 2019-05-17 LAB — MICROALBUMIN / CREATININE URINE RATIO
Creatinine,U: 98.7 mg/dL
Microalb Creat Ratio: 1.2 mg/g (ref 0.0–30.0)
Microalb, Ur: 1.2 mg/dL (ref 0.0–1.9)

## 2019-05-17 LAB — BASIC METABOLIC PANEL
BUN: 27 mg/dL — ABNORMAL HIGH (ref 6–23)
CO2: 24 mEq/L (ref 19–32)
Calcium: 9.6 mg/dL (ref 8.4–10.5)
Chloride: 105 mEq/L (ref 96–112)
Creatinine, Ser: 1.03 mg/dL (ref 0.40–1.20)
GFR: 52.27 mL/min — ABNORMAL LOW (ref >60.00)
Glucose, Bld: 150 mg/dL — ABNORMAL HIGH (ref 70–99)
Potassium: 3.9 mEq/L (ref 3.5–5.1)
Sodium: 138 mEq/L (ref 135–145)

## 2019-05-17 LAB — LDL CHOLESTEROL, DIRECT: Direct LDL: 78 mg/dL

## 2019-05-17 LAB — TSH: TSH: 4.34 u[IU]/mL (ref 0.35–4.50)

## 2019-05-18 ENCOUNTER — Ambulatory Visit (INDEPENDENT_AMBULATORY_CARE_PROVIDER_SITE_OTHER): Payer: Medicare Other | Admitting: Internal Medicine

## 2019-05-18 VITALS — BP 132/68 | HR 58 | Ht 64.5 in | Wt 204.0 lb

## 2019-05-18 DIAGNOSIS — E041 Nontoxic single thyroid nodule: Secondary | ICD-10-CM

## 2019-05-18 DIAGNOSIS — E1159 Type 2 diabetes mellitus with other circulatory complications: Secondary | ICD-10-CM

## 2019-05-18 DIAGNOSIS — I10 Essential (primary) hypertension: Secondary | ICD-10-CM | POA: Diagnosis not present

## 2019-05-18 DIAGNOSIS — K219 Gastro-esophageal reflux disease without esophagitis: Secondary | ICD-10-CM

## 2019-05-18 DIAGNOSIS — M25473 Effusion, unspecified ankle: Secondary | ICD-10-CM

## 2019-05-18 DIAGNOSIS — I6523 Occlusion and stenosis of bilateral carotid arteries: Secondary | ICD-10-CM

## 2019-05-18 DIAGNOSIS — I25119 Atherosclerotic heart disease of native coronary artery with unspecified angina pectoris: Secondary | ICD-10-CM

## 2019-05-18 DIAGNOSIS — R7989 Other specified abnormal findings of blood chemistry: Secondary | ICD-10-CM

## 2019-05-18 DIAGNOSIS — R945 Abnormal results of liver function studies: Secondary | ICD-10-CM | POA: Diagnosis not present

## 2019-05-18 DIAGNOSIS — E7800 Pure hypercholesterolemia, unspecified: Secondary | ICD-10-CM | POA: Diagnosis not present

## 2019-05-18 DIAGNOSIS — J449 Chronic obstructive pulmonary disease, unspecified: Secondary | ICD-10-CM

## 2019-05-18 DIAGNOSIS — I739 Peripheral vascular disease, unspecified: Secondary | ICD-10-CM

## 2019-05-18 DIAGNOSIS — C50912 Malignant neoplasm of unspecified site of left female breast: Secondary | ICD-10-CM

## 2019-05-18 NOTE — Progress Notes (Signed)
[name], female   DOB: 17-Jul-1945, 74 y.o.   MRN: [id number]   Virtual Visit via video Note  This visit type was conducted due to national recommendations for restrictions regarding the COVID-19 pandemic (e.g. social distancing).  This format is felt to be most appropriate for this patient at this time.  All issues noted in this document were discussed and addressed.  No physical exam was performed (except for noted visual exam findings with Video Visits).   I connected with [name] by a video enabled telemedicine application and verified that I am speaking with the correct person using two identifiers. Location patient: home Location provider: work Persons participating in the virtual visit: patient, provider  The limitations, risks, security and privacy concerns of performing an evaluation and management service by telephone and the availability of in person appointments have been discussed.  The patient expressed understanding and agreed to proceed.   Reason for visit: scheduled follow up.    HPI: She reports she is doing better.  States had an episode previously where she noticed being more sob with exertion.  No cough or congestion.  No chest pain or tightness.  Resolved on its on.  She was questioning if could be COPD flare.  Since that resolved, she has done well.  No sob.  No chest pain.  No acid reflux reported.  No abdominal pain.  Bowels moving - better.  No diarrhea.  Blood pressure doing well.  AM sugar 130s. PM lower.  Left ankle no longer swelling.  Overall she feels she is doing well.     ROS: See pertinent positives and negatives per HPI.  Past Medical History:  Diagnosis Date  . Arthritis   . Breast cancer ([place])   . Breast cancer, left ([place]) 2016   LT LUMPECTOMY - TI, NO, MO - IDC, ER/PR pos, Her 2 neg, Rad tx's.   . Carotid artery occlusion   . Carotid artery occlusion   . 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 CHF  (congestive heart failure) ([place])   . COPD (chronic obstructive pulmonary disease) ([place])   . Coronary artery disease   . Depression   . Diffuse cystic mastopathy 2013  . DVT (deep venous thrombosis) ([place])   . Dyspnea   . Elevated TSH   . Family history of adverse reaction to anesthesia    Pt stated that son had a seizure with a combination of anesthesia and pain medication."  . Fatty liver   . GERD (gastroesophageal reflux disease)   . Glaucoma   . High cholesterol   . History of chicken pox   . Hyperglycemia   . Hyperlipidemia   . Hypertension   . LVH (left ventricular hypertrophy)   . PAC (premature atrial contraction)   . Palpitations   . Peripheral vascular disease ([place])   . Personal history of radiation therapy 2016   BREAST CA  . Pneumonia March 2014  . Skin cancer 2013  . Sleep apnea    wears CPAP set at 2.5  . Wears glasses     Past Surgical History:  Procedure Laterality Date  . BACK SURGERY  1986   ruptured disc  . BREAST BIOPSY Left 2008   NEG  . BREAST BIOPSY Left 08-20-14   POS  . BREAST BIOPSY Right 2007   NEG  . BREAST EXCISIONAL BIOPSY Left 1984   NEG  . 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  BREAST SURGERY Left 09/05/2014   T1a,N0; 5 mm  ER/PR positive. HER2 negative.  Wide excision with SLN biopsy.  MammoSite radiation  . CARPAL TUNNEL RELEASE    . CHOLECYSTECTOMY  2006  . COLONOSCOPY  2010   Dr. Jamal Collin  . COLONOSCOPY WITH PROPOFOL N/A 05/17/2017   Procedure: COLONOSCOPY WITH PROPOFOL;  Surgeon: Manya Silvas, MD;  Location: [facility] ENDOSCOPY;  Service: Endoscopy;  Laterality: N/A;  . DILATION AND CURETTAGE OF UTERUS    . ESOPHAGOGASTRODUODENOSCOPY (EGD) WITH PROPOFOL N/A 05/17/2017   Procedure: ESOPHAGOGASTRODUODENOSCOPY (EGD) WITH PROPOFOL;  Surgeon: Manya Silvas, MD;  Location: [facility] ENDOSCOPY;  Service: Endoscopy;  Laterality: N/A;  . LAPAROTOMY FOR REMOVAL TUMOR LUMBAR PLEXES    . Leg stent  2011  . MOLE REMOVAL  2013   15 removed  .  POSTERIOR CERVICAL LAMINECTOMY Left 10/15/2015   Procedure: Left Cervical four- five Hemilaminectomy/Remove mass;  Surgeon: Leeroy Cha, MD;  Location: [facility] NEURO ORS;  Service: Neurosurgery;  Laterality: Left;  Left C4-5 Hemilaminectomy/Remove mass    Family History  Problem Relation Age of Onset  . Cancer Father        Prostate  . Diabetes Father   . Cerebrovascular Accident Father   . Hypertension Mother   . AAA (abdominal aortic aneurysm) Mother   . Hyperlipidemia Other        Parent  . Miscarriages / Stillbirths Other        Parent  . Hypertension Other        parent  . Heart disease Other        Parent  . Breast cancer Maternal Aunt 90    SOCIAL HX: reviewed.    Current Outpatient Medications:  .  aspirin (BAYER ASPIRIN) 325 MG tablet, Take 325 mg by mouth daily., Disp: , Rfl:  .  carvedilol (COREG) 12.5 MG tablet, TAKE 1 TABLET(12.5 MG) BY MOUTH TWICE DAILY WITH MEALS, Disp: 180 tablet, Rfl: 1 .  Cholecalciferol (VITAMIN D-1000 MAX ST) 1000 units tablet, Take 1,000 Units by mouth daily. , Disp: , Rfl:  .  empagliflozin (JARDIANCE) 10 MG TABS tablet, Take 10 mg by mouth daily before breakfast., Disp: 30 tablet, Rfl: 2 .  hydrALAZINE (APRESOLINE) 10 MG tablet, Take 1 tablet (10 mg total) by mouth 3 (three) times daily., Disp: 270 tablet, Rfl: 1 .  letrozole (FEMARA) 2.5 MG tablet, TAKE 1 TABLET BY MOUTH EVERY DAY, Disp: 90 tablet, Rfl: 4 .  losartan-hydrochlorothiazide (HYZAAR) 100-25 MG tablet, Take 1 tablet by mouth daily., Disp: 90 tablet, Rfl: 1 .  meclizine (ANTIVERT) 25 MG tablet, Take 1 tablet (25 mg total) by mouth 3 (three) times daily as needed for dizziness or nausea., Disp: 20 tablet, Rfl: 0 .  omeprazole (PRILOSEC) 40 MG capsule, Take 1 capsule (40 mg total) by mouth daily., Disp: 90 capsule, Rfl: 0 .  PROAIR HFA 108 (90 Base) MCG/ACT inhaler, INL 2 INHALATIONS ITL Q 6 H PRF WHZ, Disp: , Rfl: 1 .  rosuvastatin (CRESTOR) 20 MG tablet, Take 1 tablet (20 mg total)  by mouth daily., Disp: 90 tablet, Rfl: 1 .  vitamin E 400 UNIT capsule, Take 400 Units by mouth daily., Disp: , Rfl:   EXAM:  VITALS per patient if applicable: 353/29  GENERAL: alert, oriented, appears well and in no acute distress  HEENT: atraumatic, conjunttiva clear, no obvious abnormalities on inspection of external nose and ears  NECK: normal movements of the head and neck  LUNGS: on inspection no signs  of respiratory distress, breathing rate appears normal, no obvious gross SOB, gasping or wheezing  CV: no obvious cyanosis  PSYCH/NEURO: pleasant and cooperative, no obvious depression or anxiety, speech and thought processing grossly intact  ASSESSMENT AND PLAN:  Discussed the following assessment and plan:  Abnormal liver function test Saw GI.  Fatty liver on CT.  Diet, exercise and weight loss.  Follow liver function tests.  05/17/19 - liver function tests wnl.   Ankle swelling Improved.   Breast cancer ([place]) Followed by Dr Bary Castilla. On letrozole.    CAD (coronary artery disease) Followed by cardiology.  Continue risk factor modification.  Had the sob with exertion recently.  Discussed with her today.  Resolved on its on. She relates to her lungs. Discussed further cardiac w/up.  She declines.  Wants to monitor. Overall she feels she is doing well.    Carotid artery stenosis Followed by [facility].  Last evaluated in 04/2018.  Recommended f/u in 2 years.    COPD (chronic obstructive pulmonary disease) ([place]) Has been followed by Dr Raul Del.  Had the episode recently - sob with exertion.  She related to her lungs.  Feels her breathing is doing well.  Desires no further intervention or work up.  Follow.    Diabetes mellitus with cardiac complication (HCC) M6Q improved.  AM sugars 130s. Continuing to improve.  Hold on additional medication since trending down.  Follow met b and a1c.   GERD (gastroesophageal reflux disease) Controlled on current regimen.  Follow.    Hypercholesterolemia On crestor.  Low cholesterol diet and exercise.  Follow lipid panel and liver function tests.    Hypertension Blood pressure under good control.  Continue same medication regimen.  Follow pressures.  Follow metabolic panel.    Peripheral vascular disease ([place]) Followed by [facility].  Last evaluated 04/2018.  Recommended f/u in 2 years.    Thyroid nodule Evaluated by Dr Gabriel Carina.  Biopsy negative.  Follow tsh.    Orders Placed This Encounter  Procedures  . Hemoglobin A1c    Standing Status:   Future    Standing Expiration Date:   05/17/2020  . Hepatic function panel    Standing Status:   Future    Standing Expiration Date:   05/17/2020  . Lipid panel    Standing Status:   Future    Standing Expiration Date:   05/17/2020  . Basic metabolic panel (future)    Standing Status:   Future    Standing Expiration Date:   05/17/2020    No orders of the defined types were placed in this encounter.    I discussed the assessment and treatment plan with the patient. The patient was provided an opportunity to ask questions and all were answered. The patient agreed with the plan and demonstrated an understanding of the instructions.   The patient was advised to call back or seek an in-person evaluation if the symptoms worsen or if the condition fails to improve as anticipated.   Einar Pheasant, MD

## 2019-05-21 ENCOUNTER — Encounter: Payer: Self-pay | Admitting: Internal Medicine

## 2019-05-21 NOTE — Assessment & Plan Note (Signed)
Followed by Dr Bary Castilla. On letrozole.

## 2019-05-21 NOTE — Assessment & Plan Note (Signed)
Evaluated by Dr Solum.  Biopsy negative.  Follow tsh.   

## 2019-05-21 NOTE — Assessment & Plan Note (Signed)
On crestor.  Low cholesterol diet and exercise.  Follow lipid panel and liver function tests.   

## 2019-05-21 NOTE — Assessment & Plan Note (Signed)
Has been followed by Dr Raul Del.  Had the episode recently - sob with exertion.  She related to her lungs.  Feels her breathing is doing well.  Desires no further intervention or work up.  Follow.

## 2019-05-21 NOTE — Assessment & Plan Note (Signed)
Followed by [facility].  Last evaluated 04/2018.  Recommended f/u in 2 years.

## 2019-05-21 NOTE — Assessment & Plan Note (Signed)
Controlled on current regimen.  Follow.  

## 2019-05-21 NOTE — Assessment & Plan Note (Addendum)
Saw GI.  Fatty liver on CT.  Diet, exercise and weight loss.  Follow liver function tests.  05/17/19 - liver function tests wnl.

## 2019-05-21 NOTE — Assessment & Plan Note (Signed)
Blood pressure under good control.  Continue same medication regimen.  Follow pressures.  Follow metabolic panel.   

## 2019-05-21 NOTE — Assessment & Plan Note (Signed)
a1c improved.  AM sugars 130s. Continuing to improve.  Hold on additional medication since trending down.  Follow met b and a1c.

## 2019-05-21 NOTE — Assessment & Plan Note (Signed)
Followed by [facility].  Last evaluated in 04/2018.  Recommended f/u in 2 years.   

## 2019-05-21 NOTE — Assessment & Plan Note (Signed)
Followed by cardiology.  Continue risk factor modification.  Had the sob with exertion recently.  Discussed with her today.  Resolved on its on. She relates to her lungs. Discussed further cardiac w/up.  She declines.  Wants to monitor. Overall she feels she is doing well.

## 2019-05-21 NOTE — Assessment & Plan Note (Signed)
Improved

## 2019-05-30 DIAGNOSIS — G4733 Obstructive sleep apnea (adult) (pediatric): Secondary | ICD-10-CM | POA: Diagnosis not present

## 2019-06-05 ENCOUNTER — Ambulatory Visit (INDEPENDENT_AMBULATORY_CARE_PROVIDER_SITE_OTHER): Payer: Medicare Other | Admitting: Pharmacist

## 2019-06-05 ENCOUNTER — Other Ambulatory Visit: Payer: Self-pay | Admitting: Pharmacy Technician

## 2019-06-05 DIAGNOSIS — E1159 Type 2 diabetes mellitus with other circulatory complications: Secondary | ICD-10-CM

## 2019-06-05 DIAGNOSIS — I10 Essential (primary) hypertension: Secondary | ICD-10-CM

## 2019-06-05 DIAGNOSIS — I25119 Atherosclerotic heart disease of native coronary artery with unspecified angina pectoris: Secondary | ICD-10-CM | POA: Diagnosis not present

## 2019-06-05 NOTE — Chronic Care Management (AMB) (Signed)
Chronic Care Management   Follow Up Note   06/05/2019 Name: Lindsey Bentley MRN: [id number] DOB: 1945/05/13  Referred by: Einar Pheasant, MD Reason for referral : Chronic Care Management (Medication Management)   Lindsey Bentley is a 74 y.o. year old female who is a primary care patient of Einar Pheasant, MD. The CCM team was consulted for assistance with chronic disease management and care coordination needs.    Contacted patient for medication management review.   Review of patient status, including review of consultants reports, relevant laboratory and other test results, and collaboration with appropriate care team members and the patient's provider was performed as part of comprehensive patient evaluation and provision of chronic care management services.    SDOH (Social Determinants of Health) screening performed today: Financial Strain . See Care Plan for related entries.   Outpatient Encounter Medications as of 06/05/2019  Medication Sig Note  . aspirin (BAYER ASPIRIN) 325 MG tablet Take 325 mg by mouth daily.   . carvedilol (COREG) 12.5 MG tablet TAKE 1 TABLET(12.5 MG) BY MOUTH TWICE DAILY WITH MEALS   . empagliflozin (JARDIANCE) 10 MG TABS tablet Take 10 mg by mouth daily before breakfast.   . hydrALAZINE (APRESOLINE) 10 MG tablet Take 1 tablet (10 mg total) by mouth 3 (three) times daily.   [name] letrozole (FEMARA) 2.5 MG tablet TAKE 1 TABLET BY MOUTH EVERY DAY   . losartan-hydrochlorothiazide (HYZAAR) 100-25 MG tablet Take 1 tablet by mouth daily. 03/23/2019: morning  . meclizine (ANTIVERT) 25 MG tablet Take 1 tablet (25 mg total) by mouth 3 (three) times daily as needed for dizziness or nausea. 03/23/2019: PRN  . omeprazole (PRILOSEC) 40 MG capsule Take 1 capsule (40 mg total) by mouth daily.   [name] PROAIR HFA 108 (90 Base) MCG/ACT inhaler INL 2 INHALATIONS ITL Q 6 H PRF WHZ 03/23/2019: PRN  . rosuvastatin (CRESTOR) 20 MG tablet Take 1 tablet (20 mg total) by mouth daily.   .  vitamin E 400 UNIT capsule Take 400 Units by mouth daily.   . Cholecalciferol (VITAMIN D-1000 MAX ST) 1000 units tablet Take 1,000 Units by mouth daily.     No facility-administered encounter medications on file as of 06/05/2019.     Objective:   Goals Addressed            This Visit's Progress     Patient Stated   . "I want to work on my blood sugars" (pt-stated)       Current Barriers:  . Diabetes: uncontrolled; most recent A1c 7.3%  o Reports still being happy that swelling in her feet/ankles has been resolved w/ Jardiance therapy.  o Notes that she is ready to pursue patient assistance from [place] . Current antihyperglycemic regimen: Jardiance 10 mg daily  o Hx intolerance to metformin, loose stools  . Current blood glucose readings: o Reports readings continue to be at goal; fastings ~120-130s . Cardiovascular risk reduction: o Current hypertensive regimen: hydralazine 10 mg TID, losartan/HCTZ 100/25 mg daily, carvedilol 6.25 mg BID o Current hyperlipidemia regimen: rosuvastatin 20 mg daily; last LDL close to goal <70 given hx CAD/PAD o Current antiplatelet regimen: ASA 325 mg daily; chart review does not reveal hx TIA/CVA, but patient does have PVD, followed by vascular   Pharmacist Clinical Goal(s):  [name] Over the next 90 days, patient will work with PharmD and primary care provider to address optimized glycemic benefit  Interventions: . Comprehensive medication review performed, medication list updated in electronic medical record .  Reviewed goal A1c <7%, corresponding w/ fasting <130 and 2 hour post prandial <180. Reviewed that recent A1c was not with a full 3 months of Jardiance 10 mg therapy, so will defer dose changes at this time. If next A1c is not at goal <7%, recommend increasing Jardiance to 25 mg daily.  . Reviewed requirements of patient assistance program. Patient will complete application and mail back to [facility], CPhT.    Patient Self Care  Activities:  . Patient will check blood glucose BID , document, and provide at future appointments . Patient will take medications as prescribed . Patient will report any questions or concerns to provider   Please see past updates related to this goal by clicking on the "Past Updates" button in the selected goal          Plan:  - Will collaborate w/ patient, provider, and CPhT as above - Scheduled f/u call 07/06/19  Catie Darnelle Maffucci, PharmD, BCACP, [place] Pharmacist [place] [phone]

## 2019-06-05 NOTE — Patient Outreach (Signed)
[facility]) Care Management  06/05/2019  Lindsey Bentley [birth year] [id number]   Incoming in basket message received from embedded [place] regarding patient assistance application(s) for Jardiance with [employer] , Received the following inbasket message from the pharmacist " [name], [facility]  [name], [name], CPhT  Patient is going to complete her portion and mail back to you with income info. I offered bringing to clinic, but she would prefer not to come into clinic d/t germs."   Follow up:  Will route note to embedded [facility] RPh Catie Darnelle Maffucci for case closure if document(s) have not been received in the next 15 business days.  Nadege Carriger P. Melissa Pulido, [place] Management [phone]

## 2019-06-05 NOTE — Patient Instructions (Signed)
Visit Information  Goals Addressed            This Visit's Progress     Patient Stated   . "I want to work on my blood sugars" (pt-stated)       Current Barriers:  . Diabetes: uncontrolled; most recent A1c 7.3%  o Reports still being happy that swelling in her feet/ankles has been resolved w/ Jardiance therapy.  o Notes that she is ready to pursue patient assistance from [place] . Current antihyperglycemic regimen: Jardiance 10 mg daily  o Hx intolerance to metformin, loose stools  . Current blood glucose readings: o Reports readings continue to be at goal; fastings ~120-130s . Cardiovascular risk reduction: o Current hypertensive regimen: hydralazine 10 mg TID, losartan/HCTZ 100/25 mg daily, carvedilol 6.25 mg BID o Current hyperlipidemia regimen: rosuvastatin 20 mg daily; last LDL close to goal <70 given hx CAD/PAD o Current antiplatelet regimen: ASA 325 mg daily; chart review does not reveal hx TIA/CVA, but patient does have PVD, followed by vascular   Pharmacist Clinical Goal(s):  [name] Over the next 90 days, patient will work with PharmD and primary care provider to address optimized glycemic benefit  Interventions: . Comprehensive medication review performed, medication list updated in electronic medical record . Reviewed goal A1c <7%, corresponding w/ fasting <130 and 2 hour post prandial <180. Reviewed that recent A1c was not with a full 3 months of Jardiance 10 mg therapy, so will defer dose changes at this time. If next A1c is not at goal <7%, recommend increasing Jardiance to 25 mg daily.  . Reviewed requirements of patient assistance program. Patient will complete application and mail back to [facility], CPhT.    Patient Self Care Activities:  . Patient will check blood glucose BID , document, and provide at future appointments . Patient will take medications as prescribed . Patient will report any questions or concerns to provider   Please see past updates  related to this goal by clicking on the "Past Updates" button in the selected goal         The patient verbalized understanding of instructions provided today and declined a print copy of patient instruction materials.   Plan:  - Will collaborate w/ patient, provider, and CPhT as above - Scheduled f/u call 07/06/19  Catie Darnelle Maffucci, PharmD, BCACP, [place] Pharmacist [place] [phone]

## 2019-06-05 NOTE — Progress Notes (Signed)
Reviewed information.  Agree with plan.    Dr Ever Halberg 

## 2019-06-10 DIAGNOSIS — G4733 Obstructive sleep apnea (adult) (pediatric): Secondary | ICD-10-CM | POA: Diagnosis not present

## 2019-06-20 ENCOUNTER — Other Ambulatory Visit: Payer: Self-pay | Admitting: Pharmacy Technician

## 2019-06-20 NOTE — Patient Outreach (Signed)
Lindsey Bentley) Care Management  06/20/2019  Lindsey Bentley 10/14/1945 [id number]    Received both patient and provider portion(s) of patient assistance application(s) for Jardiance. Faxed  completed application and required documents into [employer].  Will follow up with company(ies) in 7-14 business days to check status of application(s).  Lindsey Bentley, [place] Management [phone]

## 2019-06-22 ENCOUNTER — Ambulatory Visit: Payer: Self-pay | Admitting: Pharmacist

## 2019-06-22 ENCOUNTER — Other Ambulatory Visit: Payer: Self-pay

## 2019-06-22 ENCOUNTER — Telehealth: Payer: Self-pay | Admitting: Internal Medicine

## 2019-06-22 DIAGNOSIS — E1159 Type 2 diabetes mellitus with other circulatory complications: Secondary | ICD-10-CM

## 2019-06-22 MED ORDER — JARDIANCE 10 MG PO TABS: 10.0000 mg | ORAL_TABLET | Freq: Every day | ORAL | 2 refills | Status: DC

## 2019-06-22 MED ORDER — OMEPRAZOLE 40 MG PO CPDR: 40.0000 mg | DELAYED_RELEASE_CAPSULE | Freq: Every day | ORAL | 0 refills | Status: DC

## 2019-06-22 NOTE — Telephone Encounter (Signed)
Pt needs refill on empagliflozin (JARDIANCE) 10 MG TABS tablet sent to [facility] on [address]. Pharmacy asked her to call.

## 2019-06-22 NOTE — Telephone Encounter (Signed)
Pt called back and states that she needs a refill on omeprazole (PRILOSEC) 40 MG capsule as well.

## 2019-06-22 NOTE — Patient Instructions (Signed)
Visit Information  Goals Addressed            This Visit's Progress     Patient Stated   . "I want to work on my blood sugars" (pt-stated)       Current Barriers:  . Diabetes: uncontrolled; most recent A1c 7.3%  o Received call from patient wondering the status on patient assistance application for Jardiance . Current antihyperglycemic regimen: Jardiance 10 mg daily  o Hx intolerance to metformin, loose stools  . Current blood glucose readings: o Reports readings continue to be at goal; fastings ~120-130s . Cardiovascular risk reduction: o Current hypertensive regimen: hydralazine 10 mg TID, losartan/HCTZ 100/25 mg daily, carvedilol 6.25 mg BID o Current hyperlipidemia regimen: rosuvastatin 20 mg daily; last LDL close to goal <70 given hx CAD/PAD o Current antiplatelet regimen: ASA 325 mg daily; chart review does not reveal hx TIA/CVA, but patient does have PVD, followed by vascular   Pharmacist Clinical Goal(s):  [name] Over the next 90 days, patient will work with PharmD and primary care provider to address optimized glycemic benefit  Interventions: . Submitted to [facility] on 06/20/19. CPhT will f/u in 5-9 business days to determine status of application.  . Patient is going to go ahead and fill a 30 day supply of Jardiance to tide her over until patient assistance medication supply arrives.   Patient Self Care Activities:  . Patient will check blood glucose BID , document, and provide at future appointments . Patient will take medications as prescribed . Patient will report any questions or concerns to provider   Please see past updates related to this goal by clicking on the "Past Updates" button in the selected goal         The patient verbalized understanding of instructions provided today and declined a print copy of patient instruction materials.   Plan:  - Will outreach patient as previously scheduled  Catie Darnelle Maffucci, PharmD, [place], [place] Pharmacist [place] [phone]

## 2019-06-22 NOTE — Telephone Encounter (Signed)
Medication refilled. Pt aware

## 2019-06-22 NOTE — Chronic Care Management (AMB) (Signed)
Chronic Care Management   Follow Up Note   06/22/2019 Name: QUANTIA LEVI MRN: [id number] DOB: 01/25/1946  Referred by: Einar Pheasant, MD Reason for referral : Chronic Care Management (Medication Management)   JAZALYNN WOMELSDORF is a 74 y.o. year old female who is a primary care patient of Einar Pheasant, MD. The CCM team was consulted for assistance with chronic disease management and care coordination needs.    Received call from patient today.   Review of patient status, including review of consultants reports, relevant laboratory and other test results, and collaboration with appropriate care team members and the patient's provider was performed as part of comprehensive patient evaluation and provision of chronic care management services.    SDOH (Social Determinants of Health) screening performed today: Financial Strain . See Care Plan for related entries.   Outpatient Encounter Medications as of 06/22/2019  Medication Sig Note  . aspirin (BAYER ASPIRIN) 325 MG tablet Take 325 mg by mouth daily.   . carvedilol (COREG) 12.5 MG tablet TAKE 1 TABLET(12.5 MG) BY MOUTH TWICE DAILY WITH MEALS   . Cholecalciferol (VITAMIN D-1000 MAX ST) 1000 units tablet Take 1,000 Units by mouth daily.    . empagliflozin (JARDIANCE) 10 MG TABS tablet Take 10 mg by mouth daily before breakfast.   . hydrALAZINE (APRESOLINE) 10 MG tablet Take 1 tablet (10 mg total) by mouth 3 (three) times daily.   [name] letrozole (FEMARA) 2.5 MG tablet TAKE 1 TABLET BY MOUTH EVERY DAY   . losartan-hydrochlorothiazide (HYZAAR) 100-25 MG tablet Take 1 tablet by mouth daily. 03/23/2019: morning  . meclizine (ANTIVERT) 25 MG tablet Take 1 tablet (25 mg total) by mouth 3 (three) times daily as needed for dizziness or nausea. 03/23/2019: PRN  . omeprazole (PRILOSEC) 40 MG capsule Take 1 capsule (40 mg total) by mouth daily.   [name] PROAIR HFA 108 (90 Base) MCG/ACT inhaler INL 2 INHALATIONS ITL Q 6 H PRF WHZ 03/23/2019: PRN  .  rosuvastatin (CRESTOR) 20 MG tablet Take 1 tablet (20 mg total) by mouth daily.   . vitamin E 400 UNIT capsule Take 400 Units by mouth daily.    No facility-administered encounter medications on file as of 06/22/2019.     Objective:   Goals Addressed            This Visit's Progress     Patient Stated   . "I want to work on my blood sugars" (pt-stated)       Current Barriers:  . Diabetes: uncontrolled; most recent A1c 7.3%  o Received call from patient wondering the status on patient assistance application for Jardiance . Current antihyperglycemic regimen: Jardiance 10 mg daily  o Hx intolerance to metformin, loose stools  . Current blood glucose readings: o Reports readings continue to be at goal; fastings ~120-130s . Cardiovascular risk reduction: o Current hypertensive regimen: hydralazine 10 mg TID, losartan/HCTZ 100/25 mg daily, carvedilol 6.25 mg BID o Current hyperlipidemia regimen: rosuvastatin 20 mg daily; last LDL close to goal <70 given hx CAD/PAD o Current antiplatelet regimen: ASA 325 mg daily; chart review does not reveal hx TIA/CVA, but patient does have PVD, followed by vascular   Pharmacist Clinical Goal(s):  [name] Over the next 90 days, patient will work with PharmD and primary care provider to address optimized glycemic benefit  Interventions: . Submitted to [facility] on 06/20/19. CPhT will f/u in 5-9 business days to determine status of application.  . Patient is going to go ahead and fill  a 30 day supply of Jardiance to tide her over until patient assistance medication supply arrives.   Patient Self Care Activities:  . Patient will check blood glucose BID , document, and provide at future appointments . Patient will take medications as prescribed . Patient will report any questions or concerns to provider   Please see past updates related to this goal by clicking on the "Past Updates" button in the selected goal          Plan:  - Will outreach patient as  previously scheduled  Catie Darnelle Maffucci, PharmD, [place], [place] Pharmacist [place] [phone]

## 2019-06-23 NOTE — Progress Notes (Signed)
Reviewed information.  Agree with plan.    Dr Dashiel Bergquist 

## 2019-06-30 DIAGNOSIS — G4733 Obstructive sleep apnea (adult) (pediatric): Secondary | ICD-10-CM | POA: Diagnosis not present

## 2019-07-03 ENCOUNTER — Telehealth: Payer: Self-pay | Admitting: Internal Medicine

## 2019-07-03 NOTE — Telephone Encounter (Signed)
Spoken to patient. She stated she is having sharp pain in her right groin/leg where stent is located x five days. When patient moves her leg a certain way she feels something grab inside her leg. Also at random times just a sharp stabbing pain. She is not having redness, swelling, constant pain, chest pain, SOB, arm pain, and jaw pain. Patient has never had issues in the past but did have history of clots. Stent was placed about eight years ago. She refused UC/ED. Stated that all she needs is Dr Nicki Reaper, she knows her very well. I tried to reason patient into going to ED but patient again refused. Appointment was made for Thursday 07-06-19

## 2019-07-03 NOTE — Telephone Encounter (Signed)
Pt stated that she has a stent in her right leg and it is starting to hurt, no swelling in the leg  Pt also said that it is off and on pain

## 2019-07-04 NOTE — Telephone Encounter (Signed)
Please call pt and confirm doing ok.  Let her know that I am out of the office.  I would prefer her go to acute care for evaluation to confirm nothing more acute going on and then I can f/u with her afterwards.

## 2019-07-04 NOTE — Telephone Encounter (Signed)
LMTCB

## 2019-07-04 NOTE — Telephone Encounter (Signed)
Pt stated that she is feeling better today and does not think she needs to go to urgent care. Advised of message below. Patient is going to wait until appt on 2/25. Confirmed doing ok.

## 2019-07-05 ENCOUNTER — Other Ambulatory Visit: Payer: Self-pay | Admitting: Pharmacy Technician

## 2019-07-05 NOTE — Patient Outreach (Signed)
[facility]) Care Management  07/05/2019  Lindsey Bentley 1946-01-29 [id number]   Care coordination call placed to [employer] in regards to patient's application for Jardiance.  Spoke to [place] who informed patient was APPROVED 06/24/2019-05/10/2020. She informed patient would receive a 90 days supply of medication delivered to her home on 07/07/2019 by the [facility].  Will follow up with patient in 5-7 business days to confirm receipt of medication.  Audric Venn P. Nathanie Ottley, [place] Management [phone]

## 2019-07-06 ENCOUNTER — Other Ambulatory Visit: Payer: Self-pay

## 2019-07-06 ENCOUNTER — Ambulatory Visit (INDEPENDENT_AMBULATORY_CARE_PROVIDER_SITE_OTHER): Payer: Medicare Other | Admitting: Pharmacist

## 2019-07-06 ENCOUNTER — Ambulatory Visit: Payer: Medicare Other | Admitting: Radiation Oncology

## 2019-07-06 ENCOUNTER — Ambulatory Visit (INDEPENDENT_AMBULATORY_CARE_PROVIDER_SITE_OTHER): Payer: Medicare Other | Admitting: Internal Medicine

## 2019-07-06 DIAGNOSIS — R1031 Right lower quadrant pain: Secondary | ICD-10-CM | POA: Diagnosis not present

## 2019-07-06 DIAGNOSIS — I739 Peripheral vascular disease, unspecified: Secondary | ICD-10-CM

## 2019-07-06 DIAGNOSIS — E1159 Type 2 diabetes mellitus with other circulatory complications: Secondary | ICD-10-CM | POA: Diagnosis not present

## 2019-07-06 DIAGNOSIS — I10 Essential (primary) hypertension: Secondary | ICD-10-CM | POA: Diagnosis not present

## 2019-07-06 NOTE — Patient Instructions (Signed)
Visit Information  Goals Addressed            This Visit's Progress     Patient Stated   . "I want to work on my blood sugars" (pt-stated)       [place] (see longtitudinal plan of care for additional care plan information)  Current Barriers:  . Diabetes: uncontrolled; most recent A1c 7.3%  o Appointment today w/ PCP for groin pain. Reports sharp pain that comes and goes, has been going on for about a week. Worse with activity (walking/doing things). Notes she has stopped exercise since the pain started on Monday. . Current antihyperglycemic regimen: Jardiance 10 mg daily  o APPROVED for patient assistance for Jardiance through 05/10/20 o Hx intolerance to metformin, loose stools  . Current blood glucose readings: o Fastings 120-140s o 2 hour post prandial: always at goal <180  . Cardiovascular risk reduction: o Current hypertensive regimen: hydralazine 10 mg TID, losartan/HCTZ 100/25 mg daily, carvedilol 6.25 mg BID; reports home SBP 130-140s o Current hyperlipidemia regimen: rosuvastatin 20 mg daily; last LDL close to goal <70 given hx CAD/PAD o Current antiplatelet regimen: ASA 325 mg daily; chart review does not reveal hx TIA/CVA, but patient does have PVD, followed by vascular   Pharmacist Clinical Goal(s):  [name] Over the next 90 days, patient will work with PharmD and primary care provider to address optimized glycemic benefit  Interventions: . Comprehensive medication review performed, medication list updated in electronic medical records . Encouraged importance of attending visit w/ Dr. Nicki Reaper this afternoon.  . Reviewed approval for Jardiance assistance, and projected delivery date of tomorrow. Reviewed refill procedure, and that my CPhT will be calling ~next week to confirm delivery. Patient verbalized undersatnding.  . Reviewed goal A1c, goal fasting, and goal 2 hour post prandial readings. Reviewed that if April A1c is not at goal <7%, recommend increasing Jardiance  to 25 mg daily (which can also be obtained from patient assistance program). Patient verbalized understanding   Patient Self Care Activities:  . Patient will check blood glucose BID , document, and provide at future appointments . Patient will take medications as prescribed . Patient will report any questions or concerns to provider   Please see past updates related to this goal by clicking on the "Past Updates" button in the selected goal         Patient verbalizes understanding of instructions provided today.   Plan:  - Scheduled f/u call 09/18/19  Catie Darnelle Maffucci, PharmD, BCACP, CPP Clinical Pharmacist [place] [phone]

## 2019-07-06 NOTE — Chronic Care Management (AMB) (Signed)
Chronic Care Management   Follow Up Note   07/06/2019 Name: Lindsey Bentley MRN: [id number] DOB: July 07, 1945  Referred by: Einar Pheasant, MD Reason for referral : Chronic Care Management (Medication Management)   Lindsey Bentley is a 74 y.o. year old female who is a primary care patient of Einar Pheasant, MD. The CCM team was consulted for assistance with chronic disease management and care coordination needs.    Contacted patient for medication management review.  Review of patient status, including review of consultants reports, relevant laboratory and other test results, and collaboration with appropriate care team members and the patient's provider was performed as part of comprehensive patient evaluation and provision of chronic care management services.    SDOH (Social Determinants of Health) assessments performed: Yes See Care Plan activities for detailed interventions related to SDOH)  SDOH Interventions     Most Recent Value  SDOH Interventions  SDOH Interventions for the Following Domains  Financial Strain  Financial Strain Interventions  Other (Comment) [Patient assistance program approval]       Outpatient Encounter Medications as of 07/06/2019  Medication Sig Note  . acetaminophen (TYLENOL) 650 MG suppository Place 650 mg rectally every 4 (four) hours as needed.   [name] aspirin (BAYER ASPIRIN) 325 MG tablet Take 325 mg by mouth daily.   . carvedilol (COREG) 12.5 MG tablet TAKE 1 TABLET(12.5 MG) BY MOUTH TWICE DAILY WITH MEALS   . Cholecalciferol (VITAMIN D-1000 MAX ST) 1000 units tablet Take 1,000 Units by mouth daily.    . empagliflozin (JARDIANCE) 10 MG TABS tablet Take 10 mg by mouth daily before breakfast.   . hydrALAZINE (APRESOLINE) 10 MG tablet Take 1 tablet (10 mg total) by mouth 3 (three) times daily.   [name] letrozole (FEMARA) 2.5 MG tablet TAKE 1 TABLET BY MOUTH EVERY DAY   . losartan-hydrochlorothiazide (HYZAAR) 100-25 MG tablet Take 1 tablet by mouth daily.  03/23/2019: morning  . omeprazole (PRILOSEC) 40 MG capsule Take 1 capsule (40 mg total) by mouth daily.   . rosuvastatin (CRESTOR) 20 MG tablet Take 1 tablet (20 mg total) by mouth daily.   . vitamin E 400 UNIT capsule Take 400 Units by mouth daily.   . meclizine (ANTIVERT) 25 MG tablet Take 1 tablet (25 mg total) by mouth 3 (three) times daily as needed for dizziness or nausea. (Patient not taking: Reported on 07/06/2019) 03/23/2019: PRN  . PROAIR HFA 108 (90 Base) MCG/ACT inhaler INL 2 INHALATIONS ITL Q 6 H PRF WHZ 03/23/2019: PRN   No facility-administered encounter medications on file as of 07/06/2019.     Objective:   Goals Addressed            This Visit's Progress     Patient Stated   . "I want to work on my blood sugars" (pt-stated)       [place] (see longtitudinal plan of care for additional care plan information)  Current Barriers:  . Diabetes: uncontrolled; most recent A1c 7.3%  o Appointment today w/ PCP for groin pain. Reports sharp pain that comes and goes, has been going on for about a week. Worse with activity (walking/doing things). Notes she has stopped exercise since the pain started on Monday. . Current antihyperglycemic regimen: Jardiance 10 mg daily  o APPROVED for patient assistance for Jardiance through 05/10/20 o Hx intolerance to metformin, loose stools  . Current blood glucose readings: o Fastings 120-140s o 2 hour post prandial: always at goal <180  . Cardiovascular risk  reduction: o Current hypertensive regimen: hydralazine 10 mg TID, losartan/HCTZ 100/25 mg daily, carvedilol 6.25 mg BID; reports home SBP 130-140s o Current hyperlipidemia regimen: rosuvastatin 20 mg daily; last LDL close to goal <70 given hx CAD/PAD o Current antiplatelet regimen: ASA 325 mg daily; chart review does not reveal hx TIA/CVA, but patient does have PVD, followed by vascular   Pharmacist Clinical Goal(s):  [name] Over the next 90 days, patient will work with PharmD and  primary care provider to address optimized glycemic benefit  Interventions: . Comprehensive medication review performed, medication list updated in electronic medical records . Encouraged importance of attending visit w/ Dr. Nicki Reaper this afternoon.  . Reviewed approval for Jardiance assistance, and projected delivery date of tomorrow. Reviewed refill procedure, and that my CPhT will be calling ~next week to confirm delivery. Patient verbalized undersatnding.  . Reviewed goal A1c, goal fasting, and goal 2 hour post prandial readings. Reviewed that if April A1c is not at goal <7%, recommend increasing Jardiance to 25 mg daily (which can also be obtained from patient assistance program). Patient verbalized understanding   Patient Self Care Activities:  . Patient will check blood glucose BID , document, and provide at future appointments . Patient will take medications as prescribed . Patient will report any questions or concerns to provider   Please see past updates related to this goal by clicking on the "Past Updates" button in the selected goal          Plan:  - Scheduled f/u call 09/18/19  Catie Darnelle Maffucci, PharmD, BCACP, [place] Pharmacist [place] [phone]

## 2019-07-06 NOTE — Progress Notes (Signed)
[name], female   DOB: 1945/08/29, 74 y.o.   MRN: [id number]   Subjective:    [name], female    DOB: 05/10/1946, 74 y.o.   MRN: [id number]  HPI This visit occurred during the SARS-CoV-2 public health emergency.  Safety protocols were in place, including screening questions prior to the visit, additional usage of staff PPE, and extensive cleaning of exam room while observing appropriate contact time as indicated for disinfecting solutions.  Patient here for work in appt to discuss groin pain.  Pain started over this past week.  When moves leg a certain way - grabbing pain - upper inner thigh.  No swelling.  No redness.  No known injury.  She was concerned given her history of stent placement.  No abdominal pain.  No leg pain.  Pain localized to upper thigh and is associated with certain position changes and movements.  States otherwise doing well.    Past Medical History:  Diagnosis Date  . Arthritis   . Breast cancer ([place])   . Breast cancer, left ([place]) 2016   LT LUMPECTOMY - TI, NO, MO - IDC, ER/PR pos, Her 2 neg, Rad tx's.   . Carotid artery occlusion   . Carotid artery occlusion   . 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 CHF (congestive heart failure) ([place])   . COPD (chronic obstructive pulmonary disease) ([place])   . Coronary artery disease   . Depression   . Diffuse cystic mastopathy 2013  . DVT (deep venous thrombosis) ([place])   . Dyspnea   . Elevated TSH   . Family history of adverse reaction to anesthesia    Pt stated that son had a seizure with a combination of anesthesia and pain medication."  . Fatty liver   . GERD (gastroesophageal reflux disease)   . Glaucoma   . High cholesterol   . History of chicken pox   . Hyperglycemia   . Hyperlipidemia   . Hypertension   . LVH (left ventricular hypertrophy)   . PAC (premature atrial contraction)   . Palpitations   . Peripheral vascular disease ([place])   . Personal  history of radiation therapy 2016   BREAST CA  . Pneumonia March 2014  . Skin cancer 2013  . Sleep apnea    wears CPAP set at 2.5  . Wears glasses    Past Surgical History:  Procedure Laterality Date  . BACK SURGERY  1986   ruptured disc  . BREAST BIOPSY Left 2008   NEG  . BREAST BIOPSY Left 08-20-14   POS  . BREAST BIOPSY Right 2007   NEG  . BREAST EXCISIONAL BIOPSY Left 1984   NEG  . 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 BREAST SURGERY Left 09/05/2014   T1a,N0; 5 mm  ER/PR positive. HER2 negative.  Wide excision with SLN biopsy.  MammoSite radiation  . CARPAL TUNNEL RELEASE    . CHOLECYSTECTOMY  2006  . COLONOSCOPY  2010   Dr. Jamal Collin  . COLONOSCOPY WITH PROPOFOL N/A 05/17/2017   Procedure: COLONOSCOPY WITH PROPOFOL;  Surgeon: Manya Silvas, MD;  Location: [facility] ENDOSCOPY;  Service: Endoscopy;  Laterality: N/A;  . DILATION AND CURETTAGE OF UTERUS    . ESOPHAGOGASTRODUODENOSCOPY (EGD) WITH PROPOFOL N/A 05/17/2017   Procedure: ESOPHAGOGASTRODUODENOSCOPY (EGD) WITH PROPOFOL;  Surgeon: Manya Silvas, MD;  Location[facility] ENDOSCOPY;  Service: Endoscopy;  Laterality: N/A;  . LAPAROTOMY FOR REMOVAL TUMOR  LUMBAR PLEXES    . Leg stent  2011  . MOLE REMOVAL  2013   15 removed  . POSTERIOR CERVICAL LAMINECTOMY Left 10/15/2015   Procedure: Left Cervical four- five Hemilaminectomy/Remove mass;  Surgeon: Leeroy Cha, MD;  Location: [place] NEURO ORS;  Service: Neurosurgery;  Laterality: Left;  Left C4-5 Hemilaminectomy/Remove mass   Family History  Problem Relation Age of Onset  . Cancer Father        Prostate  . Diabetes Father   . Cerebrovascular Accident Father   . Hypertension Mother   . AAA (abdominal aortic aneurysm) Mother   . Hyperlipidemia Other        Parent  . Miscarriages / Stillbirths Other        Parent  . Hypertension Other        parent  . Heart disease Other        Parent  . Breast cancer Maternal Aunt 72   Social History   Socioeconomic History  .  Marital status: Married    Spouse name: Not on file  . Number of children: 3  . Years of education: 48  . Highest education level: Not on file  Occupational History  . Occupation: Caregiver   Tobacco Use  . Smoking status: Never Smoker  . Smokeless tobacco: Never Used  Substance and Sexual Activity  . Alcohol use: No    Alcohol/week: 0.0 standard drinks  . Drug use: No  . Sexual activity: Never  Other Topics Concern  . Not on file  Social History Narrative   Regular exercise-mo   Caffeine Use-no   Social Determinants of Health   Financial Resource Strain: Medium Risk  . Difficulty of Paying Living Expenses: Somewhat hard  Food Insecurity: No Food Insecurity  . Worried About Charity fundraiser in the Last Year: Never true  . Ran Out of Food in the Last Year: Never true  Transportation Needs: No Transportation Needs  . Lack of Transportation (Medical): No  . Lack of Transportation (Non-Medical): No  Physical Activity: Sufficiently Active  . Days of Exercise per Week: 7 days  . Minutes of Exercise per Session: 30 min  Stress: No Stress Concern Present  . Feeling of Stress : Not at all  Social Connections:   . Frequency of Communication with Friends and Family: Not on file  . Frequency of Social Gatherings with Friends and Family: Not on file  . Attends Religious Services: Not on file  . Active Member of Clubs or Organizations: Not on file  . Attends Archivist Meetings: Not on file  . Marital Status: Not on file    Outpatient Encounter Medications as of 07/06/2019  Medication Sig  . acetaminophen (TYLENOL) 650 MG suppository Place 650 mg rectally every 4 (four) hours as needed.  [name] aspirin (BAYER ASPIRIN) 325 MG tablet Take 325 mg by mouth daily.  . carvedilol (COREG) 12.5 MG tablet TAKE 1 TABLET(12.5 MG) BY MOUTH TWICE DAILY WITH MEALS  . Cholecalciferol (VITAMIN D-1000 MAX ST) 1000 units tablet Take 1,000 Units by mouth daily.   . empagliflozin (JARDIANCE)  10 MG TABS tablet Take 10 mg by mouth daily before breakfast.  . hydrALAZINE (APRESOLINE) 10 MG tablet Take 1 tablet (10 mg total) by mouth 3 (three) times daily.  [name] letrozole (FEMARA) 2.5 MG tablet TAKE 1 TABLET BY MOUTH EVERY DAY  . losartan-hydrochlorothiazide (HYZAAR) 100-25 MG tablet Take 1 tablet by mouth daily.  . meclizine (ANTIVERT) 25 MG tablet Take 1  tablet (25 mg total) by mouth 3 (three) times daily as needed for dizziness or nausea.  [name] omeprazole (PRILOSEC) 40 MG capsule Take 1 capsule (40 mg total) by mouth daily.  [name] PROAIR HFA 108 (90 Base) MCG/ACT inhaler INL 2 INHALATIONS ITL Q 6 H PRF WHZ  . rosuvastatin (CRESTOR) 20 MG tablet Take 1 tablet (20 mg total) by mouth daily.  . vitamin E 400 UNIT capsule Take 400 Units by mouth daily.   No facility-administered encounter medications on file as of 07/06/2019.   Review of Systems  Constitutional: Negative for appetite change and unexpected weight change.  Respiratory: Negative for cough, chest tightness and shortness of breath.   Cardiovascular: Negative for chest pain and leg swelling.  Gastrointestinal: Negative for abdominal pain, diarrhea, nausea and vomiting.  Musculoskeletal: Negative for back pain and joint swelling.       Right upper thigh pain as outlined.    Skin: Negative for color change and rash.  Neurological: Negative for dizziness and headaches.  Psychiatric/Behavioral: Negative for agitation and dysphoric mood.       Objective:    Physical Exam Constitutional:      General: She is not in acute distress.    Appearance: Normal appearance.  HENT:     Right Ear: External ear normal.     Left Ear: External ear normal.  Neck:     Thyroid: No thyromegaly.  Cardiovascular:     Rate and Rhythm: Normal rate and regular rhythm.  Pulmonary:     Effort: No respiratory distress.     Breath sounds: Normal breath sounds. No wheezing.  Abdominal:     General: Bowel sounds are normal.     Palpations: Abdomen is  soft.     Tenderness: There is no abdominal tenderness.  Musculoskeletal:        General: No swelling.     Cervical back: Neck supple. No tenderness.     Comments: Increased pain - right inner groin/thigh - noticed with certain movements and position changes - with abduction and palpation.    Lymphadenopathy:     Cervical: No cervical adenopathy.  Neurological:     Mental Status: She is alert.     BP 130/64   Pulse 67   Temp (!) 96.2 F (35.7 C)   Resp 16   Wt 205 lb (93 kg)   SpO2 97%   BMI 34.64 kg/m  Wt Readings from Last 3 Encounters:  07/06/19 205 lb (93 kg)  05/18/19 204 lb (92.5 kg)  02/05/19 209 lb (94.8 kg)     Lab Results  Component Value Date   WBC 5.5 01/27/2019   HGB 12.2 01/27/2019   HCT 35.5 (L) 01/27/2019   PLT 180.0 01/27/2019   GLUCOSE 150 (H) 05/17/2019   CHOL 140 05/17/2019   TRIG 209.0 (H) 05/17/2019   HDL 37.80 (L) 05/17/2019   LDLDIRECT 78.0 05/17/2019   LDLCALC 64 04/15/2017   ALT 23 05/17/2019   AST 14 05/17/2019   NA 138 05/17/2019   K 3.9 05/17/2019   CL 105 05/17/2019   CREATININE 1.03 05/17/2019   BUN 27 (H) 05/17/2019   CO2 24 05/17/2019   TSH 4.34 05/17/2019   INR 0.9 07/13/2012   HGBA1C 7.3 (H) 05/17/2019   MICROALBUR 1.2 05/17/2019    MM DIAG BREAST TOMO BILATERAL  Result Date: 10/26/2018 CLINICAL DATA:  History of treated left breast cancer, status post lumpectomy and radiation therapy in 2016. EXAM: DIGITAL DIAGNOSTIC BILATERAL MAMMOGRAM WITH  CAD AND TOMO COMPARISON:  Previous exam(s). ACR Breast Density Category b: There are scattered areas of fibroglandular density. FINDINGS: Mammographically, there are no suspicious masses, areas of nonsurgical architectural distortion or microcalcifications in either breast. Stable posttreatment changes in the left breast. Mammographic images were processed with CAD. IMPRESSION: No mammographic evidence of malignancy in either breast, status post left lumpectomy. RECOMMENDATION:  Diagnostic mammogram is suggested in 1 year. (Code:DM-B-01Y) I have discussed the findings and recommendations with the patient. Results were also provided in writing at the conclusion of the visit. If applicable, a reminder letter will be sent to the patient regarding the next appointment. BI-RADS CATEGORY  2: Benign. Electronically Signed   By: Fidela Salisbury M.D.   On: 10/26/2018 14:49       Assessment & Plan:   Problem List Items Addressed This Visit    Right groin pain    Pain reproducible with certain position changes and movements.  Appears to be more msk in origin.  DP pulses palpable and equal bilaterally.  No redness or swelling.  Treat conservatively.  Tylenol scheduled.  Stretches.  Follow.  Notify me if any change or if problem persist.           Einar Pheasant, MD

## 2019-07-06 NOTE — Progress Notes (Signed)
Reviewed.    Dr Kaytee Taliercio 

## 2019-07-09 ENCOUNTER — Encounter: Payer: Self-pay | Admitting: Internal Medicine

## 2019-07-09 DIAGNOSIS — R1031 Right lower quadrant pain: Secondary | ICD-10-CM | POA: Insufficient documentation

## 2019-07-09 NOTE — Assessment & Plan Note (Signed)
Pain reproducible with certain position changes and movements.  Appears to be more msk in origin.  DP pulses palpable and equal bilaterally.  No redness or swelling.  Treat conservatively.  Tylenol scheduled.  Stretches.  Follow.  Notify me if any change or if problem persist.

## 2019-07-13 ENCOUNTER — Other Ambulatory Visit: Payer: Self-pay | Admitting: Pharmacy Technician

## 2019-07-13 NOTE — Patient Outreach (Signed)
[facility]) Care Management  07/13/2019  Lindsey Bentley [birth year] [id number]     Successful call placed to patient regarding patient assistance medication delivery of Jardiance from [facility], HIPAA identifiers verified.   Patient informed she received a 90 days supply of Jardiance. Discussed refill procedure with patient. Patient was able to locate the phone number on the packing slip to use when refills are needed. Informed patient to start the refill process when she has around 2 week supply remaining. Patient verbalized understanding. Confirmed patient had name and number.  Patient inquired if I would be able to send a message to Dr. Nicki Reaper concerning leg pain she presented to the office last week with. She informed she wanted Dr. Nicki Reaper to know that the pain had gone away and that she was doing the exercises that Dr. Nicki Reaper had told her to do. She informed she believed Dr. Nicki Reaper was correct when she informed her that it was muscular related. Inbasket message sent to Dr. Nicki Reaper and embedded [facility] RPh Catie Darnelle Maffucci.  Follow up:  Will route note to embedded [place] for case closure as patient assistance has been completed and will remove myself from care team.  [name]. Lindsey Bentley, [place]  [phone]

## 2019-07-14 ENCOUNTER — Telehealth: Payer: Self-pay | Admitting: Internal Medicine

## 2019-07-14 NOTE — Telephone Encounter (Signed)
-----   Message from Jason Fila, CPhT sent at 07/13/2019 10:23 AM EST ----- Regarding: message from patient Good morning, I was following up to inquire if patient had received Jardiance from the patient assistance foundation and she mentioned that she was seen in the office for leg pain and inquired if I could update Dr. Nicki Reaper.  She informed the she has not had any pain this week from the leg pain she presented to the office about last week. She informed that she has been doing the exercises. She informed it appeared to be muscular as Dr. Nicki Reaper had diagnosed.  She can be reached at her home number if Dr. Nicki Reaper needs to speak with her again.  Thanks for your time this morning, Jill P. [place], [place]  [phone]

## 2019-07-28 DIAGNOSIS — G4733 Obstructive sleep apnea (adult) (pediatric): Secondary | ICD-10-CM | POA: Diagnosis not present

## 2019-08-11 DIAGNOSIS — G4733 Obstructive sleep apnea (adult) (pediatric): Secondary | ICD-10-CM | POA: Diagnosis not present

## 2019-08-28 ENCOUNTER — Other Ambulatory Visit: Payer: Self-pay | Admitting: Internal Medicine

## 2019-08-28 DIAGNOSIS — G4733 Obstructive sleep apnea (adult) (pediatric): Secondary | ICD-10-CM | POA: Diagnosis not present

## 2019-09-06 ENCOUNTER — Telehealth: Payer: Self-pay | Admitting: Internal Medicine

## 2019-09-06 ENCOUNTER — Other Ambulatory Visit: Payer: Self-pay

## 2019-09-06 ENCOUNTER — Other Ambulatory Visit (INDEPENDENT_AMBULATORY_CARE_PROVIDER_SITE_OTHER): Payer: Medicare Other

## 2019-09-06 DIAGNOSIS — E7800 Pure hypercholesterolemia, unspecified: Secondary | ICD-10-CM | POA: Diagnosis not present

## 2019-09-06 DIAGNOSIS — E1159 Type 2 diabetes mellitus with other circulatory complications: Secondary | ICD-10-CM | POA: Diagnosis not present

## 2019-09-06 LAB — LIPID PANEL
Cholesterol: 165 mg/dL (ref 0–200)
HDL: 37.3 mg/dL — ABNORMAL LOW (ref >39.00)
NonHDL: 127.91
Total CHOL/HDL Ratio: 4
Triglycerides: 255 mg/dL — ABNORMAL HIGH (ref 0.0–149.0)
VLDL: 51 mg/dL — ABNORMAL HIGH (ref 0.0–40.0)

## 2019-09-06 LAB — HEPATIC FUNCTION PANEL
ALT: 21 U/L (ref 0–35)
AST: 15 U/L (ref 0–37)
Albumin: 4.3 g/dL (ref 3.5–5.2)
Alkaline Phosphatase: 61 U/L (ref 39–117)
Bilirubin, Direct: 0.1 mg/dL (ref 0.0–0.3)
Total Bilirubin: 0.6 mg/dL (ref 0.2–1.2)
Total Protein: 7.2 g/dL (ref 6.0–8.3)

## 2019-09-06 LAB — BASIC METABOLIC PANEL
BUN: 32 mg/dL — ABNORMAL HIGH (ref 6–23)
CO2: 23 mEq/L (ref 19–32)
Calcium: 9.5 mg/dL (ref 8.4–10.5)
Chloride: 101 mEq/L (ref 96–112)
Creatinine, Ser: 0.98 mg/dL (ref 0.40–1.20)
GFR: 55.32 mL/min — ABNORMAL LOW (ref >60.00)
Glucose, Bld: 138 mg/dL — ABNORMAL HIGH (ref 70–99)
Potassium: 3.5 mEq/L (ref 3.5–5.1)
Sodium: 135 mEq/L (ref 135–145)

## 2019-09-06 LAB — LDL CHOLESTEROL, DIRECT: Direct LDL: 93 mg/dL

## 2019-09-06 LAB — HEMOGLOBIN A1C: Hgb A1c MFr Bld: 7.4 % — ABNORMAL HIGH (ref 4.6–6.5)

## 2019-09-06 MED ORDER — LOSARTAN POTASSIUM-HCTZ 100-25 MG PO TABS: 1.0000 | ORAL_TABLET | Freq: Every day | ORAL | 1 refills | Status: DC

## 2019-09-06 MED ORDER — CARVEDILOL 12.5 MG PO TABS: ORAL_TABLET | ORAL | 1 refills | Status: DC

## 2019-09-06 NOTE — Telephone Encounter (Signed)
Patient needs the following refills; losartan-hydrochlorothiazide (HYZAAR) 100-25 MG tablet and carvedilol (COREG) 12.5 MG tablet.

## 2019-09-08 ENCOUNTER — Encounter: Payer: Medicare Other | Admitting: Internal Medicine

## 2019-09-12 ENCOUNTER — Other Ambulatory Visit: Payer: Self-pay

## 2019-09-12 DIAGNOSIS — Z170 Estrogen receptor positive status [ER+]: Secondary | ICD-10-CM

## 2019-09-12 DIAGNOSIS — C50512 Malignant neoplasm of lower-outer quadrant of left female breast: Secondary | ICD-10-CM

## 2019-09-18 ENCOUNTER — Ambulatory Visit (INDEPENDENT_AMBULATORY_CARE_PROVIDER_SITE_OTHER): Payer: Medicare Other | Admitting: Pharmacist

## 2019-09-18 DIAGNOSIS — E1159 Type 2 diabetes mellitus with other circulatory complications: Secondary | ICD-10-CM | POA: Diagnosis not present

## 2019-09-18 DIAGNOSIS — I739 Peripheral vascular disease, unspecified: Secondary | ICD-10-CM

## 2019-09-18 DIAGNOSIS — I25119 Atherosclerotic heart disease of native coronary artery with unspecified angina pectoris: Secondary | ICD-10-CM

## 2019-09-18 DIAGNOSIS — I6523 Occlusion and stenosis of bilateral carotid arteries: Secondary | ICD-10-CM

## 2019-09-18 NOTE — Patient Instructions (Signed)
Visit Information  Goals Addressed            This Visit's Progress     Patient Stated   . "I want to work on my blood sugars" (pt-stated)       [place] (see longtitudinal plan of care for additional care plan information)  Current Barriers:  . Diabetes: uncontrolled; most recent A1c 7.4% o Notes that she has had a GI bug over the last few days. Also reports that her brother passed away recently, which has been a stressor for her (with time in the hospital and in Hospice before) . Current antihyperglycemic regimen: Jardiance 10 mg daily  o APPROVED for patient assistance for Jardiance through 05/10/20 o Hx intolerance to metformin, loose stools  . Current blood glucose readings: o Reports that she has not been taking sugars while having GI bug . Cardiovascular risk reduction: o Current hypertensive regimen: hydralazine 10 mg TID, losartan/HCTZ 100/25 mg daily, carvedilol 6.25 mg BID;  o Current hyperlipidemia regimen: rosuvastatin 20 mg daily; last LDL NOT at goal o Current antiplatelet regimen: ASA 325 mg daily; chart review does not reveal hx TIA/CVA, but patient does have PVD, followed by vascular   Pharmacist Clinical Goal(s):  [name] Over the next 90 days, patient will work with PharmD and primary care provider to address optimized glycemic benefit  Interventions: . Comprehensive medication review performed, medication list updated in electronic medical record . Inter-disciplinary care team collaboration (see longitudinal plan of care) . Reviewed importance of BG checks to evaluate glucose control. Encouraged to check fasting and 2 hour post prandial between now and next appointment w/ Dr. Nicki Reaper. Patient verbalized understanding.  . Discussed increasing Jardiance to 25 mg daily. Patient would like to defer until her appointment with Dr. Nicki Reaper. If dose increased, I can call prescription into [facility] patient assistance pharmacy . Discussed goal LDL given ASCVD risk;  recommend increasing rosuvastatin to 40 mg daily. Patient hesitant, but we discussed that she has been tolerating rosuvastatin well. If dose increased not tolerated, we discussed that we could reduce to 20 mg daily and add something different (ie ezetimibe).   Patient Self Care Activities:  . Patient will check blood glucose BID , document, and provide at future appointments . Patient will take medications as prescribed . Patient will report any questions or concerns to provider   Please see past updates related to this goal by clicking on the "Past Updates" button in the selected goal         Patient verbalizes understanding of instructions provided today.    Plan:  - Scheduled f/u call in ~ 6 weeks  Catie Darnelle Maffucci, PharmD, [place], [place] Pharmacist [place] [phone]

## 2019-09-18 NOTE — Progress Notes (Signed)
Reviewed information.  Agree with plan.    Dr Nicki Reaper

## 2019-09-18 NOTE — Chronic Care Management (AMB) (Signed)
Chronic Care Management   Follow Up Note   09/18/2019 Name: Lindsey Bentley MRN: [id number] DOB: March 01, 1946  Referred by: Lindsey Pheasant, MD Reason for referral : Chronic Care Management (Medication Management)   Lindsey Bentley is a 74 y.o. year old female who is a primary care patient of Lindsey Pheasant, MD. The CCM team was consulted for assistance with chronic disease management and care coordination needs.    Contacted patient for medication management review.   Review of patient status, including review of consultants reports, relevant laboratory and other test results, and collaboration with appropriate care team members and the patient's provider was performed as part of comprehensive patient evaluation and provision of chronic care management services.    SDOH (Social Determinants of Health) assessments performed: Yes See Care Plan activities for detailed interventions related to [facility])     Outpatient Encounter Medications as of 09/18/2019  Medication Sig Note  . acetaminophen (TYLENOL) 650 MG suppository Place 650 mg rectally every 4 (four) hours as needed.   [name] aspirin (BAYER ASPIRIN) 325 MG tablet Take 325 mg by mouth daily.   . carvedilol (COREG) 12.5 MG tablet One tab po BID with meals   . Cholecalciferol (VITAMIN D-1000 MAX ST) 1000 units tablet Take 1,000 Units by mouth daily.    . empagliflozin (JARDIANCE) 10 MG TABS tablet Take 10 mg by mouth daily before breakfast.   . hydrALAZINE (APRESOLINE) 10 MG tablet Take 1 tablet (10 mg total) by mouth 3 (three) times daily.   [name] letrozole (FEMARA) 2.5 MG tablet TAKE 1 TABLET BY MOUTH EVERY DAY   . losartan-hydrochlorothiazide (HYZAAR) 100-25 MG tablet Take 1 tablet by mouth daily.   . meclizine (ANTIVERT) 25 MG tablet Take 1 tablet (25 mg total) by mouth 3 (three) times daily as needed for dizziness or nausea. 03/23/2019: PRN  . omeprazole (PRILOSEC) 40 MG capsule Take 1 capsule (40 mg total) by mouth daily.   [name] PROAIR HFA 108  (90 Base) MCG/ACT inhaler INL 2 INHALATIONS ITL Q 6 H PRF WHZ 03/23/2019: PRN  . rosuvastatin (CRESTOR) 20 MG tablet Take 1 tablet (20 mg total) by mouth daily.   . vitamin E 400 UNIT capsule Take 400 Units by mouth daily.    No facility-administered encounter medications on file as of 09/18/2019.     Objective:   Goals Addressed            This Visit's Progress     Patient Stated   . "I want to work on my blood sugars" (pt-stated)       [name] (see longtitudinal plan of care for additional care plan information)  Current Barriers:  . Diabetes: uncontrolled; most recent A1c 7.4% o Notes that she has had a GI bug over the last few days. Also reports that her brother passed away recently, which has been a stressor for her (with time in the hospital and in Hospice before) . Current antihyperglycemic regimen: Jardiance 10 mg daily  o APPROVED for patient assistance for Jardiance through 05/10/20 o Hx intolerance to metformin, loose stools  . Current blood glucose readings: o Reports that she has not been taking sugars while having GI bug . Cardiovascular risk reduction: o Current hypertensive regimen: hydralazine 10 mg TID, losartan/HCTZ 100/25 mg daily, carvedilol 6.25 mg BID;  o Current hyperlipidemia regimen: rosuvastatin 20 mg daily; last LDL NOT at goal o Current antiplatelet regimen: ASA 325 mg daily; chart review does not reveal hx TIA/CVA, but patient  does have PVD, followed by vascular   Pharmacist Clinical Goal(s):  [name] Over the next 90 days, patient will work with PharmD and primary care provider to address optimized glycemic benefit  Interventions: . Comprehensive medication review performed, medication list updated in electronic medical record . Inter-disciplinary care team collaboration (see longitudinal plan of care) . Reviewed importance of BG checks to evaluate glucose control. Encouraged to check fasting and 2 hour post prandial between now and next  appointment w/ Dr. Nicki Reaper. Patient verbalized understanding.  . Discussed increasing Jardiance to 25 mg daily. Patient would like to defer until her appointment with Dr. Nicki Reaper. If dose increased, I can call prescription into [facility] patient assistance pharmacy . Discussed goal LDL given ASCVD risk; recommend increasing rosuvastatin to 40 mg daily. Patient hesitant, but we discussed that she has been tolerating rosuvastatin well. If dose increased not tolerated, we discussed that we could reduce to 20 mg daily and add something different (ie ezetimibe).   Patient Self Care Activities:  . Patient will check blood glucose BID , document, and provide at future appointments . Patient will take medications as prescribed . Patient will report any questions or concerns to provider   Please see past updates related to this goal by clicking on the "Past Updates" button in the selected goal          Plan:  - Scheduled f/u call in ~ 6 weeks  Catie Darnelle Maffucci, PharmD, [place], [place] Pharmacist [place] [phone]

## 2019-09-21 ENCOUNTER — Other Ambulatory Visit: Payer: Self-pay | Admitting: General Surgery

## 2019-09-21 DIAGNOSIS — C50919 Malignant neoplasm of unspecified site of unspecified female breast: Secondary | ICD-10-CM

## 2019-09-27 ENCOUNTER — Other Ambulatory Visit: Payer: Self-pay | Admitting: Internal Medicine

## 2019-09-27 DIAGNOSIS — G4733 Obstructive sleep apnea (adult) (pediatric): Secondary | ICD-10-CM | POA: Diagnosis not present

## 2019-09-28 ENCOUNTER — Other Ambulatory Visit: Payer: Self-pay

## 2019-09-28 ENCOUNTER — Ambulatory Visit (INDEPENDENT_AMBULATORY_CARE_PROVIDER_SITE_OTHER): Payer: Medicare Other | Admitting: Internal Medicine

## 2019-09-28 VITALS — BP 130/70 | HR 75 | Temp 96.9°F | Resp 16 | Ht 65.0 in | Wt 202.6 lb

## 2019-09-28 DIAGNOSIS — F439 Reaction to severe stress, unspecified: Secondary | ICD-10-CM | POA: Diagnosis not present

## 2019-09-28 DIAGNOSIS — J449 Chronic obstructive pulmonary disease, unspecified: Secondary | ICD-10-CM

## 2019-09-28 DIAGNOSIS — R1031 Right lower quadrant pain: Secondary | ICD-10-CM | POA: Diagnosis not present

## 2019-09-28 DIAGNOSIS — R234 Changes in skin texture: Secondary | ICD-10-CM

## 2019-09-28 DIAGNOSIS — I10 Essential (primary) hypertension: Secondary | ICD-10-CM | POA: Diagnosis not present

## 2019-09-28 DIAGNOSIS — E7800 Pure hypercholesterolemia, unspecified: Secondary | ICD-10-CM

## 2019-09-28 DIAGNOSIS — I739 Peripheral vascular disease, unspecified: Secondary | ICD-10-CM | POA: Diagnosis not present

## 2019-09-28 DIAGNOSIS — Z0000 Encounter for general adult medical examination without abnormal findings: Secondary | ICD-10-CM

## 2019-09-28 DIAGNOSIS — K219 Gastro-esophageal reflux disease without esophagitis: Secondary | ICD-10-CM

## 2019-09-28 DIAGNOSIS — Z86010 Personal history of colonic polyps: Secondary | ICD-10-CM

## 2019-09-28 DIAGNOSIS — C50912 Malignant neoplasm of unspecified site of left female breast: Secondary | ICD-10-CM

## 2019-09-28 DIAGNOSIS — E1159 Type 2 diabetes mellitus with other circulatory complications: Secondary | ICD-10-CM

## 2019-09-28 NOTE — Progress Notes (Addendum)
[name], female   DOB: [birth year], 74 y.o.   MRN: [id number]   Subjective:    [name], female    DOB: [birth year], 74 y.o.   MRN: [id number]  HPI This visit occurred during the SARS-CoV-2 public health emergency.  Safety protocols were in place, including screening questions prior to the visit, additional usage of staff PPE, and extensive cleaning of exam room while observing appropriate contact time as indicated for disinfecting solutions.  Patient here for her physical exam.  She reports she is doing relatively well.  Recently evaluated for right groin pain.  Resolved.  Not an issue for her now.  Stays active.  No chest pain or sob with increased activity or exertion.  No increased cough or congestion.  Increased stress.  Brother passed.  Also, husband has dementia.  Discussed with her today.  Overall handling things relatively well. Does not feel needs any further intervention.  Previous diarrhea and emesis.  Lasted several days.  This occurred approximately two weeks ago.  No symptoms now.  No abdominal pain, nausea or vomiting.  No bowel change.  Feels back to normal now.  Taking Jardiance.  Last a1c 7.4.  Discussed additional medication.  States sugars averaging in pm 130-150s.  Desires no further medication.  She will schedule mammogram.     Past Medical History:  Diagnosis Date  . Arthritis   . Breast cancer ([place])   . Breast cancer, left ([place]) 2016   LT LUMPECTOMY - TI, NO, MO - IDC, ER/PR pos, Her 2 neg, Rad tx's.   . Carotid artery occlusion   . Carotid artery occlusion   . 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 CHF (congestive heart failure) ([place])   . COPD (chronic obstructive pulmonary disease) ([place])   . Coronary artery disease   . Depression   . Diffuse cystic mastopathy 2013  . DVT (deep venous thrombosis) ([place])   . Dyspnea   . Elevated TSH   . Family history of adverse reaction to anesthesia    Pt stated that  son had a seizure with a combination of anesthesia and pain medication."  . Fatty liver   . GERD (gastroesophageal reflux disease)   . Glaucoma   . High cholesterol   . History of chicken pox   . Hyperglycemia   . Hyperlipidemia   . Hypertension   . LVH (left ventricular hypertrophy)   . PAC (premature atrial contraction)   . Palpitations   . Peripheral vascular disease ([name])   . Personal history of radiation therapy 2016   BREAST CA  . Pneumonia March 2014  . Skin cancer 2013  . Sleep apnea    wears CPAP set at 2.5  . Wears glasses    Past Surgical History:  Procedure Laterality Date  . BACK SURGERY  1986   ruptured disc  . BREAST BIOPSY Left 2008   NEG  . BREAST BIOPSY Left 08-20-14   POS  . BREAST BIOPSY Right 2007   NEG  . BREAST EXCISIONAL BIOPSY Left 1984   NEG  . 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 BREAST SURGERY Left 09/05/2014   T1a,N0; 5 mm  ER/PR positive. HER2 negative.  Wide excision with SLN biopsy.  MammoSite radiation  . CARPAL TUNNEL RELEASE    . CHOLECYSTECTOMY  2006  . COLONOSCOPY  2010   Dr. Jamal Collin  . COLONOSCOPY WITH PROPOFOL N/A 05/17/2017  Procedure: COLONOSCOPY WITH PROPOFOL;  Surgeon: Manya Silvas, MD;  Location: [facility] ENDOSCOPY;  Service: Endoscopy;  Laterality: N/A;  . DILATION AND CURETTAGE OF UTERUS    . ESOPHAGOGASTRODUODENOSCOPY (EGD) WITH PROPOFOL N/A 05/17/2017   Procedure: ESOPHAGOGASTRODUODENOSCOPY (EGD) WITH PROPOFOL;  Surgeon: Manya Silvas, MD;  Location: [facility] ENDOSCOPY;  Service: Endoscopy;  Laterality: N/A;  . LAPAROTOMY FOR REMOVAL TUMOR LUMBAR PLEXES    . Leg stent  2011  . MOLE REMOVAL  2013   15 removed  . POSTERIOR CERVICAL LAMINECTOMY Left 10/15/2015   Procedure: Left Cervical four- five Hemilaminectomy/Remove mass;  Surgeon: Leeroy Cha, MD;  Location: [facility] NEURO ORS;  Service: Neurosurgery;  Laterality: Left;  Left C4-5 Hemilaminectomy/Remove mass   Family History  Problem Relation Age of Onset  . Cancer  Father        Prostate  . Diabetes Father   . Cerebrovascular Accident Father   . Hypertension Mother   . AAA (abdominal aortic aneurysm) Mother   . Hyperlipidemia Other        Parent  . Miscarriages / Stillbirths Other        Parent  . Hypertension Other        parent  . Heart disease Other        Parent  . Breast cancer Maternal Aunt 72   Social History   Socioeconomic History  . Marital status: Married    Spouse name: Not on file  . Number of children: 3  . Years of education: 8  . Highest education level: Not on file  Occupational History  . Occupation: Caregiver   Tobacco Use  . Smoking status: Never Smoker  . Smokeless tobacco: Never Used  Substance and Sexual Activity  . Alcohol use: No    Alcohol/week: 0.0 standard drinks  . Drug use: No  . Sexual activity: Never  Other Topics Concern  . Not on file  Social History Narrative   Regular exercise-mo   Caffeine Use-no   Social Determinants of Health   Financial Resource Strain: Medium Risk  . Difficulty of Paying Living Expenses: Somewhat hard  Food Insecurity: No Food Insecurity  . Worried About Charity fundraiser in the Last Year: Never true  . Ran Out of Food in the Last Year: Never true  Transportation Needs: No Transportation Needs  . Lack of Transportation (Medical): No  . Lack of Transportation (Non-Medical): No  Physical Activity: Sufficiently Active  . Days of Exercise per Week: 7 days  . Minutes of Exercise per Session: 30 min  Stress: No Stress Concern Present  . Feeling of Stress : Not at all  Social Connections:   . Frequency of Communication with Friends and Family:   . Frequency of Social Gatherings with Friends and Family:   . Attends Religious Services:   . Active Member of Clubs or Organizations:   . Attends Archivist Meetings:   [name] Marital Status:     Outpatient Encounter Medications as of 09/28/2019  Medication Sig  . acetaminophen (TYLENOL) 650 MG suppository Place  650 mg rectally every 4 (four) hours as needed.  [name] aspirin (BAYER ASPIRIN) 325 MG tablet Take 325 mg by mouth daily.  . carvedilol (COREG) 12.5 MG tablet One tab po BID with meals  . Cholecalciferol (VITAMIN D-1000 MAX ST) 1000 units tablet Take 1,000 Units by mouth daily.   . empagliflozin (JARDIANCE) 10 MG TABS tablet Take 10 mg by mouth daily before breakfast.  . hydrALAZINE (APRESOLINE) 10  MG tablet TAKE 1 TABLET(10 MG) BY MOUTH THREE TIMES DAILY  . letrozole (FEMARA) 2.5 MG tablet TAKE 1 TABLET BY MOUTH EVERY DAY  . losartan-hydrochlorothiazide (HYZAAR) 100-25 MG tablet Take 1 tablet by mouth daily.  . meclizine (ANTIVERT) 25 MG tablet Take 1 tablet (25 mg total) by mouth 3 (three) times daily as needed for dizziness or nausea.  [name] omeprazole (PRILOSEC) 40 MG capsule Take 1 capsule (40 mg total) by mouth daily.  [name] PROAIR HFA 108 (90 Base) MCG/ACT inhaler INL 2 INHALATIONS ITL Q 6 H PRF WHZ  . rosuvastatin (CRESTOR) 20 MG tablet TAKE 1 TABLET(20 MG) BY MOUTH DAILY  . vitamin E 400 UNIT capsule Take 400 Units by mouth daily.   No facility-administered encounter medications on file as of 09/28/2019.    Review of Systems  Constitutional: Negative for appetite change and unexpected weight change.  HENT: Negative for congestion and sinus pressure.   Eyes: Negative for pain and visual disturbance.  Respiratory: Negative for cough, chest tightness and shortness of breath.   Cardiovascular: Negative for chest pain, palpitations and leg swelling.  Gastrointestinal: Negative for abdominal pain, diarrhea, nausea and vomiting.  Genitourinary: Negative for difficulty urinating and dysuria.  Musculoskeletal: Negative for joint swelling and myalgias.  Skin: Negative for color change and rash.  Neurological: Negative for dizziness, light-headedness and headaches.  Hematological: Negative for adenopathy. Does not bruise/bleed easily.  Psychiatric/Behavioral: Negative for agitation and dysphoric mood.         Increased stress as outlined.         Objective:    Physical Exam Vitals reviewed.  Constitutional:      General: She is not in acute distress.    Appearance: Normal appearance. She is well-developed.  HENT:     Head: Normocephalic and atraumatic.     Right Ear: External ear normal.     Left Ear: External ear normal.  Eyes:     General: No scleral icterus.       Right eye: No discharge.        Left eye: No discharge.     Conjunctiva/sclera: Conjunctivae normal.  Neck:     Thyroid: No thyromegaly.  Cardiovascular:     Rate and Rhythm: Normal rate and regular rhythm.  Pulmonary:     Effort: No tachypnea, accessory muscle usage or respiratory distress.     Breath sounds: Normal breath sounds. No decreased breath sounds or wheezing.  Chest:     Breasts:        Right: No inverted nipple, mass, nipple discharge or tenderness (no axillary adenopathy).        Left: No inverted nipple, mass, nipple discharge or tenderness (no axilarry adenopathy).  Abdominal:     General: Bowel sounds are normal.     Palpations: Abdomen is soft.     Tenderness: There is no abdominal tenderness.  Musculoskeletal:        General: No swelling or tenderness.     Cervical back: Neck supple. No tenderness.  Lymphadenopathy:     Cervical: No cervical adenopathy.  Skin:    Findings: No erythema or rash.  Neurological:     Mental Status: She is alert and oriented to person, place, and time.  Psychiatric:        Mood and Affect: Mood normal.        Behavior: Behavior normal.     BP 130/70   Pulse 75   Temp (!) 96.9 F (36.1 C)   Resp  16   Ht '5\' 5"'$  (1.651 m)   Wt 202 lb 9.6 oz (91.9 kg)   SpO2 97%   BMI 33.71 kg/m  Wt Readings from Last 3 Encounters:  09/28/19 202 lb 9.6 oz (91.9 kg)  07/06/19 205 lb (93 kg)  05/18/19 204 lb (92.5 kg)     Lab Results  Component Value Date   WBC 5.5 01/27/2019   HGB 12.2 01/27/2019   HCT 35.5 (L) 01/27/2019   PLT 180.0 01/27/2019   GLUCOSE  138 (H) 09/06/2019   CHOL 165 09/06/2019   TRIG 255.0 (H) 09/06/2019   HDL 37.30 (L) 09/06/2019   LDLDIRECT 93.0 09/06/2019   LDLCALC 64 04/15/2017   ALT 21 09/06/2019   AST 15 09/06/2019   NA 135 09/06/2019   K 3.5 09/06/2019   CL 101 09/06/2019   CREATININE 0.98 09/06/2019   BUN 32 (H) 09/06/2019   CO2 23 09/06/2019   TSH 4.34 05/17/2019   INR 0.9 07/13/2012   HGBA1C 7.4 (H) 09/06/2019   MICROALBUR 1.2 05/17/2019    MM DIAG BREAST TOMO BILATERAL  Result Date: 10/26/2018 CLINICAL DATA:  History of treated left breast cancer, status post lumpectomy and radiation therapy in 2016. EXAM: DIGITAL DIAGNOSTIC BILATERAL MAMMOGRAM WITH CAD AND TOMO COMPARISON:  Previous exam(s). ACR Breast Density Category b: There are scattered areas of fibroglandular density. FINDINGS: Mammographically, there are no suspicious masses, areas of nonsurgical architectural distortion or microcalcifications in either breast. Stable posttreatment changes in the left breast. Mammographic images were processed with CAD. IMPRESSION: No mammographic evidence of malignancy in either breast, status post left lumpectomy. RECOMMENDATION: Diagnostic mammogram is suggested in 1 year. (Code:DM-B-01Y) I have discussed the findings and recommendations with the patient. Results were also provided in writing at the conclusion of the visit. If applicable, a reminder letter will be sent to the patient regarding the next appointment. BI-RADS CATEGORY  2: Benign. Electronically Signed   By: Fidela Salisbury M.D.   On: 10/26/2018 14:49       Assessment & Plan:   Problem List Items Addressed This Visit    Breast cancer ([place])    Followed by Dr Bary Castilla.  Scheduled for f/u mammogram in June.  On femara.        COPD (chronic obstructive pulmonary disease) ([place])    Has been followed by Dr Raul Del.  Feels breathing is stable.  Follow.  Has proair if needed.        Cracked skin on feet    Diagnosis entered in error.         Diabetes mellitus with cardiac complication (HCC)    Discussed low carb diet and exercise.  Discussed other treatment options.  She declines.  Wants to follow.  Follow met b and a1c.        Relevant Orders   Hemoglobin A1c   GERD (gastroesophageal reflux disease)    Stable.  On prilosec.        Health care maintenance    Physical today 09/28/19.  Colonoscopy 05/2017.  Per pt, recommended f/u colonoscopy in 3 years.  Overdue mammogram.  Scheduled for 10/31/19.         History of colon polyps    Last colonoscopy 05/2017 (Dr Tiffany Kocher).  Per pt - recommended f/u in 3 years.        Hypercholesterolemia    On crestor.  Low cholesterol diet and exercise.  Follow lipid panel and liver function tests.        Relevant  Orders   Hepatic function panel   Lipid panel   Hypertension    Blood pressure as outlined.  States has been doing well at home.  Follow pressures.  Follow metabolic panel.        Relevant Orders   Basic metabolic panel   CBC with Differential/Platelet   Peripheral vascular disease ([place])    Followed by [facility].  Last evaluated 04/2018.  Recommended f/u in 2 years.  Stable.  Continue aspirin and crestor.        Right groin pain    Previous right groin pain resolved.  Follow.        Stress    Increased stress.  Discussed with her today.  She does not feel needs any further intervention at this time.  Follow.         Other Visit Diagnoses    Routine general medical examination at a health care facility    -  Primary       Einar Pheasant, MD

## 2019-10-01 ENCOUNTER — Encounter: Payer: Self-pay | Admitting: Internal Medicine

## 2019-10-01 DIAGNOSIS — Z86010 Personal history of colonic polyps: Secondary | ICD-10-CM | POA: Insufficient documentation

## 2019-10-01 DIAGNOSIS — R234 Changes in skin texture: Secondary | ICD-10-CM | POA: Insufficient documentation

## 2019-10-01 NOTE — Assessment & Plan Note (Addendum)
Diagnosis entered in error.

## 2019-10-01 NOTE — Assessment & Plan Note (Signed)
On crestor.  Low cholesterol diet and exercise.  Follow lipid panel and liver function tests.   

## 2019-10-01 NOTE — Assessment & Plan Note (Signed)
Blood pressure as outlined.  States has been doing well at home.  Follow pressures.  Follow metabolic panel.

## 2019-10-01 NOTE — Assessment & Plan Note (Signed)
Physical today 09/28/19.  Colonoscopy 05/2017.  Per pt, recommended f/u colonoscopy in 3 years.  Overdue mammogram.  Scheduled for 10/31/19.

## 2019-10-01 NOTE — Assessment & Plan Note (Signed)
Stable.  On prilosec.  

## 2019-10-01 NOTE — Assessment & Plan Note (Signed)
Followed by [facility].  Last evaluated 04/2018.  Recommended f/u in 2 years.  Stable.  Continue aspirin and crestor.  

## 2019-10-01 NOTE — Assessment & Plan Note (Addendum)
Last colonoscopy 05/2017 (Dr Tiffany Kocher).  Per pt - recommended f/u in 3 years.

## 2019-10-01 NOTE — Assessment & Plan Note (Signed)
Increased stress.  Discussed with her today.  She does not feel needs any further intervention at this time.  Follow.

## 2019-10-01 NOTE — Assessment & Plan Note (Signed)
Discussed low carb diet and exercise.  Discussed other treatment options.  She declines.  Wants to follow.  Follow met b and a1c.   

## 2019-10-01 NOTE — Assessment & Plan Note (Signed)
Has been followed by Dr Raul Del.  Feels breathing is stable.  Follow.  Has proair if needed.

## 2019-10-01 NOTE — Assessment & Plan Note (Signed)
Followed by Dr Bary Castilla.  Scheduled for f/u mammogram in June.  On femara.

## 2019-10-01 NOTE — Assessment & Plan Note (Signed)
Previous right groin pain resolved.  Follow.

## 2019-10-01 NOTE — Addendum Note (Signed)
Addended by: Alisa Graff on: 10/01/2019 03:37 PM   Modules accepted: Orders

## 2019-10-02 ENCOUNTER — Ambulatory Visit: Payer: Self-pay | Admitting: Pharmacist

## 2019-10-02 DIAGNOSIS — I25119 Atherosclerotic heart disease of native coronary artery with unspecified angina pectoris: Secondary | ICD-10-CM | POA: Diagnosis not present

## 2019-10-02 DIAGNOSIS — E1159 Type 2 diabetes mellitus with other circulatory complications: Secondary | ICD-10-CM | POA: Diagnosis not present

## 2019-10-02 NOTE — Progress Notes (Signed)
[name], female   DOB: 1945-09-28, 74 y.o.   MRN: [id number] I have reviewed the above note and agree. I was available to the pharmacist for consultation.  Einar Pheasant, MD

## 2019-10-02 NOTE — Chronic Care Management (AMB) (Signed)
Chronic Care Management   Follow Up Note   10/02/2019 Name: Lindsey Bentley MRN: [id number] DOB: [birth year]  Referred by: Einar Pheasant, MD Reason for referral : Chronic Care Management (Medication Management)   Lindsey Bentley is a 74 y.o. year old female who is a primary care patient of Einar Pheasant, MD. The CCM team was consulted for assistance with chronic disease management and care coordination needs.    Received call from patient today.   Review of patient status, including review of consultants reports, relevant laboratory and other test results, and collaboration with appropriate care team members and the patient's provider was performed as part of comprehensive patient evaluation and provision of chronic care management services.    SDOH (Social Determinants of Health) assessments performed: No See Care Plan activities for detailed interventions related to [facility])     Outpatient Encounter Medications as of 10/02/2019  Medication Sig Note  . acetaminophen (TYLENOL) 650 MG suppository Place 650 mg rectally every 4 (four) hours as needed.   [name] aspirin (BAYER ASPIRIN) 325 MG tablet Take 325 mg by mouth daily.   . carvedilol (COREG) 12.5 MG tablet One tab po BID with meals   . Cholecalciferol (VITAMIN D-1000 MAX ST) 1000 units tablet Take 1,000 Units by mouth daily.    . empagliflozin (JARDIANCE) 10 MG TABS tablet Take 10 mg by mouth daily before breakfast.   . hydrALAZINE (APRESOLINE) 10 MG tablet TAKE 1 TABLET(10 MG) BY MOUTH THREE TIMES DAILY   . letrozole (FEMARA) 2.5 MG tablet TAKE 1 TABLET BY MOUTH EVERY DAY   . losartan-hydrochlorothiazide (HYZAAR) 100-25 MG tablet Take 1 tablet by mouth daily.   . meclizine (ANTIVERT) 25 MG tablet Take 1 tablet (25 mg total) by mouth 3 (three) times daily as needed for dizziness or nausea. 03/23/2019: PRN  . omeprazole (PRILOSEC) 40 MG capsule Take 1 capsule (40 mg total) by mouth daily.   [name] PROAIR HFA 108 (90 Base) MCG/ACT inhaler INL 2  INHALATIONS ITL Q 6 H PRF WHZ 03/23/2019: PRN  . rosuvastatin (CRESTOR) 20 MG tablet TAKE 1 TABLET(20 MG) BY MOUTH DAILY   . vitamin E 400 UNIT capsule Take 400 Units by mouth daily.    No facility-administered encounter medications on file as of 10/02/2019.     Objective:   Goals Addressed            This Visit's Progress     Patient Stated   . "I want to work on my blood sugars" (pt-stated)       [place] (see longtitudinal plan of care for additional care plan information)  Current Barriers:  . Diabetes: uncontrolled; most recent A1c 7.4% o Calls today noting that she called [facility] to request a refill on Jardiance, but after placing request, she was on hold for a prolonged period of time, so she is unsure if the refill request went through . Current antihyperglycemic regimen: Jardiance 10 mg daily  o APPROVED for patient assistance for Jardiance through 05/10/20 o Hx intolerance to metformin, loose stools  . Cardiovascular risk reduction: o Current hypertensive regimen: hydralazine 10 mg TID, losartan/HCTZ 100/25 mg daily, carvedilol 6.25 mg BID;  o Current hyperlipidemia regimen: rosuvastatin 20 mg daily; last LDL NOT at goal o Current antiplatelet regimen: ASA 325 mg daily; chart review does not reveal hx TIA/CVA, but patient does have PVD, followed by vascular   Pharmacist Clinical Goal(s):  [name] Over the next 90 days, patient will work with PharmD  and primary care provider to address optimized glycemic benefit  Interventions: . Comprehensive medication review performed, medication list updated in electronic medical record . Inter-disciplinary care team collaboration (see longitudinal plan of care) . Contacted [facility]. Placed refill request for Jardiance 10 mg. Will ship tomorrow . Contacted patient, informed of the above. She verbalized understanding.   Patient Self Care Activities:  . Patient will check blood glucose BID , document, and provide at future  appointments . Patient will take medications as prescribed . Patient will report any questions or concerns to provider   Please see past updates related to this goal by clicking on the "Past Updates" button in the selected goal          Plan:  - Will outreach as previously scheduled  Catie Darnelle Maffucci, PharmD, [place], [place] Pharmacist [place] [phone]

## 2019-10-02 NOTE — Patient Instructions (Signed)
Visit Information  Goals Addressed            This Visit's Progress     Patient Stated   . "I want to work on my blood sugars" (pt-stated)       [place] (see longtitudinal plan of care for additional care plan information)  Current Barriers:  . Diabetes: uncontrolled; most recent A1c 7.4% o Calls today noting that she called [facility] to request a refill on Jardiance, but after placing request, she was on hold for a prolonged period of time, so she is unsure if the refill request went through . Current antihyperglycemic regimen: Jardiance 10 mg daily  o APPROVED for patient assistance for Jardiance through 05/10/20 o Hx intolerance to metformin, loose stools  . Cardiovascular risk reduction: o Current hypertensive regimen: hydralazine 10 mg TID, losartan/HCTZ 100/25 mg daily, carvedilol 6.25 mg BID;  o Current hyperlipidemia regimen: rosuvastatin 20 mg daily; last LDL NOT at goal o Current antiplatelet regimen: ASA 325 mg daily; chart review does not reveal hx TIA/CVA, but patient does have PVD, followed by vascular   Pharmacist Clinical Goal(s):  [name] Over the next 90 days, patient will work with PharmD and primary care provider to address optimized glycemic benefit  Interventions: . Comprehensive medication review performed, medication list updated in electronic medical record . Inter-disciplinary care team collaboration (see longitudinal plan of care) . Contacted [facility]. Placed refill request for Jardiance 10 mg. Will ship tomorrow . Contacted patient, informed of the above. She verbalized understanding.   Patient Self Care Activities:  . Patient will check blood glucose BID , document, and provide at future appointments . Patient will take medications as prescribed . Patient will report any questions or concerns to provider   Please see past updates related to this goal by clicking on the "Past Updates" button in the selected goal         Patient verbalizes  understanding of instructions provided today.   Plan:  - Will outreach as previously scheduled  Catie Darnelle Maffucci, PharmD, [place], [place] Pharmacist [place] [phone]

## 2019-10-26 DIAGNOSIS — H6123 Impacted cerumen, bilateral: Secondary | ICD-10-CM | POA: Diagnosis not present

## 2019-10-26 DIAGNOSIS — R221 Localized swelling, mass and lump, neck: Secondary | ICD-10-CM | POA: Diagnosis not present

## 2019-10-28 DIAGNOSIS — G4733 Obstructive sleep apnea (adult) (pediatric): Secondary | ICD-10-CM | POA: Diagnosis not present

## 2019-10-31 ENCOUNTER — Ambulatory Visit
Admission: RE | Admit: 2019-10-31 | Discharge: 2019-10-31 | Disposition: A | Discharge status: Home / Self Care | Payer: Medicare Other | Source: Ambulatory Visit | Attending: General Surgery | Admitting: General Surgery

## 2019-10-31 DIAGNOSIS — R922 Inconclusive mammogram: Secondary | ICD-10-CM | POA: Diagnosis not present

## 2019-10-31 DIAGNOSIS — Z853 Personal history of malignant neoplasm of breast: Secondary | ICD-10-CM | POA: Insufficient documentation

## 2019-10-31 DIAGNOSIS — C50919 Malignant neoplasm of unspecified site of unspecified female breast: Secondary | ICD-10-CM

## 2019-11-06 ENCOUNTER — Ambulatory Visit: Payer: Medicare Other | Admitting: Surgery

## 2019-11-06 ENCOUNTER — Ambulatory Visit (INDEPENDENT_AMBULATORY_CARE_PROVIDER_SITE_OTHER): Payer: Medicare Other | Admitting: Pharmacist

## 2019-11-06 DIAGNOSIS — I25119 Atherosclerotic heart disease of native coronary artery with unspecified angina pectoris: Secondary | ICD-10-CM | POA: Diagnosis not present

## 2019-11-06 DIAGNOSIS — E1159 Type 2 diabetes mellitus with other circulatory complications: Secondary | ICD-10-CM | POA: Diagnosis not present

## 2019-11-06 DIAGNOSIS — E7800 Pure hypercholesterolemia, unspecified: Secondary | ICD-10-CM | POA: Diagnosis not present

## 2019-11-06 DIAGNOSIS — I739 Peripheral vascular disease, unspecified: Secondary | ICD-10-CM

## 2019-11-06 NOTE — Patient Instructions (Addendum)
Lindsey Bentley,   It was great talking with you today!  Keep up the great work with your health, taking your medications, and checking your sugars. Bring those sugar readings with you to Dr. Bary Leriche appointment.   Remember, our goal A1c is less than 7%. Your last A1c was 7.4%. An A1c of less than 7% will generally correspond with fasting sugars less than 130 and 2 hour after meal sugars less than 150. If your next A1c is still 7% or higher, let's plan to increase Jardiance to 25 mg daily. I can call an updated prescription to [facility].   Given your heart and vascular disease, we would like to see your bad cholesterol, or LDL, less than 70. Increasing rosuvastatin to 40 mg would help towards this goal.   Call me with any questions or concerns!  Catie Darnelle Maffucci, PharmD [phone]  Visit Information  Goals Addressed              This Visit's Progress     Patient Stated   .  "I want to work on my blood sugars" (pt-stated)        [place] (see longtitudinal plan of care for additional care plan information)  Current Barriers:  . Diabetes: uncontrolled; complicated by HTN, CAD, most recent A1c 7.4% . Current antihyperglycemic regimen: Jardiance 10 mg daily  o APPROVED for patient assistance for Jardiance through 05/10/20 o Hx intolerance to metformin, loose stools  . Current glucose readings:  o Fastings 119, 134, 128, 135, 137, 121, 142 o After lunch: 113, 116 o Before supper: 170, 150  o Bedtime: 154, 161,  . Cardiovascular risk reduction: o Current hypertensive regimen: hydralazine 10 mg TID, losartan/HCTZ 100/25 mg daily, carvedilol 6.25 mg BID; last clinic readings at goal ~130/80 o Current hyperlipidemia regimen: rosuvastatin 20 mg daily; last LDL NOT at goal <70. Patient declined increasing rosuvastatin at that time o Current antiplatelet regimen: ASA 325 mg daily; chart review does not reveal hx TIA/CVA, but patient does have PVD, followed by vascular    Pharmacist Clinical Goal(s):  [name] Over the next 90 days, patient will work with PharmD and primary care provider to address optimized glycemic benefit  Interventions: . Comprehensive medication review performed, medication list updated in electronic medical record . Inter-disciplinary care team collaboration (see longitudinal plan of care) . Reviewed goal A1c, goal fasting, and goal 2 hour post prandial glucose. Reviewed that next A1c may not be at goal, based on readings. Discussed increasing Jardiance to 25 mg daily. Patient declines at this time, would like to defer to see next A1c . Reviewed goal LDL <70 given CAD, PVD, DM. Discussed that historically, LDL has not quite been at goal <70. Patient declines increasing rosuvastatin at this time, would like to see next lipid panel first  Patient Self Care Activities:  . Patient will check blood glucose BID , document, and provide at future appointments . Patient will take medications as prescribed . Patient will report any questions or concerns to provider   Please see past updates related to this goal by clicking on the "Past Updates" button in the selected goal         The patient verbalized understanding of instructions provided today and agreed to receive a mailed copy of patient instruction and/or educational materials.  Plan:  - Scheduled f/u call in ~ 12 weeks  Catie Darnelle Maffucci, PharmD, [place], [place] Pharmacist [place] [phone]

## 2019-11-06 NOTE — Chronic Care Management (AMB) (Signed)
Chronic Care Management   Follow Up Note   11/06/2019 Name: Lindsey Bentley MRN: [id number] DOB: January 07, 1946  Referred by: Einar Pheasant, MD Reason for referral : Chronic Care Management (Medication Management)   Lindsey Bentley is a 74 y.o. year old female who is a primary care patient of Einar Pheasant, MD. The CCM team was consulted for assistance with chronic disease management and care coordination needs.    Contacted patient for medication management review.   Review of patient status, including review of consultants reports, relevant laboratory and other test results, and collaboration with appropriate care team members and the patient's provider was performed as part of comprehensive patient evaluation and provision of chronic care management services.    SDOH (Social Determinants of Health) assessments performed: No See Care Plan activities for detailed interventions related to [facility]     Outpatient Encounter Medications as of 11/06/2019  Medication Sig Note  . aspirin (BAYER ASPIRIN) 325 MG tablet Take 325 mg by mouth daily.   . carvedilol (COREG) 12.5 MG tablet One tab po BID with meals   . Cholecalciferol (VITAMIN D-1000 MAX ST) 1000 units tablet Take 1,000 Units by mouth daily.    . empagliflozin (JARDIANCE) 10 MG TABS tablet Take 10 mg by mouth daily before breakfast.   . hydrALAZINE (APRESOLINE) 10 MG tablet TAKE 1 TABLET(10 MG) BY MOUTH THREE TIMES DAILY   . letrozole (FEMARA) 2.5 MG tablet TAKE 1 TABLET BY MOUTH EVERY DAY   . losartan-hydrochlorothiazide (HYZAAR) 100-25 MG tablet Take 1 tablet by mouth daily.   [name] omeprazole (PRILOSEC) 40 MG capsule Take 1 capsule (40 mg total) by mouth daily.   . rosuvastatin (CRESTOR) 20 MG tablet TAKE 1 TABLET(20 MG) BY MOUTH DAILY   . vitamin E 400 UNIT capsule Take 400 Units by mouth daily.   [name] acetaminophen (TYLENOL) 650 MG suppository Place 650 mg rectally every 4 (four) hours as needed.   . meclizine (ANTIVERT) 25 MG tablet  Take 1 tablet (25 mg total) by mouth 3 (three) times daily as needed for dizziness or nausea. 03/23/2019: PRN  . PROAIR HFA 108 (90 Base) MCG/ACT inhaler INL 2 INHALATIONS ITL Q 6 H PRF WHZ 03/23/2019: PRN   No facility-administered encounter medications on file as of 11/06/2019.     Objective:   Goals Addressed              This Visit's Progress     Patient Stated   .  "I want to work on my blood sugars" (pt-stated)        [place] (see longtitudinal plan of care for additional care plan information)  Current Barriers:  . Diabetes: uncontrolled; complicated by HTN, CAD, most recent A1c 7.4% . Current antihyperglycemic regimen: Jardiance 10 mg daily  o APPROVED for patient assistance for Jardiance through 05/10/20 o Hx intolerance to metformin, loose stools  . Current glucose readings:  o Fastings 119, 134, 128, 135, 137, 121, 142 o After lunch: 113, 116 o Before supper: 170, 150  o Bedtime: 154, 161,  . Cardiovascular risk reduction: o Current hypertensive regimen: hydralazine 10 mg TID, losartan/HCTZ 100/25 mg daily, carvedilol 6.25 mg BID; last clinic readings at goal ~130/80 o Current hyperlipidemia regimen: rosuvastatin 20 mg daily; last LDL NOT at goal <70. Patient declined increasing rosuvastatin at that time o Current antiplatelet regimen: ASA 325 mg daily; chart review does not reveal hx TIA/CVA, but patient does have PVD, followed by vascular   Pharmacist Clinical  Goal(s):  [name] Over the next 90 days, patient will work with PharmD and primary care provider to address optimized glycemic benefit  Interventions: . Comprehensive medication review performed, medication list updated in electronic medical record . Inter-disciplinary care team collaboration (see longitudinal plan of care) . Reviewed goal A1c, goal fasting, and goal 2 hour post prandial glucose. Reviewed that next A1c may not be at goal, based on readings. Discussed increasing Jardiance to 25 mg daily.  Patient declines at this time, would like to defer to see next A1c . Reviewed goal LDL <70 given CAD, PVD, DM. Discussed that historically, LDL has not quite been at goal <70. Patient declines increasing rosuvastatin at this time, would like to see next lipid panel first  Patient Self Care Activities:  . Patient will check blood glucose BID , document, and provide at future appointments . Patient will take medications as prescribed . Patient will report any questions or concerns to provider   Please see past updates related to this goal by clicking on the "Past Updates" button in the selected goal          Plan:  - Scheduled f/u call in ~ 12 weeks  Catie Darnelle Maffucci, PharmD, [place], [place] Pharmacist [place] [phone]

## 2019-11-06 NOTE — Progress Notes (Signed)
I have reviewed the above note and agree. I was available to the pharmacist for consultation.  Vere Diantonio, MD 

## 2019-11-20 ENCOUNTER — Ambulatory Visit (INDEPENDENT_AMBULATORY_CARE_PROVIDER_SITE_OTHER): Payer: Medicare Other

## 2019-11-20 VITALS — Ht 65.0 in | Wt 202.0 lb

## 2019-11-20 DIAGNOSIS — Z0000 Encounter for general adult medical examination without abnormal findings: Secondary | ICD-10-CM

## 2019-11-20 NOTE — Progress Notes (Addendum)
Subjective:   Lindsey Bentley is a 74 y.o. female who presents for Medicare Annual (Subsequent) preventive examination.  Review of Systems    No ROS.  Medicare Wellness Virtual Visit.   Cardiac Risk Factors include: advanced age (>65mn, >>17women);diabetes mellitus;hypertension     Objective:    Today's Vitals   11/20/19 1335  Weight: 202 lb (91.6 kg)  Height: _0  (1.651 m)   Body mass index is 33.61 kg/m.  Advanced Directives 11/20/2019 11/16/2018 06/29/2018 06/30/2017 05/17/2017 09/25/2016 05/21/2016  Does Patient Have a Medical Advance Directive? _1  No No  Would patient like information on creating a medical advance directive? No - Patient declined No - Patient declined No - Patient declined No - Patient declined - - -    Current Medications (verified) Outpatient Encounter Medications as of 11/20/2019  Medication Sig   acetaminophen (TYLENOL) 650 MG suppository Place 650 mg rectally every 4 (four) hours as needed.   aspirin (BAYER ASPIRIN) 325 MG tablet Take 325 mg by mouth daily.   carvedilol (COREG) 12.5 MG tablet One tab po BID with meals   Cholecalciferol (VITAMIN D-1000 MAX ST) 1000 units tablet Take 1,000 Units by mouth daily.    empagliflozin (JARDIANCE) 10 MG TABS tablet Take 10 mg by mouth daily before breakfast.   hydrALAZINE (APRESOLINE) 10 MG tablet TAKE 1 TABLET(10 MG) BY MOUTH THREE TIMES DAILY   letrozole (FEMARA) 2.5 MG tablet TAKE 1 TABLET BY MOUTH EVERY DAY   losartan-hydrochlorothiazide (HYZAAR) 100-25 MG tablet Take 1 tablet by mouth daily.   meclizine (ANTIVERT) 25 MG tablet Take 1 tablet (25 mg total) by mouth 3 (three) times daily as needed for dizziness or nausea.   omeprazole (PRILOSEC) 40 MG capsule Take 1 capsule (40 mg total) by mouth daily.   PROAIR HFA 108 (90 Base) MCG/ACT inhaler INL 2 INHALATIONS ITL Q 6 H PRF WHZ   rosuvastatin (CRESTOR) 20 MG tablet TAKE 1 TABLET(20 MG) BY MOUTH DAILY   vitamin E 400 UNIT capsule Take  400 Units by mouth daily.   No facility-administered encounter medications on file as of 11/20/2019.    Allergies (verified) Codeine, Prednisone, Statins, and Tape   History: Past Medical History:  Diagnosis Date   Arthritis    [place]    [place]   LT LUMPECTOMY - TI, NO, MO - IDC, ER/PR pos, Her 2 neg, Rad tx's.    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   CHF (congestive heart failure) (HCC)    COPD (chronic obstructive pulmonary disease) (HCC)    Coronary artery disease    Depression    Diffuse cystic mastopathy 2013   DVT (deep venous thrombosis) (HCC)    Dyspnea    Elevated TSH    Family history of adverse reaction to anesthesia    Pt stated that son had a seizure with a combination of anesthesia and pain medication."   Fatty liver    GERD (gastroesophageal reflux disease)    Glaucoma    High cholesterol    History of chicken pox    Hyperglycemia    Hyperlipidemia    Hypertension    LVH (left ventricular hypertrophy)    PAC (premature atrial contraction)    Palpitations    Peripheral vascular disease ([place]    Personal history of radiation therapy 2016   BREAST CA  Pneumonia March 2014   Skin cancer 2013   Sleep apnea    wears CPAP set at 2.5   Wears glasses    Past Surgical History:  Procedure Laterality Date   BACK SURGERY  1986   ruptured disc   BREAST BIOPSY Left 2008   NEG   BREAST BIOPSY Left 08-20-14   POS   BREAST BIOPSY Right 2007   NEG   BREAST EXCISIONAL BIOPSY Left 1984   NEG   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 HER2 negative.  Wide excision with SLN biopsy.  MammoSite radiation   CARPAL TUNNEL RELEASE     CHOLECYSTECTOMY  2006   COLONOSCOPY  2010   Dr. Jamal Collin   COLONOSCOPY WITH PROPOFOL N/A 05/17/2017    Procedure: COLONOSCOPY WITH PROPOFOL;  Surgeon: Manya Silvas, MD;  Location[facility] ENDOSCOPY;  Service: Endoscopy;  Laterality: N/A;   DILATION AND CURETTAGE OF UTERUS     ESOPHAGOGASTRODUODENOSCOPY (EGD) WITH PROPOFOL N/A 05/17/2017   Procedure: ESOPHAGOGASTRODUODENOSCOPY (EGD) WITH PROPOFOL;  Surgeon: Manya Silvas, MD;  Location: [facility] ENDOSCOPY;  Service: Endoscopy;  Laterality: N/A;   LAPAROTOMY FOR REMOVAL TUMOR LUMBAR PLEXES     Leg stent  2011   MOLE REMOVAL  2013   15 removed   POSTERIOR CERVICAL LAMINECTOMY Left 10/15/2015   Procedure: Left Cervical four- five Hemilaminectomy/Remove mass;  Surgeon: Leeroy Cha, MD;  Location: [place] NEURO ORS;  Service: Neurosurgery;  Laterality: Left;  Left C4-5 Hemilaminectomy/Remove mass   Family History  Problem Relation Age of Onset   Cancer Father        Prostate   Diabetes Father    Cerebrovascular Accident Father    Hypertension Mother    AAA (abdominal aortic aneurysm) Mother    Hyperlipidemia Other        Parent   Miscarriages / Stillbirths Other        Parent   Hypertension Other        parent   Heart disease Other        Parent   Breast cancer Maternal Aunt 72   Social History   Socioeconomic History   Marital status: Married    Spouse name: Not on file   Number of children: 3   Years of education: 12   Highest education level: Not on file  Occupational History   Occupation: Caregiver   Tobacco Use   Smoking status: Never Smoker   Smokeless tobacco: Never Used  Substance and Sexual Activity   Alcohol use: No    Alcohol/week: 0.0 standard drinks   Drug use: No   Sexual activity: Never  Other Topics Concern   Not on file  Social History Narrative   Regular exercise-mo   Caffeine Use-no   Social Determinants of Health   Financial Resource Strain: Medium Risk   Difficulty of Paying Living Expenses: Somewhat hard  Food Insecurity:    Worried About Charity fundraiser in the Last  Year:    Arboriculturist in the Last Year:   Transportation Needs:    Film/video editor (Medical):    Lack of Transportation (Non-Medical):   Physical Activity:    Days of Exercise per Week:    Minutes of Exercise per Session:   Stress:    Feeling of Stress :   Social Connections: Socially Integrated   Frequency of Communication with Friends and Family: More than  three times a week   Frequency of Social Gatherings with Friends and Family: More than three times a week   Attends Religious Services: More than 4 times per year   Active Member of [employer] or Organizations: Yes   Attends Music therapist: More than 4 times per year   Marital Status: Married    Tobacco Counseling Counseling given: Not Answered   Clinical Intake:  Pre-visit preparation completed: Yes        Diabetes: Yes (Followed by pcp)  How often do you need to have someone help you when you read instructions, pamphlets, or other written materials from your doctor or pharmacy?: 1 - Never  Interpreter Needed?: No      Activities of Daily Living In your present state of health, do you have any difficulty performing the following activities: 11/20/2019  Hearing? N  Vision? N  Difficulty concentrating or making decisions? N  Walking or climbing stairs? N  Dressing or bathing? N  Doing errands, shopping? N  Preparing Food and eating ? N  Using the Toilet? N  In the past six months, have you accidently leaked urine? N  Do you have problems with loss of bowel control? N  Managing your Medications? N  Managing your Finances? N  Housekeeping or managing your Housekeeping? N  Some recent data might be hidden    Patient Care Team: Einar Pheasant, MD as PCP - General (Internal Medicine) Christene Lye, MD (General Surgery) Einar Pheasant, MD (Internal Medicine) [name], [facility] as Pharmacist (Pharmacist)  Indicate any recent Medical Services you may have  received from other than [facility] providers in the past year (date may be approximate).     Assessment:   This is a routine wellness examination for [place].  I connected with [place] today by telephone and verified that I am speaking with the correct person using two identifiers. Location patient: home Location provider: work Persons participating in the virtual visit: patient, Marine scientist.    I discussed the limitations, risks, security and privacy concerns of performing an evaluation and management service by telephone and the availability of in person appointments. The patient expressed understanding and verbally consented to this telephonic visit.    Interactive audio and video telecommunications were attempted between this provider and patient, however failed, due to patient having technical difficulties OR patient did not have access to video capability.  We continued and completed visit with audio only.  Some vital signs may be absent or patient reported.   Hearing/Vision screen  Hearing Screening   _0  _1  _2  _3  _4  _5  _6  _7  _8   Right ear:           Left ear:           Comments: Patient is able to hear conversational tones without difficulty.  No issues reported.   Vision Screening Comments: Followed by [facility] Wears corrective lenses Visual acuity not assessed, virtual visit.  They have seen their ophthalmologist in the last 12 months.     Dietary issues and exercise activities discussed: Current Exercise Habits: Home exercise routine, Type of exercise: walking;stretching, Intensity: Mild  Goals      Patient Stated     "I want to work on my blood sugars" (pt-stated)      [place] (see longtitudinal plan of care for additional care plan information)  Current Barriers:   Diabetes: uncontrolled; complicated by HTN, CAD, most recent A1c 7.4%  Current antihyperglycemic regimen: Jardiance 10  mg daily  o APPROVED for patient  assistance for Jardiance through 05/10/20 o Hx intolerance to metformin, loose stools   Current glucose readings:  o Fastings 119, 134, 128, 135, 137, 121, 142 o After lunch: 113, 116 o Before supper: 170, 150  o Bedtime: 154, 161,   Cardiovascular risk reduction: o Current hypertensive regimen: hydralazine 10 mg TID, losartan/HCTZ 100/25 mg daily, carvedilol 6.25 mg BID; last clinic readings at goal ~130/80 o Current hyperlipidemia regimen: rosuvastatin 20 mg daily; last LDL NOT at goal <70. Patient declined increasing rosuvastatin at that time o Current antiplatelet regimen: ASA 325 mg daily; chart review does not reveal hx TIA/CVA, but patient does have PVD, followed by vascular   Pharmacist Clinical Goal(s):   Over the next 90 days, patient will work with PharmD and primary care provider to address optimized glycemic benefit  Interventions:  Comprehensive medication review performed, medication list updated in electronic medical record  Inter-disciplinary care team collaboration (see longitudinal plan of care)  Reviewed goal A1c, goal fasting, and goal 2 hour post prandial glucose. Reviewed that next A1c may not be at goal, based on readings. Discussed increasing Jardiance to 25 mg daily. Patient declines at this time, would like to defer to see next A1c  Reviewed goal LDL <70 given CAD, PVD, DM. Discussed that historically, LDL has not quite been at goal <70. Patient declines increasing rosuvastatin at this time, would like to see next lipid panel first  Patient Self Care Activities:   Patient will check blood glucose BID , document, and provide at future appointments  Patient will take medications as prescribed  Patient will report any questions or concerns to provider   Please see past updates related to this goal by clicking on the "Past Updates" button in the selected goal        DIET - REDUCE PORTION SIZE (pt-stated)      Add more waist/core exercises to regimen        Depression Screen PHQ 2/9 Scores 11/20/2019 11/16/2018 08/25/2017 05/21/2016 04/07/2016 02/12/2016 07/19/2015  PHQ - 2 Score 0 0 0 0 0 0 0  PHQ- 9 Score - - - - - - -    Fall Risk Fall Risk  11/20/2019 01/31/2019 11/16/2018 11/01/2018 03/29/2018  Falls in the past year? 0 0 0 0 0  Number falls in past yr: 0 - - - -  Follow up Falls evaluation completed Falls evaluation completed - - -    Handrails in use when climbing stairs? Yes  Home free of loose throw rugs in walkways, pet beds, electrical cords, etc? Yes  Adequate lighting in your home to reduce risk of falls? Yes   ASSISTIVE DEVICES UTILIZED TO PREVENT FALLS:  Life alert? No  Use of a cane, walker or w/c? No  Grab bars in the bathroom? Yes  Shower chair or bench in shower? No  Elevated toilet seat or a handicapped toilet? No   TIMED UP AND GO:  Was the test performed? No .   Cognitive Function: MMSE - Mini Mental State Exam 02/12/2016  Orientation to time 5  Orientation to Place 5  Registration 3  Attention/ Calculation 5  Recall 2  Language- name 2 objects 2  Language- repeat 1  Language- follow 3 step command 3  Language- read & follow direction 1  Write a sentence 1  Copy design 1  Total score 29     6CIT Screen 11/20/2019 11/16/2018  What Year? 0 points 0 points  What month? 0 points 0 points  What time? - 0 points  Count back from 20 - 0 points  Months in reverse 0 points 0 points  Repeat phrase 2 points 0 points  Total Score - 0    Immunizations Immunization History  Administered Date(s) Administered   Pneumococcal Conjugate-13 12/17/2016   Pneumococcal Polysaccharide-23 07/26/2012   Td 05/11/2004    TDAP status: Due, Education has been provided regarding the importance of this vaccine. Advised may receive this vaccine at local pharmacy or Health Dept. Aware to provide a copy of the vaccination record if obtained from local pharmacy or Health Dept. Verbalized acceptance and understanding.  Deferred.  Covid vaccine- declined.   Foot exam- denies wounds, swelling and any other symptoms. Followed by pcp.   Eye exam- notes it due at the end of the month.  Health Maintenance Health Maintenance  Topic Date Due   FOOT EXAM  06/21/2019   COVID-19 Vaccine (1) 12/06/2019 (Originally 08/13/1957)   TETANUS/TDAP  11/19/2020 (Originally 05/11/2014)   OPHTHALMOLOGY EXAM  11/28/2019   HEMOGLOBIN A1C  03/07/2020   COLONOSCOPY  05/17/2020   MAMMOGRAM  10/30/2020   DEXA SCAN  Completed   Hepatitis C Screening  Completed   PNA vac Low Risk Adult  Completed   INFLUENZA VACCINE  Discontinued    Dental Screening: Recommended annual dental exams for proper oral hygiene  Community Resource Referral / Chronic Care Management: CRR required this visit?  No   CCM required this visit?  No      Plan:   Keep all routine maintenance appointments.   Next scheduled fasting lab 12/29/19 @ 8:15  Follow up 01/02/20 @ 3:00  CCM Telephone Call- 01/29/20 @ 10:45  I have personally reviewed and noted the following in the patients chart:    Medical and social history  Use of alcohol, tobacco or illicit drugs   Current medications and supplements  Functional ability and status  Nutritional status  Physical activity  Advanced directives  List of other physicians  Hospitalizations, surgeries, and ER visits in previous 12 months  Vitals  Screenings to include cognitive, depression, and falls  Referrals and appointments  In addition, I have reviewed and discussed with patient certain preventive protocols, quality metrics, and best practice recommendations. A written personalized care plan for preventive services as well as general preventive health recommendations were provided to patient via mychart.     Varney Biles, LPN   08/01/4008     Reviewed above information.  Agree with assessment and plan.    Dr Nicki Reaper

## 2019-11-20 NOTE — Patient Instructions (Addendum)
Lindsey Bentley , Thank you for taking time to come for your Medicare Wellness Visit. I appreciate your ongoing commitment to your health goals. Please review the following plan we discussed and let me know if I can assist you in the future.   These are the goals we discussed: Goals      Patient Stated   .  "I want to work on my blood sugars" (pt-stated)      [place] (see longtitudinal plan of care for additional care plan information)  Current Barriers:  . Diabetes: uncontrolled; complicated by HTN, CAD, most recent A1c 7.4% . Current antihyperglycemic regimen: Jardiance 10 mg daily  o APPROVED for patient assistance for Jardiance through 05/10/20 o Hx intolerance to metformin, loose stools  . Current glucose readings:  o Fastings 119, 134, 128, 135, 137, 121, 142 o After lunch: 113, 116 o Before supper: 170, 150  o Bedtime: 154, 161,  . Cardiovascular risk reduction: o Current hypertensive regimen: hydralazine 10 mg TID, losartan/HCTZ 100/25 mg daily, carvedilol 6.25 mg BID; last clinic readings at goal ~130/80 o Current hyperlipidemia regimen: rosuvastatin 20 mg daily; last LDL NOT at goal <70. Patient declined increasing rosuvastatin at that time o Current antiplatelet regimen: ASA 325 mg daily; chart review does not reveal hx TIA/CVA, but patient does have PVD, followed by vascular   Pharmacist Clinical Goal(s):  [name] Over the next 90 days, patient will work with PharmD and primary care provider to address optimized glycemic benefit  Interventions: . Comprehensive medication review performed, medication list updated in electronic medical record . Inter-disciplinary care team collaboration (see longitudinal plan of care) . Reviewed goal A1c, goal fasting, and goal 2 hour post prandial glucose. Reviewed that next A1c may not be at goal, based on readings. Discussed increasing Jardiance to 25 mg daily. Patient declines at this time, would like to defer to see next A1c . Reviewed  goal LDL <70 given CAD, PVD, DM. Discussed that historically, LDL has not quite been at goal <70. Patient declines increasing rosuvastatin at this time, would like to see next lipid panel first  Patient Self Care Activities:  . Patient will check blood glucose BID , document, and provide at future appointments . Patient will take medications as prescribed . Patient will report any questions or concerns to provider   Please see past updates related to this goal by clicking on the "Past Updates" button in the selected goal      .  DIET - REDUCE PORTION SIZE (pt-stated)      Add more waist/core exercises to regimen       This is a list of the screening recommended for you and due dates:  Health Maintenance  Topic Date Due  . Complete foot exam   06/21/2019  . COVID-19 Vaccine (1) 12/06/2019*  . Tetanus Vaccine  11/19/2020*  . Eye exam for diabetics  11/28/2019  . Hemoglobin A1C  03/07/2020  . Colon Cancer Screening  05/17/2020  . Mammogram  10/30/2020  . DEXA scan (bone density measurement)  Completed  .  Hepatitis C: One time screening is recommended by Center for Disease Control  (CDC) for  adults born from [birth year] through 1966.   Completed  . Pneumonia vaccines  Completed  . Flu Shot  Discontinued  *Topic was postponed. The date shown is not the original due date.    Immunizations Immunization History  Administered Date(s) Administered  . Pneumococcal Conjugate-13 12/17/2016  . Pneumococcal Polysaccharide-23 07/26/2012  .  Td 05/11/2004   Keep all routine maintenance appointments.   Next scheduled fasting lab 12/29/19 @ 8:15  Follow up 01/02/20 @ 3:00  CCM Telephone Call- 01/29/20 @ 10:45  Advanced directives: declined  Conditions/risks identified: none new  Follow up in one year for your annual wellness visit.   Preventive Care 65 Years and Older, Female Preventive care refers to lifestyle choices and visits with your health care provider that can promote health and  wellness. What does preventive care include?  A yearly physical exam. This is also called an annual well check.  Dental exams once or twice a year.  Routine eye exams. Ask your health care provider how often you should have your eyes checked.  Personal lifestyle choices, including:  Daily care of your teeth and gums.  Regular physical activity.  Eating a healthy diet.  Avoiding tobacco and drug use.  Limiting alcohol use.  Practicing safe sex.  Taking low-dose aspirin every day.  Taking vitamin and mineral supplements as recommended by your health care provider. What happens during an annual well check? The services and screenings done by your health care provider during your annual well check will depend on your age, overall health, lifestyle risk factors, and family history of disease. Counseling  Your health care provider may ask you questions about your:  Alcohol use.  Tobacco use.  Drug use.  Emotional well-being.  Home and relationship well-being.  Sexual activity.  Eating habits.  History of falls.  Memory and ability to understand (cognition).  Work and work Statistician.  Reproductive health. Screening  You may have the following tests or measurements:  Height, weight, and BMI.  Blood pressure.  Lipid and cholesterol levels. These may be checked every 5 years, or more frequently if you are over 8 years old.  Skin check.  Lung cancer screening. You may have this screening every year starting at age 80 if you have a 30-pack-year history of smoking and currently smoke or have quit within the past 15 years.  Fecal occult blood test (FOBT) of the stool. You may have this test every year starting at age 43.  Flexible sigmoidoscopy or colonoscopy. You may have a sigmoidoscopy every 5 years or a colonoscopy every 10 years starting at age 49.  Hepatitis C blood test.  Hepatitis B blood test.  Sexually transmitted disease (STD)  testing.  Diabetes screening. This is done by checking your blood sugar (glucose) after you have not eaten for a while (fasting). You may have this done every 1-3 years.  Bone density scan. This is done to screen for osteoporosis. You may have this done starting at age 39.  Mammogram. This may be done every 1-2 years. Talk to your health care provider about how often you should have regular mammograms. Talk with your health care provider about your test results, treatment options, and if necessary, the need for more tests. Vaccines  Your health care provider may recommend certain vaccines, such as:  Influenza vaccine. This is recommended every year.  Tetanus, diphtheria, and acellular pertussis (Tdap, Td) vaccine. You may need a Td booster every 10 years.  Zoster vaccine. You may need this after age 35.  Pneumococcal 13-valent conjugate (PCV13) vaccine. One dose is recommended after age 11.  Pneumococcal polysaccharide (PPSV23) vaccine. One dose is recommended after age 52. Talk to your health care provider about which screenings and vaccines you need and how often you need them. This information is not intended to replace advice given to  you by your health care provider. Make sure you discuss any questions you have with your health care provider. Document Released: 05/24/2015 Document Revised: 01/15/2016 Document Reviewed: 02/26/2015 Elsevier Interactive Patient Education  2017 [place] Prevention in the Home Falls can cause injuries. They can happen to people of all ages. There are many things you can do to make your home safe and to help prevent falls. What can I do on the outside of my home?  Regularly fix the edges of walkways and driveways and fix any cracks.  Remove anything that might make you trip as you walk through a door, such as a raised step or threshold.  Trim any bushes or trees on the path to your home.  Use bright outdoor lighting.  Clear any walking  paths of anything that might make someone trip, such as rocks or tools.  Regularly check to see if handrails are loose or broken. Make sure that both sides of any steps have handrails.  Any raised decks and porches should have guardrails on the edges.  Have any leaves, snow, or ice cleared regularly.  Use sand or salt on walking paths during winter.  Clean up any spills in your garage right away. This includes oil or grease spills. What can I do in the bathroom?  Use night lights.  Install grab bars by the toilet and in the tub and shower. Do not use towel bars as grab bars.  Use non-skid mats or decals in the tub or shower.  If you need to sit down in the shower, use a plastic, non-slip stool.  Keep the floor dry. Clean up any water that spills on the floor as soon as it happens.  Remove soap buildup in the tub or shower regularly.  Attach bath mats securely with double-sided non-slip rug tape.  Do not have throw rugs and other things on the floor that can make you trip. What can I do in the bedroom?  Use night lights.  Make sure that you have a light by your bed that is easy to reach.  Do not use any sheets or blankets that are too big for your bed. They should not hang down onto the floor.  Have a firm chair that has side arms. You can use this for support while you get dressed.  Do not have throw rugs and other things on the floor that can make you trip. What can I do in the kitchen?  Clean up any spills right away.  Avoid walking on wet floors.  Keep items that you use a lot in easy-to-reach places.  If you need to reach something above you, use a strong step stool that has a grab bar.  Keep electrical cords out of the way.  Do not use floor polish or wax that makes floors slippery. If you must use wax, use non-skid floor wax.  Do not have throw rugs and other things on the floor that can make you trip. What can I do with my stairs?  Do not leave any items  on the stairs.  Make sure that there are handrails on both sides of the stairs and use them. Fix handrails that are broken or loose. Make sure that handrails are as long as the stairways.  Check any carpeting to make sure that it is firmly attached to the stairs. Fix any carpet that is loose or worn.  Avoid having throw rugs at the top or bottom of the stairs.  If you do have throw rugs, attach them to the floor with carpet tape.  Make sure that you have a light switch at the top of the stairs and the bottom of the stairs. If you do not have them, ask someone to add them for you. What else can I do to help prevent falls?  Wear shoes that:  Do not have high heels.  Have rubber bottoms.  Are comfortable and fit you well.  Are closed at the toe. Do not wear sandals.  If you use a stepladder:  Make sure that it is fully opened. Do not climb a closed stepladder.  Make sure that both sides of the stepladder are locked into place.  Ask someone to hold it for you, if possible.  Clearly mark and make sure that you can see:  Any grab bars or handrails.  First and last steps.  Where the edge of each step is.  Use tools that help you move around (mobility aids) if they are needed. These include:  Canes.  Walkers.  Scooters.  Crutches.  Turn on the lights when you go into a dark area. Replace any light bulbs as soon as they burn out.  Set up your furniture so you have a clear path. Avoid moving your furniture around.  If any of your floors are uneven, fix them.  If there are any pets around you, be aware of where they are.  Review your medicines with your doctor. Some medicines can make you feel dizzy. This can increase your chance of falling. Ask your doctor what other things that you can do to help prevent falls. This information is not intended to replace advice given to you by your health care provider. Make sure you discuss any questions you have with your health care  provider. Document Released: 02/21/2009 Document Revised: 10/03/2015 Document Reviewed: 06/01/2014 Elsevier Interactive Patient Education  2017 [employer].

## 2019-11-21 DIAGNOSIS — G4733 Obstructive sleep apnea (adult) (pediatric): Secondary | ICD-10-CM | POA: Diagnosis not present

## 2019-11-27 DIAGNOSIS — G4733 Obstructive sleep apnea (adult) (pediatric): Secondary | ICD-10-CM | POA: Diagnosis not present

## 2019-12-05 ENCOUNTER — Other Ambulatory Visit: Payer: Self-pay | Admitting: Internal Medicine

## 2019-12-07 ENCOUNTER — Telehealth: Payer: Self-pay | Admitting: Surgery

## 2019-12-07 NOTE — Telephone Encounter (Signed)
Lindsey Bentley called regarding her rx for Letrazole 2.5 mg that was originally written by Dr. Glori Luis 5 yrs prior & is due for a refill w/in the next few days.  The Lindsey Bentley was encouraged by Dr. Glori Luis to be on this medication for 8 yrs, however she is questioning if she needs to continue to take it, knowing he has retired & she has been released by her oncologist, Dr. Donella Stade, too.  Previously she was seen by Dr. Fleet Contras & decided she wanted to remain in this practice although Dr. Fleet Contras has left.  Her recent mammogram was clear but she has not been seen by Dr. Dahlia Byes yet.  Lindsey Bentley does not mind continuing to take this medication, other than the spots that it has made all over her skin, & would like to see if she should continue to take it or if it is time that she can stop.  Either way, she is asking for a call back @ [phone].  If she should continue, she will need a refill by Dr. Dahlia Byes since all her other providers have either released her, retired or moved.  Thank you

## 2019-12-07 NOTE — Telephone Encounter (Signed)
I spoke with the patient and as she has never been seen by any of our providers here, they would not be able to manage her medication. She did see Dr Bary Castilla in June this year for her mammogram follow up. I advised her to call his office at [facility] and speak with his nurse about this as he would have to advise her about this medication. She is amendable to this.

## 2019-12-14 ENCOUNTER — Emergency Department
Admission: EM | Admit: 2019-12-14 | Discharge: 2019-12-14 | Disposition: A | Discharge status: Home / Self Care | Payer: Medicare Other | Attending: Emergency Medicine | Admitting: Emergency Medicine

## 2019-12-14 ENCOUNTER — Emergency Department: Payer: Medicare Other

## 2019-12-14 ENCOUNTER — Telehealth (HOSPITAL_COMMUNITY): Payer: Self-pay | Admitting: Nurse Practitioner

## 2019-12-14 ENCOUNTER — Encounter: Payer: Self-pay | Admitting: Emergency Medicine

## 2019-12-14 ENCOUNTER — Other Ambulatory Visit: Payer: Self-pay

## 2019-12-14 DIAGNOSIS — R5383 Other fatigue: Secondary | ICD-10-CM | POA: Diagnosis not present

## 2019-12-14 DIAGNOSIS — I110 Hypertensive heart disease with heart failure: Secondary | ICD-10-CM | POA: Insufficient documentation

## 2019-12-14 DIAGNOSIS — M791 Myalgia, unspecified site: Secondary | ICD-10-CM | POA: Diagnosis not present

## 2019-12-14 DIAGNOSIS — I509 Heart failure, unspecified: Secondary | ICD-10-CM | POA: Diagnosis not present

## 2019-12-14 DIAGNOSIS — R079 Chest pain, unspecified: Secondary | ICD-10-CM | POA: Diagnosis not present

## 2019-12-14 DIAGNOSIS — R0602 Shortness of breath: Secondary | ICD-10-CM | POA: Diagnosis not present

## 2019-12-14 DIAGNOSIS — J449 Chronic obstructive pulmonary disease, unspecified: Secondary | ICD-10-CM | POA: Insufficient documentation

## 2019-12-14 DIAGNOSIS — Z86718 Personal history of other venous thrombosis and embolism: Secondary | ICD-10-CM | POA: Insufficient documentation

## 2019-12-14 DIAGNOSIS — U071 COVID-19: Secondary | ICD-10-CM | POA: Diagnosis not present

## 2019-12-14 DIAGNOSIS — Z7982 Long term (current) use of aspirin: Secondary | ICD-10-CM | POA: Diagnosis not present

## 2019-12-14 DIAGNOSIS — I2510 Atherosclerotic heart disease of native coronary artery without angina pectoris: Secondary | ICD-10-CM | POA: Diagnosis not present

## 2019-12-14 DIAGNOSIS — Z853 Personal history of malignant neoplasm of breast: Secondary | ICD-10-CM | POA: Insufficient documentation

## 2019-12-14 DIAGNOSIS — Z79899 Other long term (current) drug therapy: Secondary | ICD-10-CM | POA: Insufficient documentation

## 2019-12-14 LAB — TROPONIN I (HIGH SENSITIVITY)
Troponin I (High Sensitivity): 6 ng/L (ref <18)
Troponin I (High Sensitivity): 7 ng/L (ref <18)

## 2019-12-14 LAB — CBC
HCT: 36.2 % (ref 36.0–46.0)
Hemoglobin: 12.5 g/dL (ref 12.0–15.0)
MCH: 31.6 pg (ref 26.0–34.0)
MCHC: 34.5 g/dL (ref 30.0–36.0)
MCV: 91.4 fL (ref 80.0–100.0)
Platelets: 150 10*3/uL (ref 150–400)
RBC: 3.96 MIL/uL (ref 3.87–5.11)
RDW: 12.9 % (ref 11.5–15.5)
WBC: 4.8 10*3/uL (ref 4.0–10.5)
nRBC: 0 % (ref 0.0–0.2)

## 2019-12-14 LAB — BASIC METABOLIC PANEL
Anion gap: 10 (ref 5–15)
BUN: 19 mg/dL (ref 8–23)
CO2: 23 mmol/L (ref 22–32)
Calcium: 9.3 mg/dL (ref 8.9–10.3)
Chloride: 103 mmol/L (ref 98–111)
Creatinine, Ser: 1.12 mg/dL — ABNORMAL HIGH (ref 0.44–1.00)
GFR calc Af Amer: 56 mL/min — ABNORMAL LOW (ref >60)
GFR calc non Af Amer: 48 mL/min — ABNORMAL LOW (ref >60)
Glucose, Bld: 118 mg/dL — ABNORMAL HIGH (ref 70–99)
Potassium: 3.6 mmol/L (ref 3.5–5.1)
Sodium: 136 mmol/L (ref 135–145)

## 2019-12-14 LAB — SARS CORONAVIRUS 2 BY RT PCR (HOSPITAL ORDER, PERFORMED IN ~~LOC~~ HOSPITAL LAB): SARS Coronavirus 2: POSITIVE — AB

## 2019-12-14 MED ORDER — IOHEXOL 350 MG/ML SOLN
75.0000 mL | Freq: Once | INTRAVENOUS | Status: AC | PRN
  Administered 2019-12-14: 75 mL via INTRAVENOUS

## 2019-12-14 MED ORDER — SODIUM CHLORIDE 0.9% FLUSH: 3.0000 mL | Freq: Once | INTRAVENOUS | Status: DC

## 2019-12-14 NOTE — ED Provider Notes (Signed)
[facility] Emergency Department Provider Note   ____________________________________________   First MD Initiated Contact with Patient 12/14/19 1559     (approximate)  I have reviewed the triage vital signs and the nursing notes.   HISTORY  Chief Complaint Chest Pain    HPI Lindsey Bentley is a 74 y.o. female here for evaluation of chest pain  Patient reports on Tuesday she felt fatigued and started having muscle aches including in her calves and upper arms as well as her chest. She reports it does not feel like tightness but feels like a pain or achiness.  Along with this she is also had  couple of days of fatigue. Very slight feeling of shortness of breath at times. Pain and discomfort in her chest is actually improved, but it has still persisted to some degree for 2 days prompting her to come in today. Overall she does feel slightly better.  No fevers. Reports body aches though. Felt warm as though she had a fever at 1 point.  Has not been exposed to Covid. No cough. No sputum production.    Past Medical History:  Diagnosis Date  . Arthritis   . Breast cancer (HCC)   . Breast cancer, left (HCC) 2016   LT LUMPECTOMY - TI, NO, MO - IDC, ER/PR pos, Her 2 neg, Rad tx's.   . Carotid artery occlusion   . Carotid artery occlusion   . 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 CHF (congestive heart failure) (HCC)   . COPD (chronic obstructive pulmonary disease) (HCC)   . Coronary artery disease   . Depression   . Diffuse cystic mastopathy 2013  . DVT (deep venous thrombosis) (HCC)   . Dyspnea   . Elevated TSH   . Family history of adverse reaction to anesthesia    Pt stated that son had a seizure with a combination of anesthesia and pain medication."  . Fatty liver   . GERD (gastroesophageal reflux disease)   . Glaucoma   . High cholesterol   . History of chicken pox   . Hyperglycemia   . Hyperlipidemia   .  Hypertension   . LVH (left ventricular hypertrophy)   . PAC (premature atrial contraction)   . Palpitations   . Peripheral vascular disease (HCC)   . Personal history of radiation therapy 2016   BREAST CA  . Pneumonia March 2014  . Skin cancer 2013  . Sleep apnea    wears CPAP set at 2.5  . Wears glasses     Patient Active Problem List   Diagnosis Date Noted  . Cracked skin on feet 10/01/2019  . History of colon polyps 10/01/2019  . Right groin pain 07/09/2019  . Seroma of breast 03/30/2018  . Abdominal pain 02/27/2018  . Bradycardia 10/18/2017  . 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 OSA (obstructive sleep apnea) 10/18/2017  . COPD (chronic obstructive pulmonary disease) (HCC) 08/26/2017  . PAC (premature atrial contraction) 03/22/2017  . Palpitations 03/22/2017  . Back pain 10/14/2016  . Diabetes mellitus with cardiac complication (HCC) 10/14/2016  . Bilateral leg edema 06/04/2016  . Carpal tunnel syndrome 04/12/2016  . Lymphedema 12/28/2015  . Carotid artery stenosis 12/28/2015  . Fullness of breast 12/19/2015  . Musculoskeletal pain 10/18/2015  . S/P spinal surgery 10/18/2015  . 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 Neck pain 10/10/2015  . Hypertensive left ventricular hypertrophy 06/26/2015  . Trigger middle finger of right hand 05/27/2015  .  Chest pain 05/08/2015  . SOB (shortness of breath) 05/08/2015  . Trigger finger, right 05/08/2015  . Combined fat and carbohydrate induced hyperlipemia 05/08/2015  . Pressure in head 02/08/2015  . Hypercholesterolemia 01/02/2015  . Breast cancer ([place]) 11/06/2014  . Health care maintenance 11/06/2014  . Ankle swelling 11/06/2014  . Stress 11/06/2014  . Neck fullness 06/29/2014  . Osteopenia 03/31/2014  . Snoring 11/05/2013  . Skin lesions 11/05/2013  . Light headedness 10/30/2013  . Abnormal liver function test 08/05/2012  . Thyroid nodule 05/31/2012  . CAD (coronary artery disease) 05/31/2012  . Hypertension 02/24/2012  .  Peripheral vascular disease ([place]) 02/24/2012  . GERD (gastroesophageal reflux disease) 02/24/2012    Past Surgical History:  Procedure Laterality Date  . BACK SURGERY  1986   ruptured disc  . BREAST BIOPSY Left 2008   NEG  . BREAST BIOPSY Left 08-20-14   POS  . BREAST BIOPSY Right 2007   NEG  . BREAST EXCISIONAL BIOPSY Left 1984   NEG  . 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 BREAST SURGERY Left 09/05/2014   T1a,N0; 5 mm  ER/PR positive. HER2 negative.  Wide excision with SLN biopsy.  MammoSite radiation  . CARPAL TUNNEL RELEASE    . CHOLECYSTECTOMY  2006  . COLONOSCOPY  2010   Dr. Jamal Collin  . COLONOSCOPY WITH PROPOFOL N/A 05/17/2017   Procedure: COLONOSCOPY WITH PROPOFOL;  Surgeon: Manya Silvas, MD;  Location: [facility] ENDOSCOPY;  Service: Endoscopy;  Laterality: N/A;  . DILATION AND CURETTAGE OF UTERUS    . ESOPHAGOGASTRODUODENOSCOPY (EGD) WITH PROPOFOL N/A 05/17/2017   Procedure: ESOPHAGOGASTRODUODENOSCOPY (EGD) WITH PROPOFOL;  Surgeon: Manya Silvas, MD;  Location: [facility] ENDOSCOPY;  Service: Endoscopy;  Laterality: N/A;  . LAPAROTOMY FOR REMOVAL TUMOR LUMBAR PLEXES    . Leg stent  2011  . MOLE REMOVAL  2013   15 removed  . POSTERIOR CERVICAL LAMINECTOMY Left 10/15/2015   Procedure: Left Cervical four- five Hemilaminectomy/Remove mass;  Surgeon: Leeroy Cha, MD;  Location: [place] NEURO ORS;  Service: Neurosurgery;  Laterality: Left;  Left C4-5 Hemilaminectomy/Remove mass    Prior to Admission medications   Medication Sig Start Date End Date Taking? Authorizing Provider  aspirin (BAYER ASPIRIN) 325 MG tablet Take 325 mg by mouth daily.   Yes [provider]  carvedilol (COREG) 12.5 MG tablet One tab po BID with meals Patient taking differently: Take 12.5 mg by mouth 2 (two) times daily with a meal. One tab po BID with meals 09/06/19  Yes Einar Pheasant, MD  Cholecalciferol (VITAMIN D-1000 MAX ST) 1000 units tablet Take 1,000 Units by mouth daily.    Yes [provider]  empagliflozin (JARDIANCE) 10 MG TABS tablet Take 10 mg by mouth daily before breakfast. 06/22/19  Yes Einar Pheasant, MD  hydrALAZINE (APRESOLINE) 10 MG tablet TAKE 1 TABLET(10 MG) BY MOUTH THREE TIMES DAILY Patient taking differently: Take 10 mg by mouth 3 (three) times daily.  09/27/19  Yes Einar Pheasant, MD  letrozole (Cochran) 2.5 MG tablet TAKE 1 TABLET BY MOUTH EVERY DAY Patient taking differently: Take 2.5 mg by mouth daily.  03/19/17  Yes Sankar, Seeplaputhur G, MD  losartan-hydrochlorothiazide (HYZAAR) 100-25 MG tablet Take 1 tablet by mouth daily. 09/06/19  Yes Einar Pheasant, MD  omeprazole (PRILOSEC) 40 MG capsule TAKE 1 CAPSULE(40 MG) BY MOUTH DAILY 12/05/19  Yes Einar Pheasant, MD  rosuvastatin (CRESTOR) 20 MG tablet TAKE 1 TABLET(20 MG) BY MOUTH DAILY 09/27/19  Yes Einar Pheasant,  MD  vitamin E 400 UNIT capsule Take 400 Units by mouth daily.   Yes [provider]  acetaminophen (TYLENOL) 650 MG suppository Place 650 mg rectally every 4 (four) hours as needed.    [provider]  meclizine (ANTIVERT) 25 MG tablet Take 1 tablet (25 mg total) by mouth 3 (three) times daily as needed for dizziness or nausea. 09/23/16   Paulette Blanch, MD  PROAIR HFA [phone] Base) MCG/ACT inhaler INL 2 INHALATIONS ITL Q 6 H PRF WHZ 11/17/17   [provider]    Allergies Codeine, Prednisone, Statins, and Tape  Family History  Problem Relation Age of Onset  . Cancer Father        Prostate  . Diabetes Father   . Cerebrovascular Accident Father   . Hypertension Mother   . AAA (abdominal aortic aneurysm) Mother   . Hyperlipidemia Other        Parent  . Miscarriages / Stillbirths Other        Parent  . Hypertension Other        parent  . Heart disease Other        Parent  . Breast cancer Maternal Aunt 72    Social History Social History   Tobacco Use  . Smoking status: Never Smoker  . Smokeless tobacco: Never Used  Substance Use Topics  . Alcohol  use: No    Alcohol/week: 0.0 standard drinks  . Drug use: No    Review of Systems Constitutional: Fatigue, felt like she might had a fever yesterday but did not register on temperature drainage Eyes: No visual changes. ENT: No sore throat. Cardiovascular: Weakness in the arms legs and chest.  Chest discomfort was worse Tuesday Respiratory: Slight feeling of shortness of breath Gastrointestinal: No abdominal pain.   Genitourinary: Negative for dysuria. Musculoskeletal: Negative for back pain. Skin: Negative for rash. Neurological: Negative for headaches, areas of focal weakness or numbness.    ____________________________________________   PHYSICAL EXAM:  VITAL SIGNS: ED Triage Vitals  Enc Vitals Group     BP 12/14/19 1048 (!) 148/68     Pulse Rate 12/14/19 1048 96     Resp 12/14/19 1048 16     Temp 12/14/19 1048 98.9 F (37.2 C)     Temp Source 12/14/19 1048 Oral     SpO2 12/14/19 1048 96 %     Weight 12/14/19 1046 201 lb 15.1 oz (91.6 kg)     Height 12/14/19 1046 '5\' 5"'$  (1.651 m)     Head Circumference --      Peak Flow --      Pain Score 12/14/19 1046 5     Pain Loc --      Pain Edu? --      Excl. in [place]? --     Constitutional: Alert and oriented. Well appearing and in no acute distress. Eyes: Conjunctivae are normal. Head: Atraumatic. Nose: No congestion/rhinnorhea. Mouth/Throat: Mucous membranes are moist. Neck: No stridor.  Cardiovascular: Normal rate, regular rhythm. Grossly normal heart sounds.  Good peripheral circulation.  No reproducible chest tenderness. Respiratory: Normal respiratory effort.  No retractions. Lungs CTAB. Gastrointestinal: Soft and nontender. No distention.  No lower extremity edema Musculoskeletal: No lower extremity tenderness nor edema.  No calf tenderness.  No venous cords or congestion. Neurologic:  Normal speech and language. No gross focal neurologic deficits are appreciated.  Skin:  Skin is warm, dry and intact. No rash  noted. Psychiatric: Mood and affect are normal. Speech  and behavior are normal.  ____________________________________________   LABS (all labs ordered are listed, but only abnormal results are displayed)  Labs Reviewed  SARS CORONAVIRUS 2 BY RT PCR ([facility]) - Abnormal; Notable for the following components:      Result Value   SARS Coronavirus 2 POSITIVE (*)    All other components within normal limits  BASIC METABOLIC PANEL - Abnormal; Notable for the following components:   Glucose, Bld 118 (*)    Creatinine, Ser 1.12 (*)    GFR calc non Af Amer 48 (*)    GFR calc Af Amer 56 (*)    All other components within normal limits  CBC  TROPONIN I (HIGH SENSITIVITY)  TROPONIN I (HIGH SENSITIVITY)   ____________________________________________  EKG  Reviewed inter by me at 11 AM Heart rate 65 QRS 85 QTc 420 Normal sinus rhythm, no evidence of acute ischemia or ectopy. ____________________________________________  RADIOLOGY  DG Chest 2 View  Result Date: 12/14/2019 CLINICAL DATA:  Chest pain, fatigue EXAM: CHEST - 2 VIEW COMPARISON:  09/25/2016 FINDINGS: Biapical scarring. No confluent airspace opacities or effusions. Heart is normal size. No acute bony abnormality. IMPRESSION: Biapical scarring.  No active disease. Electronically Signed   By: Rolm Baptise M.D.   On: 12/14/2019 11:24   CT Angio Chest PE W and/or Wo Contrast  Result Date: 12/14/2019 CLINICAL DATA:  Chest pain. Shortness of breath. COVID positive. History of breast cancer. EXAM: CT ANGIOGRAPHY CHEST WITH CONTRAST TECHNIQUE: Multidetector CT imaging of the chest was performed using the standard protocol during bolus administration of intravenous contrast. Multiplanar CT image reconstructions and MIPs were obtained to evaluate the vascular anatomy. CONTRAST:  46m OMNIPAQUE IOHEXOL 350 MG/ML SOLN COMPARISON:  June 03, 2017 FINDINGS: Cardiovascular: Contrast injection is  sufficient to demonstrate satisfactory opacification of the pulmonary arteries to the segmental level. There is no pulmonary embolus or evidence of right heart strain. The size of the main pulmonary artery is normal. Heart size is normal, with no pericardial effusion. There are atherosclerotic changes of the thoracic aorta without evidence for an aneurysm. There are coronary artery calcifications. Mediastinum/Nodes: -- No mediastinal lymphadenopathy. -- No hilar lymphadenopathy. --there is an unchanged cystic lesion in the left breast tissue. -- No supraclavicular lymphadenopathy. -- Normal thyroid gland where visualized. -  Unremarkable esophagus. Lungs/Pleura: Pleural based calcified plaques are again noted. There is some scarring and atelectasis at the lung bases. There is no pneumothorax. No significant pleural effusion. The trachea is unremarkable. Upper Abdomen: Contrast bolus timing is not optimized for evaluation of the abdominal organs. There is probable hepatomegaly with underlying hepatic steatosis. There is a stable appearing cystic lesion in the left hepatic lobe. The patient is status post prior cholecystectomy. Musculoskeletal: No chest wall abnormality. No bony spinal canal stenosis. Review of the MIP images confirms the above findings. IMPRESSION: 1. No evidence for pulmonary embolus. 2. No acute findings in the chest. 3. Calcified pleural plaques consistent with prior asbestos exposure. Aortic Atherosclerosis (ICD10-I70.0). Electronically Signed   By: CConstance HolsterM.D.   On: 12/14/2019 18:43    Imaging studies reviewed, no pulmonary embolism.  No acute chest findings ____________________________________________   PROCEDURES  Procedure(s) performed: None  Procedures  Critical Care performed: No  ____________________________________________   INITIAL IMPRESSION / ASSESSMENT AND PLAN / ED COURSE  Pertinent labs & imaging results that were available during my care of the  patient were reviewed by me and considered in my  medical decision making (see chart for details).   Differential diagnosis includes, but is not limited to, ACS, aortic dissection, pulmonary embolism, cardiac tamponade, pneumothorax, pneumonia, pericarditis, myocarditis, GI-related causes including esophagitis/gastritis, and musculoskeletal chest wall pain.    The patient's EKG and first troponin are quite reassuring.  Her symptoms seem atypical of coronary disease.  Associated with fatigue, myalgias, and slight cough.  Will obtain Covid test as patient not vaccinated and significant delta wave at this time.  She does overall appear well.  Her work of breathing is normal normal oxygen saturation.  Clear lungs.  History of previous DVT no longer anticoagulated as it is remote will obtain imaging to exclude PE.  Given the atypical nature of her symptoms will obtain second troponin, if this is negative favor extremely unlikely to represent ACS based on this clinical symptomatology.  No abdominal pain.  No referred pain.  No ripping or tearing or moving pain to suggest dissection.    Clinical Course as of Dec 13 1929  Thu Dec 14, 2019  1901 Paged Dr. Steva Ready for further treatment recommendations especially given the patient's multiple comorbidities for potential to develop severe COVID-19 infection.  She has positive COVID-19 infection, but I am concerned she has multiple comorbidities   [MQ]  1906 Dr. Delaine Lame recommends monoclonal antibiody treatment via [facility] at outpatient given reassuing work-up and would qualify for this outpatient treatment.    [MQ]  1908 Call outpatient infusion center hotline # [phone] or via secure chat to [facility]   [MQ]    Clinical Course User Index [MQ] Delman Kitten, MD   ----------------------------------------- 7:31 PM on 12/14/2019 -----------------------------------------  Home patient understanding agreeable with very careful return  precautions especially discussed those around shortness of breath or worsening symptoms that could be related to COVID-19 or chest pain. HEAR Score: 4   Troponins reassuring.  Have discussed and arranged for patient to be closely contacted for monoclonal antibody infusion center team to review tomorrow   ____________________________________________   FINAL CLINICAL IMPRESSION(S) / ED DIAGNOSES  Final diagnoses:  COVID-19  Myalgia        Note:  This document was prepared using Dragon voice recognition software and may include unintentional dictation errors       Delman Kitten, MD 12/14/19 1931

## 2019-12-14 NOTE — ED Notes (Addendum)
Dr Jacqualine Code and CT aware of covid positive status

## 2019-12-14 NOTE — ED Notes (Signed)
Pt taken for CT 

## 2019-12-14 NOTE — ED Notes (Signed)
IV team at bedside 

## 2019-12-14 NOTE — Telephone Encounter (Signed)
Called to Discuss with patient about Covid symptoms and the use of regeneron, a monoclonal antibody infusion for those with mild to moderate Covid symptoms and at a high risk of hospitalization.     Pt is qualified for this infusion at the [facility] infusion center due to co-morbid conditions and/or a member of an at-risk group.     Unable to reach pt. Left message to return call. Spoke to husband. Left message with him.   Beckey Rutter, [place], AGNP-C [phone] ([place])

## 2019-12-14 NOTE — ED Triage Notes (Signed)
C/O fatigue, chest pain, bilateral arm pain since Thursday.  States pain has lessened, but persists.  Also some SOB and cough.  AAOx3.  Skin warm and dry. No SOB/ DOE.  NAD

## 2019-12-14 NOTE — ED Notes (Signed)
Pt unable to sign E-signature due to signature pad malfunction. Pt verbalized understanding of d/c instructions and had no additional questions or concerns for this RN or provider. Pt left with d/c instructions and gathered all personal belongings from room and removed them prior to ED departure.   

## 2019-12-15 ENCOUNTER — Other Ambulatory Visit: Payer: Self-pay | Admitting: Nurse Practitioner

## 2019-12-15 ENCOUNTER — Telehealth: Payer: Self-pay | Admitting: Internal Medicine

## 2019-12-15 DIAGNOSIS — E1159 Type 2 diabetes mellitus with other circulatory complications: Secondary | ICD-10-CM

## 2019-12-15 DIAGNOSIS — U071 COVID-19: Secondary | ICD-10-CM

## 2019-12-15 NOTE — Telephone Encounter (Signed)
Pt would like for you to review hospital info. Just an fyi for you

## 2019-12-15 NOTE — Progress Notes (Signed)
I connected by phone with Lindsey Bentley on 12/15/2019 at 8:43 AM to discuss the potential use of a new treatment for mild to moderate COVID-19 viral infection in non-hospitalized patients.  This patient is a 74 y.o. female that meets the FDA criteria for Emergency Use Authorization of COVID monoclonal antibody casirivimab/imdevimab.  Has a (+) direct SARS-CoV-2 viral test result  Has mild or moderate COVID-19   Is NOT hospitalized due to COVID-19  Is within 10 days of symptom onset  Has at least one of the high risk factor(s) for progression to severe COVID-19 and/or hospitalization as defined in EUA.  Specific high risk criteria : Diabetes   I have spoken and communicated the following to the patient or parent/caregiver regarding COVID monoclonal antibody treatment:  1. FDA has authorized the emergency use for the treatment of mild to moderate COVID-19 in adults and pediatric patients with positive results of direct SARS-CoV-2 viral testing who are 48 years of age and older weighing at least 40 kg, and who are at high risk for progressing to severe COVID-19 and/or hospitalization.  2. The significant known and potential risks and benefits of COVID monoclonal antibody, and the extent to which such potential risks and benefits are unknown.  3. Information on available alternative treatments and the risks and benefits of those alternatives, including clinical trials.  4. Patients treated with COVID monoclonal antibody should continue to self-isolate and use infection control measures (e.g., wear mask, isolate, social distance, avoid sharing personal items, clean and disinfect "high touch" surfaces, and frequent handwashing) according to CDC guidelines.   5. The patient or parent/caregiver has the option to accept or refuse COVID monoclonal antibody treatment.  After reviewing this information with the patient, The patient agreed to proceed with receiving casirivimab\imdevimab infusion and  will be provided a copy of the Fact sheet prior to receiving the infusion. Fenton Foy 12/15/2019 8:43 AM

## 2019-12-15 NOTE — Telephone Encounter (Signed)
Pt wants Dr. Nicki Reaper she has covid and she went to ED yesterday but they sent her home because it wasn't in her lungs. She will be having an infusion. She said she is doing ok but wants Dr. Nicki Reaper to look at information from hospital visit yesterday.

## 2019-12-16 ENCOUNTER — Other Ambulatory Visit (HOSPITAL_COMMUNITY): Payer: Self-pay | Admitting: *Deleted

## 2019-12-16 ENCOUNTER — Ambulatory Visit (HOSPITAL_COMMUNITY)
Admission: RE | Admit: 2019-12-16 | Discharge: 2019-12-16 | Disposition: A | Discharge status: Home / Self Care | Payer: Medicare Other | Source: Ambulatory Visit | Attending: Pulmonary Disease | Admitting: Pulmonary Disease

## 2019-12-16 DIAGNOSIS — Z23 Encounter for immunization: Secondary | ICD-10-CM | POA: Insufficient documentation

## 2019-12-16 DIAGNOSIS — E1159 Type 2 diabetes mellitus with other circulatory complications: Secondary | ICD-10-CM

## 2019-12-16 DIAGNOSIS — U071 COVID-19: Secondary | ICD-10-CM | POA: Insufficient documentation

## 2019-12-16 MED ORDER — ALBUTEROL SULFATE HFA 108 (90 BASE) MCG/ACT IN AERS: 2.0000 | INHALATION_SPRAY | Freq: Once | RESPIRATORY_TRACT | Status: DC | PRN

## 2019-12-16 MED ORDER — SODIUM CHLORIDE 0.9 % IV SOLN
1200.0000 mg | Freq: Once | INTRAVENOUS | Status: AC
  Administered 2019-12-16: 1200 mg via INTRAVENOUS
  Filled 2019-12-16: qty 1200

## 2019-12-16 MED ORDER — SODIUM CHLORIDE 0.9 % IV SOLN: INTRAVENOUS | Status: DC | PRN

## 2019-12-16 MED ORDER — FAMOTIDINE IN NACL 20-0.9 MG/50ML-% IV SOLN: 20.0000 mg | Freq: Once | INTRAVENOUS | Status: DC | PRN

## 2019-12-16 MED ORDER — DIPHENHYDRAMINE HCL 50 MG/ML IJ SOLN: 50.0000 mg | Freq: Once | INTRAMUSCULAR | Status: DC | PRN

## 2019-12-16 MED ORDER — EPINEPHRINE 0.3 MG/0.3ML IJ SOAJ: 0.3000 mg | Freq: Once | INTRAMUSCULAR | Status: DC | PRN

## 2019-12-16 MED ORDER — METHYLPREDNISOLONE SODIUM SUCC 125 MG IJ SOLR: 125.0000 mg | Freq: Once | INTRAMUSCULAR | Status: DC | PRN

## 2019-12-16 NOTE — Progress Notes (Signed)
  Diagnosis: COVID-19  Physician: Dr. Tia Masker   Procedure: Covid Infusion Clinic Med: casirivimab\imdevimab infusion - Provided patient with casirivimab\imdevimab fact sheet for patients, parents and caregivers prior to infusion.  Complications: No immediate complications noted.  Discharge: Discharged home at [address] 12/16/2019

## 2019-12-16 NOTE — Discharge Instructions (Signed)

## 2019-12-16 NOTE — Telephone Encounter (Signed)
Reviewed note and ER records.  Pt diagnosed with covid.  Please call and confirm that pt is doing ok.  Per her note, states is planning to receive monoclonal ab infusion.  Please confirm she is scheduled to get the  infusion.  Also, see if needs a f/u virtual visit with me.  Thanks .

## 2019-12-18 NOTE — Telephone Encounter (Signed)
Patient says she has had the infusion, that she feels weak, but other than fatigue she feels ok, Patient has been testing 02 sats with pulse [facility]X and 02 sats running between 94-96, advised if 02 sats drop to 90 and stay call us or if drops below 90 she needs to be evaluated. Patient says she does feel she needs a visit at this time but will call back if symptoms worsen.

## 2019-12-28 DIAGNOSIS — G4733 Obstructive sleep apnea (adult) (pediatric): Secondary | ICD-10-CM | POA: Diagnosis not present

## 2019-12-29 ENCOUNTER — Other Ambulatory Visit: Payer: Medicare Other

## 2020-01-02 ENCOUNTER — Encounter: Payer: Self-pay | Admitting: Internal Medicine

## 2020-01-02 ENCOUNTER — Telehealth (INDEPENDENT_AMBULATORY_CARE_PROVIDER_SITE_OTHER): Payer: Medicare Other | Admitting: Internal Medicine

## 2020-01-02 DIAGNOSIS — I6523 Occlusion and stenosis of bilateral carotid arteries: Secondary | ICD-10-CM

## 2020-01-02 DIAGNOSIS — C50912 Malignant neoplasm of unspecified site of left female breast: Secondary | ICD-10-CM

## 2020-01-02 DIAGNOSIS — K219 Gastro-esophageal reflux disease without esophagitis: Secondary | ICD-10-CM

## 2020-01-02 DIAGNOSIS — R945 Abnormal results of liver function studies: Secondary | ICD-10-CM

## 2020-01-02 DIAGNOSIS — J449 Chronic obstructive pulmonary disease, unspecified: Secondary | ICD-10-CM

## 2020-01-02 DIAGNOSIS — I739 Peripheral vascular disease, unspecified: Secondary | ICD-10-CM | POA: Diagnosis not present

## 2020-01-02 DIAGNOSIS — F439 Reaction to severe stress, unspecified: Secondary | ICD-10-CM

## 2020-01-02 DIAGNOSIS — E7800 Pure hypercholesterolemia, unspecified: Secondary | ICD-10-CM

## 2020-01-02 DIAGNOSIS — Z8616 Personal history of COVID-19: Secondary | ICD-10-CM

## 2020-01-02 DIAGNOSIS — I25119 Atherosclerotic heart disease of native coronary artery with unspecified angina pectoris: Secondary | ICD-10-CM

## 2020-01-02 DIAGNOSIS — R7989 Other specified abnormal findings of blood chemistry: Secondary | ICD-10-CM

## 2020-01-02 DIAGNOSIS — E1159 Type 2 diabetes mellitus with other circulatory complications: Secondary | ICD-10-CM

## 2020-01-02 DIAGNOSIS — I10 Essential (primary) hypertension: Secondary | ICD-10-CM | POA: Diagnosis not present

## 2020-01-02 NOTE — Progress Notes (Signed)
[name], female   DOB: [birth year], 74 y.o.   MRN: [id number]   Virtual Visit via video Note  This visit type was conducted due to national recommendations for restrictions regarding the COVID-19 pandemic (e.g. social distancing).  This format is felt to be most appropriate for this patient at this time.  All issues noted in this document were discussed and addressed.  No physical exam was performed (except for noted visual exam findings with Video Visits).   I connected with Lindsey Bentley by a video enabled telemedicine application and verified that I am speaking with the correct person using two identifiers. Location patient: home Location provider: work Persons participating in the virtual visit: patient, provider  The limitations, risks, security and privacy concerns of performing an evaluation and management service by video and the availability of in person appointments have been discussed.  It has also been discussed with the patient that there may be a patient responsible charge related to this service. The patient expressed understanding and agreed to proceed.   Reason for visit: scheduled follow up.   HPI: Was seen in ER 12/14/19 with muscle aches, fatigue, chest discomfort and minimal sob. Diagnosed with covid.  Discharged and received antibody infusion. She is feeling better.  Loss of taste and loss of smell - getting back to normal.  Started being more active this past weekend.  Going out more.  No chest pain or sob currently.  No increased cough or congestion.  No fever.  Eating.  No nausea or vomiting.  Increased stress.  Husband with dementia.  Has support - children supportive.  States AM sugars averaging 113-121.  Highest 132.  PM sugars 130-170.  States needs refill jardiance.  D/w Catie. Blood pressure doing well.      ROS: See pertinent positives and negatives per HPI.  Past Medical History:  Diagnosis Date  . Arthritis   . Breast cancer ([place])   . Breast  cancer, left ([place]) 2016   LT LUMPECTOMY - TI, NO, MO - IDC, ER/PR pos, Her 2 neg, Rad tx's.   . Carotid artery occlusion   . Carotid artery occlusion   . 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 CHF (congestive heart failure) ([place])   . COPD (chronic obstructive pulmonary disease) ([place])   . Coronary artery disease   . Depression   . Diffuse cystic mastopathy 2013  . DVT (deep venous thrombosis) ([place])   . Dyspnea   . Elevated TSH   . Family history of adverse reaction to anesthesia    Pt stated that son had a seizure with a combination of anesthesia and pain medication."  . Fatty liver   . GERD (gastroesophageal reflux disease)   . Glaucoma   . High cholesterol   . History of chicken pox   . Hyperglycemia   . Hyperlipidemia   . Hypertension   . LVH (left ventricular hypertrophy)   . PAC (premature atrial contraction)   . Palpitations   . Peripheral vascular disease ([place])   . Personal history of radiation therapy 2016   BREAST CA  . Pneumonia March 2014  . Skin cancer 2013  . Sleep apnea    wears CPAP set at 2.5  . Wears glasses     Past Surgical History:  Procedure Laterality Date  . BACK SURGERY  1986   ruptured disc  . BREAST BIOPSY Left 2008   NEG  . BREAST BIOPSY Left 08-20-14  POS  . BREAST BIOPSY Right 2007   NEG  . BREAST EXCISIONAL BIOPSY Left 1984   NEG  . 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 BREAST SURGERY Left 09/05/2014   T1a,N0; 5 mm  ER/PR positive. HER2 negative.  Wide excision with SLN biopsy.  MammoSite radiation  . CARPAL TUNNEL RELEASE    . CHOLECYSTECTOMY  2006  . COLONOSCOPY  2010   Dr. Jamal Collin  . COLONOSCOPY WITH PROPOFOL N/A 05/17/2017   Procedure: COLONOSCOPY WITH PROPOFOL;  Surgeon: Manya Silvas, MD;  Location: [facility] ENDOSCOPY;  Service: Endoscopy;  Laterality: N/A;  . DILATION AND CURETTAGE OF UTERUS    . ESOPHAGOGASTRODUODENOSCOPY (EGD) WITH PROPOFOL N/A 05/17/2017   Procedure: ESOPHAGOGASTRODUODENOSCOPY  (EGD) WITH PROPOFOL;  Surgeon: Manya Silvas, MD;  Location: [facility] ENDOSCOPY;  Service: Endoscopy;  Laterality: N/A;  . LAPAROTOMY FOR REMOVAL TUMOR LUMBAR PLEXES    . Leg stent  2011  . MOLE REMOVAL  2013   15 removed  . POSTERIOR CERVICAL LAMINECTOMY Left 10/15/2015   Procedure: Left Cervical four- five Hemilaminectomy/Remove mass;  Surgeon: Leeroy Cha, MD;  Location: [facility] NEURO ORS;  Service: Neurosurgery;  Laterality: Left;  Left C4-5 Hemilaminectomy/Remove mass    Family History  Problem Relation Age of Onset  . Cancer Father        Prostate  . Diabetes Father   . Cerebrovascular Accident Father   . Hypertension Mother   . AAA (abdominal aortic aneurysm) Mother   . Hyperlipidemia Other        Parent  . Miscarriages / Stillbirths Other        Parent  . Hypertension Other        parent  . Heart disease Other        Parent  . Breast cancer Maternal Aunt 65    SOCIAL HX: reviewed.    Current Outpatient Medications:  .  acetaminophen (TYLENOL) 650 MG suppository, Place 650 mg rectally every 4 (four) hours as needed., Disp: , Rfl:  .  aspirin (BAYER ASPIRIN) 325 MG tablet, Take 325 mg by mouth daily., Disp: , Rfl:  .  carvedilol (COREG) 12.5 MG tablet, One tab po BID with meals (Patient taking differently: Take 12.5 mg by mouth 2 (two) times daily with a meal. One tab po BID with meals), Disp: 180 tablet, Rfl: 1 .  Cholecalciferol (VITAMIN D-1000 MAX ST) 1000 units tablet, Take 1,000 Units by mouth daily. , Disp: , Rfl:  .  empagliflozin (JARDIANCE) 10 MG TABS tablet, Take 10 mg by mouth daily before breakfast., Disp: 30 tablet, Rfl: 2 .  hydrALAZINE (APRESOLINE) 10 MG tablet, TAKE 1 TABLET(10 MG) BY MOUTH THREE TIMES DAILY (Patient taking differently: Take 10 mg by mouth 3 (three) times daily. ), Disp: 270 tablet, Rfl: 1 .  letrozole (FEMARA) 2.5 MG tablet, TAKE 1 TABLET BY MOUTH EVERY DAY (Patient taking differently: Take 2.5 mg by mouth daily. ), Disp: 90 tablet, Rfl: 4 .   losartan-hydrochlorothiazide (HYZAAR) 100-25 MG tablet, Take 1 tablet by mouth daily., Disp: 90 tablet, Rfl: 1 .  meclizine (ANTIVERT) 25 MG tablet, Take 1 tablet (25 mg total) by mouth 3 (three) times daily as needed for dizziness or nausea., Disp: 20 tablet, Rfl: 0 .  omeprazole (PRILOSEC) 40 MG capsule, TAKE 1 CAPSULE(40 MG) BY MOUTH DAILY, Disp: 90 capsule, Rfl: 0 .  PROAIR HFA 108 (90 Base) MCG/ACT inhaler, INL 2 INHALATIONS ITL Q 6 H PRF WHZ, Disp: , Rfl: 1 .  rosuvastatin (CRESTOR) 20 MG tablet, TAKE 1 TABLET(20 MG) BY MOUTH DAILY, Disp: 90 tablet, Rfl: 1 .  vitamin E 400 UNIT capsule, Take 400 Units by mouth daily., Disp: , Rfl:   EXAM:  VITALS per patient if applicable: 161/09  GENERAL: alert, oriented, appears well and in no acute distress  HEENT: atraumatic, conjunttiva clear, no obvious abnormalities on inspection of external nose and ears  NECK: normal movements of the head and neck  LUNGS: on inspection no signs of respiratory distress, breathing rate appears normal, no obvious gross SOB, gasping or wheezing  CV: no obvious cyanosis  PSYCH/NEURO: pleasant and cooperative, no obvious depression or anxiety, speech and thought processing grossly intact  ASSESSMENT AND PLAN:  Discussed the following assessment and plan:  Stress Increased stress as outlined.  Discussed with her today.  Has good support.  Follow.    Peripheral vascular disease ([place]) Followed by [facility].  Last evaluated 04/2018.  Recommended f/u in 2 years.  Stable.  Continue aspirin and crestor.   Hypertension Blood pressure as outlined.  conitnue losartan/hctz and coreg.  Follow pressures.  Follow metabolic panel.   Hypercholesterolemia On crestor.  Low cholesterol diet and exercise.  Follow lipid panel and liver function tests.    GERD (gastroesophageal reflux disease) No upper symptoms reported.  On prilosec.   Diabetes mellitus with cardiac complication (HCC) Low carb diet and exercise.  On  jardiance.  Tolerating.  Sugars as outlined.  Needs refill.  Contact Catie to see what is needed for refills.  Low carb diet and exercise.  Follow   COPD (chronic obstructive pulmonary disease) (HCC)  Breathing has been stable.  Rescue inhaler prn.  Has seen pulmonary.   Carotid artery stenosis Followed by [facility].  Last evaluated in 04/2018.  Recommended f/u in 2 years.    CAD (coronary artery disease) Followed by cardiology.  Continue risk factor modification.  She fees - stable.   Breast cancer ([place]) Followed by Dr Bary Castilla. On femara.  Mammogram 10/31/19 - birads II.  Abnormal liver function test Saw GI.  Fatty liver on CT. Diet, exercise and weight loss.  Follow liver function tests.    History of COVID-19 Taste and smell getting back to normal.  No sob.  Breathing stable.  Energy improving. Follow.      I discussed the assessment and treatment plan with the patient. The patient was provided an opportunity to ask questions and all were answered. The patient agreed with the plan and demonstrated an understanding of the instructions.   The patient was advised to call back or seek an in-person evaluation if the symptoms worsen or if the condition fails to improve as anticipated.   Einar Pheasant, MD

## 2020-01-03 ENCOUNTER — Telehealth: Payer: Self-pay | Admitting: Internal Medicine

## 2020-01-03 NOTE — Telephone Encounter (Signed)
For refills from [facility], patient needs to call the company at [phone] to place the refill order.   Lindsey Bentley, could you pass this along to her? Thanks!

## 2020-01-03 NOTE — Telephone Encounter (Signed)
Patient aware.

## 2020-01-03 NOTE — Telephone Encounter (Signed)
Had a virtual visit with Lindsey Bentley 01/02/20.  She wanted me to inform you she needs refill on jardiance.  She stated you were working with her to get the information and she was to let you know when she needed.  Let me know what I need to do.    Thank you.

## 2020-01-07 ENCOUNTER — Encounter: Payer: Self-pay | Admitting: Internal Medicine

## 2020-01-07 DIAGNOSIS — Z8616 Personal history of COVID-19: Secondary | ICD-10-CM | POA: Insufficient documentation

## 2020-01-07 NOTE — Assessment & Plan Note (Signed)
Saw GI.  Fatty liver on CT. Diet, exercise and weight loss.  Follow liver function tests.

## 2020-01-07 NOTE — Assessment & Plan Note (Signed)
On crestor.  Low cholesterol diet and exercise.  Follow lipid panel and liver function tests.   

## 2020-01-07 NOTE — Assessment & Plan Note (Signed)
Followed by Dr Bary Castilla. On femara.  Mammogram 10/31/19 - birads II.

## 2020-01-07 NOTE — Assessment & Plan Note (Signed)
Breathing has been stable.  Rescue inhaler prn.  Has seen pulmonary.

## 2020-01-07 NOTE — Assessment & Plan Note (Signed)
Low carb diet and exercise.  On jardiance.  Tolerating.  Sugars as outlined.  Needs refill.  Contact Catie to see what is needed for refills.  Low carb diet and exercise.  Follow

## 2020-01-07 NOTE — Assessment & Plan Note (Signed)
Blood pressure as outlined.  conitnue losartan/hctz and coreg.  Follow pressures.  Follow metabolic panel.

## 2020-01-07 NOTE — Assessment & Plan Note (Signed)
Taste and smell getting back to normal.  No sob.  Breathing stable.  Energy improving. Follow.

## 2020-01-07 NOTE — Assessment & Plan Note (Signed)
Followed by [facility].  Last evaluated in 04/2018.  Recommended f/u in 2 years.

## 2020-01-07 NOTE — Assessment & Plan Note (Signed)
Followed by [facility].  Last evaluated 04/2018.  Recommended f/u in 2 years.  Stable.  Continue aspirin and crestor.

## 2020-01-07 NOTE — Assessment & Plan Note (Signed)
Increased stress as outlined.  Discussed with her today.  Has good support.  Follow.

## 2020-01-07 NOTE — Assessment & Plan Note (Signed)
No upper symptoms reported. On prilosec.  

## 2020-01-07 NOTE — Assessment & Plan Note (Signed)
Followed by cardiology.  Continue risk factor modification.  She fees - stable.

## 2020-01-18 ENCOUNTER — Telehealth: Payer: Self-pay | Admitting: Internal Medicine

## 2020-01-18 NOTE — Telephone Encounter (Signed)
Signed and placed in box.   

## 2020-01-18 NOTE — Telephone Encounter (Signed)
Patient called and said she spoke with Larena Glassman about a handicapp form being filled by Dr. Nicki Reaper. Patient called today to see if it is ready, no form up front.

## 2020-01-18 NOTE — Telephone Encounter (Signed)
Called patient. Call disconnected.

## 2020-01-18 NOTE — Telephone Encounter (Signed)
Form filled out and placed out for signature

## 2020-01-19 NOTE — Telephone Encounter (Signed)
Patient has picked up °

## 2020-01-22 ENCOUNTER — Encounter: Payer: Self-pay | Admitting: *Deleted

## 2020-01-29 ENCOUNTER — Telehealth: Payer: Self-pay | Admitting: Internal Medicine

## 2020-01-29 ENCOUNTER — Ambulatory Visit (INDEPENDENT_AMBULATORY_CARE_PROVIDER_SITE_OTHER): Payer: Medicare Other | Admitting: Pharmacist

## 2020-01-29 DIAGNOSIS — Z8616 Personal history of COVID-19: Secondary | ICD-10-CM

## 2020-01-29 DIAGNOSIS — E1159 Type 2 diabetes mellitus with other circulatory complications: Secondary | ICD-10-CM

## 2020-01-29 DIAGNOSIS — M8580 Other specified disorders of bone density and structure, unspecified site: Secondary | ICD-10-CM

## 2020-01-29 NOTE — Telephone Encounter (Signed)
Received notification from Lindsey Bentley to clarify a few different things, letrozole, etc. Please confirm pt doing ok.   Please schedule her an appt.  Ok if virtual add on.

## 2020-01-29 NOTE — Chronic Care Management (AMB) (Signed)
Chronic Care Management   Follow Up Note   01/29/2020 Name: Lindsey Bentley MRN: [id number] DOB: 12-Mar-1946  Referred by: Lindsey Bentley Reason for referral : Chronic Care Management (Medication Management)   Lindsey Bentley is a 74 y.o. year old female who is a primary care patient of Lindsey Bentley. The CCM team was consulted for assistance with chronic disease management and care coordination needs.    Contacted patient for medication management f/u.  Review of patient status, including review of consultants reports, relevant laboratory and other test results, and collaboration with appropriate care team members and the patient's provider was performed as part of comprehensive patient evaluation and provision of chronic care management services.    SDOH (Social Determinants of Health) assessments performed: Yes See Care Plan activities for detailed interventions related to SDOH)  SDOH Interventions     Most Recent Value  SDOH Interventions  Financial Strain Interventions Other (Comment)  [manufacturer assistance]       Outpatient Encounter Medications as of 01/29/2020  Medication Sig Note  . acetaminophen (TYLENOL) 650 MG suppository Place 650 mg rectally every 4 (four) hours as needed.   [name] aspirin (BAYER ASPIRIN) 325 MG tablet Take 325 mg by mouth daily.   . carvedilol (COREG) 12.5 MG tablet One tab po BID with meals (Patient taking differently: Take 12.5 mg by mouth 2 (two) times daily with a meal. One tab po BID with meals)   . Cholecalciferol (VITAMIN D-1000 MAX ST) 1000 units tablet Take 1,000 Units by mouth daily.    . empagliflozin (JARDIANCE) 10 MG TABS tablet Take 10 mg by mouth daily before breakfast.   . hydrALAZINE (APRESOLINE) 10 MG tablet TAKE 1 TABLET(10 MG) BY MOUTH THREE TIMES DAILY (Patient taking differently: Take 10 mg by mouth 3 (three) times daily. )   . losartan-hydrochlorothiazide (HYZAAR) 100-25 MG tablet Take 1 tablet by mouth daily.   [name]  omeprazole (PRILOSEC) 40 MG capsule TAKE 1 CAPSULE(40 MG) BY MOUTH DAILY   . rosuvastatin (CRESTOR) 20 MG tablet TAKE 1 TABLET(20 MG) BY MOUTH DAILY   . vitamin E 400 UNIT capsule Take 400 Units by mouth daily.   [name] letrozole (FEMARA) 2.5 MG tablet TAKE 1 TABLET BY MOUTH EVERY DAY (Patient not taking: Reported on 01/29/2020) 12/14/2019: Recently discontinued  . meclizine (ANTIVERT) 25 MG tablet Take 1 tablet (25 mg total) by mouth 3 (three) times daily as needed for dizziness or nausea. (Patient not taking: Reported on 01/29/2020) 03/23/2019: PRN  . PROAIR HFA 108 (90 Base) MCG/ACT inhaler INL 2 INHALATIONS ITL Q 6 H PRF WHZ (Patient not taking: Reported on 01/29/2020) 03/23/2019: PRN   No facility-administered encounter medications on file as of 01/29/2020.     Objective:   Goals Addressed              This Visit's Progress     Patient Stated   .  "I want to work on my blood sugars" (pt-stated)        Lindsey Bentley (see longtitudinal plan of care for additional care plan information)  Current Barriers:  . Social, community, and financial barriers:  o S/p COVID. Notes that she is feeling much better.  . Diabetes: uncontrolled; complicated by HTN, CAD, most recent A1c 7.4% . Current antihyperglycemic regimen: Jardiance 10 mg daily  o APPROVED for patient assistance for Jardiance through 05/10/20 o Hx intolerance to metformin, loose stools  . Current glucose readings:  o Throughout the day all <130 .  Cardiovascular risk reduction: o Current hypertensive regimen: hydralazine 10 mg TID, losartan/HCTZ 100/25 mg daily, carvedilol 6.25 mg BID;  o Current hyperlipidemia regimen: rosuvastatin 20 mg daily; last LDL NOT at goal <70. Patient declined increasing rosuvastatin at that time o Current antiplatelet regimen: ASA 325 mg daily; chart review does not reveal hx TIA/CVA, but patient does have PVD, followed by vascular  . Hx breast cancer: T1, N0, M0, ER/PR positive, HER 2 negative; Notes  that she was d/c by Dr. Donella Bentley, and has ceased f/u with Dr. Bary Bentley since he moved practices. She notes that she has been on the medication for at least 5 and a half years. Self-discontinued letrozole because she reports it was causing painful, itchy moles. 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 Comprehensive medication review performed, medication list updated in electronic medical record . Inter-disciplinary care team collaboration (see longitudinal plan of care) . Encouraged continued adherence. Pending A1c, can increase Jardiance if needed for improved glycemic benefit.  . F/u lab work has been ordered, but patient not due for PCP f/u in late November. Will collaborate w/ PCP to determine if she wants patient to go ahead and get any lab work, or wait until before late November. Will collaborate w/ office staff to schedule for lab work and PCP appt as needed.  . Discussed w/ patient that it warrants a discussion w/ PCP to determine if appropriate to d/c letrozole at this time, or if extended duration therapy is necessary.  . Recommend f/u DEXA in the near future, given long term letrozole thearpy  Patient Self Care Activities:  . Patient will check blood glucose BID , document, and provide at future appointments . Patient will take medications as prescribed . Patient will report any questions or concerns to provider   Please see past updates related to this goal by clicking on the "Past Updates" button in the selected goal          Plan:  - Will await decision from provider about lab work to determine f/u plan.   Lindsey Bentley, PharmD, [place], CPP Clinical Pharmacist [place] [phone]

## 2020-01-29 NOTE — Patient Instructions (Signed)
Visit Information  Goals Addressed              This Visit's Progress     Patient Stated   .  "I want to work on my blood sugars" (pt-stated)        [place] (see longtitudinal plan of care for additional care plan information)  Current Barriers:  . Social, community, and financial barriers:  o S/p COVID. Notes that she is feeling much better.  . Diabetes: uncontrolled; complicated by HTN, CAD, most recent A1c 7.4% . Current antihyperglycemic regimen: Jardiance 10 mg daily  o APPROVED for patient assistance for Jardiance through 05/10/20 o Hx intolerance to metformin, loose stools  . Current glucose readings:  o Throughout the day all <130 . Cardiovascular risk reduction: o Current hypertensive regimen: hydralazine 10 mg TID, losartan/HCTZ 100/25 mg daily, carvedilol 6.25 mg BID;  o Current hyperlipidemia regimen: rosuvastatin 20 mg daily; last LDL NOT at goal <70. Patient declined increasing rosuvastatin at that time o Current antiplatelet regimen: ASA 325 mg daily; chart review does not reveal hx TIA/CVA, but patient does have PVD, followed by vascular  . Hx breast cancer: T1, N0, M0, ER/PR positive, HER 2 negative; Notes that she was d/c by Dr. Donella Stade, and has ceased f/u with Dr. Bary Castilla since he moved practices. She notes that she has been on the medication for at least 5 and a half years. Self-discontinued letrozole because she reports it was causing painful, itchy moles. 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 Comprehensive medication review performed, medication list updated in electronic medical record . Inter-disciplinary care team collaboration (see longitudinal plan of care) . Encouraged continued adherence. Pending A1c, can increase Jardiance if  needed for improved glycemic benefit.  . F/u lab work has been ordered, but patient not due for PCP f/u in late November. Will collaborate w/ PCP to determine if she wants patient to go ahead and get any lab work, or wait until before late November. Will collaborate w/ office staff to schedule for lab work and PCP appt as needed.  . Discussed w/ patient that it warrants a discussion w/ PCP to determine if appropriate to d/c letrozole at this time, or if extended duration therapy is necessary.  . Recommend f/u DEXA in the near future, given long term letrozole thearpy  Patient Self Care Activities:  . Patient will check blood glucose BID , document, and provide at future appointments . Patient will take medications as prescribed . Patient will report any questions or concerns to provider   Please see past updates related to this goal by clicking on the "Past Updates" button in the selected goal         The patient verbalized understanding of instructions provided today and declined a print copy of patient instruction materials.    Plan:  - Will await decision from provider about lab work to determine f/u plan.   Catie Darnelle Maffucci, PharmD, [place], CPP Clinical Pharmacist [place] [phone]

## 2020-01-30 NOTE — Telephone Encounter (Signed)
Pt called back. I tried to get her to schedule an appt and she wanted to check with Dr.Scottor Catie first to see when she needs an appt

## 2020-01-30 NOTE — Telephone Encounter (Signed)
Left detailed message for patient to schedule appt.

## 2020-01-31 NOTE — Telephone Encounter (Signed)
Can we do 12:00 on 02/09/20.  Let me know if this is earlier enough. If not, I can see about a work in somewhere else.

## 2020-01-31 NOTE — Telephone Encounter (Signed)
I think that is fine.

## 2020-01-31 NOTE — Telephone Encounter (Signed)
Last PCP appt in August recommended f/u in ~3 months (so late November). However, there are things I think she should discuss with Dr. Nicki Reaper (patient's decision to self discontinue letrozole) sooner, if possible. Last A1c was in April, so patient doesn't necessarily need to wait until late November for follow up on chronic conditions/lab work.   Dr. Nicki Reaper, when do you want to have her scheduled?

## 2020-01-31 NOTE — Telephone Encounter (Signed)
Patient aware and scheduled.

## 2020-02-09 ENCOUNTER — Other Ambulatory Visit: Payer: Self-pay

## 2020-02-09 ENCOUNTER — Ambulatory Visit (INDEPENDENT_AMBULATORY_CARE_PROVIDER_SITE_OTHER): Payer: Medicare Other | Admitting: Internal Medicine

## 2020-02-09 VITALS — BP 136/70 | HR 62 | Temp 98.4°F | Resp 16 | Ht 65.0 in | Wt 203.8 lb

## 2020-02-09 DIAGNOSIS — E7800 Pure hypercholesterolemia, unspecified: Secondary | ICD-10-CM

## 2020-02-09 DIAGNOSIS — E2839 Other primary ovarian failure: Secondary | ICD-10-CM

## 2020-02-09 DIAGNOSIS — I10 Essential (primary) hypertension: Secondary | ICD-10-CM

## 2020-02-09 DIAGNOSIS — I739 Peripheral vascular disease, unspecified: Secondary | ICD-10-CM

## 2020-02-09 DIAGNOSIS — R7989 Other specified abnormal findings of blood chemistry: Secondary | ICD-10-CM

## 2020-02-09 DIAGNOSIS — R945 Abnormal results of liver function studies: Secondary | ICD-10-CM

## 2020-02-09 DIAGNOSIS — C50912 Malignant neoplasm of unspecified site of left female breast: Secondary | ICD-10-CM

## 2020-02-09 DIAGNOSIS — F439 Reaction to severe stress, unspecified: Secondary | ICD-10-CM

## 2020-02-09 DIAGNOSIS — E1159 Type 2 diabetes mellitus with other circulatory complications: Secondary | ICD-10-CM

## 2020-02-09 DIAGNOSIS — J449 Chronic obstructive pulmonary disease, unspecified: Secondary | ICD-10-CM

## 2020-02-09 LAB — CBC WITH DIFFERENTIAL/PLATELET
Basophils Absolute: 0 10*3/uL (ref 0.0–0.1)
Basophils Relative: 0.7 % (ref 0.0–3.0)
Eosinophils Absolute: 0.1 10*3/uL (ref 0.0–0.7)
Eosinophils Relative: 1.8 % (ref 0.0–5.0)
HCT: 37.1 % (ref 36.0–46.0)
Hemoglobin: 12.7 g/dL (ref 12.0–15.0)
Lymphocytes Relative: 35.6 % (ref 12.0–46.0)
Lymphs Abs: 2.3 10*3/uL (ref 0.7–4.0)
MCHC: 34.2 g/dL (ref 30.0–36.0)
MCV: 92.7 fl (ref 78.0–100.0)
Monocytes Absolute: 0.5 10*3/uL (ref 0.1–1.0)
Monocytes Relative: 7.5 % (ref 3.0–12.0)
Neutro Abs: 3.6 10*3/uL (ref 1.4–7.7)
Neutrophils Relative %: 54.4 % (ref 43.0–77.0)
Platelets: 191 10*3/uL (ref 150.0–400.0)
RBC: 4 Mil/uL (ref 3.87–5.11)
RDW: 14.2 % (ref 11.5–15.5)
WBC: 6.6 10*3/uL (ref 4.0–10.5)

## 2020-02-09 LAB — LIPID PANEL
Cholesterol: 159 mg/dL (ref 0–200)
HDL: 41 mg/dL (ref >39.00)
NonHDL: 117.8
Total CHOL/HDL Ratio: 4
Triglycerides: 265 mg/dL — ABNORMAL HIGH (ref 0.0–149.0)
VLDL: 53 mg/dL — ABNORMAL HIGH (ref 0.0–40.0)

## 2020-02-09 LAB — HEPATIC FUNCTION PANEL
ALT: 25 U/L (ref 0–35)
AST: 19 U/L (ref 0–37)
Albumin: 4.5 g/dL (ref 3.5–5.2)
Alkaline Phosphatase: 61 U/L (ref 39–117)
Bilirubin, Direct: 0.1 mg/dL (ref 0.0–0.3)
Total Bilirubin: 0.9 mg/dL (ref 0.2–1.2)
Total Protein: 7.3 g/dL (ref 6.0–8.3)

## 2020-02-09 LAB — BASIC METABOLIC PANEL
BUN: 21 mg/dL (ref 6–23)
CO2: 25 mEq/L (ref 19–32)
Calcium: 9.7 mg/dL (ref 8.4–10.5)
Chloride: 101 mEq/L (ref 96–112)
Creatinine, Ser: 0.95 mg/dL (ref 0.40–1.20)
GFR: 57.27 mL/min — ABNORMAL LOW (ref >60.00)
Glucose, Bld: 101 mg/dL — ABNORMAL HIGH (ref 70–99)
Potassium: 4.1 mEq/L (ref 3.5–5.1)
Sodium: 137 mEq/L (ref 135–145)

## 2020-02-09 LAB — LDL CHOLESTEROL, DIRECT: Direct LDL: 94 mg/dL

## 2020-02-09 LAB — HEMOGLOBIN A1C: Hgb A1c MFr Bld: 7.3 % — ABNORMAL HIGH (ref 4.6–6.5)

## 2020-02-09 NOTE — Progress Notes (Signed)
[name], female   DOB: February 04, 1946, 74 y.o.   MRN: [id number]   Subjective:    [name], female    DOB: 1945-07-02, 74 y.o.   MRN: [id number]  HPI This visit occurred during the SARS-CoV-2 public health emergency.  Safety protocols were in place, including screening questions prior to the visit, additional usage of staff PPE, and extensive cleaning of exam room while observing appropriate contact time as indicated for disinfecting solutions.  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  She was being followed by Dr Bary Castilla for history of breast cancer.  He had been prescribing her femara.  She has not followed up recently and stopped taking femara - 12/2019.  She was concerned it may be contributing to increased moles.  Discussed the need for f/u regarding the femara.  Discussed need to determine duration.  She has been on femara for 5 years.  No chest pain or sob reported.  No abdominal pain.  Bowels moving.  Discussed the need for bone density.    Past Medical History:  Diagnosis Date  . Arthritis   . Breast cancer ([place])   . Breast cancer, left ([place]) 2016   LT LUMPECTOMY - TI, NO, MO - IDC, ER/PR pos, Her 2 neg, Rad tx's.   . Carotid artery occlusion   . Carotid artery occlusion   . 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 CHF (congestive heart failure) ([place])   . COPD (chronic obstructive pulmonary disease) ([place])   . Coronary artery disease   . Depression   . Diffuse cystic mastopathy 2013  . DVT (deep venous thrombosis) ([place])   . Dyspnea   . Elevated TSH   . Family history of adverse reaction to anesthesia    Pt stated that son had a seizure with a combination of anesthesia and pain medication."  . Fatty liver   . GERD (gastroesophageal reflux disease)   . Glaucoma   . High cholesterol   . History of chicken pox   . Hyperglycemia   . Hyperlipidemia   . Hypertension   . LVH (left ventricular hypertrophy)   .  PAC (premature atrial contraction)   . Palpitations   . Peripheral vascular disease ([place])   . Personal history of radiation therapy 2016   BREAST CA  . Pneumonia March 2014  . Skin cancer 2013  . Sleep apnea    wears CPAP set at 2.5  . Wears glasses    Past Surgical History:  Procedure Laterality Date  . BACK SURGERY  1986   ruptured disc  . BREAST BIOPSY Left 2008   NEG  . BREAST BIOPSY Left 08-20-14   POS  . BREAST BIOPSY Right 2007   NEG  . BREAST EXCISIONAL BIOPSY Left 1984   NEG  . 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 BREAST SURGERY Left 09/05/2014   T1a,N0; 5 mm  ER/PR positive. HER2 negative.  Wide excision with SLN biopsy.  MammoSite radiation  . CARPAL TUNNEL RELEASE    . CHOLECYSTECTOMY  2006  . COLONOSCOPY  2010   Dr. Jamal Collin  . COLONOSCOPY WITH PROPOFOL N/A 05/17/2017   Procedure: COLONOSCOPY WITH PROPOFOL;  Surgeon: Manya Silvas, MD;  Location: [facility] ENDOSCOPY;  Service: Endoscopy;  Laterality: N/A;  . DILATION AND CURETTAGE OF UTERUS    . ESOPHAGOGASTRODUODENOSCOPY (EGD) WITH PROPOFOL N/A 05/17/2017   Procedure: ESOPHAGOGASTRODUODENOSCOPY (EGD) WITH PROPOFOL;  Surgeon:  Manya Silvas, MD;  Location: [facility] ENDOSCOPY;  Service: Endoscopy;  Laterality: N/A;  . LAPAROTOMY FOR REMOVAL TUMOR LUMBAR PLEXES    . Leg stent  2011  . MOLE REMOVAL  2013   15 removed  . POSTERIOR CERVICAL LAMINECTOMY Left 10/15/2015   Procedure: Left Cervical four- five Hemilaminectomy/Remove mass;  Surgeon: Leeroy Cha, MD;  Location: [facility]URO ORS;  Service: Neurosurgery;  Laterality: Left;  Left C4-5 Hemilaminectomy/Remove mass   Family History  Problem Relation Age of Onset  . Cancer Father        Prostate  . Diabetes Father   . Cerebrovascular Accident Father   . Hypertension Mother   . AAA (abdominal aortic aneurysm) Mother   . Hyperlipidemia Other        Parent  . Miscarriages / Stillbirths Other        Parent  . Hypertension Other        parent  . Heart disease  Other        Parent  . Breast cancer Maternal Aunt 72   Social History   Socioeconomic History  . Marital status: Married    Spouse name: Not on file  . Number of children: 3  . Years of education: 81  . Highest education level: Not on file  Occupational History  . Occupation: Caregiver   Tobacco Use  . Smoking status: Never Smoker  . Smokeless tobacco: Never Used  Substance and Sexual Activity  . Alcohol use: No    Alcohol/week: 0.0 standard drinks  . Drug use: No  . Sexual activity: Never  Other Topics Concern  . Not on file  Social History Narrative   Regular exercise-mo   Caffeine Use-no   Social Determinants of Health   Financial Resource Strain: Medium Risk  . Difficulty of Paying Living Expenses: Somewhat hard  Food Insecurity:   . Worried About Charity fundraiser in the Last Year: Not on file  . Ran Out of Food in the Last Year: Not on file  Transportation Needs:   . Lack of Transportation (Medical): Not on file  . Lack of Transportation (Non-Medical): Not on file  Physical Activity:   . Days of Exercise per Week: Not on file  . Minutes of Exercise per Session: Not on file  Stress:   . Feeling of Stress : Not on file  Social Connections: Socially Integrated  . Frequency of Communication with Friends and Family: More than three times a week  . Frequency of Social Gatherings with Friends and Family: More than three times a week  . Attends Religious Services: More than 4 times per year  . Active Member of Clubs or Organizations: Yes  . Attends Archivist Meetings: More than 4 times per year  . Marital Status: Married    Outpatient Encounter Medications as of 02/09/2020  Medication Sig  . acetaminophen (TYLENOL) 650 MG suppository Place 650 mg rectally every 4 (four) hours as needed.  [name] aspirin (BAYER ASPIRIN) 325 MG tablet Take 325 mg by mouth daily.  . carvedilol (COREG) 12.5 MG tablet One tab po BID with meals (Patient taking differently: Take  12.5 mg by mouth 2 (two) times daily with a meal. One tab po BID with meals)  . Cholecalciferol (VITAMIN D-1000 MAX ST) 1000 units tablet Take 1,000 Units by mouth daily.   . hydrALAZINE (APRESOLINE) 10 MG tablet TAKE 1 TABLET(10 MG) BY MOUTH THREE TIMES DAILY (Patient taking differently: Take 10 mg by mouth 3 (  three) times daily. )  . losartan-hydrochlorothiazide (HYZAAR) 100-25 MG tablet Take 1 tablet by mouth daily.  . meclizine (ANTIVERT) 25 MG tablet Take 1 tablet (25 mg total) by mouth 3 (three) times daily as needed for dizziness or nausea.  [name] omeprazole (PRILOSEC) 40 MG capsule TAKE 1 CAPSULE(40 MG) BY MOUTH DAILY  . PROAIR HFA 108 (90 Base) MCG/ACT inhaler INL 2 INHALATIONS ITL Q 6 H PRF WHZ  . rosuvastatin (CRESTOR) 20 MG tablet TAKE 1 TABLET(20 MG) BY MOUTH DAILY  . vitamin E 400 UNIT capsule Take 400 Units by mouth daily.  . [DISCONTINUED] empagliflozin (JARDIANCE) 10 MG TABS tablet Take 10 mg by mouth daily before breakfast.  . letrozole (FEMARA) 2.5 MG tablet TAKE 1 TABLET BY MOUTH EVERY DAY (Patient not taking: Reported on 01/29/2020)   No facility-administered encounter medications on file as of 02/09/2020.    Review of Systems  Constitutional: Negative for appetite change and unexpected weight change.  HENT: Negative for congestion and sinus pressure.   Respiratory: Negative for cough, chest tightness and shortness of breath.   Cardiovascular: Negative for chest pain, palpitations and leg swelling.  Gastrointestinal: Negative for abdominal pain, nausea and vomiting.  Genitourinary: Negative for difficulty urinating and dysuria.  Musculoskeletal: Negative for joint swelling and myalgias.  Skin: Negative for color change and rash.  Neurological: Negative for dizziness, light-headedness and headaches.  Psychiatric/Behavioral: Negative for agitation and dysphoric mood.       Objective:    Physical Exam Vitals reviewed.  Constitutional:      General: She is not in acute  distress.    Appearance: Normal appearance.  HENT:     Head: Normocephalic and atraumatic.     Right Ear: External ear normal.     Left Ear: External ear normal.  Eyes:     General: No scleral icterus.       Right eye: No discharge.        Left eye: No discharge.     Conjunctiva/sclera: Conjunctivae normal.  Neck:     Thyroid: No thyromegaly.  Cardiovascular:     Rate and Rhythm: Normal rate and regular rhythm.  Pulmonary:     Effort: No respiratory distress.     Breath sounds: Normal breath sounds. No wheezing.  Abdominal:     General: Bowel sounds are normal.     Palpations: Abdomen is soft.     Tenderness: There is no abdominal tenderness.  Musculoskeletal:        General: No swelling or tenderness.     Cervical back: Neck supple. No tenderness.  Lymphadenopathy:     Cervical: No cervical adenopathy.  Skin:    Findings: No erythema or rash.  Neurological:     Mental Status: She is alert.  Psychiatric:        Mood and Affect: Mood normal.        Behavior: Behavior normal.     BP 136/70   Pulse 62   Temp 98.4 F (36.9 C) (Oral)   Resp 16   Ht $R'5\' 5"'SJ$  (1.651 m)   Wt 203 lb 12.8 oz (92.4 kg)   SpO2 98%   BMI 33.91 kg/m  Wt Readings from Last 3 Encounters:  02/09/20 203 lb 12.8 oz (92.4 kg)  01/02/20 202 lb (91.6 kg)  12/14/19 201 lb 15.1 oz (91.6 kg)     Lab Results  Component Value Date   WBC 6.6 02/09/2020   HGB 12.7 02/09/2020   HCT 37.1 02/09/2020  PLT 191.0 02/09/2020   GLUCOSE 101 (H) 02/09/2020   CHOL 159 02/09/2020   TRIG 265.0 (H) 02/09/2020   HDL 41.00 02/09/2020   LDLDIRECT 94.0 02/09/2020   LDLCALC 64 04/15/2017   ALT 25 02/09/2020   AST 19 02/09/2020   NA 137 02/09/2020   K 4.1 02/09/2020   CL 101 02/09/2020   CREATININE 0.95 02/09/2020   BUN 21 02/09/2020   CO2 25 02/09/2020   TSH 4.34 05/17/2019   INR 0.9 07/13/2012   HGBA1C 7.3 (H) 02/09/2020   MICROALBUR 1.2 05/17/2019       Assessment & Plan:   Problem List Items  Addressed This Visit    Stress    Overall she feels she is handling things relatively well.  Follow.       Peripheral vascular disease ([place])    Followed by [facility].  Evaluated 04/2018.  Recommended f/u in 2 years.  Continue aspirin and crestor.       Hypertension    Blood pressure as outlined.  Continue losartan/hctz, coreg and hydralazine.  Follow pressures.  Follow metabolic panel.        Hypercholesterolemia   Diabetes mellitus with cardiac complication (HCC)    Low carb diet and exercise.  Tolerating jardiance.  Follow met b and a1c.       COPD (chronic obstructive pulmonary disease) (HCC)    Has seen pulmonary.  Breathing stable.       Breast cancer [facility])    Previously followed by Dr Shirlyn Goltz.  No longer seeing (per pt).  Has been on femara.  Stopped in 12/2019.  Discussed the need for oncology f/u to determine duration.  Pt agreeable.  Needs bone density.       Relevant Orders   Ambulatory referral to Oncology   DG Bone Density   Abnormal liver function test    Fatty liver found on previous CT.  Diet and exercise and weight loss.  Follow.         Other Visit Diagnoses    Estrogen deficiency    -  Primary   Relevant Orders   DG Bone Density   Essential hypertension           Einar Pheasant, MD

## 2020-02-15 ENCOUNTER — Ambulatory Visit: Payer: Medicare Other

## 2020-02-15 DIAGNOSIS — E1159 Type 2 diabetes mellitus with other circulatory complications: Secondary | ICD-10-CM

## 2020-02-16 MED ORDER — EMPAGLIFLOZIN 25 MG PO TABS: 25.0000 mg | ORAL_TABLET | Freq: Every day | ORAL | 3 refills | Status: DC

## 2020-02-16 NOTE — Chronic Care Management (AMB) (Signed)
Chronic Care Management   Follow Up Note   02/16/2020 Name: Lindsey Bentley MRN: [id number] DOB: 05-15-1945  Referred by: Einar Pheasant, MD Reason for referral : Chronic Care Management (Medication Management)   Lindsey Bentley is a 74 y.o. year old female who is a primary care patient of Einar Pheasant, MD. The CCM team was consulted for assistance with chronic disease management and care coordination needs.     Care coordination completed today.   Review of patient status, including review of consultants reports, relevant laboratory and other test results, and collaboration with appropriate care team members and the patient's provider was performed as part of comprehensive patient evaluation and provision of chronic care management services.    SDOH (Social Determinants of Health) assessments performed: No See Care Plan activities for detailed interventions related to [facility])     Outpatient Encounter Medications as of 02/15/2020  Medication Sig Note  . acetaminophen (TYLENOL) 650 MG suppository Place 650 mg rectally every 4 (four) hours as needed.   [name] aspirin (BAYER ASPIRIN) 325 MG tablet Take 325 mg by mouth daily.   . carvedilol (COREG) 12.5 MG tablet One tab po BID with meals (Patient taking differently: Take 12.5 mg by mouth 2 (two) times daily with a meal. One tab po BID with meals)   . Cholecalciferol (VITAMIN D-1000 MAX ST) 1000 units tablet Take 1,000 Units by mouth daily.    . empagliflozin (JARDIANCE) 25 MG TABS tablet Take 1 tablet (25 mg total) by mouth daily before breakfast.   . hydrALAZINE (APRESOLINE) 10 MG tablet TAKE 1 TABLET(10 MG) BY MOUTH THREE TIMES DAILY (Patient taking differently: Take 10 mg by mouth 3 (three) times daily. )   . letrozole (FEMARA) 2.5 MG tablet TAKE 1 TABLET BY MOUTH EVERY DAY (Patient not taking: Reported on 01/29/2020) 12/14/2019: Recently discontinued  . losartan-hydrochlorothiazide (HYZAAR) 100-25 MG tablet Take 1 tablet by mouth daily.   .  meclizine (ANTIVERT) 25 MG tablet Take 1 tablet (25 mg total) by mouth 3 (three) times daily as needed for dizziness or nausea. 03/23/2019: PRN  . omeprazole (PRILOSEC) 40 MG capsule TAKE 1 CAPSULE(40 MG) BY MOUTH DAILY   . PROAIR HFA 108 (90 Base) MCG/ACT inhaler INL 2 INHALATIONS ITL Q 6 H PRF WHZ 03/23/2019: PRN  . rosuvastatin (CRESTOR) 20 MG tablet TAKE 1 TABLET(20 MG) BY MOUTH DAILY   . vitamin E 400 UNIT capsule Take 400 Units by mouth daily.   . [DISCONTINUED] empagliflozin (JARDIANCE) 10 MG TABS tablet Take 10 mg by mouth daily before breakfast.    No facility-administered encounter medications on file as of 02/15/2020.     Objective:   Goals Addressed              This Visit's Progress     Patient Stated   .  "I want to work on my blood sugars" (pt-stated)        [place] (see longtitudinal plan of care for additional care plan information)  Current Barriers:  . Social, community, and financial barriers:  o Needs new script for Jardiance 25 mg sent to patient assistance . Diabetes: uncontrolled; complicated by HTN, CAD, most recent A1c 7.3% . Current antihyperglycemic regimen: Jardiance 10 mg daily - per PCP, increase to 25 mg daily  o APPROVED for patient assistance for Jardiance through 05/10/20 o Hx intolerance to metformin, loose stools  . Cardiovascular risk reduction: o Current hypertensive regimen: hydralazine 10 mg TID, losartan/HCTZ 100/25 mg daily, carvedilol  6.25 mg BID;  o Current hyperlipidemia regimen: rosuvastatin 20 mg daily; last LDL NOT at goal <70. Holding on increasing statin at this time given prior intolerance concerns o Current antiplatelet regimen: ASA 325 mg daily; chart review does not reveal hx TIA/CVA, but patient does have PVD, followed by vascular  . Hx breast cancer: T1, N0, M0, ER/PR positive, HER 2 negative;hx letrozole  Pharmacist Clinical Goal(s):  [name] Over the next 90 days, patient will work with PharmD and primary care provider  to address optimized glycemic benefit  Interventions: . Called [facility] pharmacy, provided verbal script for Jardiance 25 mg script. Communicated to patient.   Patient Self Care Activities:  . Patient will check blood glucose BID , document, and provide at future appointments . Patient will take medications as prescribed . Patient will report any questions or concerns to provider   Please see past updates related to this goal by clicking on the "Past Updates" button in the selected goal          Plan:  - Will collaborate w/ Care Guide to schedule f/u call in ~8 weeks  Catie Darnelle Maffucci, PharmD, [place], [place] Pharmacist [place] [phone]

## 2020-02-16 NOTE — Patient Instructions (Signed)
Visit Information  Goals Addressed              This Visit's Progress     Patient Stated     "I want to work on my blood sugars" (pt-stated)        [name] (see longtitudinal plan of care for additional care plan information)  Current Barriers:   Social, community, and financial barriers:  o Needs new script for Jardiance 25 mg sent to patient assistance  Diabetes: uncontrolled; complicated by HTN, CAD, most recent A1c 7.3%  Current antihyperglycemic regimen: Jardiance 10 mg daily - per PCP, increase to 25 mg daily  o APPROVED for patient assistance for Jardiance through 05/10/20 o Hx intolerance to metformin, loose stools   Cardiovascular risk reduction: o Current hypertensive regimen: hydralazine 10 mg TID, losartan/HCTZ 100/25 mg daily, carvedilol 6.25 mg BID;  o Current hyperlipidemia regimen: rosuvastatin 20 mg daily; last LDL NOT at goal <70. Holding on increasing statin at this time given prior intolerance concerns o Current antiplatelet regimen: ASA 325 mg daily; chart review does not reveal hx TIA/CVA, but patient does have PVD, followed by vascular   Hx breast cancer: T1, N0, M0, ER/PR positive, HER 2 negative;hx letrozole  Pharmacist Clinical Goal(s):   Over the next 90 days, patient will work with PharmD and primary care provider to address optimized glycemic benefit  Interventions:  [facility], provided verbal script for Jardiance 25 mg script. Communicated to patient.   Patient Self Care Activities:   Patient will check blood glucose BID , document, and provide at future appointments  Patient will take medications as prescribed  Patient will report any questions or concerns to provider   Please see past updates related to this goal by clicking on the "Past Updates" button in the selected goal         The patient verbalized understanding of instructions provided today and declined a print copy of patient instruction materials.      Plan:  - Will collaborate w/ Care Guide to schedule f/u call in ~8 weeks  Catie Darnelle Maffucci, PharmD, [place], CPP Clinical Pharmacist [place] [phone]

## 2020-02-17 ENCOUNTER — Encounter: Payer: Self-pay | Admitting: Internal Medicine

## 2020-02-17 NOTE — Assessment & Plan Note (Signed)
Has seen pulmonary.  Breathing stable.   

## 2020-02-17 NOTE — Assessment & Plan Note (Signed)
Followed by [facility].  Evaluated 04/2018.  Recommended f/u in 2 years.  Continue aspirin and crestor.

## 2020-02-17 NOTE — Assessment & Plan Note (Signed)
Overall she feels she is handling things relatively well.  Follow.  

## 2020-02-17 NOTE — Assessment & Plan Note (Signed)
Low carb diet and exercise.  Tolerating jardiance.  Follow met b and a1c.

## 2020-02-17 NOTE — Assessment & Plan Note (Addendum)
Previously followed by Dr Shirlyn Goltz.  No longer seeing (per pt).  Has been on femara.  Stopped in 12/2019.  Discussed the need for oncology f/u to determine duration.  Pt agreeable.  Needs bone density.

## 2020-02-17 NOTE — Assessment & Plan Note (Addendum)
Blood pressure as outlined.  Continue losartan/hctz, coreg and hydralazine.  Follow pressures.  Follow metabolic panel.  

## 2020-02-17 NOTE — Assessment & Plan Note (Signed)
Fatty liver found on previous CT.  Diet and exercise and weight loss.  Follow.

## 2020-02-20 NOTE — Progress Notes (Signed)
Pt has been scheduled for 04/12/2020

## 2020-02-24 ENCOUNTER — Other Ambulatory Visit: Payer: Self-pay | Admitting: Internal Medicine

## 2020-02-26 ENCOUNTER — Inpatient Hospital Stay: Payer: Medicare Other | Attending: Internal Medicine | Admitting: Internal Medicine

## 2020-02-26 ENCOUNTER — Other Ambulatory Visit: Payer: Self-pay

## 2020-02-26 ENCOUNTER — Encounter: Payer: Self-pay | Admitting: Internal Medicine

## 2020-02-26 ENCOUNTER — Inpatient Hospital Stay: Payer: Medicare Other

## 2020-02-26 DIAGNOSIS — K760 Fatty (change of) liver, not elsewhere classified: Secondary | ICD-10-CM | POA: Insufficient documentation

## 2020-02-26 DIAGNOSIS — Z85828 Personal history of other malignant neoplasm of skin: Secondary | ICD-10-CM | POA: Insufficient documentation

## 2020-02-26 DIAGNOSIS — C50512 Malignant neoplasm of lower-outer quadrant of left female breast: Secondary | ICD-10-CM | POA: Diagnosis not present

## 2020-02-26 DIAGNOSIS — J449 Chronic obstructive pulmonary disease, unspecified: Secondary | ICD-10-CM | POA: Insufficient documentation

## 2020-02-26 DIAGNOSIS — Z7982 Long term (current) use of aspirin: Secondary | ICD-10-CM | POA: Diagnosis not present

## 2020-02-26 DIAGNOSIS — K219 Gastro-esophageal reflux disease without esophagitis: Secondary | ICD-10-CM | POA: Insufficient documentation

## 2020-02-26 DIAGNOSIS — I110 Hypertensive heart disease with heart failure: Secondary | ICD-10-CM | POA: Insufficient documentation

## 2020-02-26 DIAGNOSIS — Z79811 Long term (current) use of aromatase inhibitors: Secondary | ICD-10-CM | POA: Diagnosis not present

## 2020-02-26 DIAGNOSIS — I2510 Atherosclerotic heart disease of native coronary artery without angina pectoris: Secondary | ICD-10-CM | POA: Insufficient documentation

## 2020-02-26 DIAGNOSIS — M1990 Unspecified osteoarthritis, unspecified site: Secondary | ICD-10-CM | POA: Insufficient documentation

## 2020-02-26 DIAGNOSIS — Z86718 Personal history of other venous thrombosis and embolism: Secondary | ICD-10-CM | POA: Diagnosis not present

## 2020-02-26 DIAGNOSIS — I739 Peripheral vascular disease, unspecified: Secondary | ICD-10-CM | POA: Insufficient documentation

## 2020-02-26 DIAGNOSIS — E7800 Pure hypercholesterolemia, unspecified: Secondary | ICD-10-CM | POA: Diagnosis not present

## 2020-02-26 DIAGNOSIS — Z170 Estrogen receptor positive status [ER+]: Secondary | ICD-10-CM | POA: Diagnosis not present

## 2020-02-26 DIAGNOSIS — I509 Heart failure, unspecified: Secondary | ICD-10-CM | POA: Diagnosis not present

## 2020-02-26 DIAGNOSIS — Z79899 Other long term (current) drug therapy: Secondary | ICD-10-CM | POA: Insufficient documentation

## 2020-02-26 NOTE — Progress Notes (Signed)
[facility] [place] NOTE  Patient Care Team: Einar Pheasant, MD as PCP - General (Internal Medicine) Christene Lye, MD (General Surgery) Einar Pheasant, MD (Internal Medicine) [name], RPH-CPP as Pharmacist (Pharmacist)  CHIEF COMPLAINTS/PURPOSE OF CONSULTATION: Breast cancer  #  Oncology History Overview Note  . Lymph node #1, sentinel  B. Lymph node #2, sentinel  C. Breast, left, wide excision   CLINICAL HISTORY:  None provided   PRE-OPERATIVE DIAGNOSIS:  Left breast cancer   POST-OPERATIVE DIAGNOSIS:  Same/ left breast wide excision w/ needle loc, sentinel node biopsy,  cavity evaluation .    DIAGNOSIS:  A. SENTINEL LYMPH NODE #1; EXCISION:  - NO TUMOR SEEN IN ONE SENTINEL LYMPH NODE (0/1).   B. SENTINEL LYMPH NODE #2; EXCISION:  - NO TUMOR SEEN IN ONE SENTINEL LYMPH NODE (0/1).   C. 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  - DUCTAL CARCINOMA IN SITU, NUCLEAR GRADE 3, PATTERNS WITH LOBULAR  CYTOLOGY, MICROPAPILLARY AND CRIBRIFORM.  - MICROCALCIFICATIONS ASSOCIATED WITH IN SITU CARCINOMA.  - BIOPSY SITE CHANGES, MARKER CLIP PRESENT.  - THE MARGINS OF EXCISION ARE NEGATIVE (INVASIVE CARCINOMA 5 MM FROM  POSTERIOR; IN SITU CARCINOMA <1 MM FROM CAUDAL).  - SEE SUMMARY BELOW.    INVASIVE CARCINOMA OF THE BREAST: Complete Excision (Less Than Total  Mastectomy, Including Specimens Designated Biopsy, Lumpectomy,  Quadrantectomy, and Partial Mastectomy With or Without Axillary  Contents) and Mastectomy (Total, Modified Radical, Radical With or  Without Axillary Contents)Radical, Radical With or Without Axillary  Contents)  Specimens InvolvedC: Breast, left, wide excision   Breast Invasive Carcinoma Cancer Case Summary  SPECIMEN  Procedure:     Excision with wire-guided localization  Lymph Node Sampling:     Sentinel lymph node(s)  Specimen Laterality:     Left  TUMOR   Presence of Invasive Carcinoma:    Histologic Type of Invasive  Carcinoma:  Invasive ductal carcinoma (no special type or not otherwise specified)  Histologic Grade: Nottingham Histologic Score  Glandular (Acinar) / Tubular Differentiation:  Score 3: < 10% of tumor area forming glandular / tubular structures  Nuclear Pleomorphism:  Score 2: Cells larger than normal with open vesicular nuclei, visible  nucleoli, and moderate variability in both size and shape  Mitotic Rate  Score 1 (<=3 mitoses per mm2)  Overall Grade: Grade 2: scores of 6 or 7  Ductal Carcinoma In Situ (DCIS):   DCIS is present  EXTENT  Tumor Size / Focality:   Tumor Size: Size of Largest Invasive Carcinoma:  Greatest dimension of largest focus of invasion > 1 mm  Greatest Dimension (mm): 0.5cm  MARGINS  Invasive Carcinoma: Margins uninvolved by invasive carcinoma  Distance from Closest Margin  (mm):     Distance (specify in mm)  34m  Ductal Carcinoma In Situ (DCIS):   Margins uninvolved by DCIS (DCIS  present in specimen)  Distance of DCIS from Closest Margin (mm):  Distance is < 1 mm  LYMPH NODES  Status of Lymph Nodes:   Total Number of Lymph Nodes Examined:   Specify  2  Micro / Macro Metastases:     Not identified  0  Number of Lymph Nodes with Isolated Tumor Cells (<= 0.2 mm and <= 200  cells):   None  Number of Sentinel Lymph Nodes Examined:     Specify  2  ACCESSORY FINDINGS  Lymph-Vascular Invasion: Not Identified  STAGE (pTNM)  TNM Descriptors:  N/A  Primary Tumor (Invasive Carcinoma) (pT):  pT1a:  Tumor > 1 mm but <= 5 mm in greatest dimension  Regional Lymph Nodes (pN) (choose a category based on lymph nodes  received with the specimen;  immunohistochemistry and / or molecular  studies are not required)  Category (pN): pN0: No regional lymph node metastasis identified  histologically  Distant Metastasis (pM): N/A   # SURVIVORSHIP:   # GENETICS:   DIAGNOSIS: LEFT BREAST CANCER  ER/PR-positive; 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  female female with history of breast cancer has been referred to [place] for further evaluation.  In 2016 patient states she was found to have an abnormal screening mammogram which led to diagnostic mammogram/ultrasound/followed by biopsy. Subsequently patient underwent a lumpectomy with Dr. Jamal Collin. Pathology summarized above. Patient was started on letrozole. Patient took letrozole for 5 and half years. Last mammogram was in July 2021-no concerns for any recurrence.  Patient has been referred to [place] to discuss the duration of letrozole. Patient does not follow-up with Dr. Alinda Deem he retired.  Review of Systems  Constitutional: Negative for chills, diaphoresis, fever, malaise/fatigue and weight loss.  HENT: Negative for nosebleeds and sore throat.   Eyes: Negative for double vision.  Respiratory: Negative for cough, hemoptysis, sputum production, shortness of breath and wheezing.   Cardiovascular: Negative for chest pain, palpitations, orthopnea and leg swelling.  Gastrointestinal: Negative for abdominal pain, blood in stool, constipation, diarrhea, heartburn, melena, nausea and vomiting.  Genitourinary: Negative for dysuria, frequency and urgency.  Musculoskeletal: Positive for back pain and joint pain.  Skin: Negative.  Negative for itching and rash.  Neurological: Negative for dizziness, tingling, focal weakness, weakness and headaches.  Endo/Heme/Allergies: Does not bruise/bleed easily.  Psychiatric/Behavioral: Negative for depression. The patient is not nervous/anxious and does not have insomnia.       MEDICAL HISTORY:  Past Medical History:  Diagnosis Date  . Arthritis   . Breast cancer ([place])   . Breast cancer, left ([place]) 2016   LT LUMPECTOMY - TI, NO, MO - IDC, ER/PR pos, Her 2 neg, Rad tx's.   . Carotid artery occlusion   . Carotid artery occlusion   . 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 CHF (congestive heart failure) ([place])   . COPD (chronic obstructive pulmonary disease) ([place])   . Coronary artery disease   . Depression   . Diffuse cystic mastopathy 2013  . DVT (deep venous thrombosis) ([place])   . Dyspnea   . Elevated TSH   . Family history of adverse reaction to anesthesia    Pt stated that son had a seizure with a combination of anesthesia and pain medication."  . Fatty liver   . GERD (gastroesophageal reflux disease)   . Glaucoma   . High cholesterol   . History of chicken pox   . Hyperglycemia   . Hyperlipidemia   . Hypertension   . LVH (left ventricular hypertrophy)   . PAC (premature atrial contraction)   . Palpitations   . Peripheral vascular disease ([place])   . Personal history of radiation therapy 2016  BREAST CA  . Pneumonia March 2014  . Skin cancer 2013  . Sleep apnea    wears CPAP set at 2.5  . Wears glasses     SURGICAL HISTORY: Past Surgical History:  Procedure Laterality Date  . BACK SURGERY  1986   ruptured disc  . BREAST BIOPSY Left 2008   NEG  . BREAST BIOPSY Left 08-20-14   POS  . BREAST BIOPSY Right 2007   NEG  . BREAST EXCISIONAL BIOPSY Left 1984   NEG  . 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 BREAST SURGERY Left 09/05/2014   T1a,N0; 5 mm  ER/PR positive. HER2 negative.  Wide excision with SLN biopsy.  MammoSite radiation  . CARPAL TUNNEL RELEASE    . CHOLECYSTECTOMY  2006  . COLONOSCOPY  2010   Dr. Jamal Collin  . COLONOSCOPY WITH PROPOFOL N/A 05/17/2017   Procedure: COLONOSCOPY WITH PROPOFOL;  Surgeon: Manya Silvas, MD;  Location: [facility] ENDOSCOPY;  Service: Endoscopy;  Laterality: N/A;  .  DILATION AND CURETTAGE OF UTERUS    . ESOPHAGOGASTRODUODENOSCOPY (EGD) WITH PROPOFOL N/A 05/17/2017   Procedure: ESOPHAGOGASTRODUODENOSCOPY (EGD) WITH PROPOFOL;  Surgeon: Manya Silvas, MD;  Location: [facility] ENDOSCOPY;  Service: Endoscopy;  Laterality: N/A;  . LAPAROTOMY FOR REMOVAL TUMOR LUMBAR PLEXES    . Leg stent  2011  . MOLE REMOVAL  2013   15 removed  . POSTERIOR CERVICAL LAMINECTOMY Left 10/15/2015   Procedure: Left Cervical four- five Hemilaminectomy/Remove mass;  Surgeon: Leeroy Cha, MD;  Location: [facility] NEURO ORS;  Service: Neurosurgery;  Laterality: Left;  Left C4-5 Hemilaminectomy/Remove mass    SOCIAL HISTORY: Social History   Socioeconomic History  . Marital status: Married    Spouse name: Not on file  . Number of children: 3  . Years of education: 59  . Highest education level: Not on file  Occupational History  . Occupation: Caregiver   Tobacco Use  . Smoking status: Never Smoker  . Smokeless tobacco: Never Used  Substance and Sexual Activity  . Alcohol use: No    Alcohol/week: 0.0 standard drinks  . Drug use: No  . Sexual activity: Never  Other Topics Concern  . Not on file  Social History Narrative   Regular exercise-mo   Caffeine Use-no   Social Determinants of Health   Financial Resource Strain: Medium Risk  . Difficulty of Paying Living Expenses: Somewhat hard  Food Insecurity:   . Worried About Charity fundraiser in the Last Year: Not on file  . Ran Out of Food in the Last Year: Not on file  Transportation Needs:   . Lack of Transportation (Medical): Not on file  . Lack of Transportation (Non-Medical): Not on file  Physical Activity:   . Days of Exercise per Week: Not on file  . Minutes of Exercise per Session: Not on file  Stress:   . Feeling of Stress : Not on file  Social Connections: Socially Integrated  . Frequency of Communication with Friends and Family: More than three times a week  . Frequency of Social Gatherings with Friends and  Family: More than three times a week  . Attends Religious Services: More than 4 times per year  . Active Member of Clubs or Organizations: Yes  . Attends Archivist Meetings: More than 4 times per year  . Marital Status: Married  Human resources officer Violence:   . Fear of Current or Ex-Partner: Not on file  . Emotionally Abused: Not on file  .  Physically Abused: Not on file  . Sexually Abused: Not on file    FAMILY HISTORY: Family History  Problem Relation Age of Onset  . Cancer Father        Prostate  . Diabetes Father   . Cerebrovascular Accident Father   . Hypertension Mother   . AAA (abdominal aortic aneurysm) Mother   . Hyperlipidemia Other        Parent  . Miscarriages / Stillbirths Other        Parent  . Hypertension Other        parent  . Heart disease Other        Parent  . Breast cancer Maternal Aunt 72    ALLERGIES:  is allergic to codeine, prednisone, statins, and tape.  MEDICATIONS:  Current Outpatient Medications  Medication Sig Dispense Refill  . acetaminophen (TYLENOL) 650 MG suppository Place 650 mg rectally every 4 (four) hours as needed.    [name] aspirin (BAYER ASPIRIN) 325 MG tablet Take 325 mg by mouth daily.    . carvedilol (COREG) 12.5 MG tablet One tab po BID with meals (Patient taking differently: Take 12.5 mg by mouth 2 (two) times daily with a meal. One tab po BID with meals) 180 tablet 1  . Cholecalciferol (VITAMIN D-1000 MAX ST) 1000 units tablet Take 1,000 Units by mouth daily.     . empagliflozin (JARDIANCE) 25 MG TABS tablet Take 1 tablet (25 mg total) by mouth daily before breakfast. 90 tablet 3  . hydrALAZINE (APRESOLINE) 10 MG tablet TAKE 1 TABLET(10 MG) BY MOUTH THREE TIMES DAILY (Patient taking differently: Take 10 mg by mouth 3 (three) times daily. ) 270 tablet 1  . losartan-hydrochlorothiazide (HYZAAR) 100-25 MG tablet Take 1 tablet by mouth daily. 90 tablet 1  . meclizine (ANTIVERT) 25 MG tablet Take 1 tablet (25 mg total) by mouth  3 (three) times daily as needed for dizziness or nausea. 20 tablet 0  . omeprazole (PRILOSEC) 40 MG capsule TAKE 1 CAPSULE(40 MG) BY MOUTH DAILY 90 capsule 0  . PROAIR HFA 108 (90 Base) MCG/ACT inhaler INL 2 INHALATIONS ITL Q 6 H PRF WHZ  1  . rosuvastatin (CRESTOR) 20 MG tablet TAKE 1 TABLET(20 MG) BY MOUTH DAILY 90 tablet 1  . vitamin E 400 UNIT capsule Take 400 Units by mouth daily.    [name] letrozole (FEMARA) 2.5 MG tablet TAKE 1 TABLET BY MOUTH EVERY DAY (Patient not taking: Reported on 01/29/2020) 90 tablet 4   No current facility-administered medications for this visit.      [name]  PHYSICAL EXAMINATION: ECOG PERFORMANCE STATUS: 0 - Asymptomatic  Vitals:   02/26/20 1414  BP: (!) 156/57  Pulse: (!) 57  Resp: 16  Temp: 97.6 F (36.4 C)  SpO2: 98%   Filed Weights   02/26/20 1414  Weight: 202 lb (91.6 kg)    Physical Exam HENT:     Head: Normocephalic and atraumatic.     Mouth/Throat:     Pharynx: No oropharyngeal exudate.  Eyes:     Pupils: Pupils are equal, round, and reactive to light.  Cardiovascular:     Rate and Rhythm: Normal rate and regular rhythm.  Pulmonary:     Effort: Pulmonary effort is normal. No respiratory distress.     Breath sounds: Normal breath sounds. No wheezing.  Abdominal:     General: Bowel sounds are normal. There is no distension.     Palpations: Abdomen is soft. There is no mass.  Tenderness: There is no abdominal tenderness. There is no guarding or rebound.  Musculoskeletal:        General: No tenderness. Normal range of motion.     Cervical back: Normal range of motion and neck supple.  Skin:    General: Skin is warm.  Neurological:     Mental Status: She is alert and oriented to person, place, and time.  Psychiatric:        Mood and Affect: Affect normal.      LABORATORY DATA:  I have reviewed the data as listed Lab Results  Component Value Date   WBC 6.6 02/09/2020   HGB 12.7 02/09/2020   HCT 37.1 02/09/2020   MCV 92.7  02/09/2020   PLT 191.0 02/09/2020   Recent Labs    05/17/19 0806 05/17/19 0806 09/06/19 0807 12/14/19 1048 02/09/20 1303  NA 138   < > 135 136 137  K 3.9   < > 3.5 3.6 4.1  CL 105   < > 101 103 101  CO2 24   < > _0 GLUCOSE 150*   < > 138* 118* 101*  BUN 27*   < > 32* 19 21  CREATININE 1.03   < > 0.98 1.12* 0.95  CALCIUM 9.6   < > 9.5 9.3 9.7  GFRNONAA  --   --   --  48*  --   GFRAA  --   --   --  56*  --   PROT 7.2  --  7.2  --  7.3  ALBUMIN 4.2  --  4.3  --  4.5  AST 14  --  15  --  19  ALT 23  --  21  --  25  ALKPHOS 59  --  61  --  61  BILITOT 0.4  --  0.6  --  0.9  BILIDIR 0.1  --  0.1  --  0.1   < > = values in this interval not displayed.    RADIOGRAPHIC STUDIES: I have personally reviewed the radiological images as listed and agreed with the findings in the report. No results found.  ASSESSMENT & PLAN:   Carcinoma of lower-outer quadrant of left breast in female, estrogen receptor positive ([place]) #Stage I ER/PR positive HER-2 negative breast cancer [2016]; 5 mm-low risk; I again reviewed the pathology and stage with the patient in detail. Patient is currently status post adjuvant MammoSite radiation. Followed by antihormone therapy/letrozole for 5 and half years. Patient tolerated treatment fairly well without any major side effects.  #Given the low risk features of her early stage breast cancer-I think she would not benefit from any extended antihormone therapy. I think is reasonable to stop endocrine therapy at this time.   #Screen for osteoporosis: Discussed the potential risk factors for osteoporosis including age postmenopausal status; race; and also use of endocrine therapy. Patient awaiting bone density with PCP. Recommend she continues calcium plus vitamin D.   Thank you Dr.SCott for allowing me to participate in the care of your pleasant patient. Please do not hesitate to contact me with questions or concerns in the interim. Patient will follow up with  PCP; will need breast exam /surveillance mammograms on annual basis. Otherwise patient will follow up with [place] as needed. Patient in agreement with the plan.  # DISPOSITION: # follow up as needed-Dr.B    All questions were answered. The patient/family knows to call the clinic with any problems, questions or concerns.  Cammie Sickle, MD 02/26/2020 3:09 PM

## 2020-02-26 NOTE — Assessment & Plan Note (Addendum)
#  Stage I ER/PR positive HER-2 negative breast cancer [2016]; 5 mm-low risk; I again reviewed the pathology and stage with the patient in detail. Patient is currently status post adjuvant MammoSite radiation. Followed by antihormone therapy/letrozole for 5 and half years. Patient tolerated treatment fairly well without any major side effects.  #Given the low risk features of her early stage breast cancer-I think she would not benefit from any extended antihormone therapy. I think is reasonable to stop endocrine therapy at this time.   #Screen for osteoporosis: Discussed the potential risk factors for osteoporosis including age postmenopausal status; race; and also use of endocrine therapy. Patient awaiting bone density with PCP. Recommend she continues calcium plus vitamin D.   Thank you Dr.SCott for allowing me to participate in the care of your pleasant patient. Please do not hesitate to contact me with questions or concerns in the interim. Patient will follow up with PCP; will need breast exam /surveillance mammograms on annual basis. Otherwise patient will follow up with [place] as needed. Patient in agreement with the plan.  # DISPOSITION: # follow up as needed-Dr.B

## 2020-03-04 ENCOUNTER — Telehealth: Payer: Self-pay | Admitting: Internal Medicine

## 2020-03-06 MED ORDER — CARVEDILOL 12.5 MG PO TABS: ORAL_TABLET | ORAL | 1 refills | Status: DC

## 2020-03-06 MED ORDER — LOSARTAN POTASSIUM-HCTZ 100-25 MG PO TABS: 1.0000 | ORAL_TABLET | Freq: Every day | ORAL | 1 refills | Status: DC

## 2020-03-06 NOTE — Addendum Note (Signed)
Addended by: Elpidio Galea T on: 03/06/2020 10:38 AM   Modules accepted: Orders

## 2020-03-06 NOTE — Telephone Encounter (Signed)
Pt states that these two prescriptions are currently not at the pharmacy-please re send

## 2020-03-11 ENCOUNTER — Ambulatory Visit (INDEPENDENT_AMBULATORY_CARE_PROVIDER_SITE_OTHER): Payer: Medicare Other | Admitting: Internal Medicine

## 2020-03-11 DIAGNOSIS — Z9989 Dependence on other enabling machines and devices: Secondary | ICD-10-CM | POA: Diagnosis not present

## 2020-03-11 DIAGNOSIS — G4733 Obstructive sleep apnea (adult) (pediatric): Secondary | ICD-10-CM | POA: Diagnosis not present

## 2020-03-11 DIAGNOSIS — K219 Gastro-esophageal reflux disease without esophagitis: Secondary | ICD-10-CM

## 2020-03-11 DIAGNOSIS — Z7189 Other specified counseling: Secondary | ICD-10-CM

## 2020-03-11 NOTE — Progress Notes (Signed)
[facility] [place], [place] [address]  Pulmonary Sleep Medicine   Office Visit Note  Patient Name: Lindsey Bentley DOB: 1946/03/07 MRN [id number]    Chief Complaint: Obstructive Sleep Apnea visit  Brief History:  Lindsey Bentley is seen today for follow up The patient has a 6 year history of sleep apnea. Patient is using PAP nightly.  The patient feels more rested after sleeping with PAP. She usually puts the mask on around 10 p.m. and gets up between 6 and 7 a.m.  The patient reports benefiting from PAP use. Reported sleepiness is  improved and the Epworth Sleepiness Score is 2 out of 24. The patient rarely takes naps. The patient complains of the following: no complaints. She uses cloth mask liner  The compliance download shows excellent compliance with an average use time of 8.6 hours. The AHI is 0.9  The patient  Has rare RLS symptoms in bed if she does not go to sleep right away.  ROS  General: (-) fever, (-) chills, (-) night sweat Nose and Sinuses: (-) nasal stuffiness or itchiness, (-) postnasal drip, (-) nosebleeds, (-) sinus trouble. Mouth and Throat: (-) sore throat, (-) hoarseness. Neck: (-) swollen glands, (-) enlarged thyroid, (-) neck pain. Respiratory: - cough, - shortness of breath, - wheezing. Neurologic: - numbness, - tingling. Psychiatric: - anxiety, - depression   Current Medication: Outpatient Encounter Medications as of 03/11/2020  Medication Sig Note  . aspirin (BAYER ASPIRIN) 325 MG tablet Take 325 mg by mouth daily.   . carvedilol (COREG) 12.5 MG tablet TAKE 1 TABLET(12.5 MG) BY MOUTH TWICE DAILY WITH MEALS   . Cholecalciferol (VITAMIN D-1000 MAX ST) 1000 units tablet Take 1,000 Units by mouth daily.    . empagliflozin (JARDIANCE) 25 MG TABS tablet Take 1 tablet (25 mg total) by mouth daily before breakfast.   . hydrALAZINE (APRESOLINE) 10 MG tablet TAKE 1 TABLET(10 MG) BY MOUTH THREE TIMES DAILY (Patient taking differently: Take 10 mg  by mouth 3 (three) times daily. )   . losartan-hydrochlorothiazide (HYZAAR) 100-25 MG tablet Take 1 tablet by mouth daily.   . meclizine (ANTIVERT) 25 MG tablet Take 1 tablet (25 mg total) by mouth 3 (three) times daily as needed for dizziness or nausea. 03/23/2019: PRN  . omeprazole (PRILOSEC) 40 MG capsule TAKE 1 CAPSULE(40 MG) BY MOUTH DAILY   . PROAIR HFA 108 (90 Base) MCG/ACT inhaler INL 2 INHALATIONS ITL Q 6 H PRF WHZ 03/23/2019: PRN  . rosuvastatin (CRESTOR) 20 MG tablet TAKE 1 TABLET(20 MG) BY MOUTH DAILY   . vitamin E 400 UNIT capsule Take 400 Units by mouth daily.   . [DISCONTINUED] acetaminophen (TYLENOL) 650 MG suppository Place 650 mg rectally every 4 (four) hours as needed.   . [DISCONTINUED] letrozole (Glenvar) 2.5 MG tablet TAKE 1 TABLET BY MOUTH EVERY DAY 12/14/2019: Recently discontinued   No facility-administered encounter medications on file as of 03/11/2020.    Surgical History: Past Surgical History:  Procedure Laterality Date  . BACK SURGERY  1986   ruptured disc  . BREAST BIOPSY Left 2008   NEG  . BREAST BIOPSY Left 08-20-14   POS  . BREAST BIOPSY Right 2007   NEG  . BREAST EXCISIONAL BIOPSY Left 1984   NEG  . 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 BREAST SURGERY Left 09/05/2014   T1a,N0; 5 mm  ER/PR positive. HER2 negative.  Wide excision with SLN biopsy.  MammoSite radiation  . CARPAL TUNNEL  RELEASE    . CHOLECYSTECTOMY  2006  . COLONOSCOPY  2010   Dr. Jamal Collin  . COLONOSCOPY WITH PROPOFOL N/A 05/17/2017   Procedure: COLONOSCOPY WITH PROPOFOL;  Surgeon: Manya Silvas, MD;  [facility] ENDOSCOPY;  Service: Endoscopy;  Laterality: N/A;  . DILATION AND CURETTAGE OF UTERUS    . ESOPHAGOGASTRODUODENOSCOPY (EGD) WITH PROPOFOL N/A 05/17/2017   Procedure: ESOPHAGOGASTRODUODENOSCOPY (EGD) WITH PROPOFOL;  Surgeon: Manya Silvas, MD;  Location: [facility] ENDOSCOPY;  Service: Endoscopy;  Laterality: N/A;  . LAPAROTOMY FOR REMOVAL TUMOR LUMBAR PLEXES    . Leg stent   2011  . MOLE REMOVAL  2013   15 removed  . POSTERIOR CERVICAL LAMINECTOMY Left 10/15/2015   Procedure: Left Cervical four- five Hemilaminectomy/Remove mass;  Surgeon: Leeroy Cha, MD;  Location: [facility] NEURO ORS;  Service: Neurosurgery;  Laterality: Left;  Left C4-5 Hemilaminectomy/Remove mass    Medical History: Past Medical History:  Diagnosis Date  . Arthritis   . Breast cancer ([place])   . Breast cancer, left ([place]) 2016   LT LUMPECTOMY - TI, NO, MO - IDC, ER/PR pos, Her 2 neg, Rad tx's.   . Carotid artery occlusion   . Carotid artery occlusion   . 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 CHF (congestive heart failure) ([place])   . COPD (chronic obstructive pulmonary disease) ([place])   . Coronary artery disease   . Depression   . Diffuse cystic mastopathy 2013  . DVT (deep venous thrombosis) ([place])   . Dyspnea   . Elevated TSH   . Family history of adverse reaction to anesthesia    Pt stated that son had a seizure with a combination of anesthesia and pain medication."  . Fatty liver   . GERD (gastroesophageal reflux disease)   . Glaucoma   . High cholesterol   . History of chicken pox   . Hyperglycemia   . Hyperlipidemia   . Hypertension   . LVH (left ventricular hypertrophy)   . PAC (premature atrial contraction)   . Palpitations   . Peripheral vascular disease ([place])   . Personal history of radiation therapy 2016   BREAST CA  . Pneumonia March 2014  . Skin cancer 2013  . Sleep apnea    wears CPAP set at 2.5  . Wears glasses     Family History: Non contributory to the present illness  Social History: Social History   Socioeconomic History  . Marital status: Married    Spouse name: Not on file  . Number of children: 3  . Years of education: 7  . Highest education level: Not on file  Occupational History  . Occupation: Caregiver   Tobacco Use  . Smoking status: Never Smoker  . Smokeless tobacco: Never Used  Substance and Sexual Activity   . Alcohol use: No    Alcohol/week: 0.0 standard drinks  . Drug use: No  . Sexual activity: Never  Other Topics Concern  . Not on file  Social History Narrative   Regular exercise-mo   Caffeine Use-no   Social Determinants of Health   Financial Resource Strain: Medium Risk  . Difficulty of Paying Living Expenses: Somewhat hard  Food Insecurity:   . Worried About Charity fundraiser in the Last Year: Not on file  . Ran Out of Food in the Last Year: Not on file  Transportation Needs:   . Lack of Transportation (Medical): Not on file  . Lack of Transportation (Non-Medical): Not  on file  Physical Activity:   . Days of Exercise per Week: Not on file  . Minutes of Exercise per Session: Not on file  Stress:   . Feeling of Stress : Not on file  Social Connections: Socially Integrated  . Frequency of Communication with Friends and Family: More than three times a week  . Frequency of Social Gatherings with Friends and Family: More than three times a week  . Attends Religious Services: More than 4 times per year  . Active Member of Clubs or Organizations: Yes  . Attends Archivist Meetings: More than 4 times per year  . Marital Status: Married  Human resources officer Violence:   . Fear of Current or Ex-Partner: Not on file  . Emotionally Abused: Not on file  . Physically Abused: Not on file  . Sexually Abused: Not on file    Vital Signs: Blood pressure 133/69, pulse 67, height 5' 5" (1.651 m), weight 202 lb (91.6 kg), SpO2 97 %.  Examination: General Appearance: The patient is well-developed, well-nourished, and in no distress. Neck Circumference: 41 Skin: Gross inspection of skin unremarkable. Head: normocephalic, no gross deformities. Eyes: no gross deformities noted. ENT: ears appear grossly normal Neurologic: Alert and oriented. No involuntary movements.    EPWORTH SLEEPINESS SCALE:  Scale:  (0)= no chance of dozing; (1)= slight chance of dozing; (2)= moderate  chance of dozing; (3)= high chance of dozing  Chance  Situtation    Sitting and reading: 1    Watching TV: 0    Sitting Inactive in public: 0    As a passenger in car: 0      Lying down to rest: 1    Sitting and talking: 0    Sitting quielty after lunch: 0    In a car, stopped in traffic: 0   TOTAL SCORE:   2 out of 24    SLEEP STUDIES:  1. PSg 03/2014 AHI 65 SpO66mn 83%   CPAP COMPLIANCE DATA:  Date Range: 03/12/19-03/10/20  Average Daily Use: 8.6 hours  Median Use: 8.7  Compliance for > 4 Hours: 99%  AHI: 0.9 respiratory events per hour  Days Used: 362/365  Mask Leak: 37.5  95th Percentile Pressure: 16.9   LABS: Recent Results (from the past 2160 hour(s))  Basic metabolic panel     Status: Abnormal   Collection Time: 12/14/19 10:48 AM  Result Value Ref Range   Sodium 136 135 - 145 mmol/L   Potassium 3.6 3.5 - 5.1 mmol/L   Chloride 103 98 - 111 mmol/L   CO2 23 22 - 32 mmol/L   Glucose, Bld 118 (H) 70 - 99 mg/dL    Comment: Glucose reference range applies only to samples taken after fasting for at least 8 hours.   BUN 19 8 - 23 mg/dL   Creatinine, Ser 1.12 (H) 0.44 - 1.00 mg/dL   Calcium 9.3 8.9 - 10.3 mg/dL   GFR calc non Af Amer 48 (L) >60 mL/min   GFR calc Af Amer 56 (L) >60 mL/min   Anion gap 10 5 - 15    Comment: [facility]formed [facility] [address], [place] [address] CBC     Status: None   Collection Time: 12/14/19 10:48 AM  Result Value Ref Range   WBC 4.8 4.0 - 10.5 K/uL   RBC 3.96 3.87 - 5.11 MIL/uL   Hemoglobin 12.5 12.0 - 15.0 g/dL   HCT 36.2 36 - 46 %  MCV 91.4 80.0 - 100.0 fL   MCH 31.6 26.0 - 34.0 pg   MCHC 34.5 30.0 - 36.0 g/dL   RDW 12.9 11.5 - 15.5 %   Platelets 150 150 - 400 K/uL   nRBC 0.0 0.0 - 0.2 %    Comment: Performed at [facility], [place], [place] [address]  Troponin I (High Sensitivity)     Status: None   Collection Time: 12/14/19 10:48 AM  Result Value  Ref Range   Troponin I (High Sensitivity) 6 <18 ng/L    Comment: (NOTE) Elevated high sensitivity troponin I (hsTnI) values and significant  changes across serial measurements may suggest ACS but many other  chronic and acute conditions are known to elevate hsTnI results.  Refer to the "Links" section for chest pain algorithms and additional  guidance. Performed at [facility], [address] [place], [place] [address]   SARS Coronavirus 2 by RT PCR (hospital order, performed in [facility] hospital lab) Nasopharyngeal Nasopharyngeal Swab     Status: Abnormal   Collection Time: 12/14/19  4:23 PM   Specimen: Nasopharyngeal Swab  Result Value Ref Range   SARS Coronavirus 2 POSITIVE (A) NEGATIVE    Comment: RESULT CALLED TO, READ BACK BY AND VERIFIED WITH: ARIEL SMITH _0  12/14/19 MJU (NOTE) SARS-CoV-2 target nucleic acids are DETECTED  SARS-CoV-2 RNA is generally detectable in upper respiratory specimens  during the acute phase of infection.  Positive results are indicative  of the presence of the identified virus, but do not rule out bacterial infection or co-infection with other pathogens not detected by the test.  Clinical correlation with patient history and  other diagnostic information is necessary to determine patient infection status.  The expected result is negative.  Fact Sheet for Patients:   StrictlyIdeas.no   Fact Sheet for Healthcare Providers:   BankingDealers.co.za    This test is not yet approved or cleared by the [place] FDA and  has been authorized for detection and/or diagnosis of SARS-CoV-2 by FDA under an Emergency Use Authorization (EUA).  This EUA will remain in effect (meaning this test can  be used) for the duration of  the COVID-19 declaration under Section 564(b)(1) of the Act, 21 U.S.C. section 360-bbb-3(b)(1), unless the authorization is terminated or revoked sooner.  Performed at  [facility], [place], [place] [address]   Troponin I (High Sensitivity)     Status: None   Collection Time: 12/14/19  4:40 PM  Result Value Ref Range   Troponin I (High Sensitivity) 7 <18 ng/L    Comment: (NOTE) Elevated high sensitivity troponin I (hsTnI) values and significant  changes across serial measurements may suggest ACS but many other  chronic and acute conditions are known to elevate hsTnI results.  Refer to the "Links" section for chest pain algorithms and additional  guidance. Performed at [facility], [address] [place], [place] [address]   CBC with Differential/Platelet     Status: None   Collection Time: 02/09/20  1:03 PM  Result Value Ref Range   WBC 6.6 4.0 - 10.5 K/uL   RBC 4.00 3.87 - 5.11 Mil/uL   Hemoglobin 12.7 12.0 - 15.0 g/dL   HCT 37.1 36 - 46 %   MCV 92.7 78.0 - 100.0 fl   MCHC 34.2 30.0 - 36.0 g/dL   RDW 14.2 11.5 - 15.5 %   Platelets 191.0 150 - 400 K/uL   Neutrophils Relative %  54.4 43 - 77 %   Lymphocytes Relative 35.6 12 - 46 %   Monocytes Relative 7.5 3 - 12 %   Eosinophils Relative 1.8 0 - 5 %   Basophils Relative 0.7 0 - 3 %   Neutro Abs 3.6 1.4 - 7.7 K/uL   Lymphs Abs 2.3 0.7 - 4.0 K/uL   Monocytes Absolute 0.5 0.1 - 1.0 K/uL   Eosinophils Absolute 0.1 0.0 - 0.7 K/uL   Basophils Absolute 0.0 0.0 - 0.1 K/uL  Basic metabolic panel     Status: Abnormal   Collection Time: 02/09/20  1:03 PM  Result Value Ref Range   Sodium 137 135 - 145 mEq/L   Potassium 4.1 3.5 - 5.1 mEq/L   Chloride 101 96 - 112 mEq/L   CO2 25 19 - 32 mEq/L   Glucose, Bld 101 (H) 70 - 99 mg/dL   BUN 21 6 - 23 mg/dL   Creatinine, Ser 0.95 0.40 - 1.20 mg/dL   GFR 57.27 (L) >60.00 mL/min   Calcium 9.7 8.4 - 10.5 mg/dL  Lipid panel     Status: Abnormal   Collection Time: 02/09/20  1:03 PM  Result Value Ref Range   Cholesterol 159 0 - 200 mg/dL    Comment: ATP III Classification       Desirable:  < 200 mg/dL                Borderline High:  200 - 239 mg/dL          High:  > = 240 mg/dL   Triglycerides 265.0 (H) 0 - 149 mg/dL    Comment: Normal:  <150 mg/dLBorderline High:  150 - 199 mg/dL   HDL 41.00 >39.00 mg/dL   VLDL 53.0 (H) 0.0 - 40.0 mg/dL   Total CHOL/HDL Ratio 4     Comment:                Men          Women1/2 Average Risk     3.4          3.3Average Risk          5.0          4.42X Average Risk          9.6          7.13X Average Risk          15.0          11.0                       NonHDL 117.80     Comment: NOTE:  Non-HDL goal should be 30 mg/dL higher than patient's LDL goal (i.e. LDL goal of < 70 mg/dL, would have non-HDL goal of < 100 mg/dL)  Hemoglobin A1c     Status: Abnormal   Collection Time: 02/09/20  1:03 PM  Result Value Ref Range   Hgb A1c MFr Bld 7.3 (H) 4.6 - 6.5 %    Comment: Glycemic Control Guidelines for People with Diabetes:Non Diabetic:  <6%Goal of Therapy: <7%Additional Action Suggested:  >8%   Hepatic function panel     Status: None   Collection Time: 02/09/20  1:03 PM  Result Value Ref Range   Total Bilirubin 0.9 0.2 - 1.2 mg/dL   Bilirubin, Direct 0.1 0.0 - 0.3 mg/dL   Alkaline Phosphatase 61 39 - 117 U/L   AST 19 0 - 37 U/L   ALT 25  0 - 35 U/L   Total Protein 7.3 6.0 - 8.3 g/dL   Albumin 4.5 3.5 - 5.2 g/dL  LDL cholesterol, direct     Status: None   Collection Time: 02/09/20  1:03 PM  Result Value Ref Range   Direct LDL 94.0 mg/dL    Comment: Optimal:  <100 mg/dLNear or Above Optimal:  100-129 mg/dLBorderline High:  130-159 mg/dLHigh:  160-189 mg/dLVery High:  >190 mg/dL    Radiology: No results found.  Assessment and Plan: Patient Active Problem List   Diagnosis Date Noted  . 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 History of COVID-19 01/07/2020  . Cracked skin on feet 10/01/2019  . History of colon polyps 10/01/2019  . Right groin pain 07/09/2019  . Seroma of breast 03/30/2018  .  Abdominal pain 02/27/2018  . Bradycardia 10/18/2017  . 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 OSA (obstructive sleep apnea) 10/18/2017  . COPD (chronic obstructive pulmonary disease) ([place]) 08/26/2017  . PAC (premature atrial contraction) 03/22/2017  . Palpitations 03/22/2017  . Back pain 10/14/2016  . Diabetes mellitus with cardiac complication ([place]) 03/47/4259  . Bilateral leg edema 06/04/2016  . Carpal tunnel syndrome 04/12/2016  . Lymphedema 12/28/2015  . Carotid artery stenosis 12/28/2015  . Fullness of breast 12/19/2015  . Musculoskeletal pain 10/18/2015  . S/P spinal surgery 10/18/2015  . 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 Neck pain 10/10/2015  . Hypertensive left ventricular hypertrophy 06/26/2015  . Trigger middle finger of right hand 05/27/2015  . Chest pain 05/08/2015  . SOB (shortness of breath) 05/08/2015  . Trigger finger, right 05/08/2015  . Combined fat and carbohydrate induced hyperlipemia 05/08/2015  . Pressure in head 02/08/2015  . Hypercholesterolemia 01/02/2015  . Breast cancer ([place]) 11/06/2014  . Health care maintenance 11/06/2014  . Ankle swelling 11/06/2014  . Stress 11/06/2014  . Neck fullness 06/29/2014  . Osteopenia 03/31/2014  . Snoring 11/05/2013  . Skin lesions 11/05/2013  . Light headedness 10/30/2013  . Abnormal liver function test 08/05/2012  . Thyroid nodule 05/31/2012  . CAD (coronary artery disease) 05/31/2012  . Hypertension 02/24/2012  . Peripheral vascular disease ([place]) 02/24/2012  . GERD (gastroesophageal reflux disease) 02/24/2012      The patient does tolerate PAP and reports significant benefit from PAP use. The patient is caring for her machine as recommended.[name] She was advised to avoid watching TV for long periods in bed. The patient was also counselled on continued efforts at weight loss. She is doing some leg cyclinng. The compliance is excellent. The apnea is very well controlled.   1. OSA- continue excellent compliance_0 2. CPAP  couseling-Discussed importance of adequate CPAP use as well as proper care and cleaning techniques of machine and all supplies. 3. GERD-Symptoms are stable at this time per her request  General Counseling: I have discussed the findings of the evaluation and examination with Lindsey Bentley.  I have also discussed any further diagnostic evaluation thatmay be needed or ordered today. Lindsey Bentley verbalizes understanding of the findings of todays visit. We also reviewed her medications today and discussed drug interactions and side effects including but not limited excessive drowsiness and altered mental states. We also discussed that there is always a risk not just to her but also people around her. she has been encouraged to call the office with any questions or concerns that should arise related to todays visit.  I have personally obtained a history, examined the patient, evaluated laboratory and  imaging results, formulated the assessment and plan and placed orders.  This patient was seen by Lindsey Bentley, AGNP-C in collaboration with Dr. Devona Konig as a part of collaborative care agreement.  Richelle Ito Saunders Glance, PhD, FAASM  Diplomate, American Board of Sleep Medicine    Allyne Gee, MD [facility] Diplomate ABMS Pulmonary and Critical Care Medicine Sleep medicine

## 2020-03-11 NOTE — Patient Instructions (Signed)

## 2020-03-25 ENCOUNTER — Other Ambulatory Visit: Payer: Self-pay | Admitting: Internal Medicine

## 2020-04-02 ENCOUNTER — Telehealth: Payer: Self-pay | Admitting: Internal Medicine

## 2020-04-02 NOTE — Telephone Encounter (Signed)
Pt scheduled  

## 2020-04-02 NOTE — Telephone Encounter (Signed)
She has had this place for 2 weeks and stated she was fine to wait until Monday to be seen.

## 2020-04-02 NOTE — Telephone Encounter (Signed)
Patient called and wanted to make an appointment for someone to check out a lump that is on her butt. It is getting very sore and wants it checked since she has had breast cancer. Patient said she can wait until after Thanksgiving it she has too. No available appointments at time of call.

## 2020-04-08 ENCOUNTER — Ambulatory Visit (INDEPENDENT_AMBULATORY_CARE_PROVIDER_SITE_OTHER): Payer: Medicare Other | Admitting: Internal Medicine

## 2020-04-08 ENCOUNTER — Other Ambulatory Visit: Payer: Self-pay

## 2020-04-08 ENCOUNTER — Other Ambulatory Visit (HOSPITAL_COMMUNITY)
Admission: RE | Admit: 2020-04-08 | Discharge: 2020-04-08 | Disposition: A | Discharge status: Home / Self Care | Payer: Medicare Other | Source: Ambulatory Visit | Attending: Internal Medicine | Admitting: Internal Medicine

## 2020-04-08 VITALS — BP 136/70 | HR 60 | Temp 98.5°F | Resp 16 | Ht 65.0 in | Wt 203.4 lb

## 2020-04-08 DIAGNOSIS — I10 Essential (primary) hypertension: Secondary | ICD-10-CM

## 2020-04-08 DIAGNOSIS — N898 Other specified noninflammatory disorders of vagina: Secondary | ICD-10-CM

## 2020-04-08 DIAGNOSIS — N760 Acute vaginitis: Secondary | ICD-10-CM

## 2020-04-08 DIAGNOSIS — E1159 Type 2 diabetes mellitus with other circulatory complications: Secondary | ICD-10-CM | POA: Diagnosis not present

## 2020-04-08 MED ORDER — ONETOUCH DELICA LANCETS 30G MISC: 3 refills | Status: AC

## 2020-04-08 MED ORDER — MUPIROCIN 2 % EX OINT: 1.0000 "application " | TOPICAL_OINTMENT | Freq: Two times a day (BID) | CUTANEOUS | 0 refills | Status: DC

## 2020-04-08 MED ORDER — ONETOUCH VERIO VI STRP: ORAL_STRIP | 3 refills | Status: AC

## 2020-04-08 NOTE — Progress Notes (Signed)
[name], female   DOB: [birth year], 74 y.o.   MRN: [id number]   Subjective:    [name], female    DOB: Aug 30, 1945, 74 y.o.   MRN: [id number]  HPI This visit occurred during the SARS-CoV-2 public health emergency.  Safety protocols were in place, including screening questions prior to the visit, additional usage of staff PPE, and extensive cleaning of exam room while observing appropriate contact time as indicated for disinfecting solutions.  Patient here for work in appt. With concerns regarding a vaginal lesion/bump.  Reports first noticed 3.5 weeks ago.  She felt it.  Was not having any pain.  No vaginal discharge.  Reports previously noticing some pressure.  Eating.  No abdominal pain.  No nausea or vomiting.  Bowels moving.    Past Medical History:  Diagnosis Date  . Arthritis   . Breast cancer (HCC)   . Breast cancer, left (HCC) 2016   LT LUMPECTOMY - TI, NO, MO - IDC, ER/PR pos, Her 2 neg, Rad tx's.   . Carotid artery occlusion   . Carotid artery occlusion   . 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 CHF (congestive heart failure) (HCC)   . COPD (chronic obstructive pulmonary disease) (HCC)   . Coronary artery disease   . Depression   . Diffuse cystic mastopathy 2013  . DVT (deep venous thrombosis) (HCC)   . Dyspnea   . Elevated TSH   . Family history of adverse reaction to anesthesia    Pt stated that son had a seizure with a combination of anesthesia and pain medication."  . Fatty liver   . GERD (gastroesophageal reflux disease)   . Glaucoma   . High cholesterol   . History of chicken pox   . Hyperglycemia   . Hyperlipidemia   . Hypertension   . LVH (left ventricular hypertrophy)   . PAC (premature atrial contraction)   . Palpitations   . Peripheral vascular disease (HCC)   . Personal history of radiation therapy 2016   BREAST CA  . Pneumonia March 2014  . Skin cancer 2013  . Sleep apnea    wears CPAP  set at 2.5  . Wears glasses    Past Surgical History:  Procedure Laterality Date  . BACK SURGERY  1986   ruptured disc  . BREAST BIOPSY Left 2008   NEG  . BREAST BIOPSY Left 08-20-14   POS  . BREAST BIOPSY Right 2007   NEG  . BREAST EXCISIONAL BIOPSY Left 1984   NEG  . 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 BREAST SURGERY Left 09/05/2014   T1a,N0; 5 mm  ER/PR positive. HER2 negative.  Wide excision with SLN biopsy.  MammoSite radiation  . CARPAL TUNNEL RELEASE    . CHOLECYSTECTOMY  2006  . COLONOSCOPY  2010   Dr. Evette Cristal  . COLONOSCOPY WITH PROPOFOL N/A 05/17/2017   Procedure: COLONOSCOPY WITH PROPOFOL;  Surgeon: Scot Jun, MD;  Location[facility] ENDOSCOPY;  Service: Endoscopy;  Laterality: N/A;  . DILATION AND CURETTAGE OF UTERUS    . ESOPHAGOGASTRODUODENOSCOPY (EGD) WITH PROPOFOL N/A 05/17/2017   Procedure: ESOPHAGOGASTRODUODENOSCOPY (EGD) WITH PROPOFOL;  Surgeon: Scot Jun, MD;  Location: [facility] ENDOSCOPY;  Service: Endoscopy;  Laterality: N/A;  . LAPAROTOMY FOR REMOVAL TUMOR LUMBAR PLEXES    . Leg stent  2011  . MOLE REMOVAL  2013   15 removed  . POSTERIOR CERVICAL LAMINECTOMY Left  10/15/2015   Procedure: Left Cervical four- five Hemilaminectomy/Remove mass;  Surgeon: Leeroy Cha, MD;  Location: [place] NEURO ORS;  Service: Neurosurgery;  Laterality: Left;  Left C4-5 Hemilaminectomy/Remove mass   Family History  Problem Relation Age of Onset  . Cancer Father        Prostate  . Diabetes Father   . Cerebrovascular Accident Father   . Hypertension Mother   . AAA (abdominal aortic aneurysm) Mother   . Hyperlipidemia Other        Parent  . Miscarriages / Stillbirths Other        Parent  . Hypertension Other        parent  . Heart disease Other        Parent  . Breast cancer Maternal Aunt 72   Social History   Socioeconomic History  . Marital status: Married    Spouse name: Not on file  . Number of children: 3  . Years of education: 68  . Highest education  level: Not on file  Occupational History  . Occupation: Caregiver   Tobacco Use  . Smoking status: Never Smoker  . Smokeless tobacco: Never Used  Substance and Sexual Activity  . Alcohol use: No    Alcohol/week: 0.0 standard drinks  . Drug use: No  . Sexual activity: Never  Other Topics Concern  . Not on file  Social History Narrative   Regular exercise-mo   Caffeine Use-no   Social Determinants of Health   Financial Resource Strain: Medium Risk  . Difficulty of Paying Living Expenses: Somewhat hard  Food Insecurity:   . Worried About Charity fundraiser in the Last Year: Not on file  . Ran Out of Food in the Last Year: Not on file  Transportation Needs:   . Lack of Transportation (Medical): Not on file  . Lack of Transportation (Non-Medical): Not on file  Physical Activity:   . Days of Exercise per Week: Not on file  . Minutes of Exercise per Session: Not on file  Stress: Stress Concern Present  . Feeling of Stress : To some extent  Social Connections: Socially Integrated  . Frequency of Communication with Friends and Family: More than three times a week  . Frequency of Social Gatherings with Friends and Family: More than three times a week  . Attends Religious Services: More than 4 times per year  . Active Member of Clubs or Organizations: Yes  . Attends Archivist Meetings: More than 4 times per year  . Marital Status: Married    Outpatient Encounter Medications as of 04/08/2020  Medication Sig  . aspirin (BAYER ASPIRIN) 325 MG tablet Take 325 mg by mouth daily.  . carvedilol (COREG) 12.5 MG tablet TAKE 1 TABLET(12.5 MG) BY MOUTH TWICE DAILY WITH MEALS  . Cholecalciferol (VITAMIN D-1000 MAX ST) 1000 units tablet Take 1,000 Units by mouth daily.   . empagliflozin (JARDIANCE) 25 MG TABS tablet Take 1 tablet (25 mg total) by mouth daily before breakfast.  . glucose blood (ONETOUCH VERIO) test strip Use to check blood sugars up to three times daily.  .  hydrALAZINE (APRESOLINE) 10 MG tablet TAKE 1 TABLET(10 MG) BY MOUTH THREE TIMES DAILY  . losartan-hydrochlorothiazide (HYZAAR) 100-25 MG tablet Take 1 tablet by mouth daily.  . meclizine (ANTIVERT) 25 MG tablet Take 1 tablet (25 mg total) by mouth 3 (three) times daily as needed for dizziness or nausea. (Patient not taking: Reported on 04/12/2020)  . mupirocin ointment (BACTROBAN) 2 %  Apply 1 application topically 2 (two) times daily.  [name] omeprazole (PRILOSEC) 40 MG capsule TAKE 1 CAPSULE(40 MG) BY MOUTH DAILY  . OneTouch Delica Lancets 20B MISC Use to check blood sugars up to 3 times daily. (Patient not taking: Reported on 04/12/2020)  . PROAIR HFA 108 (90 Base) MCG/ACT inhaler INL 2 INHALATIONS ITL Q 6 H PRF WHZ  . vitamin E 400 UNIT capsule Take 400 Units by mouth daily.  . [DISCONTINUED] rosuvastatin (CRESTOR) 20 MG tablet TAKE 1 TABLET(20 MG) BY MOUTH DAILY   No facility-administered encounter medications on file as of 04/08/2020.    Review of Systems  Constitutional: Negative for appetite change and fever.  Respiratory: Negative for cough, chest tightness and shortness of breath.   Cardiovascular: Negative for chest pain and palpitations.  Gastrointestinal: Negative for abdominal pain, diarrhea, nausea and vomiting.  Genitourinary: Negative for difficulty urinating and dysuria.       Vaginal lesion as outlined.  No vaginal discharge.    Musculoskeletal: Negative for joint swelling and myalgias.  Skin: Negative for color change and rash.  Neurological: Negative for light-headedness and headaches.  Psychiatric/Behavioral: Negative for agitation and dysphoric mood.       Objective:    Physical Exam Vitals reviewed.  Constitutional:      General: She is not in acute distress.    Appearance: Normal appearance.  HENT:     Head: Normocephalic and atraumatic.     Right Ear: External ear normal.     Left Ear: External ear normal.  Neck:     Thyroid: No thyromegaly.  Cardiovascular:       Rate and Rhythm: Normal rate and regular rhythm.  Pulmonary:     Effort: No respiratory distress.     Breath sounds: Normal breath sounds. No wheezing.  Abdominal:     General: Bowel sounds are normal.     Palpations: Abdomen is soft.     Tenderness: There is no abdominal tenderness.  Genitourinary:    Comments: Normal external genitalia, except for raised lesion outer lips.  No pain.  No ulceration.  Vaginal vault without lesions.   Could not appreciate any adnexal masses or tenderness.  Wet prep obtained.   Musculoskeletal:        General: No swelling or tenderness.     Cervical back: Neck supple. No tenderness.  Lymphadenopathy:     Cervical: No cervical adenopathy.  Skin:    Findings: No erythema or rash.  Neurological:     Mental Status: She is alert.  Psychiatric:        Mood and Affect: Mood normal.        Behavior: Behavior normal.     BP 136/70   Pulse 60   Temp 98.5 F (36.9 C) (Oral)   Resp 16   Wt 203 lb 6.4 oz (92.3 kg)   SpO2 98%   BMI 33.85 kg/m  Wt Readings from Last 3 Encounters:  04/08/20 203 lb 6.4 oz (92.3 kg)  03/11/20 202 lb (91.6 kg)  02/26/20 202 lb (91.6 kg)     Lab Results  Component Value Date   WBC 6.6 02/09/2020   HGB 12.7 02/09/2020   HCT 37.1 02/09/2020   PLT 191.0 02/09/2020   GLUCOSE 101 (H) 02/09/2020   CHOL 159 02/09/2020   TRIG 265.0 (H) 02/09/2020   HDL 41.00 02/09/2020   LDLDIRECT 94.0 02/09/2020   LDLCALC 64 04/15/2017   ALT 25 02/09/2020   AST 19 02/09/2020   NA  137 02/09/2020   K 4.1 02/09/2020   CL 101 02/09/2020   CREATININE 0.95 02/09/2020   BUN 21 02/09/2020   CO2 25 02/09/2020   TSH 4.34 05/17/2019   INR 0.9 07/13/2012   HGBA1C 7.3 (H) 02/09/2020   MICROALBUR 1.2 05/17/2019       Assessment & Plan:   Problem List Items Addressed This Visit    Vaginal lesion    Vaginal lesion as outlined.  Discussed warm compresses.  Bactroban.  KOH/wet prep obtained.  Follow.  Call with update.         Hypertension    Blood pressure as outlined.  Continue losartan/hctz, coreg and hydralazine.  Follow pressures.  Follow metabolic panel.       Diabetes mellitus with cardiac complication ([place])    Tolerating jardiance. Follow met b and a1c.        Other Visit Diagnoses    Acute vaginitis    -  Primary   Relevant Orders   Cervicovaginal ancillary only( [place]) (Completed)       Einar Pheasant, MD

## 2020-04-10 ENCOUNTER — Encounter: Payer: Self-pay | Admitting: Internal Medicine

## 2020-04-10 LAB — CERVICOVAGINAL ANCILLARY ONLY
Bacterial Vaginitis (gardnerella): NEGATIVE
Candida Glabrata: NEGATIVE
Candida Vaginitis: NEGATIVE
Comment: NEGATIVE
Comment: NEGATIVE
Comment: NEGATIVE

## 2020-04-12 ENCOUNTER — Ambulatory Visit: Payer: Medicare Other | Admitting: Pharmacist

## 2020-04-12 DIAGNOSIS — I739 Peripheral vascular disease, unspecified: Secondary | ICD-10-CM

## 2020-04-12 DIAGNOSIS — E7800 Pure hypercholesterolemia, unspecified: Secondary | ICD-10-CM

## 2020-04-12 DIAGNOSIS — E1159 Type 2 diabetes mellitus with other circulatory complications: Secondary | ICD-10-CM

## 2020-04-12 DIAGNOSIS — I10 Essential (primary) hypertension: Secondary | ICD-10-CM

## 2020-04-12 MED ORDER — ROSUVASTATIN CALCIUM 40 MG PO TABS: 40.0000 mg | ORAL_TABLET | Freq: Every day | ORAL | 3 refills | Status: DC

## 2020-04-12 NOTE — Chronic Care Management (AMB) (Signed)
Chronic Care Management   Pharmacy Note  04/12/2020 Name: Lindsey Bentley MRN: [id number] DOB: 1945-06-16   Subjective:  Lindsey Bentley is a 74 y.o. year old female who is a primary care patient of Einar Pheasant, MD. The CCM team was consulted for assistance with chronic disease management and care coordination needs.    Engaged with patient by telephone for follow up visit in response to provider referral for pharmacy case management and/or care coordination services.   Consent to Services:  @M @ @LNAME @ was given information about Chronic Care Management services, agreed to services, and gave verbal consent prior to initiation of services on 03/2019. Please see initial visit note for detailed documentation.   SDOH (Social Determinants of Health) assessments and interventions performed:  SDOH Interventions     Most Recent Value  SDOH Interventions  Financial Strain Interventions Other (Comment)  [manufacturer assistacen]  Stress Interventions Other (Comment)  [offered LCSW referral. Patient declined]       Objective:  Lab Results  Component Value Date   CREATININE 0.95 02/09/2020   CREATININE 1.12 (H) 12/14/2019   CREATININE 0.98 09/06/2019    Lab Results  Component Value Date   HGBA1C 7.3 (H) 02/09/2020       Component Value Date/Time   CHOL 159 02/09/2020 1303   TRIG 265.0 (H) 02/09/2020 1303   HDL 41.00 02/09/2020 1303   CHOLHDL 4 02/09/2020 1303   VLDL 53.0 (H) 02/09/2020 1303   LDLCALC 64 04/15/2017 0841   LDLDIRECT 94.0 02/09/2020 1303    BP Readings from Last 3 Encounters:  04/08/20 136/70  03/11/20 133/69  02/26/20 (!) 156/57    Assessment/Interventions: Review of patient past medical history, allergies, medications, health status, including review of consultants reports, laboratory and other test data, was performed as part of comprehensive evaluation and provision of chronic care management services.   Allergies  Allergen Reactions  . Codeine  Nausea And Vomiting  . Prednisone Swelling  . Statins Other (See Comments)    Muscle Pain, can take Crestor  . Tape Itching and Rash    Please use "paper" tape Please use "paper" tape    Medications Reviewed Today    Reviewed by [name], RPH-CPP (Pharmacist) on 04/12/20 at 0909  Med List Status: <None>  Medication Order Taking? Sig Documenting Provider Last Dose Status Informant  aspirin (BAYER ASPIRIN) 325 MG tablet [id number] Yes Take 325 mg by mouth daily. [provider] Taking Active Self  carvedilol (COREG) 12.5 MG tablet [id number] Yes TAKE 1 TABLET(12.5 MG) BY MOUTH TWICE DAILY WITH MEALS Einar Pheasant, MD Taking Active   Cholecalciferol (VITAMIN D-1000 MAX ST) 1000 units tablet [id number] Yes Take 1,000 Units by mouth daily.  [provider] Taking Active Self           Med Note Meda Coffee, Rex Kras   Wed Oct 09, 2015  4:29 PM)    empagliflozin (JARDIANCE) 25 MG TABS tablet [id number] Yes Take 1 tablet (25 mg total) by mouth daily before breakfast. Einar Pheasant, MD Taking Active   glucose blood (ONETOUCH VERIO) test strip [id number] Yes Use to check blood sugars up to three times daily. Einar Pheasant, MD Taking Active   hydrALAZINE (APRESOLINE) 10 MG tablet [id number] Yes TAKE 1 TABLET(10 MG) BY MOUTH THREE TIMES DAILY Einar Pheasant, MD Taking Active   losartan-hydrochlorothiazide [facility]) 100-25 MG tablet [id number] Yes Take 1 tablet by mouth daily. Einar Pheasant, MD Taking Active   meclizine (ANTIVERT) 25 MG tablet [id number] No Take  1 tablet (25 mg total) by mouth 3 (three) times daily as needed for dizziness or nausea.  Patient not taking: Reported on 04/12/2020   Paulette Blanch, MD Not Taking Active Self           Med Note Darnelle Maffucci, Maralyn Sago Mar 23, 2019  4:12 PM) PRN  mupirocin ointment (BACTROBAN) 2 % [id number]  Apply 1 application topically 2 (two) times daily. Einar Pheasant, MD  Active   omeprazole (PRILOSEC) 40 MG capsule [id number] Yes  TAKE 1 CAPSULE(40 MG) BY MOUTH DAILY Einar Pheasant, MD Taking Active   OneTouch Delica Lancets 23J MISC [id number] No Use to check blood sugars up to 3 times daily.  Patient not taking: Reported on 04/12/2020   Einar Pheasant, MD Not Taking Active   PROAIR HFA 108 [phone] Base) MCG/ACT inhaler [id number] Yes INL 2 INHALATIONS ITL Q 6 H PRF WHZ [provider] Taking Active Self           Med Note Nat Christen Mar 23, 2019  4:11 PM) PRN  rosuvastatin (CRESTOR) 20 MG tablet [id number] Yes TAKE 1 TABLET(20 MG) BY MOUTH DAILY Einar Pheasant, MD Taking Active   vitamin E 400 UNIT capsule [id number] Yes Take 400 Units by mouth daily. [provider] Taking Active Self          Patient Active Problem List   Diagnosis Date Noted  . Carcinoma of lower-outer quadrant of left breast in female, estrogen receptor positive ([place]) 02/26/2020  . History of COVID-19 01/07/2020  . Cracked skin on feet 10/01/2019  . History of colon polyps 10/01/2019  . Right groin pain 07/09/2019  . Seroma of breast 03/30/2018  . Abdominal pain 02/27/2018  . Bradycardia 10/18/2017  . 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 OSA (obstructive sleep apnea) 10/18/2017  . COPD (chronic obstructive pulmonary disease) ([place]) 08/26/2017  . PAC (premature atrial contraction) 03/22/2017  . Palpitations 03/22/2017  . Back pain 10/14/2016  . Diabetes mellitus with cardiac complication ([place]) 82/99/3716  . Bilateral leg edema 06/04/2016  . Carpal tunnel syndrome 04/12/2016  . Lymphedema 12/28/2015  . Carotid artery stenosis 12/28/2015  . Fullness of breast 12/19/2015  . Musculoskeletal pain 10/18/2015  . S/P spinal surgery 10/18/2015  . 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 Neck pain 10/10/2015  . Hypertensive left ventricular hypertrophy 06/26/2015  . Trigger middle finger of right hand 05/27/2015  . Chest pain 05/08/2015  . SOB (shortness of breath) 05/08/2015  . Trigger finger, right 05/08/2015  . Combined  fat and carbohydrate induced hyperlipemia 05/08/2015  . Pressure in head 02/08/2015  . Hypercholesterolemia 01/02/2015  . Breast cancer ([place]) 11/06/2014  . Health care maintenance 11/06/2014  . Ankle swelling 11/06/2014  . Stress 11/06/2014  . Neck fullness 06/29/2014  . Osteopenia 03/31/2014  . Snoring 11/05/2013  . Skin lesions 11/05/2013  . Light headedness 10/30/2013  . Abnormal liver function test 08/05/2012  . Thyroid nodule 05/31/2012  . CAD (coronary artery disease) 05/31/2012  . Hypertension 02/24/2012  . Peripheral vascular disease ([place]) 02/24/2012  . GERD (gastroesophageal reflux disease) 02/24/2012    Medication Assistance: Application for Jardiance medication assistance program in process. Anticipated assistance start date TBD. See plan of care below for additional detail.   Patient Care Plan: Medication Management    Problem Identified: Diabetes, Hypertension, Hyperlipidemia     Long-Range Goal: Disease Progression Prevention   This Visit's Progress: On track  Priority: High  Note:   Current Barriers:  .  Unable to independently afford treatment regimen . Unable to maintain control of diabetes, hyperlipidemia  Pharmacist Clinical Goal(s):  [name] Over the next 90 days, patient will verbalize ability to afford treatment regimen. . Over the next 90 days, patient will achieve adherence to monitoring guidelines and medication adherence to achieve therapeutic efficacy. . Over the next 90 days, patient will achieve control of diabetes and hyperlipidemia as evidenced by improvement in lab work through collaboration with PharmD and provider.   Interventions: . Inter-disciplinary care team collaboration (see longitudinal plan of care) . Comprehensive medication review performed; medication list updated in electronic medical record  Diabetes: . Uncontrolled; current treatment: Jardiance 25 mg daily  o Hx intolerance to metformin, loose stools . Current glucose readings:  fasting glucose: 100-110s, not taking post prandial readings . Denies hypoglycemic symptoms . Continue current regimen. Expect improvement in A1c.  . Due to reapply for [facility] assistance for Jardiance. Will collaborate w/ PCP and CPhT.   Hypertension: . Controlled; current treatment: hydralazine 10 mg TID, losartan/HCTZ 100/25 mg daily, carvedilol 6.25 mg BID . Recommended to continue current regimen  Hyperlipidemia, vascular disease . Uncontrolled; current treatment: rosuvastatin 20 mg daily  . Antiplatelet regimen: aspirin 325 mg daily (recommended by vascular) . Counseled on goal LDL <70 given DM, HTN, vascular disease.  Patient amenable to rosuvastatin dose increase. Increase rosuvastatin to 40 mg daily. Patient counseled to take 2 of the 20 mg tabs she has until complete   Patient Goals/Self-Care Activities . Over the next 90 days, patient will:  - take medications as prescribed check blood glucose BID, document, and provide at future appointments check blood pressure periodically, document, and provide at future appointments collaborate with provider on medication access solutions  Follow Up Plan: Telephone follow up appointment with care management team member scheduled for:~ 6 weeks       Plan: Telephone follow up appointment with care management team member scheduled for: ~ 6 weeks  Catie Darnelle Maffucci, PharmD, [place], [place] Pharmacist [place] [phone]

## 2020-04-12 NOTE — Patient Instructions (Addendum)
Lindsey Bentley,   It was great talking with you today!  Our goal A1c is less than 7%. This corresponds with fasting sugars less than 130 and 2 hour after meal sugars less than 180. Please check your blood sugar twice daily - fasting and 2 hours after meals. Continue taking Jardiance 25 mg daily. Be on the lookout for the paperwork from [place] to reapply for assistance for 2022.  Our goal bad cholesterol, or LDL, is less than 70. We are increasing rosuvastatin to 40 mg daily to help better control your cholesterol and your risk of heart disease.   Feel free to call me with any questions or concerns. I look forward to our next visit!  Catie Darnelle Maffucci, PharmD, [place], CPP Direct line: [facility] phone: [phone]   Visit Information  Patient Care Plan: Medication Management    Problem Identified: Diabetes, Hypertension, Hyperlipidemia     Long-Range Goal: Disease Progression Prevention   This Visit's Progress: On track  Priority: High  Note:   Current Barriers:  . Unable to independently afford treatment regimen . Unable to maintain control of diabetes, hyperlipidemia  Pharmacist Clinical Goal(s):  [name] Over the next 90 days, patient will verbalize ability to afford treatment regimen. . Over the next 90 days, patient will achieve adherence to monitoring guidelines and medication adherence to achieve therapeutic efficacy. . Over the next 90 days, patient will achieve control of diabetes and hyperlipidemia as evidenced by improvement in lab work through collaboration with PharmD and provider.   Interventions: . Inter-disciplinary care team collaboration (see longitudinal plan of care) . Comprehensive medication review performed; medication list updated in electronic medical record  Diabetes: . Uncontrolled; current treatment: Jardiance 25 mg daily  o Hx intolerance to metformin, loose stools . Current glucose readings: fasting glucose: 100-110s, not taking post prandial  readings . Denies hypoglycemic symptoms . Continue current regimen. Expect improvement in A1c.  . Due to reapply for [facility] assistance for Jardiance. Will collaborate w/ PCP and CPhT.   Hypertension: . Controlled; current treatment: hydralazine 10 mg TID, losartan/HCTZ 100/25 mg daily, carvedilol 6.25 mg BID . Recommended to continue current regimen  Hyperlipidemia, vascular disease . Uncontrolled; current treatment: rosuvastatin 20 mg daily  . Antiplatelet regimen: aspirin 325 mg daily (recommended by vascular) . Counseled on goal LDL <70 given DM, HTN, vascular disease.  Patient amenable to rosuvastatin dose increase. Increase rosuvastatin to 40 mg daily. Patient counseled to take 2 of the 20 mg tabs she has until complete   Patient Goals/Self-Care Activities . Over the next 90 days, patient will:  - take medications as prescribed check blood glucose BID, document, and provide at future appointments check blood pressure periodically, document, and provide at future appointments collaborate with provider on medication access solutions  Follow Up Plan: Telephone follow up appointment with care management team member scheduled for:~ 6 weeks      The patient verbalized understanding of instructions, educational materials, and care plan provided today and agreed to receive a mailed copy of patient instructions, educational materials, and care plan.    Plan: Telephone follow up appointment with care management team member scheduled for: ~ 6 weeks  Catie Darnelle Maffucci, PharmD, [place], [place] Pharmacist [place] [phone]

## 2020-04-15 ENCOUNTER — Encounter: Payer: Self-pay | Admitting: Internal Medicine

## 2020-04-15 DIAGNOSIS — N898 Other specified noninflammatory disorders of vagina: Secondary | ICD-10-CM | POA: Insufficient documentation

## 2020-04-15 NOTE — Assessment & Plan Note (Signed)
Blood pressure as outlined.  Continue losartan/hctz, coreg and hydralazine.  Follow pressures.  Follow metabolic panel.

## 2020-04-15 NOTE — Assessment & Plan Note (Signed)
Vaginal lesion as outlined.  Discussed warm compresses.  Bactroban.  KOH/wet prep obtained.  Follow.  Call with update.

## 2020-04-15 NOTE — Assessment & Plan Note (Signed)
Tolerating jardiance. Follow met b and a1c.

## 2020-04-17 ENCOUNTER — Telehealth: Payer: Self-pay | Admitting: Internal Medicine

## 2020-04-17 NOTE — Telephone Encounter (Signed)
Patient states lesion is still improving; getting smaller and not as hard as before. Advised to let us know if it gets no better or gets worse and patient agrees to do so.

## 2020-04-17 NOTE — Telephone Encounter (Signed)
Please contact pt and see if her vaginal lesion has continued to improve.  Let me know if persistent problems.

## 2020-04-17 NOTE — Telephone Encounter (Signed)
-----   Message from [name], Gisela sent at 04/12/2020 11:36 AM EST ----- Patient wanted to pass along the following regarding vaginal lesion follow up  "Seems to be decreasing in size, not completely gone, but doesn't seem as big, salve does seem to be helping. Not completed gone, but not like it was". She anticipates resolution by next week, based on change over time since starting mupirocin

## 2020-05-15 ENCOUNTER — Telehealth: Payer: Self-pay | Admitting: Pharmacist

## 2020-05-15 NOTE — Telephone Encounter (Signed)
Patient noted to CPhT that she is going to run out of her home supply of Jardiance before she receives next shipment from [facility].   Medication Samples have been provided to the patient.  Drug name: Jardiance       Strength: 25 mg        Qty: 3 boxes LOT: [id number] Exp.Date: 01/2022

## 2020-05-20 NOTE — Telephone Encounter (Signed)
Patient called, noting that she received BI supply of Jardiance and does not require these samples. Returning to stock.

## 2020-05-23 DIAGNOSIS — G4733 Obstructive sleep apnea (adult) (pediatric): Secondary | ICD-10-CM | POA: Diagnosis not present

## 2020-05-24 ENCOUNTER — Other Ambulatory Visit: Payer: Self-pay | Admitting: Internal Medicine

## 2020-05-27 ENCOUNTER — Ambulatory Visit: Payer: Medicare Other | Admitting: Pharmacist

## 2020-05-27 DIAGNOSIS — E1159 Type 2 diabetes mellitus with other circulatory complications: Secondary | ICD-10-CM

## 2020-05-27 DIAGNOSIS — I10 Essential (primary) hypertension: Secondary | ICD-10-CM

## 2020-05-27 DIAGNOSIS — I739 Peripheral vascular disease, unspecified: Secondary | ICD-10-CM

## 2020-05-27 DIAGNOSIS — E7800 Pure hypercholesterolemia, unspecified: Secondary | ICD-10-CM

## 2020-05-27 NOTE — Patient Instructions (Signed)
Visit Information  Patient Care Plan: Medication Management    Problem Identified: Diabetes, Hypertension, Hyperlipidemia     Long-Range Goal: Disease Progression Prevention   This Visit's Progress: On track  Recent Progress: On track  Priority: High  Note:   Current Barriers:  . Unable to independently afford treatment regimen . Unable to maintain control of diabetes, hyperlipidemia  Pharmacist Clinical Goal(s):  [name] Over the next 90 days, patient will verbalize ability to afford treatment regimen. . Over the next 90 days, patient will achieve adherence to monitoring guidelines and medication adherence to achieve therapeutic efficacy. . Over the next 90 days, patient will achieve control of diabetes and hyperlipidemia as evidenced by improvement in lab work through collaboration with PharmD and provider.   Interventions: . 1:1 collaboration with Einar Pheasant, MD regarding development and update of comprehensive plan of care as evidenced by provider attestation and co-signature . Inter-disciplinary care team collaboration (see longitudinal plan of care) . Comprehensive medication review performed; medication list updated in electronic medical record  Health Maintenance: . Extensive discussion regarding COVID vaccination benefits. Reviewed that she is >90 days out from [place] diagnosis/mAB tx, so she can receive vaccination. Discussed her misgivings. Encouraged to call her local pharmacy to schedule vaccination appointment. Patient notes that she will consider.  . Reviewed need for yearly eye exam. Patient notes she has an appointment scheduled in March. . Notes that vaginal lesion has completely resolved.   Diabetes: . Uncontrolled; current treatment: Jardiance 25 mg daily  o Hx intolerance to metformin, loose stools . Current glucose readings: fasting glucose: 120-125s, reports occasional after meal readings ~120s . Denies hypoglycemic symptoms . Continue current regimen. A1c due  with next PCP visit . Re-enrolled for Jardiance assistance through [employer]. Reviewed refill procedure  Hypertension: . Controlled; current treatment: hydralazine 10 mg TID, losartan/HCTZ 100/25 mg daily, carvedilol 6.25 mg BID . Home BP readings: checks periodically, notes she remembers that all readings have been "good" with SBP in 120s . Recommended to continue current regimen along with periodic BP monitoring  Hyperlipidemia, vascular disease . Uncontrolled, though anticipated to be improved; current treatment: rosuvastatin 40 mg daily; denies any concerns since increasing dose . Antiplatelet regimen: aspirin 325 mg daily (recommended by vascular) . Continue current regimen. Updated lab work due with next PCP appointment.   GERD: . Controlled per patient report; current regimen: omeprazole 40 mg daily . Continue current regimen at this time  Patient Goals/Self-Care Activities . Over the next 90 days, patient will:  - take medications as prescribed check blood glucose BID, document, and provide at future appointments check blood pressure periodically, document, and provide at future appointments collaborate with provider on medication access solutions  Follow Up Plan: Telephone follow up appointment with care management team member scheduled for:~ 10 weeks      The patient verbalized understanding of instructions, educational materials, and care plan provided today and declined offer to receive copy of patient instructions, educational materials, and care plan.   Plan: Telephone follow up appointment with care management team member scheduled for:  ~ 10 weeks  Catie Darnelle Maffucci, PharmD, [place], [place] Clinical Pharmacist [employer] at [employer] [phone]

## 2020-05-27 NOTE — Chronic Care Management (AMB) (Signed)
Chronic Care Management Pharmacy Note  05/27/2020 Name:  Lindsey Bentley MRN:  [id number] DOB:  [birth year]  Subjective: Lindsey Bentley is an 75 y.o. year old female who is a primary patient of Einar Pheasant, MD.  The CCM team was consulted for assistance with disease management and care coordination needs.    Engaged with patient by telephone for follow up visit in response to provider referral for pharmacy case management and/or care coordination services.   Consent to Services:  The patient was given information about Chronic Care Management services, agreed to services, and gave verbal consent prior to initiation of services.  Please see initial visit note for detailed documentation.   Objective:  Lab Results  Component Value Date   CREATININE 0.95 02/09/2020   CREATININE 1.12 (H) 12/14/2019   CREATININE 0.98 09/06/2019    Lab Results  Component Value Date   HGBA1C 7.3 (H) 02/09/2020       Component Value Date/Time   CHOL 159 02/09/2020 1303   TRIG 265.0 (H) 02/09/2020 1303   HDL 41.00 02/09/2020 1303   CHOLHDL 4 02/09/2020 1303   VLDL 53.0 (H) 02/09/2020 1303   LDLCALC 64 04/15/2017 0841   LDLDIRECT 94.0 02/09/2020 1303   BP Readings from Last 3 Encounters:  04/08/20 136/70  03/11/20 133/69  02/26/20 (!) 156/57    Assessment: Review of patient past medical history, allergies, medications, health status, including review of consultants reports, laboratory and other test data, was performed as part of comprehensive evaluation and provision of chronic care management services.   SDOH:  (Social Determinants of Health) assessments and interventions performed:  SDOH Interventions   Flowsheet Row Most Recent Value  SDOH Interventions   Financial Strain Interventions Other (Comment)  [manufacturer assistance]      CCM Care Plan  Allergies  Allergen Reactions  . Codeine Nausea And Vomiting  . Prednisone Swelling  . Tape Itching and Rash    Please use "paper"  tape Please use "paper" tape    Medications Reviewed Today    Reviewed by [name], RPH-CPP (Pharmacist) on 05/27/20 at 0944  Med List Status: <None>  Medication Order Taking? Sig Documenting Provider Last Dose Status Informant  aspirin 325 MG tablet [id number] Yes Take 325 mg by mouth daily. [provider] Taking Active Self  carvedilol (COREG) 12.5 MG tablet [id number] Yes TAKE 1 TABLET(12.5 MG) BY MOUTH TWICE DAILY WITH MEALS Einar Pheasant, MD Taking Active   Cholecalciferol 25 MCG (1000 UT) tablet [id number] Yes Take 1,000 Units by mouth daily.  [provider] Taking Active Self           Med Note Meda Coffee, Rex Kras   Wed Oct 09, 2015  4:29 PM)    empagliflozin (JARDIANCE) 25 MG TABS tablet [id number] Yes Take 1 tablet (25 mg total) by mouth daily before breakfast. Einar Pheasant, MD Taking Active   glucose blood (ONETOUCH VERIO) test strip [id number] Yes Use to check blood sugars up to three times daily. Einar Pheasant, MD Taking Active   hydrALAZINE (APRESOLINE) 10 MG tablet [id number] Yes TAKE 1 TABLET(10 MG) BY MOUTH THREE TIMES DAILY Einar Pheasant, MD Taking Active   losartan-hydrochlorothiazide [facility]) 100-25 MG tablet [id number] Yes Take 1 tablet by mouth daily. Einar Pheasant, MD Taking Active   meclizine (ANTIVERT) 25 MG tablet [id number] No Take 1 tablet (25 mg total) by mouth 3 (three) times daily as needed for dizziness or nausea.  Patient not taking: No sig reported   Wister,  Gretchen Short, MD Not Taking Active Self           Med Note Darnelle Maffucci, Maralyn Sago Mar 23, 2019  4:12 PM) PRN  mupirocin ointment (BACTROBAN) 2 % [id number] Yes Apply 1 application topically 2 (two) times daily. Einar Pheasant, MD Taking Active   omeprazole (PRILOSEC) 40 MG capsule [id number] Yes TAKE 1 CAPSULE(40 MG) BY MOUTH DAILY Einar Pheasant, MD Taking Active   OneTouch Delica Lancets [id number] MISC [id number] Yes Use to check blood sugars up to 3 times daily. Einar Pheasant, MD  Taking Active   PROAIR HFA 108 [phone] Base) MCG/ACT inhaler [id number] Yes INL 2 INHALATIONS ITL Q 6 H PRF WHZ [provider] Taking Active Self           Med Note Darnelle Maffucci, Terence Googe E   Thu Mar 23, 2019  4:11 PM) PRN  rosuvastatin (CRESTOR) 40 MG tablet [id number] Yes Take 1 tablet (40 mg total) by mouth daily. Einar Pheasant, MD Taking Active   vitamin E 400 UNIT capsule [id number] Yes Take 400 Units by mouth daily. [provider] Taking Active Self          Patient Active Problem List   Diagnosis Date Noted  . Vaginal lesion 04/15/2020  . 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 History of COVID-19 01/07/2020  . Cracked skin on feet 10/01/2019  . History of colon polyps 10/01/2019  . Right groin pain 07/09/2019  . Seroma of breast 03/30/2018  . Abdominal pain 02/27/2018  . Bradycardia 10/18/2017  . 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 OSA (obstructive sleep apnea) 10/18/2017  . COPD (chronic obstructive pulmonary disease) ([place]) 08/26/2017  . PAC (premature atrial contraction) 03/22/2017  . Palpitations 03/22/2017  . Back pain 10/14/2016  . Diabetes mellitus with cardiac complication ([place]) 123456  . Bilateral leg edema 06/04/2016  . Carpal tunnel syndrome 04/12/2016  . Lymphedema 12/28/2015  . Carotid artery stenosis 12/28/2015  . Fullness of breast 12/19/2015  . Musculoskeletal pain 10/18/2015  . S/P spinal surgery 10/18/2015  . 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 Neck pain 10/10/2015  . Hypertensive left ventricular hypertrophy 06/26/2015  . Trigger middle finger of right hand 05/27/2015  . Chest pain 05/08/2015  . SOB (shortness of breath) 05/08/2015  . Trigger finger, right 05/08/2015  . Combined fat and carbohydrate induced hyperlipemia 05/08/2015  . Pressure in head 02/08/2015  . Hypercholesterolemia 01/02/2015  . Breast cancer ([place]) 11/06/2014  . Health care maintenance 11/06/2014  .  Ankle swelling 11/06/2014  . Stress 11/06/2014  . Neck fullness 06/29/2014  . Osteopenia 03/31/2014  . Snoring 11/05/2013  . Skin lesions 11/05/2013  . Light headedness 10/30/2013  . Abnormal liver function test 08/05/2012  . Thyroid nodule 05/31/2012  . CAD (coronary artery disease) 05/31/2012  . Hypertension 02/24/2012  . Peripheral vascular disease ([place]) 02/24/2012  . GERD (gastroesophageal reflux disease) 02/24/2012    Conditions to be addressed/monitored: CAD, HTN, HLD and DMII  Care Plan : Medication Management  Updates made by [name], RPH-CPP since 05/27/2020 12:00 AM    Problem: Diabetes, Hypertension, Hyperlipidemia     Long-Range Goal: Disease Progression Prevention   This Visit's Progress: On track  Recent Progress: On track  Priority: High  Note:   Current Barriers:  . Unable to independently afford treatment regimen . Unable to maintain control of diabetes, hyperlipidemia  Pharmacist Clinical Goal(s):  [name] Over the next 90 days, patient will  verbalize ability to afford treatment regimen. . Over the next 90 days, patient will achieve adherence to monitoring guidelines and medication adherence to achieve therapeutic efficacy. . Over the next 90 days, patient will achieve control of diabetes and hyperlipidemia as evidenced by improvement in lab work through collaboration with PharmD and provider.   Interventions: . 1:1 collaboration with Einar Pheasant, MD regarding development and update of comprehensive plan of care as evidenced by provider attestation and co-signature . Inter-disciplinary care team collaboration (see longitudinal plan of care) . Comprehensive medication review performed; medication list updated in electronic medical record  Health Maintenance: . Extensive discussion regarding COVID vaccination benefits. Reviewed that she is >90 days out from [place] diagnosis/mAB tx, so she can receive vaccination. Discussed her misgivings. Encouraged  to call her local pharmacy to schedule vaccination appointment. Patient notes that she will consider.  . Reviewed need for yearly eye exam. Patient notes she has an appointment scheduled in March. . Notes that vaginal lesion has completely resolved.   Diabetes: . Uncontrolled; current treatment: Jardiance 25 mg daily  o Hx intolerance to metformin, loose stools . Current glucose readings: fasting glucose: 120-125s, reports occasional after meal readings ~120s . Denies hypoglycemic symptoms . Continue current regimen. A1c due with next PCP visit . Re-enrolled for Jardiance assistance through [employer]. Reviewed refill procedure  Hypertension: . Controlled; current treatment: hydralazine 10 mg TID, losartan/HCTZ 100/25 mg daily, carvedilol 6.25 mg BID . Home BP readings: checks periodically, notes she remembers that all readings have been "good" with SBP in 120s . Recommended to continue current regimen along with periodic BP monitoring  Hyperlipidemia, vascular disease . Uncontrolled, though anticipated to be improved; current treatment: rosuvastatin 40 mg daily; denies any concerns since increasing dose . Antiplatelet regimen: aspirin 325 mg daily (recommended by vascular) . Continue current regimen. Updated lab work due with next PCP appointment.   GERD: . Controlled per patient report; current regimen: omeprazole 40 mg daily . Continue current regimen at this time  Patient Goals/Self-Care Activities . Over the next 90 days, patient will:  - take medications as prescribed check blood glucose BID, document, and provide at future appointments check blood pressure periodically, document, and provide at future appointments collaborate with provider on medication access solutions  Follow Up Plan: Telephone follow up appointment with care management team member scheduled for:~ 10 weeks      Medication Assistance: Jardianceobtained through [place] medication assistance program.   Enrollment ends 05/10/21  Follow Up:  Patient agrees to Care Plan and Follow-up.  Plan: Telephone follow up appointment with care management team member scheduled for:  ~ 10 weeks  Catie Darnelle Maffucci, PharmD, [place], [place] Clinical Pharmacist [employer] at [employer] [phone]

## 2020-06-11 ENCOUNTER — Telehealth (INDEPENDENT_AMBULATORY_CARE_PROVIDER_SITE_OTHER): Payer: Medicare Other | Admitting: Internal Medicine

## 2020-06-11 DIAGNOSIS — K219 Gastro-esophageal reflux disease without esophagitis: Secondary | ICD-10-CM

## 2020-06-11 DIAGNOSIS — E7800 Pure hypercholesterolemia, unspecified: Secondary | ICD-10-CM | POA: Diagnosis not present

## 2020-06-11 DIAGNOSIS — E1159 Type 2 diabetes mellitus with other circulatory complications: Secondary | ICD-10-CM

## 2020-06-11 DIAGNOSIS — Z86010 Personal history of colonic polyps: Secondary | ICD-10-CM | POA: Diagnosis not present

## 2020-06-11 DIAGNOSIS — I739 Peripheral vascular disease, unspecified: Secondary | ICD-10-CM | POA: Diagnosis not present

## 2020-06-11 DIAGNOSIS — J449 Chronic obstructive pulmonary disease, unspecified: Secondary | ICD-10-CM | POA: Diagnosis not present

## 2020-06-11 DIAGNOSIS — F439 Reaction to severe stress, unspecified: Secondary | ICD-10-CM | POA: Diagnosis not present

## 2020-06-11 DIAGNOSIS — E041 Nontoxic single thyroid nodule: Secondary | ICD-10-CM | POA: Diagnosis not present

## 2020-06-11 DIAGNOSIS — I25119 Atherosclerotic heart disease of native coronary artery with unspecified angina pectoris: Secondary | ICD-10-CM | POA: Diagnosis not present

## 2020-06-11 DIAGNOSIS — I10 Essential (primary) hypertension: Secondary | ICD-10-CM

## 2020-06-11 NOTE — Progress Notes (Signed)
[name], female   DOB: 1945/10/12, 75 y.o.   MRN: [id number]   Virtual Visit via video Note  This visit type was conducted due to national recommendations for restrictions regarding the COVID-19 pandemic (e.g. social distancing).  This format is felt to be most appropriate for this patient at this time.  All issues noted in this document were discussed and addressed.  No physical exam was performed (except for noted visual exam findings with Video Visits).   I connected with Lindsey Bentley by a video enabled telemedicine application and verified that I am speaking with the correct person using two identifiers. Location patient: home Location provider: work  Persons participating in the virtual visit: patient, provider  The limitations, risks, security and privacy concerns of performing an evaluation and management service by video and the availability of in person appointments have been discussed.  It has also been discussed with the patient that there may be a patient responsible charge related to this service. The patient expressed understanding and agreed to proceed.   Reason for visit: follow up appt  HPI: Follow up regarding her blood pressure, blood sugars and cholesterol. She reports she has been doing relatively well.  She does report she was out in the cold dry air.  Inhaler helped.  Reports no chest pain or sob.  No acid reflux.  No abdominal pain.  Bowels better - normal.  Eating.  Has increased fiber - helped.  Taking jardiance.  States blood sugars - doing well - 119-120s.  Blood pressures averaging 120-130/60-70s.  Weight down some - 195 lbs.  Handling stress.    ROS: See pertinent positives and negatives per HPI.  Past Medical History:  Diagnosis Date  . Arthritis   . Breast cancer ([place])   . Breast cancer, left ([place]) 2016   LT LUMPECTOMY - TI, NO, MO - IDC, ER/PR pos, Her 2 neg, Rad tx's.   . Carotid artery occlusion   . Carotid artery occlusion   .  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 CHF (congestive heart failure) ([place])   . COPD (chronic obstructive pulmonary disease) ([place])   . Coronary artery disease   . Depression   . Diffuse cystic mastopathy 2013  . DVT (deep venous thrombosis) ([place])   . Dyspnea   . Elevated TSH   . Family history of adverse reaction to anesthesia    Pt stated that son had a seizure with a combination of anesthesia and pain medication."  . Fatty liver   . GERD (gastroesophageal reflux disease)   . Glaucoma   . High cholesterol   . History of chicken pox   . Hyperglycemia   . Hyperlipidemia   . Hypertension   . LVH (left ventricular hypertrophy)   . PAC (premature atrial contraction)   . Palpitations   . Peripheral vascular disease ([place])   . Personal history of radiation therapy 2016   BREAST CA  . Pneumonia March 2014  . Skin cancer 2013  . Sleep apnea    wears CPAP set at 2.5  . Wears glasses     Past Surgical History:  Procedure Laterality Date  . BACK SURGERY  1986   ruptured disc  . BREAST BIOPSY Left 2008   NEG  . BREAST BIOPSY Left 08-20-14   POS  . BREAST BIOPSY Right 2007   NEG  . BREAST EXCISIONAL BIOPSY Left 1984   NEG  . 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 BREAST SURGERY Left 09/05/2014   T1a,N0; 5 mm  ER/PR positive. HER2 negative.  Wide excision with SLN biopsy.  MammoSite radiation  . CARPAL TUNNEL RELEASE    . CHOLECYSTECTOMY  2006  . COLONOSCOPY  2010   Dr. Jamal Collin  . COLONOSCOPY WITH PROPOFOL N/A 05/17/2017   Procedure: COLONOSCOPY WITH PROPOFOL;  Surgeon: Manya Silvas, MD;  [facility]: [facility] ENDOSCOPY;  Service: Endoscopy;  Laterality: N/A;  . DILATION AND CURETTAGE OF UTERUS    . ESOPHAGOGASTRODUODENOSCOPY (EGD) WITH PROPOFOL N/A 05/17/2017   Procedure: ESOPHAGOGASTRODUODENOSCOPY (EGD) WITH PROPOFOL;  Surgeon: Manya Silvas, MD;  Location[facility] ENDOSCOPY;  Service: Endoscopy;  Laterality: N/A;  . LAPAROTOMY FOR REMOVAL TUMOR  LUMBAR PLEXES    . Leg stent  2011  . MOLE REMOVAL  2013   15 removed  . POSTERIOR CERVICAL LAMINECTOMY Left 10/15/2015   Procedure: Left Cervical four- five Hemilaminectomy/Remove mass;  Surgeon: Leeroy Cha, MD;  Location: [facility] NEURO ORS;  Service: Neurosurgery;  Laterality: Left;  Left C4-5 Hemilaminectomy/Remove mass    Family History  Problem Relation Age of Onset  . Cancer Father        Prostate  . Diabetes Father   . Cerebrovascular Accident Father   . Hypertension Mother   . AAA (abdominal aortic aneurysm) Mother   . Hyperlipidemia Other        Parent  . Miscarriages / Stillbirths Other        Parent  . Hypertension Other        parent  . Heart disease Other        Parent  . Breast cancer Maternal Aunt 47    SOCIAL HX: reviewed.    Current Outpatient Medications:  .  acetaminophen (TYLENOL) 650 MG CR tablet, Take 650 mg by mouth every 8 (eight) hours as needed for pain., Disp: , Rfl:  .  aspirin 325 MG tablet, Take 325 mg by mouth daily., Disp: , Rfl:  .  carvedilol (COREG) 12.5 MG tablet, TAKE 1 TABLET(12.5 MG) BY MOUTH TWICE DAILY WITH MEALS, Disp: 180 tablet, Rfl: 1 .  Cholecalciferol 25 MCG (1000 UT) tablet, Take 1,000 Units by mouth daily. , Disp: , Rfl:  .  empagliflozin (JARDIANCE) 25 MG TABS tablet, Take 1 tablet (25 mg total) by mouth daily before breakfast., Disp: 90 tablet, Rfl: 3 .  glucose blood (ONETOUCH VERIO) test strip, Use to check blood sugars up to three times daily., Disp: 300 each, Rfl: 3 .  hydrALAZINE (APRESOLINE) 10 MG tablet, TAKE 1 TABLET(10 MG) BY MOUTH THREE TIMES DAILY, Disp: 270 tablet, Rfl: 1 .  losartan-hydrochlorothiazide (HYZAAR) 100-25 MG tablet, Take 1 tablet by mouth daily., Disp: 90 tablet, Rfl: 1 .  meclizine (ANTIVERT) 25 MG tablet, Take 1 tablet (25 mg total) by mouth 3 (three) times daily as needed for dizziness or nausea. (Patient not taking: No sig reported), Disp: 20 tablet, Rfl: 0 .  mupirocin ointment (BACTROBAN) 2 %,  Apply 1 application topically 2 (two) times daily., Disp: 22 g, Rfl: 0 .  omeprazole (PRILOSEC) 40 MG capsule, TAKE 1 CAPSULE(40 MG) BY MOUTH DAILY, Disp: 90 capsule, Rfl: 0 .  OneTouch Delica Lancets 20U MISC, Use to check blood sugars up to 3 times daily., Disp: 300 each, Rfl: 3 .  PROAIR HFA 108 (90 Base) MCG/ACT inhaler, INL 2 INHALATIONS ITL Q 6 H PRF WHZ, Disp: , Rfl: 1 .  rosuvastatin (CRESTOR) 40 MG tablet, Take 1 tablet (40 mg total) by  mouth daily., Disp: 90 tablet, Rfl: 3 .  vitamin E 400 UNIT capsule, Take 400 Units by mouth daily., Disp: , Rfl:   EXAM:  VITALS per patient if applicable: [id number], 195 lbs  GENERAL: alert, oriented, appears well and in no acute distress  HEENT: atraumatic, conjunttiva clear, no obvious abnormalities on inspection of external nose and ears  NECK: normal movements of the head and neck  LUNGS: on inspection no signs of respiratory distress, breathing rate appears normal, no obvious gross SOB, gasping or wheezing  CV: no obvious cyanosis  PSYCH/NEURO: pleasant and cooperative, no obvious depression or anxiety, speech and thought processing grossly intact  ASSESSMENT AND PLAN:  Discussed the following assessment and plan:  Problem List Items Addressed This Visit    CAD (coronary artery disease)    No pain.  Followed by cardiology.  Continue risk factor modification.  Continue crestor.        COPD (chronic obstructive pulmonary disease) (HCC)    Breathing stable.       Diabetes mellitus with cardiac complication (HCC)    On jardiance.  Doing well on this medication.  Blood sugars as outlined.  Appear to be doing better.  Follow met b anda 1c.  Low carb diet and exercise.        Relevant Orders   Hemoglobin A1c   Microalbumin / creatinine urine ratio   GERD (gastroesophageal reflux disease)    No upper symptoms reported.  On prilosec.       History of colon polyps    Last colonoscopy 05/2017 - Dr Tiffany Kocher.  Pre pt, recommended  f/u in 3 years. Due f/u.        Hypercholesterolemia    On crestor.  Low cholesterol diet and exercise.  Follow lipid panel and liver function tests.        Relevant Orders   Lipid panel   Hepatic function panel   Hypertension    Blood pressures as outlined.  Doing well.  Continue losartan/hctz, coreg and hydralazine.  Follow pressures.  Follow metabolic panel.       Relevant Orders   Basic metabolic panel   Peripheral vascular disease ([place])    Followed by [facility].  Evaluated in 04/2018.  Was recommended f/u in 2 years.  Continue aspirin and crestor.        Stress    Overall appears to be handling things well.  Follow.       Thyroid nodule    Evaluated by Dr Gabriel Carina.  Biopsy negative.  Follow tsh.        Relevant Orders   TSH       I discussed the assessment and treatment plan with the patient. The patient was provided an opportunity to ask questions and all were answered. The patient agreed with the plan and demonstrated an understanding of the instructions.   The patient was advised to call back or seek an in-person evaluation if the symptoms worsen or if the condition fails to improve as anticipated.    Einar Pheasant, MD

## 2020-06-12 ENCOUNTER — Encounter: Payer: Self-pay | Admitting: Internal Medicine

## 2020-06-12 NOTE — Assessment & Plan Note (Signed)
Evaluated by Dr Solum.  Biopsy negative.  Follow tsh.   

## 2020-06-12 NOTE — Assessment & Plan Note (Signed)
Followed by [facility].  Evaluated in 04/2018.  Was recommended f/u in 2 years.  Continue aspirin and crestor.

## 2020-06-12 NOTE — Assessment & Plan Note (Signed)
Blood pressures as outlined.  Doing well.  Continue losartan/hctz, coreg and hydralazine.  Follow pressures.  Follow metabolic panel.

## 2020-06-12 NOTE — Assessment & Plan Note (Signed)
No pain.  Followed by cardiology.  Continue risk factor modification.  Continue crestor.

## 2020-06-12 NOTE — Assessment & Plan Note (Signed)
No upper symptoms reported. On prilosec.  

## 2020-06-12 NOTE — Assessment & Plan Note (Signed)
Overall appears to be handling things well.  Follow.  ?

## 2020-06-12 NOTE — Assessment & Plan Note (Signed)
On crestor.  Low cholesterol diet and exercise.  Follow lipid panel and liver function tests.   

## 2020-06-12 NOTE — Assessment & Plan Note (Signed)
Breathing stable.

## 2020-06-12 NOTE — Assessment & Plan Note (Signed)
On jardiance.  Doing well on this medication.  Blood sugars as outlined.  Appear to be doing better.  Follow met b and a1c.  Low carb diet and exercise.   

## 2020-06-12 NOTE — Assessment & Plan Note (Signed)
Last colonoscopy 05/2017 - Dr Tiffany Kocher.  Pre pt, recommended f/u in 3 years. Due f/u.

## 2020-06-27 DIAGNOSIS — H6123 Impacted cerumen, bilateral: Secondary | ICD-10-CM | POA: Diagnosis not present

## 2020-06-27 DIAGNOSIS — R221 Localized swelling, mass and lump, neck: Secondary | ICD-10-CM | POA: Diagnosis not present

## 2020-08-04 NOTE — Progress Notes (Deleted)
L 

## 2020-08-05 ENCOUNTER — Ambulatory Visit (INDEPENDENT_AMBULATORY_CARE_PROVIDER_SITE_OTHER): Payer: Medicare Other | Admitting: Pharmacist

## 2020-08-05 DIAGNOSIS — I10 Essential (primary) hypertension: Secondary | ICD-10-CM | POA: Diagnosis not present

## 2020-08-05 DIAGNOSIS — E7800 Pure hypercholesterolemia, unspecified: Secondary | ICD-10-CM | POA: Diagnosis not present

## 2020-08-05 DIAGNOSIS — I739 Peripheral vascular disease, unspecified: Secondary | ICD-10-CM

## 2020-08-05 DIAGNOSIS — E1159 Type 2 diabetes mellitus with other circulatory complications: Secondary | ICD-10-CM | POA: Diagnosis not present

## 2020-08-05 NOTE — Chronic Care Management (AMB) (Addendum)
Chronic Care Management Pharmacy Note  08/05/2020 Name:  Lindsey Bentley MRN:  [id number] DOB:  [birth year]  Subjective: Lindsey Bentley is an 75 y.o. year old female who is a primary patient of Einar Pheasant, MD.  The CCM team was consulted for assistance with disease management and care coordination needs.    Engaged with patient by telephone for follow up visit in response to provider referral for pharmacy case management and/or care coordination services.   Consent to Services:  The patient was given information about Chronic Care Management services, agreed to services, and gave verbal consent prior to initiation of services.  Please see initial visit note for detailed documentation.   Patient Care Team: Einar Pheasant, MD as PCP - General (Internal Medicine) Christene Lye, MD (General Surgery) Einar Pheasant, MD (Internal Medicine) [name], RPH-CPP as Pharmacist (Pharmacist)  Recent office visits: 06/11/20 w/PCP: BG 119-120s; BP 120-130/60-70s; labs ordered with next visit; no medication changes  Recent consult visits: None since last visit  Hospital visits: None in previous 6 months  Objective:  Lab Results  Component Value Date   CREATININE 0.95 02/09/2020   CREATININE 1.12 (H) 12/14/2019   CREATININE 0.98 09/06/2019    Lab Results  Component Value Date   HGBA1C 7.3 (H) 02/09/2020   Last diabetic Eye exam: No results found for: HMDIABEYEEXA  Last diabetic Foot exam:  Lab Results  Component Value Date/Time   HMDIABFOOTEX on my exam 06/20/2018 12:00 AM        Component Value Date/Time   CHOL 159 02/09/2020 1303   TRIG 265.0 (H) 02/09/2020 1303   HDL 41.00 02/09/2020 1303   CHOLHDL 4 02/09/2020 1303   VLDL 53.0 (H) 02/09/2020 1303   LDLCALC 64 04/15/2017 0841   LDLDIRECT 94.0 02/09/2020 1303    Hepatic Function Latest Ref Rng & Units 02/09/2020 09/06/2019 05/17/2019  Total Protein 6.0 - 8.3 g/dL 7.3 7.2 7.2  Albumin 3.5 - 5.2 g/dL  4.5 4.3 4.2  AST 0 - 37 U/L _0 ALT 0 - 35 U/L _1 Alk Phosphatase 39 - 117 U/L 61 61 59  Total Bilirubin 0.2 - 1.2 mg/dL 0.9 0.6 0.4  Bilirubin, Direct 0.0 - 0.3 mg/dL 0.1 0.1 0.1    Lab Results  Component Value Date/Time   TSH 4.34 05/17/2019 08:06 AM   TSH 4.93 (H) 01/27/2019 08:25 AM   FREET4 0.67 06/07/2012 08:26 AM    CBC Latest Ref Rng & Units 02/09/2020 12/14/2019 01/27/2019  WBC 4.0 - 10.5 K/uL 6.6 4.8 5.5  Hemoglobin 12.0 - 15.0 g/dL 12.7 12.5 12.2  Hematocrit 36.0 - 46.0 % 37.1 36.2 35.5(L)  Platelets 150.0 - 400.0 K/uL 191.0 150 180.0    Lab Results  Component Value Date/Time   VD25OH 20.87 (L) 06/27/2014 08:25 AM    Clinical ASCVD: Yes - PVD The 10-year ASCVD risk score Mikey Bussing DC Jr., et al., 2013) is: 33.7%   Values used to calculate the score:     Age: 20 years     Sex: Female     Is Non-Hispanic African American: No     Diabetic: Yes     Tobacco smoker: No     Systolic Blood Pressure: 409 mmHg     Is BP treated: Yes     HDL Cholesterol: 41 mg/dL     Total Cholesterol: 159 mg/dL    Social History   Tobacco Use  Smoking Status Never Smoker  Smokeless  Tobacco Never Used   BP Readings from Last 3 Encounters:  06/11/20 123/70  04/08/20 136/70  03/11/20 133/69   Pulse Readings from Last 3 Encounters:  04/08/20 60  03/11/20 67  02/26/20 (!) 57   Wt Readings from Last 3 Encounters:  06/11/20 195 lb (88.5 kg)  04/08/20 203 lb 6.4 oz (92.3 kg)  03/11/20 202 lb (91.6 kg)    Assessment: Review of patient past medical history, allergies, medications, health status, including review of consultants reports, laboratory and other test data, was performed as part of comprehensive evaluation and provision of chronic care management services.   SDOH:  (Social Determinants of Health) assessments and interventions performed:  SDOH Interventions   Flowsheet Row Most Recent Value  SDOH Interventions   Financial Strain Interventions Other (Comment)   [manufacturer assistance]      CCM Care Plan  Allergies  Allergen Reactions  . Codeine Nausea And Vomiting  . Prednisone Swelling  . Tape Itching and Rash    Please use "paper" tape Please use "paper" tape    Medications Reviewed Today    Reviewed by Avie Arenas, RPH (Pharmacist) on 08/05/20 at [phone]  Med List Status: <None>  Medication Order Taking? Sig Documenting Provider Last Dose Status Informant  acetaminophen (TYLENOL) 650 MG CR tablet [id number] Yes Take 650 mg by mouth every 8 (eight) hours as needed for pain. [provider] Taking Active   aspirin 325 MG tablet [id number] Yes Take 325 mg by mouth daily. [provider] Taking Active Self  carvedilol (COREG) 12.5 MG tablet [id number] Yes TAKE 1 TABLET(12.5 MG) BY MOUTH TWICE DAILY WITH MEALS Einar Pheasant, MD Taking Active   Cholecalciferol 25 MCG (1000 UT) tablet [id number] Yes Take 1,000 Units by mouth daily.  [provider] Taking Active Self           Med Note Meda Coffee, Rex Kras   Wed Oct 09, 2015  4:29 PM)    empagliflozin (JARDIANCE) 25 MG TABS tablet [id number] Yes Take 1 tablet (25 mg total) by mouth daily before breakfast. Einar Pheasant, MD Taking Active   glucose blood (ONETOUCH VERIO) test strip [id number] Yes Use to check blood sugars up to three times daily. Einar Pheasant, MD Taking Active   hydrALAZINE (APRESOLINE) 10 MG tablet [id number] Yes TAKE 1 TABLET(10 MG) BY MOUTH THREE TIMES DAILY Einar Pheasant, MD Taking Active   losartan-hydrochlorothiazide [place]) 100-25 MG tablet [id number] Yes Take 1 tablet by mouth daily. Einar Pheasant, MD Taking Active   meclizine (ANTIVERT) 25 MG tablet [id number] No Take 1 tablet (25 mg total) by mouth 3 (three) times daily as needed for dizziness or nausea.  Patient not taking: No sig reported   Paulette Blanch, MD Not Taking Active            Med Note Darnelle Maffucci, Maralyn Sago Mar 23, 2019  4:12 PM) PRN  mupirocin ointment (BACTROBAN) 2 % [id number]  No Apply 1 application topically 2 (two) times daily.  Patient not taking: Reported on 08/05/2020   Einar Pheasant, MD Not Taking Active   omeprazole (PRILOSEC) 40 MG capsule [id number] Yes TAKE 1 CAPSULE(40 MG) BY MOUTH DAILY Einar Pheasant, MD Taking Active   OneTouch Delica Lancets 06Y MISC [id number]  Use to check blood sugars up to 3 times daily. Einar Pheasant, MD  Active   PROAIR HFA 108 [phone] Base) MCG/ACT inhaler [id number] No INL 2 INHALATIONS ITL Q 6 H PRF WHZ  Patient not taking: Reported on  08/05/2020   [provider] Not Taking Active Self           Med Note Darnelle Maffucci, Arville Lime   Thu Mar 23, 2019  4:11 PM) PRN  rosuvastatin (CRESTOR) 40 MG tablet [id number] Yes Take 1 tablet (40 mg total) by mouth daily. Einar Pheasant, MD Taking Active   vitamin E 400 UNIT capsule [id number] Yes Take 400 Units by mouth daily. [provider] Taking Active Self          Patient Active Problem List   Diagnosis Date Noted  . Vaginal lesion 04/15/2020  . 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 History of COVID-19 01/07/2020  . Cracked skin on feet 10/01/2019  . History of colon polyps 10/01/2019  . Right groin pain 07/09/2019  . Seroma of breast 03/30/2018  . Abdominal pain 02/27/2018  . Bradycardia 10/18/2017  . 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 OSA (obstructive sleep apnea) 10/18/2017  . COPD (chronic obstructive pulmonary disease) ([place]) 08/26/2017  . PAC (premature atrial contraction) 03/22/2017  . Palpitations 03/22/2017  . Back pain 10/14/2016  . Diabetes mellitus with cardiac complication ([place]) 46/28/6381  . Bilateral leg edema 06/04/2016  . Carpal tunnel syndrome 04/12/2016  . Lymphedema 12/28/2015  . Carotid artery stenosis 12/28/2015  . Fullness of breast 12/19/2015  . Musculoskeletal pain 10/18/2015  . S/P spinal surgery 10/18/2015  . 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 Neck pain 10/10/2015  .  Hypertensive left ventricular hypertrophy 06/26/2015  . Trigger middle finger of right hand 05/27/2015  . Chest pain 05/08/2015  . SOB (shortness of breath) 05/08/2015  . Trigger finger, right 05/08/2015  . Combined fat and carbohydrate induced hyperlipemia 05/08/2015  . Pressure in head 02/08/2015  . Hypercholesterolemia 01/02/2015  . Breast cancer ([place]) 11/06/2014  . Health care maintenance 11/06/2014  . Ankle swelling 11/06/2014  . Stress 11/06/2014  . Neck fullness 06/29/2014  . Osteopenia 03/31/2014  . Snoring 11/05/2013  . Skin lesions 11/05/2013  . Light headedness 10/30/2013  . Abnormal liver function test 08/05/2012  . Thyroid nodule 05/31/2012  . CAD (coronary artery disease) 05/31/2012  . Hypertension 02/24/2012  . Peripheral vascular disease ([place]) 02/24/2012  . GERD (gastroesophageal reflux disease) 02/24/2012    Immunization History  Administered Date(s) Administered  . Pneumococcal Conjugate-13 12/17/2016  . Pneumococcal Polysaccharide-23 07/26/2012  . Td 05/11/2004    Conditions to be addressed/monitored: CAD, HTN, HLD, COPD, DMII and PVD  Care Plan : Medication Management  Updates made by Nwogu, Ivy A, RPH since 08/05/2020 12:00 AM    Problem: Diabetes, Hypertension, Hyperlipidemia     Long-Range Goal: Disease Progression Prevention   This Visit's Progress: On track  Recent Progress: On track  Priority: High  Note:   Current Barriers:  . Unable to independently afford treatment regimen . Unable to maintain control of diabetes, hyperlipidemia  Pharmacist Clinical Goal(s):  [name] Over the next 90 days, patient will verbalize ability to afford treatment regimen. . Over the next 90 days, patient will achieve adherence to monitoring guidelines and medication adherence to achieve therapeutic efficacy. . Over the next 90 days, patient will achieve control of diabetes and hyperlipidemia as evidenced by improvement in lab work through collaboration with PharmD  and provider.   Interventions: . 1:1 collaboration with Einar Pheasant, MD regarding development and update of comprehensive plan of care as evidenced by provider attestation and co-signature . Inter-disciplinary care team collaboration (see longitudinal plan of care) .  Comprehensive medication review performed; medication list updated in electronic medical record  Diabetes: . Uncontrolled; current treatment: Jardiance 25 mg daily  o Hx intolerance to metformin, loose stools . Current glucose readings: fasting glucose: 110-121, reports occasional after meal readings: none today . Denies hypoglycemic symptoms . Diet: minimizing food portion size. Endorses continued weight loss.  . Continue current regimen. A1c due with next PCP visit  Hypertension: . Controlled per patient; current treatment: hydralazine 10 mg TID, losartan/HCTZ 100/25 mg daily, carvedilol 12.5 mg BID . Home BP readings: checks periodically, notes she remembers that all readings have been "good" with SBP in 120-130s . Recommended to continue current regimen along with periodic BP monitoring  Hyperlipidemia, vascular disease . Uncontrolled, though anticipated to be improved; current treatment: rosuvastatin 40 mg daily . Antiplatelet regimen: aspirin 325 mg daily (recommended by vascular) . Continue current regimen. Updated lab work due with next PCP appointment.   GERD: . Controlled per patient report; current regimen: omeprazole 40 mg daily . Continue current regimen at this time  Patient Goals/Self-Care Activities . Over the next 90 days, patient will:  - take medications as prescribed check blood glucose BID, document, and provide at future appointments check blood pressure periodically, document, and provide at future appointments collaborate with provider on medication access solutions  Follow Up Plan: Telephone follow up appointment with care management team member scheduled for:~ 8 weeks      Medication  Assistance: Jardiance obtained through [place] medication assistance program.  Enrollment ends 05/10/21  Patient's preferred pharmacy is:  [id number] [address], [place] [address] [address] Phone: [phone] Fax: [phone]  Follow Up:  Patient agrees to Care Plan and Follow-up.  Plan: Telephone follow up appointment with care management team member scheduled for:  ~8 weeks  Lorel Monaco, PharmD, BCPS PGY2 [place]   I was present for this visit and agree with the documentation by the resident as above.   Catie Darnelle Maffucci, PharmD, [place], [place] Clinical Pharmacist [employer] at [place]

## 2020-08-05 NOTE — Patient Instructions (Signed)
  Visit Information  PATIENT GOALS: Goals Addressed              This Visit's Progress   .  Medication Monitoring (pt-stated)        Patient Goals/Self-Care Activities . Over the next 90 days, patient will:  - take medications as prescribed check blood glucose BID, document, and provide at future appointments check blood pressure periodically, document, and provide at future appointments collaborate with provider on medication access solutions.       Patient verbalizes understanding of instructions provided today and agrees to view in [place].   Telephone follow up appointment with care management team member scheduled for: ~8 weeks  Lorel Monaco, PharmD, BCPS PGY2 [place]

## 2020-08-08 ENCOUNTER — Other Ambulatory Visit: Payer: Self-pay | Admitting: Otolaryngology

## 2020-08-08 DIAGNOSIS — D2321 Other benign neoplasm of skin of right ear and external auricular canal: Secondary | ICD-10-CM | POA: Diagnosis not present

## 2020-08-12 LAB — SURGICAL PATHOLOGY

## 2020-08-20 ENCOUNTER — Other Ambulatory Visit: Payer: Self-pay | Admitting: Internal Medicine

## 2020-08-21 DIAGNOSIS — G4733 Obstructive sleep apnea (adult) (pediatric): Secondary | ICD-10-CM | POA: Diagnosis not present

## 2020-09-23 ENCOUNTER — Ambulatory Visit (INDEPENDENT_AMBULATORY_CARE_PROVIDER_SITE_OTHER): Payer: Medicare Other | Admitting: Pharmacist

## 2020-09-23 DIAGNOSIS — I10 Essential (primary) hypertension: Secondary | ICD-10-CM

## 2020-09-23 DIAGNOSIS — E7800 Pure hypercholesterolemia, unspecified: Secondary | ICD-10-CM | POA: Diagnosis not present

## 2020-09-23 DIAGNOSIS — E1159 Type 2 diabetes mellitus with other circulatory complications: Secondary | ICD-10-CM | POA: Diagnosis not present

## 2020-09-23 NOTE — Patient Instructions (Addendum)
Lindsey Bentley,   Please reach out if you need any support from [place] or Dr. Nicki Reaper.   I do recommend occasional home blood sugar and blood pressure monitoring, just to make sure things don't get your of control with either your diabetes or your blood pressure.   I know things are crazy at the moment, but please scheduled your yearly diabetic eye exam at your convenience.   Take care,   Catie Darnelle Maffucci, PharmD [phone]  Visit Information  PATIENT GOALS: Goals Addressed              This Visit's Progress   .  Medication Monitoring (pt-stated)        Patient Goals/Self-Care Activities . Over the next 90 days, patient will:  - take medications as prescribed check blood glucose BID, document, and provide at future appointments check blood pressure periodically, document, and provide at future appointments. collaborate with provider on medication access solutions.       Patient verbalizes understanding of instructions provided today and agrees to view in [place].   Telephone follow up appointment with care management team member scheduled for: ~ 8 weeks  Lorel Monaco, PharmD, [place]

## 2020-09-23 NOTE — Chronic Care Management (AMB) (Addendum)
Chronic Care Management Pharmacy Note  09/23/2020 Name:  Lindsey Bentley MRN:  [id number] DOB:  31-Mar-1946  Subjective: [name] is an 75 y.o. year old female who is a primary patient of Einar Pheasant, MD.  The CCM team was consulted for assistance with disease management and care coordination needs.    Engaged with patient by telephone for follow up visit in response to provider referral for pharmacy case management and/or care coordination services. Did not speak for long today as patient reported being worn out from care for her husband.   Consent to Services:  The patient was given information about Chronic Care Management services, agreed to services, and gave verbal consent prior to initiation of services.  Please see initial visit note for detailed documentation.   Patient Care Team: Einar Pheasant, MD as PCP - General (Internal Medicine) Christene Lye, MD (General Surgery) Einar Pheasant, MD (Internal Medicine) [name], RPH-CPP as Pharmacist (Pharmacist)  Recent office visits: None since last visit  Recent consult visits: None since last visit  Hospital visits: None in previous 6 months  Objective:  Lab Results  Component Value Date   CREATININE 0.95 02/09/2020   CREATININE 1.12 (H) 12/14/2019   CREATININE 0.98 09/06/2019    Lab Results  Component Value Date   HGBA1C 7.3 (H) 02/09/2020   Last diabetic Eye exam: No results found for: HMDIABEYEEXA  Last diabetic Foot exam:  Lab Results  Component Value Date/Time   HMDIABFOOTEX on my exam 06/20/2018 12:00 AM        Component Value Date/Time   CHOL 159 02/09/2020 1303   TRIG 265.0 (H) 02/09/2020 1303   HDL 41.00 02/09/2020 1303   CHOLHDL 4 02/09/2020 1303   VLDL 53.0 (H) 02/09/2020 1303   LDLCALC 64 04/15/2017 0841   LDLDIRECT 94.0 02/09/2020 1303    Hepatic Function Latest Ref Rng & Units 02/09/2020 09/06/2019 05/17/2019  Total Protein 6.0 - 8.3 g/dL 7.3 7.2 7.2  Albumin  3.5 - 5.2 g/dL 4.5 4.3 4.2  AST 0 - 37 U/L $Remo'19 15 14  'rFPXg$ ALT 0 - 35 U/L $Remo'25 21 23  'pkKIH$ Alk Phosphatase 39 - 117 U/L 61 61 59  Total Bilirubin 0.2 - 1.2 mg/dL 0.9 0.6 0.4  Bilirubin, Direct 0.0 - 0.3 mg/dL 0.1 0.1 0.1    Lab Results  Component Value Date/Time   TSH 4.34 05/17/2019 08:06 AM   TSH 4.93 (H) 01/27/2019 08:25 AM   FREET4 0.67 06/07/2012 08:26 AM    CBC Latest Ref Rng & Units 02/09/2020 12/14/2019 01/27/2019  WBC 4.0 - 10.5 K/uL 6.6 4.8 5.5  Hemoglobin 12.0 - 15.0 g/dL 12.7 12.5 12.2  Hematocrit 36.0 - 46.0 % 37.1 36.2 35.5(L)  Platelets 150.0 - 400.0 K/uL 191.0 150 180.0    Lab Results  Component Value Date/Time   VD25OH 20.87 (L) 06/27/2014 08:25 AM    Clinical ASCVD: Yes  The 10-year ASCVD risk score Mikey Bussing DC Jr., et al., 2013) is: 36.8%   Values used to calculate the score:     Age: 62 years     Sex: Female     Is Non-Hispanic African American: No     Diabetic: Yes     Tobacco smoker: No     Systolic Blood Pressure: 503 mmHg     Is BP treated: Yes     HDL Cholesterol: 41 mg/dL     Total Cholesterol: 159 mg/dL    Social History   Tobacco Use  Smoking Status Never Smoker  Smokeless Tobacco Never Used   BP Readings from Last 3 Encounters:  06/11/20 123/70  04/08/20 136/70  03/11/20 133/69   Pulse Readings from Last 3 Encounters:  04/08/20 60  03/11/20 67  02/26/20 (!) 57   Wt Readings from Last 3 Encounters:  06/11/20 195 lb (88.5 kg)  04/08/20 203 lb 6.4 oz (92.3 kg)  03/11/20 202 lb (91.6 kg)    Assessment: Review of patient past medical history, allergies, medications, health status, including review of consultants reports, laboratory and other test data, was performed as part of comprehensive evaluation and provision of chronic care management services.   SDOH:  (Social Determinants of Health) assessments and interventions performed:  SDOH Interventions   Flowsheet Row Most Recent Value  SDOH Interventions   Financial Strain Interventions Other  (Comment)  [manufacturer assistance]      CCM Care Plan  Allergies  Allergen Reactions  . Codeine Nausea And Vomiting  . Prednisone Swelling  . Tape Itching and Rash    Please use "paper" tape Please use "paper" tape    Medications Reviewed Today    Reviewed by Avie Arenas, RPH (Pharmacist) on 09/23/20 at [place] List Status: <None>  Medication Order Taking? Sig Documenting Provider Last Dose Status Informant  acetaminophen (TYLENOL) 650 MG CR tablet [id number] No Take 650 mg by mouth every 8 (eight) hours as needed for pain.  Patient not taking: Reported on 09/23/2020   [provider] Not Taking Active   aspirin 325 MG tablet [id number] Yes Take 325 mg by mouth daily. [provider] Taking Active Self  carvedilol (COREG) 12.5 MG tablet [id number] Yes TAKE 1 TABLET(12.5 MG) BY MOUTH TWICE DAILY WITH MEALS Einar Pheasant, MD Taking Active   Cholecalciferol 25 MCG (1000 UT) tablet [id number] Yes Take 1,000 Units by mouth daily.  [provider] Taking Active Self           Med Note Meda Coffee, Rex Kras   Wed Oct 09, 2015  4:29 PM)    empagliflozin (JARDIANCE) 25 MG TABS tablet [id number] Yes Take 1 tablet (25 mg total) by mouth daily before breakfast. Einar Pheasant, MD Taking Active   glucose blood (ONETOUCH VERIO) test strip [id number]  Use to check blood sugars up to three times daily. Einar Pheasant, MD  Active   hydrALAZINE (APRESOLINE) 10 MG tablet [id number] Yes TAKE 1 TABLET(10 MG) BY MOUTH THREE TIMES DAILY Einar Pheasant, MD Taking Active   losartan-hydrochlorothiazide [facility]) 100-25 MG tablet [id number] Yes TAKE 1 TABLET BY MOUTH DAILY Einar Pheasant, MD Taking Active   meclizine (ANTIVERT) 25 MG tablet [id number] No Take 1 tablet (25 mg total) by mouth 3 (three) times daily as needed for dizziness or nausea.  Patient not taking: No sig reported   Paulette Blanch, MD Not Taking Active            Med Note Darnelle Maffucci, Maralyn Sago Mar 23, 2019  4:12 PM)  PRN  mupirocin ointment (BACTROBAN) 2 % [id number] No Apply 1 application topically 2 (two) times daily.  Patient not taking: No sig reported   Einar Pheasant, MD Not Taking Active   omeprazole (PRILOSEC) 40 MG capsule [id number] Yes TAKE 1 CAPSULE(40 MG) BY MOUTH DAILY Einar Pheasant, MD Taking Active   OneTouch Delica Lancets 00F MISC [id number]  Use to check blood sugars up to 3 times daily. Einar Pheasant, MD  Active   [facility] HFA 108 [phone]) MCG/ACT inhaler [id number]  No INL 2 INHALATIONS ITL Q 6 H PRF WHZ  Patient not taking: No sig reported   [provider] Not Taking Active            Med Note Darnelle Maffucci, CATHERINE E   Thu Mar 23, 2019  4:11 PM) PRN  rosuvastatin (CRESTOR) 40 MG tablet [id number] Yes Take 1 tablet (40 mg total) by mouth daily. Einar Pheasant, MD Taking Active   vitamin E 400 UNIT capsule [id number]  Take 400 Units by mouth daily. [provider]  Active Self          Patient Active Problem List   Diagnosis Date Noted  . Vaginal lesion 04/15/2020  . 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 History of COVID-19 01/07/2020  . Cracked skin on feet 10/01/2019  . History of colon polyps 10/01/2019  . Right groin pain 07/09/2019  . Seroma of breast 03/30/2018  . Abdominal pain 02/27/2018  . Bradycardia 10/18/2017  . 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 OSA (obstructive sleep apnea) 10/18/2017  . COPD (chronic obstructive pulmonary disease) ([place]) 08/26/2017  . PAC (premature atrial contraction) 03/22/2017  . Palpitations 03/22/2017  . Back pain 10/14/2016  . Diabetes mellitus with cardiac complication ([place]) 29/47/6546  . Bilateral leg edema 06/04/2016  . Carpal tunnel syndrome 04/12/2016  . Lymphedema 12/28/2015  . Carotid artery stenosis 12/28/2015  . Fullness of breast 12/19/2015  . Musculoskeletal pain 10/18/2015  . S/P spinal surgery 10/18/2015  . 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  Neck pain 10/10/2015  . Hypertensive left ventricular hypertrophy 06/26/2015  . Trigger middle finger of right hand 05/27/2015  . Chest pain 05/08/2015  . SOB (shortness of breath) 05/08/2015  . Trigger finger, right 05/08/2015  . Combined fat and carbohydrate induced hyperlipemia 05/08/2015  . Pressure in head 02/08/2015  . Hypercholesterolemia 01/02/2015  . Breast cancer ([place]) 11/06/2014  . Health care maintenance 11/06/2014  . Ankle swelling 11/06/2014  . Stress 11/06/2014  . Neck fullness 06/29/2014  . Osteopenia 03/31/2014  . Snoring 11/05/2013  . Skin lesions 11/05/2013  . Light headedness 10/30/2013  . Abnormal liver function test 08/05/2012  . Thyroid nodule 05/31/2012  . CAD (coronary artery disease) 05/31/2012  . Hypertension 02/24/2012  . Peripheral vascular disease ([place]) 02/24/2012  . GERD (gastroesophageal reflux disease) 02/24/2012    Immunization History  Administered Date(s) Administered  . Pneumococcal Conjugate-13 12/17/2016  . Pneumococcal Polysaccharide-23 07/26/2012  . Td 05/11/2004    Conditions to be addressed/monitored: CAD, HTN, HLD, COPD, DMII and PVD  Care Plan : Medication Management  Updates made by Mussa Groesbeck A, RPH since 09/23/2020 12:00 AM    Problem: Diabetes, Hypertension, Hyperlipidemia     Long-Range Goal: Disease Progression Prevention   This Visit's Progress: On track  Recent Progress: On track  Priority: High  Note:   Current Barriers:  . Unable to independently afford treatment regimen . Unable to maintain control of diabetes, hyperlipidemia  Pharmacist Clinical Goal(s):  [name] Over the next 90 days, patient will verbalize ability to afford treatment regimen. . Over the next 90 days, patient will achieve adherence to monitoring guidelines and medication adherence to achieve therapeutic efficacy. . Over the next 90 days, patient will achieve control of diabetes and hyperlipidemia as evidenced by improvement in lab work through  collaboration with PharmD and provider.   Interventions: . 1:1 collaboration with Einar Pheasant, MD regarding development and update of comprehensive plan of care as  evidenced by provider attestation and co-signature . Inter-disciplinary care team collaboration (see longitudinal plan of care) . Comprehensive medication review performed; medication list updated in electronic medical record  Health Maintenance:  Due for foot exam, eye exam. Foot exam with next PCP visit. Will remind about eye exam moving forward.  Diabetes  . Uncontrolled, but could consider more relaxed goal of <7.5%; current treatment: Jardiance 25 mg daily  o Hx intolerance to metformin, loose stools . Current glucose readings: reports not checking recently as she has been focused on her husband's health - he was diagnosed with dementia and experiencing kidney problems, and was recently admitted/discharged from hospital secondary to sepsis . Denies polyuria and polydipsia . Denies hypoglycemic symptoms . Diet: minimizing food portion size . Recommended to continue current regimen. A1c due with next PCP visit. Advised occasional home BG monitoring to ensure continued control  Hypertension: . Controlled per patient; current treatment: hydralazine 10 mg TID, losartan/HCTZ 100/25 mg daily, carvedilol 12.5 mg BID . Home BP readings: not checking recently  . Denies lightheadness, dizziness . Recommended to continue current regimen along with periodic BP monitoring  Hyperlipidemia, vascular disease . Uncontrolled, though anticipated to be improved; current treatment: rosuvastatin 40 mg daily . Antiplatelet regimen: aspirin 325 mg daily (recommended by vascular) . Continue current regimen. Updated lab work due with next PCP appointment.   GERD: . Controlled per patient report; current regimen: omeprazole 40 mg daily . Continue current regimen at this time  Patient Goals/Self-Care Activities . Over the next 90 days,  patient will:  - take medications as prescribed check blood glucose BID, document, and provide at future appointments check blood pressure periodically, document, and provide at future appointments collaborate with provider on medication access solutions  Follow Up Plan: Telephone follow up appointment with care management team member scheduled for:~ 8 weeks      Medication Assistance:  Jardiance obtained through [place] medication assistance program.  Enrollment ends 05/10/21  Patient's preferred pharmacy is:  [id number] [address], [place] [address] [place] [address] Phone: [phone] Fax: [phone]   Follow Up:  Patient agrees to Care Plan and Follow-up.  Plan: Telephone follow up appointment with care management team member scheduled for:  ~8 weeks  Lorel Monaco, PharmD, BCPS PGY2 [place]    I was present for this visit and agree with the documentation by the resident as above.   Catie Darnelle Maffucci, PharmD, [place], [place] Clinical Pharmacist [employer] at [place]

## 2020-10-09 ENCOUNTER — Other Ambulatory Visit: Payer: Self-pay | Admitting: Internal Medicine

## 2020-10-11 ENCOUNTER — Other Ambulatory Visit: Payer: Medicare Other

## 2020-10-15 ENCOUNTER — Ambulatory Visit: Payer: Medicare Other | Admitting: Internal Medicine

## 2020-10-16 ENCOUNTER — Other Ambulatory Visit (INDEPENDENT_AMBULATORY_CARE_PROVIDER_SITE_OTHER): Payer: Medicare Other

## 2020-10-16 ENCOUNTER — Other Ambulatory Visit: Payer: Self-pay

## 2020-10-16 DIAGNOSIS — E041 Nontoxic single thyroid nodule: Secondary | ICD-10-CM | POA: Diagnosis not present

## 2020-10-16 DIAGNOSIS — E1159 Type 2 diabetes mellitus with other circulatory complications: Secondary | ICD-10-CM | POA: Diagnosis not present

## 2020-10-16 DIAGNOSIS — E7800 Pure hypercholesterolemia, unspecified: Secondary | ICD-10-CM

## 2020-10-16 DIAGNOSIS — I10 Essential (primary) hypertension: Secondary | ICD-10-CM | POA: Diagnosis not present

## 2020-10-16 LAB — HEPATIC FUNCTION PANEL
ALT: 18 U/L (ref 0–35)
AST: 15 U/L (ref 0–37)
Albumin: 4.4 g/dL (ref 3.5–5.2)
Alkaline Phosphatase: 53 U/L (ref 39–117)
Bilirubin, Direct: 0.1 mg/dL (ref 0.0–0.3)
Total Bilirubin: 0.7 mg/dL (ref 0.2–1.2)
Total Protein: 7.5 g/dL (ref 6.0–8.3)

## 2020-10-16 LAB — BASIC METABOLIC PANEL
BUN: 29 mg/dL — ABNORMAL HIGH (ref 6–23)
CO2: 26 mEq/L (ref 19–32)
Calcium: 9.6 mg/dL (ref 8.4–10.5)
Chloride: 102 mEq/L (ref 96–112)
Creatinine, Ser: 1.19 mg/dL (ref 0.40–1.20)
GFR: 44.52 mL/min — ABNORMAL LOW (ref >60.00)
Glucose, Bld: 125 mg/dL — ABNORMAL HIGH (ref 70–99)
Potassium: 3.9 mEq/L (ref 3.5–5.1)
Sodium: 139 mEq/L (ref 135–145)

## 2020-10-16 LAB — LIPID PANEL
Cholesterol: 144 mg/dL (ref 0–200)
HDL: 41.3 mg/dL (ref >39.00)
NonHDL: 103.06
Total CHOL/HDL Ratio: 3
Triglycerides: 223 mg/dL — ABNORMAL HIGH (ref 0.0–149.0)
VLDL: 44.6 mg/dL — ABNORMAL HIGH (ref 0.0–40.0)

## 2020-10-16 LAB — TSH: TSH: 4.83 u[IU]/mL — ABNORMAL HIGH (ref 0.35–4.50)

## 2020-10-16 LAB — HEMOGLOBIN A1C: Hgb A1c MFr Bld: 7.3 % — ABNORMAL HIGH (ref 4.6–6.5)

## 2020-10-16 LAB — LDL CHOLESTEROL, DIRECT: Direct LDL: 77 mg/dL

## 2020-10-16 LAB — MICROALBUMIN / CREATININE URINE RATIO
Creatinine,U: 93.9 mg/dL
Microalb Creat Ratio: 1.6 mg/g (ref 0.0–30.0)
Microalb, Ur: 1.5 mg/dL (ref 0.0–1.9)

## 2020-10-28 ENCOUNTER — Telehealth (INDEPENDENT_AMBULATORY_CARE_PROVIDER_SITE_OTHER): Payer: Medicare Other | Admitting: Internal Medicine

## 2020-10-28 VITALS — Ht 65.0 in | Wt 195.0 lb

## 2020-10-28 DIAGNOSIS — F439 Reaction to severe stress, unspecified: Secondary | ICD-10-CM

## 2020-10-28 DIAGNOSIS — R945 Abnormal results of liver function studies: Secondary | ICD-10-CM

## 2020-10-28 DIAGNOSIS — I10 Essential (primary) hypertension: Secondary | ICD-10-CM

## 2020-10-28 DIAGNOSIS — N1831 Chronic kidney disease, stage 3a: Secondary | ICD-10-CM

## 2020-10-28 DIAGNOSIS — Z1231 Encounter for screening mammogram for malignant neoplasm of breast: Secondary | ICD-10-CM

## 2020-10-28 DIAGNOSIS — J449 Chronic obstructive pulmonary disease, unspecified: Secondary | ICD-10-CM

## 2020-10-28 DIAGNOSIS — E7800 Pure hypercholesterolemia, unspecified: Secondary | ICD-10-CM | POA: Diagnosis not present

## 2020-10-28 DIAGNOSIS — R7989 Other specified abnormal findings of blood chemistry: Secondary | ICD-10-CM

## 2020-10-28 DIAGNOSIS — I779 Disorder of arteries and arterioles, unspecified: Secondary | ICD-10-CM

## 2020-10-28 DIAGNOSIS — E1159 Type 2 diabetes mellitus with other circulatory complications: Secondary | ICD-10-CM | POA: Diagnosis not present

## 2020-10-28 DIAGNOSIS — I25119 Atherosclerotic heart disease of native coronary artery with unspecified angina pectoris: Secondary | ICD-10-CM | POA: Diagnosis not present

## 2020-10-28 DIAGNOSIS — Z853 Personal history of malignant neoplasm of breast: Secondary | ICD-10-CM

## 2020-10-28 DIAGNOSIS — K219 Gastro-esophageal reflux disease without esophagitis: Secondary | ICD-10-CM

## 2020-10-28 DIAGNOSIS — E041 Nontoxic single thyroid nodule: Secondary | ICD-10-CM | POA: Diagnosis not present

## 2020-10-28 DIAGNOSIS — I739 Peripheral vascular disease, unspecified: Secondary | ICD-10-CM

## 2020-10-28 DIAGNOSIS — N183 Chronic kidney disease, stage 3 unspecified: Secondary | ICD-10-CM | POA: Insufficient documentation

## 2020-10-28 DIAGNOSIS — Z860100 Personal history of colon polyps, unspecified: Secondary | ICD-10-CM

## 2020-10-28 NOTE — Progress Notes (Signed)
[name], female   DOB: July 26, 1945, 75 y.o.   MRN: [id number]   Virtual Visit via video Note  This visit type was conducted due to national recommendations for restrictions regarding the COVID-19 pandemic (e.g. social distancing).  This format is felt to be most appropriate for this patient at this time.  All issues noted in this document were discussed and addressed.  No physical exam was performed (except for noted visual exam findings with Video Visits).   I connected with Lindsey Bentley by a video enabled telemedicine application and verified that I am speaking with the correct person using two identifiers. Location patient: home Location provider: work  Persons participating in the virtual visit: patient, provider  The limitations, risks, security and privacy concerns of performing an evaluation and management service by telephone and the availability of in person appointments have been discussed.  It has also been discussed with the patient that there may be a patient responsible charge related to this service. The patient expressed understanding and agreed to proceed.   Reason for visit: scheduled follow up.   HPI: Follow up regarding her cholesterol, blood sugar and cholesterol.  Increased stress - husband's health issues.  Discussed.  Has good support.  Does not feel needs any further intervention.  No chest pain or sob reported.  No abdominal pain.  Bowels moving.  Blood sugars in am 113-120.  Blood pressures 120s/68.  Had eye exam 08/2020 - [facility].     ROS: See pertinent positives and negatives per HPI.  Past Medical History:  Diagnosis Date   Arthritis    Breast cancer ([place])    Breast cancer, left ([place]) 2016   LT LUMPECTOMY - TI, NO, MO - IDC, ER/PR pos, Her 2 neg, Rad tx's.    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   CHF (congestive heart failure) (HCC)    COPD (chronic  obstructive pulmonary disease) (HCC)    Coronary artery disease    Depression    Diffuse cystic mastopathy 2013   DVT (deep venous thrombosis) (HCC)    Dyspnea    Elevated TSH    Family history of adverse reaction to anesthesia    Pt stated that son had a seizure with a combination of anesthesia and pain medication."   Fatty liver    GERD (gastroesophageal reflux disease)    Glaucoma    High cholesterol    History of chicken pox    Hyperglycemia    Hyperlipidemia    Hypertension    LVH (left ventricular hypertrophy)    PAC (premature atrial contraction)    Palpitations    Peripheral vascular disease ([place])    Personal history of radiation therapy 2016   BREAST CA   Pneumonia March 2014   Skin cancer 2013   Sleep apnea    wears CPAP set at 2.5   Wears glasses     Past Surgical History:  Procedure Laterality Date   BACK SURGERY  1986   ruptured disc   BREAST BIOPSY Left 2008   NEG   BREAST BIOPSY Left 08-20-14   POS   BREAST BIOPSY Right 2007   NEG   BREAST EXCISIONAL BIOPSY Left 1984   NEG   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 HER2 negative.  Wide excision with  SLN biopsy.  MammoSite radiation   CARPAL TUNNEL RELEASE     CHOLECYSTECTOMY  2006   COLONOSCOPY  2010   Dr. Jamal Collin   COLONOSCOPY WITH PROPOFOL N/A 05/17/2017   Procedure: COLONOSCOPY WITH PROPOFOL;  Surgeon: Manya Silvas, MD;  Location: [facility] ENDOSCOPY;  Service: Endoscopy;  Laterality: N/A;   DILATION AND CURETTAGE OF UTERUS     ESOPHAGOGASTRODUODENOSCOPY (EGD) WITH PROPOFOL N/A 05/17/2017   Procedure: ESOPHAGOGASTRODUODENOSCOPY (EGD) WITH PROPOFOL;  Surgeon: Manya Silvas, MD;  Location[facility] ENDOSCOPY;  Service: Endoscopy;  Laterality: N/A;   LAPAROTOMY FOR REMOVAL TUMOR LUMBAR PLEXES     Leg stent  2011   MOLE REMOVAL  2013   15 removed   POSTERIOR CERVICAL LAMINECTOMY Left 10/15/2015   Procedure: Left Cervical four- five  Hemilaminectomy/Remove mass;  Surgeon: Leeroy Cha, MD;  Location: [facility]URO ORS;  Service: Neurosurgery;  Laterality: Left;  Left C4-5 Hemilaminectomy/Remove mass    Family History  Problem Relation Age of Onset   Cancer Father        Prostate   Diabetes Father    Cerebrovascular Accident Father    Hypertension Mother    AAA (abdominal aortic aneurysm) Mother    Hyperlipidemia Other        Parent   Miscarriages / Stillbirths Other        Parent   Hypertension Other        parent   Heart disease Other        Parent   Breast cancer Maternal Aunt 10    SOCIAL HX: reviewed.    Current Outpatient Medications:    aspirin 325 MG tablet, Take 325 mg by mouth daily., Disp: , Rfl:    carvedilol (COREG) 12.5 MG tablet, TAKE 1 TABLET(12.5 MG) BY MOUTH TWICE DAILY WITH MEALS, Disp: 180 tablet, Rfl: 1   Cholecalciferol 25 MCG (1000 UT) tablet, Take 1,000 Units by mouth daily. , Disp: , Rfl:    empagliflozin (JARDIANCE) 25 MG TABS tablet, Take 1 tablet (25 mg total) by mouth daily before breakfast., Disp: 90 tablet, Rfl: 3   glucose blood (ONETOUCH VERIO) test strip, Use to check blood sugars up to three times daily., Disp: 300 each, Rfl: 3   hydrALAZINE (APRESOLINE) 10 MG tablet, TAKE 1 TABLET(10 MG) BY MOUTH THREE TIMES DAILY, Disp: 270 tablet, Rfl: 1   losartan-hydrochlorothiazide (HYZAAR) 100-25 MG tablet, TAKE 1 TABLET BY MOUTH DAILY, Disp: 90 tablet, Rfl: 1   omeprazole (PRILOSEC) 40 MG capsule, TAKE 1 CAPSULE(40 MG) BY MOUTH DAILY, Disp: 90 capsule, Rfl: 0   OneTouch Delica Lancets 54T MISC, Use to check blood sugars up to 3 times daily., Disp: 300 each, Rfl: 3   rosuvastatin (CRESTOR) 40 MG tablet, Take 1 tablet (40 mg total) by mouth daily., Disp: 90 tablet, Rfl: 3   vitamin E 400 UNIT capsule, Take 400 Units by mouth daily., Disp: , Rfl:   EXAM:  VITALS per patient if applicable: 625W/38  GENERAL: alert, oriented, appears well and in no acute distress  HEENT: atraumatic,  conjunttiva clear, no obvious abnormalities on inspection of external nose and ears  NECK: normal movements of the head and neck  LUNGS: on inspection no signs of respiratory distress, breathing rate appears normal, no obvious gross SOB, gasping or wheezing  CV: no obvious cyanosis  PSYCH/NEURO: pleasant and cooperative, no obvious depression or anxiety, speech and thought processing grossly intact  ASSESSMENT AND PLAN:  Discussed the following assessment and plan:  Problem List Items  Addressed This Visit     Abnormal liver function test    Fatty liver found on previous CT.  Diet and exercise.  Follow liver function tests.         CAD (coronary artery disease)    Followed by cardiology.  No pain.  Continue risk factor modification.  Continue Crestor.       Carotid artery disease ([place])    Followed by [facility].  Last evaluated in December 2019.  Recommended follow-up in 2 years.  Need to confirm follow-up.       CKD (chronic kidney disease), stage III (HCC)    GFR 44 on recent check.  Avoid anti-inflammatories..  Stay hydrated.  Recheck metabolic panel in 3 to 4 weeks.       Relevant Orders   Basic metabolic panel   COPD (chronic obstructive pulmonary disease) (HCC)    Breathing stable.       Diabetes mellitus with cardiac complication (HCC)    On jardiance.  Doing well on this medication.  Blood sugars as outlined.  Appear to be doing better.  Follow met b and a1c.  Low carb diet and exercise.         GERD (gastroesophageal reflux disease)    No upper symptoms reported EGD January 2019 with gastritis.  Continues on Prilosec.       History of breast cancer    Saw Dr Rogue Bussing.  Completed femara.  Off now.  Due mammogram.  Schedule.  Needs bone density.         Relevant Orders   MM 3D SCREEN BREAST BILATERAL   History of colon polyps    Last colonoscopy January 2019 (Dr. Vira Agar).  Per patient recommended follow-up in 3 years.  Overdue follow-up.        Hypercholesterolemia    On Crestor.  Low-cholesterol diet and exercise.  Follow-up lipid panel liver function test.       Hypertension    Blood pressures as outlined.  Doing well.  Continue losartan/hctz, coreg and hydralazine.  Follow pressures.  Follow metabolic panel.        Peripheral vascular disease ([place])    Followed by a [facility].  Continue aspirin and Crestor. evaluated in 04/2018.  Per note, recommended f/u in 2 years. Overdue.  Need to confirm f/u.        Stress    Increased stress as outlined.  Discussed. Has good support.  Does not feel needs any further intervention at this time.  Follow.        Thyroid nodule    Evaluated previously by Dr. Gabriel Carina.  Biopsy negative.  Follow TSH.       Other Visit Diagnoses     Encounter for screening mammogram for malignant neoplasm of breast    -  Primary   Relevant Orders   MM 3D SCREEN BREAST BILATERAL       Return in about 3 months (around 01/28/2021) for physical.   I discussed the assessment and treatment plan with the patient. The patient was provided an opportunity to ask questions and all were answered. The patient agreed with the plan and demonstrated an understanding of the instructions.   The patient was advised to call back or seek an in-person evaluation if the symptoms worsen or if the condition fails to improve as anticipated.   Einar Pheasant, MD

## 2020-11-03 ENCOUNTER — Telehealth: Payer: Self-pay | Admitting: Internal Medicine

## 2020-11-03 ENCOUNTER — Encounter: Payer: Self-pay | Admitting: Internal Medicine

## 2020-11-03 NOTE — Telephone Encounter (Signed)
She is overdue mammogram.  Needs to schedule.  History of breast cancer.  Due screening.  Also needs bone density.  (Both should be ordered)

## 2020-11-03 NOTE — Assessment & Plan Note (Signed)
Breathing stable.

## 2020-11-03 NOTE — Assessment & Plan Note (Signed)
On jardiance.  Doing well on this medication.  Blood sugars as outlined.  Appear to be doing better.  Follow met b and a1c.  Low carb diet and exercise.

## 2020-11-03 NOTE — Assessment & Plan Note (Addendum)
Increased stress as outlined.  Discussed. Has good support.  Does not feel needs any further intervention at this time.  Follow.

## 2020-11-03 NOTE — Assessment & Plan Note (Signed)
GFR 44 on recent check.  Avoid anti-inflammatories..  Stay hydrated.  Recheck metabolic panel in 3 to 4 weeks.

## 2020-11-03 NOTE — Assessment & Plan Note (Addendum)
Saw Dr Rogue Bussing.  Completed femara.  Off now.  Due mammogram.  Schedule.  Needs bone density.

## 2020-11-03 NOTE — Assessment & Plan Note (Addendum)
Followed by a [facility].  Continue aspirin and Crestor. evaluated in 04/2018.  Per note, recommended f/u in 2 years. Overdue.  Need to confirm f/u.

## 2020-11-03 NOTE — Assessment & Plan Note (Signed)
No upper symptoms reported EGD January 2019 with gastritis.  Continues on Prilosec.

## 2020-11-03 NOTE — Assessment & Plan Note (Signed)
On Crestor.  Low-cholesterol diet and exercise.  Follow-up lipid panel liver function test.

## 2020-11-03 NOTE — Assessment & Plan Note (Signed)
Followed by [facility].  Last evaluated in December 2019.  Recommended follow-up in 2 years.  Need to confirm follow-up.

## 2020-11-03 NOTE — Assessment & Plan Note (Signed)
Evaluated previously by Dr. Gabriel Carina.  Biopsy negative.  Follow TSH.

## 2020-11-03 NOTE — Assessment & Plan Note (Signed)
Fatty liver found on previous CT.  Diet and exercise.  Follow liver function tests.  

## 2020-11-03 NOTE — Assessment & Plan Note (Signed)
Last colonoscopy January 2019 (Dr. Vira Agar).  Per patient recommended follow-up in 3 years.  Overdue follow-up.

## 2020-11-03 NOTE — Assessment & Plan Note (Signed)
Followed by cardiology.  No pain.  Continue risk factor modification.  Continue Crestor.

## 2020-11-03 NOTE — Assessment & Plan Note (Signed)
Blood pressures as outlined.  Doing well.  Continue losartan/hctz, coreg and hydralazine.  Follow pressures.  Follow metabolic panel.

## 2020-11-04 NOTE — Telephone Encounter (Signed)
Patient aware of appt date and time. 

## 2020-11-04 NOTE — Telephone Encounter (Signed)
Mammogram and bone density has been scheduled. Will notify patient.

## 2020-11-07 DIAGNOSIS — H6123 Impacted cerumen, bilateral: Secondary | ICD-10-CM | POA: Diagnosis not present

## 2020-11-07 DIAGNOSIS — D2322 Other benign neoplasm of skin of left ear and external auricular canal: Secondary | ICD-10-CM | POA: Diagnosis not present

## 2020-11-13 ENCOUNTER — Other Ambulatory Visit: Payer: Self-pay | Admitting: Internal Medicine

## 2020-11-14 ENCOUNTER — Ambulatory Visit (INDEPENDENT_AMBULATORY_CARE_PROVIDER_SITE_OTHER): Payer: Medicare Other | Admitting: Pharmacist

## 2020-11-14 DIAGNOSIS — E1159 Type 2 diabetes mellitus with other circulatory complications: Secondary | ICD-10-CM

## 2020-11-14 DIAGNOSIS — N1831 Chronic kidney disease, stage 3a: Secondary | ICD-10-CM | POA: Diagnosis not present

## 2020-11-14 DIAGNOSIS — I10 Essential (primary) hypertension: Secondary | ICD-10-CM

## 2020-11-14 DIAGNOSIS — I739 Peripheral vascular disease, unspecified: Secondary | ICD-10-CM

## 2020-11-14 NOTE — Chronic Care Management (AMB) (Signed)
Chronic Care Management Pharmacy Note  11/14/2020 Name:  BRITTAN BUTTERBAUGH MRN:  [id number] DOB:  [birth year]  Subjective: Lindsey Bentley is an 75 y.o. year old female who is a primary patient of Einar Pheasant, MD.  The CCM team was consulted for assistance with disease management and care coordination needs.    Engaged with patient by telephone for follow up visit in response to provider referral for pharmacy case management and/or care coordination services.   Consent to Services:  The patient was given information about Chronic Care Management services, agreed to services, and gave verbal consent prior to initiation of services.  Please see initial visit note for detailed documentation.   Patient Care Team: Einar Pheasant, MD as PCP - General (Internal Medicine) Christene Lye, MD (General Surgery) Einar Pheasant, MD (Internal Medicine) [name], RPH-CPP as Pharmacist (Pharmacist)  Recent office visits: 6/20 - PCP f/u. Needs AVVS f/u, mammogram, DEXA (scheduled); A1c 7.3%, LDL 77; eGFR 44 - recheck in 4 weeks - scheduled    Objective:  Lab Results  Component Value Date   CREATININE 1.19 10/16/2020   CREATININE 0.95 02/09/2020   CREATININE 1.12 (H) 12/14/2019    Lab Results  Component Value Date   HGBA1C 7.3 (H) 10/16/2020   Last diabetic Eye exam: No results found for: HMDIABEYEEXA  Last diabetic Foot exam:  Lab Results  Component Value Date/Time   HMDIABFOOTEX on my exam 06/20/2018 12:00 AM        Component Value Date/Time   CHOL 144 10/16/2020 0859   TRIG 223.0 (H) 10/16/2020 0859   HDL 41.30 10/16/2020 0859   CHOLHDL 3 10/16/2020 0859   VLDL 44.6 (H) 10/16/2020 0859   LDLCALC 64 04/15/2017 0841   LDLDIRECT 77.0 10/16/2020 0859    Hepatic Function Latest Ref Rng & Units 10/16/2020 02/09/2020 09/06/2019  Total Protein 6.0 - 8.3 g/dL 7.5 7.3 7.2  Albumin 3.5 - 5.2 g/dL 4.4 4.5 4.3  AST 0 - 37 U/L _0 ALT 0 - 35 U/L _1 Alk  Phosphatase 39 - 117 U/L 53 61 61  Total Bilirubin 0.2 - 1.2 mg/dL 0.7 0.9 0.6  Bilirubin, Direct 0.0 - 0.3 mg/dL 0.1 0.1 0.1    Lab Results  Component Value Date/Time   TSH 4.83 (H) 10/16/2020 08:59 AM   TSH 4.34 05/17/2019 08:06 AM   FREET4 0.67 06/07/2012 08:26 AM    CBC Latest Ref Rng & Units 02/09/2020 12/14/2019 01/27/2019  WBC 4.0 - 10.5 K/uL 6.6 4.8 5.5  Hemoglobin 12.0 - 15.0 g/dL 12.7 12.5 12.2  Hematocrit 36.0 - 46.0 % 37.1 36.2 35.5(L)  Platelets 150.0 - 400.0 K/uL 191.0 150 180.0    Lab Results  Component Value Date/Time   VD25OH 20.87 (L) 06/27/2014 08:25 AM    Clinical ASCVD: Yes  The 10-year ASCVD risk score Mikey Bussing DC Jr., et al., 2013) is: 36.7%   Values used to calculate the score:     Age: 32 years     Sex: Female     Is Non-Hispanic African American: No     Diabetic: Yes     Tobacco smoker: No     Systolic Blood Pressure: 516 mmHg     Is BP treated: Yes     HDL Cholesterol: 41.3 mg/dL     Total Cholesterol: 144 mg/dL     Social History   Tobacco Use  Smoking Status Never  Smokeless Tobacco Never   BP Readings  from Last 3 Encounters:  06/11/20 123/70  04/08/20 136/70  03/11/20 133/69   Pulse Readings from Last 3 Encounters:  04/08/20 60  03/11/20 67  02/26/20 (!) 57   Wt Readings from Last 3 Encounters:  10/28/20 195 lb (88.5 kg)  06/11/20 195 lb (88.5 kg)  04/08/20 203 lb 6.4 oz (92.3 kg)    Assessment: Review of patient past medical history, allergies, medications, health status, including review of consultants reports, laboratory and other test data, was performed as part of comprehensive evaluation and provision of chronic care management services.   SDOH:  (Social Determinants of Health) assessments and interventions performed: patient assistance   CCM Care Plan  Allergies  Allergen Reactions   Codeine Nausea And Vomiting   Prednisone Swelling   Tape Itching and Rash    Please use "paper" tape Please use "paper" tape     Medications Reviewed Today     Reviewed by [name], RPH-CPP (Pharmacist) on 11/14/20 at [phone]  Med List Status: <None>   Medication Order Taking? Sig Documenting Provider Last Dose Status Informant  aspirin 325 MG tablet [id number] Yes Take 325 mg by mouth daily. [provider] Taking Active Self  carvedilol (COREG) 12.5 MG tablet [id number] Yes TAKE 1 TABLET(12.5 MG) BY MOUTH TWICE DAILY WITH MEALS Einar Pheasant, MD Taking Active   Cholecalciferol 25 MCG (1000 UT) tablet [id number] Yes Take 1,000 Units by mouth daily.  [provider] Taking Active Self           Med Note Meda Coffee, Rex Kras   Wed Oct 09, 2015  4:29 PM)    empagliflozin (JARDIANCE) 25 MG TABS tablet [id number] Yes Take 1 tablet (25 mg total) by mouth daily before breakfast. Einar Pheasant, MD Taking Active   glucose blood (ONETOUCH VERIO) test strip [id number]  Use to check blood sugars up to three times daily. Einar Pheasant, MD  Active   hydrALAZINE (APRESOLINE) 10 MG tablet [id number] Yes TAKE 1 TABLET(10 MG) BY MOUTH THREE TIMES DAILY Einar Pheasant, MD Taking Active   losartan-hydrochlorothiazide [facility]) 100-25 MG tablet [id number] Yes TAKE 1 TABLET BY MOUTH DAILY Einar Pheasant, MD Taking Active   omeprazole (PRILOSEC) 40 MG capsule [id number] Yes TAKE 1 CAPSULE(40 MG) BY MOUTH DAILY Einar Pheasant, MD Taking Active   OneTouch Delica Lancets 62H MISC [id number]  Use to check blood sugars up to 3 times daily. Einar Pheasant, MD  Active   rosuvastatin (CRESTOR) 40 MG tablet [id number] Yes Take 1 tablet (40 mg total) by mouth daily. Einar Pheasant, MD Taking Active   vitamin E 400 UNIT capsule [id number] Yes Take 400 Units by mouth daily. [provider] Taking Active Self            Patient Active Problem List   Diagnosis Date Noted   CKD (chronic kidney disease), stage III ([place]) 10/28/2020   Vaginal lesion 04/15/2020   Carcinoma of lower-outer quadrant of left breast in female,  estrogen receptor positive ([place]) 02/26/2020   History of COVID-19 01/07/2020   Cracked skin on feet 10/01/2019   History of colon polyps 10/01/2019   Right groin pain 07/09/2019   Seroma of breast 03/30/2018   Abdominal pain 02/27/2018   Bradycardia 10/18/2017   Frequent PVCs 10/18/2017   OSA (obstructive sleep apnea) 10/18/2017   COPD (chronic obstructive pulmonary disease) ([place]) 08/26/2017   PAC (premature atrial contraction) 03/22/2017   Palpitations 03/22/2017   Back pain 10/14/2016   Diabetes mellitus with cardiac complication ([place]) 94/49/6759  Bilateral leg edema 06/04/2016   Carpal tunnel syndrome 04/12/2016   Lymphedema 12/28/2015   Carotid artery disease ([place]) 12/28/2015   Fullness of breast 12/19/2015   Musculoskeletal pain 10/18/2015   S/P spinal surgery 10/18/2015   Cervical spine tumor 10/15/2015   Neck pain 10/10/2015   Hypertensive left ventricular hypertrophy 06/26/2015   Trigger middle finger of right hand 05/27/2015   Chest pain 05/08/2015   SOB (shortness of breath) 05/08/2015   Trigger finger, right 05/08/2015   Combined fat and carbohydrate induced hyperlipemia 05/08/2015   Pressure in head 02/08/2015   Hypercholesterolemia 01/02/2015   History of breast cancer 11/06/2014   Health care maintenance 11/06/2014   Ankle swelling 11/06/2014   Stress 11/06/2014   Neck fullness 06/29/2014   Osteopenia 03/31/2014   Snoring 11/05/2013   Skin lesions 11/05/2013   Light headedness 10/30/2013   Abnormal liver function test 08/05/2012   Thyroid nodule 05/31/2012   CAD (coronary artery disease) 05/31/2012   Hypertension 02/24/2012   Peripheral vascular disease ([place]) 02/24/2012   GERD (gastroesophageal reflux disease) 02/24/2012    Immunization History  Administered Date(s) Administered   Pneumococcal Conjugate-13 12/17/2016   Pneumococcal Polysaccharide-23 07/26/2012   Td 05/11/2004    Conditions to be addressed/monitored: CAD, HTN, HLD, and  DMII  Care Plan : Medication Management  Updates made by [name], RPH-CPP since 11/14/2020 12:00 AM     Problem: Diabetes, Hypertension, Hyperlipidemia      Long-Range Goal: Disease Progression Prevention   This Visit's Progress: On track  Recent Progress: On track  Priority: High  Note:   Current Barriers:  Unable to independently afford treatment regimen Unable to maintain control of diabetes, hyperlipidemia  Pharmacist Clinical Goal(s):  Over the next 90 days, patient will verbalize ability to afford treatment regimen. Over the next 90 days, patient will achieve adherence to monitoring guidelines and medication adherence to achieve therapeutic efficacy. Over the next 90 days, patient will achieve control of diabetes and hyperlipidemia as evidenced by improvement in lab work through collaboration with PharmD and provider.   Interventions: 1:1 collaboration with Einar Pheasant, MD regarding development and update of comprehensive plan of care as evidenced by provider attestation and co-signature Inter-disciplinary care team collaboration (see longitudinal plan of care) Comprehensive medication review performed; medication list updated in electronic medical record  Health Maintenance: Discussed COVID vaccination. Patient declines. Encouraged to continue to consider and let us know if she changes her mind.  DEXA and mammogram scheduled  Diabetes  Uncontrolled; current treatment: Jardiance 25 mg daily  Hx intolerance to metformin (including XR), loose stools Current glucose readings: fastings: 119-130s; forgets to check glucose readings after meals Most recent weight: 195 lbs - maintaining recently Current meal patterns: breakfast: eggs, bacon, coffee (2% milk), 2 pieces of toast, sometimes with jelly; lunch; doesn't eat much, sometimes a banana, tomato sandwich, sometimes soup; snacks: occasional cheese, crackers, popcorn, peanuts; supper: meat + 2 vegetables, no bread,  water; focuses on smaller portion sizes; desserts: infrequently Current physical activity: busy with caring for husband (recent health issues) Discussed that A1c is not at goal. Discussed possible addition of GLP1. Counseled on benefits, including glycemic benefit, weight loss, renal and CV benefit. Patient declines to change anything at this time Recommended to continue current regimen at this time.   Hypertension: Controlled per last clinic reading and home readings; current treatment: hydralazine 10 mg TID, losartan/HCTZ 100/25 mg daily, carvedilol 12.5 mg BID Home BP readings: maintaining ~ 120s/60s Denies any concerns with  lower extremity edema Recommended to continue current regimen along with periodic BP monitoring  Hyperlipidemia, vascular disease Controlled at goal <100; current treatment: rosuvastatin 40 mg daily Antiplatelet regimen: aspirin 325 mg daily (recommended by vascular) Recommended to continue current regimen along with AVVS follow up as previously recommended by PCP  GERD: Controlled per patient report; current regimen: omeprazole 40 mg daily Recommended to continue current regimen at this time  Patient Goals/Self-Care Activities Over the next 90 days, patient will:  - take medications as prescribed check blood glucose daily to twice daily, document, and provide at future appointments check blood pressure periodically, document, and provide at future appointments collaborate with provider on medication access solutions  Follow Up Plan: Telephone follow up appointment with care management team member scheduled for:~ 4 months      Medication Assistance:  Jardiance obtained through [place] medication assistance program.  Enrollment ends 05/10/21  Patient's preferred pharmacy is:  [facility] #[id number] Lorina Rabon, [place] - [address] [place] [address] Phone: [phone] Fax:  [phone]   Follow Up:  Patient agrees to Care Plan and Follow-up.  Plan: Telephone follow up appointment with care management team member scheduled for:  ~ 4 months  Catie Darnelle Maffucci, PharmD, [place], [place] at [place] Clinical Pharmacist [employer] at [employer] [phone]

## 2020-11-14 NOTE — Patient Instructions (Addendum)
Ms. Din,   It was great talking to you today!  We can continue your current regimen at this time. Our ideal A1c goal for you is for it to be less than 7%. This is the best way to reduce the long term complications related to uncontrolled diabetes. Please continue to check your blood sugar at home - fasting, before eating, and about 2 hours after your largest meal. It would be very helpful for you to write these readings down for Dr. Nicki Reaper to review moving forward.   Call me with any questions or concerns!  Catie Darnelle Maffucci, PharmD [phone]   Visit Information  PATIENT GOALS:  Goals Addressed               This Visit's Progress     Patient Stated     Medication Monitoring (pt-stated)        Patient Goals/Self-Care Activities Over the next 90 days, patient will:  - take medications as prescribed check blood glucose BID, document, and provide at future appointments check blood pressure periodically, document, and provide at future appointments. collaborate with provider on medication access solutions.          The patient verbalized understanding of instructions, educational materials, and care plan provided today and agreed to receive a mailed copy of patient instructions, educational materials, and care plan.    Plan: Telephone follow up appointment with care management team member scheduled for:  ~ 4 months  Catie Darnelle Maffucci, PharmD, [place], [place] Clinical Pharmacist [employer] at [employer] [phone]

## 2020-11-20 ENCOUNTER — Ambulatory Visit (INDEPENDENT_AMBULATORY_CARE_PROVIDER_SITE_OTHER): Payer: Medicare Other

## 2020-11-20 VITALS — Ht 65.0 in | Wt 195.0 lb

## 2020-11-20 DIAGNOSIS — Z0000 Encounter for general adult medical examination without abnormal findings: Secondary | ICD-10-CM

## 2020-11-20 DIAGNOSIS — G4733 Obstructive sleep apnea (adult) (pediatric): Secondary | ICD-10-CM | POA: Diagnosis not present

## 2020-11-20 NOTE — Patient Instructions (Addendum)
  Lindsey Bentley , Thank you for taking time to come for your Medicare Wellness Visit. I appreciate your ongoing commitment to your health goals. Please review the following plan we discussed and let me know if I can assist you in the future.   These are the goals we discussed:  Goals       Patient Stated     DIET - REDUCE PORTION SIZE (pt-stated)      Healthy meals Add more waist/core exercises        Medication Monitoring (pt-stated)      Patient Goals/Self-Care Activities Over the next 90 days, patient will:  - take medications as prescribed check blood glucose BID, document, and provide at future appointments check blood pressure periodically, document, and provide at future appointments. collaborate with provider on medication access solutions.          This is a list of the screening recommended for you and due dates:  Health Maintenance  Topic Date Due   Mammogram  10/30/2020   Zoster (Shingles) Vaccine (1 of 2) 01/28/2021*   Complete foot exam   10/28/2021*   Colon Cancer Screening  10/28/2021*   Tetanus Vaccine  11/20/2021*   Flu Shot  12/09/2020   Hemoglobin A1C  04/17/2021   Eye exam for diabetics  09/07/2021   DEXA scan (bone density measurement)  Completed   Hepatitis C Screening: USPSTF Recommendation to screen - Ages 15-79 yo.  Completed   Pneumonia vaccines  Completed   HPV Vaccine  Aged Out   COVID-19 Vaccine  Discontinued  *Topic was postponed. The date shown is not the original due date.

## 2020-11-20 NOTE — Progress Notes (Signed)
Subjective:   Lindsey Bentley is a 75 y.o. female who presents for Medicare Annual (Subsequent) preventive examination.  Review of Systems    No ROS.  Medicare Wellness Virtual Visit.  Visual/audio telehealth visit, UTA vital signs.   See social history for additional risk factors.   Cardiac Risk Factors include: advanced age (>42mn, >>38women);diabetes mellitus;hypertension     Objective:    Today's Vitals   11/20/20 1317  Weight: 195 lb (88.5 kg)  Height: _0  (1.651 m)   Body mass index is 32.45 kg/m.  Advanced Directives 11/20/2020 12/14/2019 11/20/2019 11/16/2018 06/29/2018 06/30/2017 05/17/2017  Does Patient Have a Medical Advance Directive? _1  No No  Would patient like information on creating a medical advance directive? No - Patient declined No - Patient declined No - Patient declined No - Patient declined No - Patient declined No - Patient declined -    Current Medications (verified) Outpatient Encounter Medications as of 11/20/2020  Medication Sig   acetaminophen (TYLENOL) 650 MG CR tablet Take 650 mg by mouth as needed for pain.   aspirin 325 MG tablet Take 325 mg by mouth daily.   carvedilol (COREG) 12.5 MG tablet TAKE 1 TABLET(12.5 MG) BY MOUTH TWICE DAILY WITH MEALS   Cholecalciferol 25 MCG (1000 UT) tablet Take 1,000 Units by mouth daily.    empagliflozin (JARDIANCE) 25 MG TABS tablet Take 1 tablet (25 mg total) by mouth daily before breakfast.   glucose blood (ONETOUCH VERIO) test strip Use to check blood sugars up to three times daily.   hydrALAZINE (APRESOLINE) 10 MG tablet TAKE 1 TABLET(10 MG) BY MOUTH THREE TIMES DAILY   losartan-hydrochlorothiazide (HYZAAR) 100-25 MG tablet TAKE 1 TABLET BY MOUTH DAILY   omeprazole (PRILOSEC) 40 MG capsule TAKE 1 CAPSULE(40 MG) BY MOUTH DAILY   OneTouch Delica Lancets 301SMISC Use to check blood sugars up to 3 times daily.   rosuvastatin (CRESTOR) 40 MG tablet Take 1 tablet (40 mg total) by mouth daily.   vitamin E  400 UNIT capsule Take 400 Units by mouth daily.   No facility-administered encounter medications on file as of 11/20/2020.    Allergies (verified) Codeine, Prednisone, and Tape   History: Past Medical History:  Diagnosis Date   Arthritis    [place]ast cancer [place]    [place]   LT LUMPECTOMY - TI, NO, MO - IDC, ER/PR pos, Her 2 neg, Rad tx's.    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   CHF (congestive heart failure) (HCC)    COPD (chronic obstructive pulmonary disease) (HCC)    Coronary artery disease    Depression    Diffuse cystic mastopathy 2013   DVT (deep venous thrombosis) (HCC)    Dyspnea    Elevated TSH    Family history of adverse reaction to anesthesia    Pt stated that son had a seizure with a combination of anesthesia and pain medication."   Fatty liver    GERD (gastroesophageal reflux disease)    Glaucoma    High cholesterol    History of chicken pox    Hyperglycemia    Hyperlipidemia    Hypertension    LVH (left ventricular hypertrophy)    PAC (premature atrial contraction)    Palpitations    [place]ipheral vascular disease ([place]    Personal history of radiation therapy 2016  BREAST CA   Pneumonia March 2014   Skin cancer 2013   Sleep apnea    wears CPAP set at 2.5   Wears glasses    Past Surgical History:  Procedure Laterality Date   BACK SURGERY  1986   ruptured disc   BREAST BIOPSY Left 2008   NEG   BREAST BIOPSY Left 08-20-14   POS   BREAST BIOPSY Right 2007   NEG   BREAST EXCISIONAL BIOPSY Left 1984   NEG   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 HER2 negative.  Wide excision with SLN biopsy.  MammoSite radiation   CARPAL TUNNEL RELEASE     CHOLECYSTECTOMY  2006   COLONOSCOPY  2010   Dr. Jamal Collin   COLONOSCOPY WITH PROPOFOL N/A 05/17/2017   Procedure: COLONOSCOPY WITH PROPOFOL;   Surgeon: Manya Silvas, MD;  [facility]COPY;  Service: Endoscopy;  Laterality: N/A;   DILATION AND CURETTAGE OF UTERUS     ESOPHAGOGASTRODUODENOSCOPY (EGD) WITH PROPOFOL N/A 05/17/2017   Procedure: ESOPHAGOGASTRODUODENOSCOPY (EGD) WITH PROPOFOL;  Surgeon: Manya Silvas, MD;  Location: [facility] ENDOSCOPY;  Service: Endoscopy;  Laterality: N/A;   LAPAROTOMY FOR REMOVAL TUMOR LUMBAR PLEXES     Leg stent  2011   MOLE REMOVAL  2013   15 removed   POSTERIOR CERVICAL LAMINECTOMY Left 10/15/2015   Procedure: Left Cervical four- five Hemilaminectomy/Remove mass;  Surgeon: Leeroy Cha, MD;  Location: [facility] NEURO ORS;  Service: Neurosurgery;  Laterality: Left;  Left C4-5 Hemilaminectomy/Remove mass   Family History  Problem Relation Age of Onset   Cancer Father        Prostate   Diabetes Father    Cerebrovascular Accident Father    Hypertension Mother    AAA (abdominal aortic aneurysm) Mother    Hyperlipidemia Other        Parent   Miscarriages / Stillbirths Other        Parent   Hypertension Other        parent   Heart disease Other        Parent   Breast cancer Maternal Aunt 72   Social History   Socioeconomic History   Marital status: Married    Spouse name: Not on file   Number of children: 3   Years of education: 12   Highest education level: Not on file  Occupational History   Occupation: Caregiver   Tobacco Use   Smoking status: Never   Smokeless tobacco: Never  Substance and Sexual Activity   Alcohol use: No    Alcohol/week: 0.0 standard drinks   Drug use: No   Sexual activity: Never  Other Topics Concern   Not on file  Social History Narrative   Regular exercise-mo   Caffeine Use-no   Social Determinants of Health   Financial Resource Strain: High Risk   Difficulty of Paying Living Expenses: Hard  Food Insecurity: No Food Insecurity   Worried About Charity fundraiser in the Last Year: Never true   Ran Out of Food in the Last Year: Never true   Transportation Needs: No Transportation Needs   Lack of Transportation (Medical): No   Lack of Transportation (Non-Medical): No  Physical Activity: Sufficiently Active   Days of Exercise per Week: 7 days   Minutes of Exercise per Session: 30 min  Stress: No Stress Concern Present   Feeling of Stress : Only a little  Social Connections: Engineer, building services of Communication with Friends and Family: More than three times a week   Frequency of Social Gatherings with Friends and Family: More than three times a week   Attends Religious Services: More than 4 times per year   Active Member of [employer] or Organizations: Yes   Attends Music therapist: More than 4 times per year   Marital Status: Married    Tobacco Counseling Counseling given: Not Answered   Clinical Intake:  Pre-visit preparation completed: Yes        Diabetes: Yes (Followed by pcp)  How often do you need to have someone help you when you read instructions, pamphlets, or other written materials from your doctor or pharmacy?: 1 - Never  Diabetes Management: Is the patient seen seen by Chronic Care Management for management of their diabetes?  Yes    Interpreter Needed?: No      Activities of Daily Living In your present state of health, do you have any difficulty performing the following activities: 11/20/2020  Hearing? N  Vision? N  Difficulty concentrating or making decisions? N  Walking or climbing stairs? N  Dressing or bathing? N  Doing errands, shopping? N  Preparing Food and eating ? N  Using the Toilet? N  In the past six months, have you accidently leaked urine? N  Do you have problems with loss of bowel control? N  Managing your Medications? N  Managing your Finances? N  Housekeeping or managing your Housekeeping? N  Some recent data might be hidden    Patient Care Team: Einar Pheasant, MD as PCP - General (Internal Medicine) Christene Lye, MD (General  Surgery) Einar Pheasant, MD (Internal Medicine) [name], RPH-CPP as Pharmacist (Pharmacist)  Indicate any recent Medical Services you may have received from other than [facility] providers in the past year (date may be approximate).     Assessment:   This is a routine wellness examination for Keegan.  I connected with Apoorva today by telephone and verified that I am speaking with the correct person using two identifiers. Location patient: home Location provider: work Persons participating in the virtual visit: patient, Marine scientist.    I discussed the limitations, risks, security and privacy concerns of performing an evaluation and management service by telephone and the availability of in person appointments. The patient expressed understanding and verbally consented to this telephonic visit.    Interactive audio and video telecommunications were attempted between this provider and patient, however failed, due to patient having technical difficulties OR patient did not have access to video capability.  We continued and completed visit with audio only.  Some vital signs may be absent or patient reported.   Hearing/Vision screen No change.   Dietary issues and exercise activities discussed: Current Exercise Habits: Home exercise routine, Intensity: Mild Healthy diet; portion control Good water intake   Goals Addressed               This Visit's Progress     Patient Stated     DIET - REDUCE PORTION SIZE (pt-stated)        Healthy meals Add more waist/core exercises         Depression Screen PHQ 2/9 Scores 11/20/2020 10/28/2020 11/20/2019 11/16/2018 08/25/2017 05/21/2016 04/07/2016  PHQ - 2 Score 0 0 0 0 0 0 0  PHQ- 9 Score - - - - - - -    Fall Risk Fall Risk  11/20/2019 01/31/2019 11/16/2018 11/01/2018  03/29/2018  Falls in the past year? 0 0 0 0 0  Number falls in past yr: 0 - - - -  Follow up Falls evaluation completed Falls evaluation completed - - -    Cognitive  Function: MMSE - Mini Mental State Exam 02/12/2016  Orientation to time 5  Orientation to Place 5  Registration 3  Attention/ Calculation 5  Recall 2  Language- name 2 objects 2  Language- repeat 1  Language- follow 3 step command 3  Language- read & follow direction 1  Write a sentence 1  Copy design 1  Total score 29     6CIT Screen 11/20/2020 11/20/2019 11/16/2018  What Year? 0 points 0 points 0 points  What month? 0 points 0 points 0 points  What time? 0 points - 0 points  Count back from 20 0 points - 0 points  Months in reverse 0 points 0 points 0 points  Repeat phrase - 2 points 0 points  Total Score - - 0    Immunizations Immunization History  Administered Date(s) Administered   Pneumococcal Conjugate-13 12/17/2016   Pneumococcal Polysaccharide-23 07/26/2012   Td 05/11/2004    Health Maintenance Health Maintenance  Topic Date Due   MAMMOGRAM  10/30/2020   Zoster Vaccines- Shingrix (1 of 2) 01/28/2021 (Originally 08/14/1964)   FOOT EXAM  10/28/2021 (Originally 06/21/2019)   COLONOSCOPY (Pts 45-42yr Insurance coverage will need to be confirmed)  10/28/2021 (Originally 05/17/2020)   TETANUS/TDAP  11/20/2021 (Originally 05/11/2014)   INFLUENZA VACCINE  12/09/2020   HEMOGLOBIN A1C  04/17/2021   OPHTHALMOLOGY EXAM  09/07/2021   DEXA SCAN  Completed   Hepatitis C Screening  Completed   PNA vac Low Risk Adult  Completed   HPV VACCINES  Aged Out   COVID-19 Vaccine  Discontinued    Mammogram- scheduled  Lung Cancer Screening: (Low Dose CT Chest recommended if Age 76-80years, 30 pack-year currently smoking OR have quit w/in 15years.) does not qualify.   Vision Screening: Recommended annual ophthalmology exams for early detection of glaucoma and other disorders of the eye. Is the patient up to date with their annual eye exam?  Yes   Dental Screening: Recommended annual dental exams for proper oral hygiene  Community Resource Referral / Chronic Care Management: CRR  required this visit?  No   CCM required this visit?  No      Plan:     I have personally reviewed and noted the following in the patient's chart:   Medical and social history Use of alcohol, tobacco or illicit drugs  Current medications and supplements including opioid prescriptions.  Functional ability and status Nutritional status Physical activity Advanced directives List of other physicians Hospitalizations, surgeries, and ER visits in previous 12 months Vitals Screenings to include cognitive, depression, and falls Referrals and appointments  In addition, I have reviewed and discussed with patient certain preventive protocols, quality metrics, and best practice recommendations. A written personalized care plan for preventive services as well as general preventive health recommendations were provided to patient via mail.     OVarney Biles LPN   [phone]

## 2020-11-26 ENCOUNTER — Other Ambulatory Visit: Payer: Self-pay

## 2020-11-26 ENCOUNTER — Other Ambulatory Visit (INDEPENDENT_AMBULATORY_CARE_PROVIDER_SITE_OTHER): Payer: Medicare Other

## 2020-11-26 DIAGNOSIS — N1831 Chronic kidney disease, stage 3a: Secondary | ICD-10-CM | POA: Diagnosis not present

## 2020-11-26 LAB — BASIC METABOLIC PANEL
BUN: 27 mg/dL — ABNORMAL HIGH (ref 6–23)
CO2: 23 mEq/L (ref 19–32)
Calcium: 9.9 mg/dL (ref 8.4–10.5)
Chloride: 104 mEq/L (ref 96–112)
Creatinine, Ser: 1.14 mg/dL (ref 0.40–1.20)
GFR: 46.83 mL/min — ABNORMAL LOW (ref >60.00)
Glucose, Bld: 198 mg/dL — ABNORMAL HIGH (ref 70–99)
Potassium: 3.9 mEq/L (ref 3.5–5.1)
Sodium: 138 mEq/L (ref 135–145)

## 2020-12-03 ENCOUNTER — Other Ambulatory Visit: Payer: Medicare Other

## 2020-12-18 ENCOUNTER — Ambulatory Visit
Admission: RE | Admit: 2020-12-18 | Discharge: 2020-12-18 | Disposition: A | Discharge status: Home / Self Care | Payer: Medicare Other | Source: Ambulatory Visit | Attending: Internal Medicine | Admitting: Internal Medicine

## 2020-12-18 ENCOUNTER — Other Ambulatory Visit: Payer: Self-pay

## 2020-12-18 DIAGNOSIS — C50912 Malignant neoplasm of unspecified site of left female breast: Secondary | ICD-10-CM

## 2020-12-18 DIAGNOSIS — Z1231 Encounter for screening mammogram for malignant neoplasm of breast: Secondary | ICD-10-CM | POA: Insufficient documentation

## 2020-12-18 DIAGNOSIS — Z853 Personal history of malignant neoplasm of breast: Secondary | ICD-10-CM | POA: Diagnosis not present

## 2020-12-18 DIAGNOSIS — E2839 Other primary ovarian failure: Secondary | ICD-10-CM | POA: Insufficient documentation

## 2020-12-18 DIAGNOSIS — M8589 Other specified disorders of bone density and structure, multiple sites: Secondary | ICD-10-CM | POA: Diagnosis not present

## 2021-01-02 ENCOUNTER — Other Ambulatory Visit: Payer: Self-pay | Admitting: Otolaryngology

## 2021-01-02 DIAGNOSIS — L820 Inflamed seborrheic keratosis: Secondary | ICD-10-CM | POA: Diagnosis not present

## 2021-01-06 LAB — SURGICAL PATHOLOGY

## 2021-01-07 ENCOUNTER — Telehealth: Payer: Self-pay | Admitting: Internal Medicine

## 2021-01-07 NOTE — Telephone Encounter (Signed)
Patient calling in and states she received a call from our office.   Please advise

## 2021-01-08 NOTE — Telephone Encounter (Signed)
Did not call patient. Patient is aware.

## 2021-01-19 ENCOUNTER — Other Ambulatory Visit: Payer: Self-pay | Admitting: Internal Medicine

## 2021-02-03 ENCOUNTER — Telehealth: Payer: Self-pay | Admitting: *Deleted

## 2021-02-03 DIAGNOSIS — I10 Essential (primary) hypertension: Secondary | ICD-10-CM

## 2021-02-03 DIAGNOSIS — N1831 Chronic kidney disease, stage 3a: Secondary | ICD-10-CM

## 2021-02-03 DIAGNOSIS — E7800 Pure hypercholesterolemia, unspecified: Secondary | ICD-10-CM

## 2021-02-03 DIAGNOSIS — E1159 Type 2 diabetes mellitus with other circulatory complications: Secondary | ICD-10-CM

## 2021-02-03 NOTE — Telephone Encounter (Signed)
Please place future orders for lab appt.  

## 2021-02-04 ENCOUNTER — Other Ambulatory Visit (INDEPENDENT_AMBULATORY_CARE_PROVIDER_SITE_OTHER): Payer: Medicare Other

## 2021-02-04 ENCOUNTER — Other Ambulatory Visit: Payer: Self-pay

## 2021-02-04 DIAGNOSIS — E7800 Pure hypercholesterolemia, unspecified: Secondary | ICD-10-CM

## 2021-02-04 DIAGNOSIS — N1831 Chronic kidney disease, stage 3a: Secondary | ICD-10-CM

## 2021-02-04 DIAGNOSIS — E1159 Type 2 diabetes mellitus with other circulatory complications: Secondary | ICD-10-CM

## 2021-02-04 LAB — CBC WITH DIFFERENTIAL/PLATELET
Basophils Absolute: 0 10*3/uL (ref 0.0–0.1)
Basophils Relative: 0.6 % (ref 0.0–3.0)
Eosinophils Absolute: 0.1 10*3/uL (ref 0.0–0.7)
Eosinophils Relative: 2.4 % (ref 0.0–5.0)
HCT: 37.7 % (ref 36.0–46.0)
Hemoglobin: 13 g/dL (ref 12.0–15.0)
Lymphocytes Relative: 35.1 % (ref 12.0–46.0)
Lymphs Abs: 2 10*3/uL (ref 0.7–4.0)
MCHC: 34.3 g/dL (ref 30.0–36.0)
MCV: 91.1 fl (ref 78.0–100.0)
Monocytes Absolute: 0.5 10*3/uL (ref 0.1–1.0)
Monocytes Relative: 9.2 % (ref 3.0–12.0)
Neutro Abs: 3.1 10*3/uL (ref 1.4–7.7)
Neutrophils Relative %: 52.7 % (ref 43.0–77.0)
Platelets: 168 10*3/uL (ref 150.0–400.0)
RBC: 4.14 Mil/uL (ref 3.87–5.11)
RDW: 13.4 % (ref 11.5–15.5)
WBC: 5.8 10*3/uL (ref 4.0–10.5)

## 2021-02-04 LAB — HEPATIC FUNCTION PANEL
ALT: 15 U/L (ref 0–35)
AST: 14 U/L (ref 0–37)
Albumin: 4.2 g/dL (ref 3.5–5.2)
Alkaline Phosphatase: 50 U/L (ref 39–117)
Bilirubin, Direct: 0.1 mg/dL (ref 0.0–0.3)
Total Bilirubin: 0.6 mg/dL (ref 0.2–1.2)
Total Protein: 6.9 g/dL (ref 6.0–8.3)

## 2021-02-04 LAB — TSH: TSH: 6.12 u[IU]/mL — ABNORMAL HIGH (ref 0.35–5.50)

## 2021-02-04 LAB — BASIC METABOLIC PANEL
BUN: 24 mg/dL — ABNORMAL HIGH (ref 6–23)
CO2: 25 mEq/L (ref 19–32)
Calcium: 9.2 mg/dL (ref 8.4–10.5)
Chloride: 104 mEq/L (ref 96–112)
Creatinine, Ser: 1.24 mg/dL — ABNORMAL HIGH (ref 0.40–1.20)
GFR: 42.28 mL/min — ABNORMAL LOW (ref >60.00)
Glucose, Bld: 115 mg/dL — ABNORMAL HIGH (ref 70–99)
Potassium: 4.1 mEq/L (ref 3.5–5.1)
Sodium: 140 mEq/L (ref 135–145)

## 2021-02-04 LAB — LIPID PANEL
Cholesterol: 142 mg/dL (ref 0–200)
HDL: 36.8 mg/dL — ABNORMAL LOW (ref >39.00)
NonHDL: 105.35
Total CHOL/HDL Ratio: 4
Triglycerides: 224 mg/dL — ABNORMAL HIGH (ref 0.0–149.0)
VLDL: 44.8 mg/dL — ABNORMAL HIGH (ref 0.0–40.0)

## 2021-02-04 LAB — HEMOGLOBIN A1C: Hgb A1c MFr Bld: 7.1 % — ABNORMAL HIGH (ref 4.6–6.5)

## 2021-02-04 LAB — LDL CHOLESTEROL, DIRECT: Direct LDL: 74 mg/dL

## 2021-02-04 NOTE — Telephone Encounter (Signed)
Orders placed for f/u labs.  

## 2021-02-06 ENCOUNTER — Other Ambulatory Visit: Payer: Self-pay

## 2021-02-06 ENCOUNTER — Ambulatory Visit (INDEPENDENT_AMBULATORY_CARE_PROVIDER_SITE_OTHER): Payer: Medicare Other | Admitting: Internal Medicine

## 2021-02-06 VITALS — BP 130/74 | HR 74 | Temp 97.9°F | Resp 16 | Ht 65.0 in | Wt 194.8 lb

## 2021-02-06 DIAGNOSIS — Z860100 Personal history of colon polyps, unspecified: Secondary | ICD-10-CM

## 2021-02-06 DIAGNOSIS — E1159 Type 2 diabetes mellitus with other circulatory complications: Secondary | ICD-10-CM

## 2021-02-06 DIAGNOSIS — N1831 Chronic kidney disease, stage 3a: Secondary | ICD-10-CM | POA: Diagnosis not present

## 2021-02-06 DIAGNOSIS — J449 Chronic obstructive pulmonary disease, unspecified: Secondary | ICD-10-CM | POA: Diagnosis not present

## 2021-02-06 DIAGNOSIS — E7800 Pure hypercholesterolemia, unspecified: Secondary | ICD-10-CM

## 2021-02-06 DIAGNOSIS — I739 Peripheral vascular disease, unspecified: Secondary | ICD-10-CM | POA: Diagnosis not present

## 2021-02-06 DIAGNOSIS — F439 Reaction to severe stress, unspecified: Secondary | ICD-10-CM

## 2021-02-06 DIAGNOSIS — K219 Gastro-esophageal reflux disease without esophagitis: Secondary | ICD-10-CM

## 2021-02-06 DIAGNOSIS — I779 Disorder of arteries and arterioles, unspecified: Secondary | ICD-10-CM | POA: Diagnosis not present

## 2021-02-06 DIAGNOSIS — I10 Essential (primary) hypertension: Secondary | ICD-10-CM | POA: Diagnosis not present

## 2021-02-06 DIAGNOSIS — R079 Chest pain, unspecified: Secondary | ICD-10-CM | POA: Diagnosis not present

## 2021-02-06 DIAGNOSIS — E041 Nontoxic single thyroid nodule: Secondary | ICD-10-CM | POA: Diagnosis not present

## 2021-02-06 DIAGNOSIS — R001 Bradycardia, unspecified: Secondary | ICD-10-CM

## 2021-02-06 DIAGNOSIS — Z853 Personal history of malignant neoplasm of breast: Secondary | ICD-10-CM

## 2021-02-06 NOTE — Progress Notes (Signed)
[name], female   DOB: 1945-10-20, 75 y.o.   MRN: [id number]   Subjective:    [name], female    DOB: Apr 26, 1946, 75 y.o.   MRN: [id number]  This visit occurred during the SARS-CoV-2 public health emergency.  Safety protocols were in place, including screening questions prior to the visit, additional usage of staff PPE, and extensive cleaning of exam room while observing appropriate contact time as indicated for disinfecting solutions.   Patient here for physical exam.  Reports increased stress and chest discomfort.  Changed to f/u appt.   Chief Complaint  Patient presents with   Chest Pain   Stress   .   HPI Reports increased stress.  Discussed.  Has good support.  Noticed last week - sharp pain - left side of chest/breast. No sob.  No increased heart rate.  Also noticed increased right arm - aching.  Mid to upper arm.  Some dizziness/light headedness. This week - better.  Blood pressure has been doing better.  Heart rate in the 50s.  No increased cough or congestion.  No acid reflux or abdominal pain.  Bowels moving.      Past Medical History:  Diagnosis Date   Arthritis    Breast cancer ([place])    Breast cancer, left ([place]) 2016   LT LUMPECTOMY - TI, NO, MO - IDC, ER/PR pos, Her 2 neg, Rad tx's.    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   CHF (congestive heart failure) (HCC)    COPD (chronic obstructive pulmonary disease) (HCC)    Coronary artery disease    Depression    Diffuse cystic mastopathy 2013   DVT (deep venous thrombosis) (HCC)    Dyspnea    Elevated TSH    Family history of adverse reaction to anesthesia    Pt stated that son had a seizure with a combination of anesthesia and pain medication."   Fatty liver    GERD (gastroesophageal reflux disease)    Glaucoma    High cholesterol    History of chicken pox    Hyperglycemia    Hyperlipidemia     Hypertension    LVH (left ventricular hypertrophy)    PAC (premature atrial contraction)    Palpitations    Peripheral vascular disease ([place])    Personal history of radiation therapy 2016   BREAST CA   Pneumonia March 2014   Skin cancer 2013   Sleep apnea    wears CPAP set at 2.5   Wears glasses    Past Surgical History:  Procedure Laterality Date   BACK SURGERY  1986   ruptured disc   BREAST BIOPSY Left 2008   NEG   BREAST BIOPSY Left 08-20-14   POS   BREAST BIOPSY Right 2007   NEG   BREAST EXCISIONAL BIOPSY Left 1984   NEG   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 HER2 negative.  Wide excision with SLN biopsy.  MammoSite radiation   CARPAL TUNNEL RELEASE     CHOLECYSTECTOMY  2006   COLONOSCOPY  2010   Dr. Jamal Collin   COLONOSCOPY WITH PROPOFOL N/A 05/17/2017   Procedure: COLONOSCOPY WITH PROPOFOL;  Surgeon: Manya Silvas, MD;  Location: [facility] ENDOSCOPY;  Service: Endoscopy;  Laterality: N/A;   DILATION AND CURETTAGE OF UTERUS  ESOPHAGOGASTRODUODENOSCOPY (EGD) WITH PROPOFOL N/A 05/17/2017   Procedure: ESOPHAGOGASTRODUODENOSCOPY (EGD) WITH PROPOFOL;  Surgeon: Manya Silvas, MD;  Location: [facility] ENDOSCOPY;  Service: Endoscopy;  Laterality: N/A;   LAPAROTOMY FOR REMOVAL TUMOR LUMBAR PLEXES     Leg stent  2011   MOLE REMOVAL  2013   15 removed   POSTERIOR CERVICAL LAMINECTOMY Left 10/15/2015   Procedure: Left Cervical four- five Hemilaminectomy/Remove mass;  Surgeon: Leeroy Cha, MD;  Location: [facility];  Service: Neurosurgery;  Laterality: Left;  Left C4-5 Hemilaminectomy/Remove mass   Family History  Problem Relation Age of Onset   Cancer Father        Prostate   Diabetes Father    Cerebrovascular Accident Father    Hypertension Mother    AAA (abdominal aortic aneurysm) Mother    Hyperlipidemia Other        Parent   Miscarriages / Stillbirths Other        Parent   Hypertension Other         parent   Heart disease Other        Parent   Breast cancer Maternal Aunt 72   Social History   Socioeconomic History   Marital status: Married    Spouse name: Not on file   Number of children: 3   Years of education: 12   Highest education level: Not on file  Occupational History   Occupation: Caregiver   Tobacco Use   Smoking status: Never   Smokeless tobacco: Never  Substance and Sexual Activity   Alcohol use: No    Alcohol/week: 0.0 standard drinks   Drug use: No   Sexual activity: Never  Other Topics Concern   Not on file  Social History Narrative   Regular exercise-mo   Caffeine Use-no   Social Determinants of Health   Financial Resource Strain: High Risk   Difficulty of Paying Living Expenses: Hard  Food Insecurity: No Food Insecurity   Worried About Charity fundraiser in the Last Year: Never true   Ran Out of Food in the Last Year: Never true  Transportation Needs: No Transportation Needs   Lack of Transportation (Medical): No   Lack of Transportation (Non-Medical): No  Physical Activity: Sufficiently Active   Days of Exercise per Week: 7 days   Minutes of Exercise per Session: 30 min  Stress: No Stress Concern Present   Feeling of Stress : Only a little  Social Connections: Engineer, building services of Communication with Friends and Family: More than three times a week   Frequency of Social Gatherings with Friends and Family: More than three times a week   Attends Religious Services: More than 4 times per year   Active Member of [employer] or Organizations: Yes   Attends Music therapist: More than 4 times per year   Marital Status: Married     Review of Systems  Constitutional:  Negative for appetite change and unexpected weight change.  HENT:  Negative for congestion and sinus pressure.   Respiratory:  Negative for cough, chest tightness and shortness of breath.   Cardiovascular:  Negative for palpitations and leg swelling.        Sharp chest pain as outlined.  Noticed last week.    Gastrointestinal:  Negative for abdominal pain, diarrhea, nausea and vomiting.  Genitourinary:  Negative for difficulty urinating and dysuria.  Musculoskeletal:  Negative for joint swelling and myalgias.       Right arm pain as outlined.  Skin:  Negative for color change and rash.  Neurological:  Negative for headaches.       Previous dizziness/light headedness as outlined.  Noticed last week.   Psychiatric/Behavioral:  Negative for agitation and dysphoric mood.       Objective:     BP 130/74   Pulse 74   Temp 97.9 F (36.6 C)   Resp 16   Ht $R'5\' 5"'nV$  (1.651 m)   Wt 194 lb 12.8 oz (88.4 kg)   SpO2 98%   BMI 32.42 kg/m  Wt Readings from Last 3 Encounters:  02/06/21 194 lb 12.8 oz (88.4 kg)  11/20/20 195 lb (88.5 kg)  10/28/20 195 lb (88.5 kg)    Physical Exam Vitals reviewed.  Constitutional:      General: She is not in acute distress.    Appearance: Normal appearance.  HENT:     Head: Normocephalic and atraumatic.     Right Ear: External ear normal.     Left Ear: External ear normal.  Eyes:     General: No scleral icterus.       Right eye: No discharge.        Left eye: No discharge.     Conjunctiva/sclera: Conjunctivae normal.  Neck:     Thyroid: No thyromegaly.  Cardiovascular:     Rate and Rhythm: Normal rate and regular rhythm.  Pulmonary:     Effort: No respiratory distress.     Breath sounds: Normal breath sounds. No wheezing.  Abdominal:     General: Bowel sounds are normal.     Palpations: Abdomen is soft.     Tenderness: There is no abdominal tenderness.  Musculoskeletal:        General: No swelling or tenderness.     Cervical back: Neck supple. No tenderness.     Right lower leg: No edema.     Left lower leg: No edema.  Lymphadenopathy:     Cervical: No cervical adenopathy.  Skin:    Findings: No erythema or rash.  Neurological:     Mental Status: She is alert.  Psychiatric:        Mood and  Affect: Mood normal.        Behavior: Behavior normal.     Outpatient Encounter Medications as of 02/06/2021  Medication Sig   acetaminophen (TYLENOL) 650 MG CR tablet Take 650 mg by mouth as needed for pain.   aspirin 325 MG tablet Take 325 mg by mouth daily.   carvedilol (COREG) 12.5 MG tablet TAKE 1 TABLET(12.5 MG) BY MOUTH TWICE DAILY WITH MEALS   Cholecalciferol 25 MCG (1000 UT) tablet Take 1,000 Units by mouth daily.    empagliflozin (JARDIANCE) 25 MG TABS tablet Take 1 tablet (25 mg total) by mouth daily before breakfast.   glucose blood (ONETOUCH VERIO) test strip Use to check blood sugars up to three times daily.   hydrALAZINE (APRESOLINE) 10 MG tablet TAKE 1 TABLET(10 MG) BY MOUTH THREE TIMES DAILY   losartan-hydrochlorothiazide (HYZAAR) 100-25 MG tablet TAKE 1 TABLET BY MOUTH DAILY   omeprazole (PRILOSEC) 40 MG capsule TAKE 1 CAPSULE(40 MG) BY MOUTH DAILY   OneTouch Delica Lancets 50N MISC Use to check blood sugars up to 3 times daily.   rosuvastatin (CRESTOR) 40 MG tablet Take 1 tablet (40 mg total) by mouth daily.   vitamin E 400 UNIT capsule Take 400 Units by mouth daily.   No facility-administered encounter medications on file as of 02/06/2021.     Lab Results  Component Value Date   WBC 5.8 02/04/2021   HGB 13.0 02/04/2021   HCT 37.7 02/04/2021   PLT 168.0 02/04/2021   GLUCOSE 115 (H) 02/04/2021   CHOL 142 02/04/2021   TRIG 224.0 (H) 02/04/2021   HDL 36.80 (L) 02/04/2021   LDLDIRECT 74.0 02/04/2021   LDLCALC 64 04/15/2017   ALT 15 02/04/2021   AST 14 02/04/2021   NA 140 02/04/2021   K 4.1 02/04/2021   CL 104 02/04/2021   CREATININE 1.24 (H) 02/04/2021   BUN 24 (H) 02/04/2021   CO2 25 02/04/2021   TSH 6.12 (H) 02/04/2021   INR 0.9 07/13/2012   HGBA1C 7.1 (H) 02/04/2021   MICROALBUR 1.5 10/16/2020    DG Bone Density  Result Date: 12/18/2020 EXAM: DUAL X-RAY ABSORPTIOMETRY (DXA) FOR BONE MINERAL DENSITY IMPRESSION: Dear Dr. Nicki Reaper, Your patient Lindsey Bentley completed a FRAX assessment on 12/18/2020 using the [place] (analysis version: 14.10) manufactured by [employer]. The following summarizes the results of our evaluation. PATIENT BIOGRAPHICAL: Name: Lindsey Bentley, Lindsey Bentley Patient ID: [id number] Birth Date: 1945-07-10 Height:    64.0 in. Gender:     Female    [age]       Weight:    195.6 lbs. Ethnicity:  White                            Exam Date: 12/18/2020 FRAX* RESULTS:  (version: 3.5) 10-year Probability of Fracture1 Major Osteoporotic Fracture2 Hip Fracture 17.8% 3.7% Population: [place] (Caucasian) Risk Factors: History of Fracture (Adult) Based on Femur (Right) Neck BMD 1 -The 10-year probability of fracture may be lower than reported if the patient has received treatment. 2 -Major Osteoporotic Fracture: Clinical Spine, Forearm, Hip or Shoulder *FRAX is a Materials engineer of the [employer] for Metabolic Bone Disease, a Williams (WHO) [facility]. ASSESSMENT: The probability of a major osteoporotic fracture is 17.8% within the next ten years. The probability of a hip fracture is 3.7% within the next ten years. . Your patient Lindsey Bentley completed a BMD test on 12/18/2020 using the [place] (software version: 14.10) manufactured by [employer]. The following summarizes the results of our evaluation. Technologist: [facility] PATIENT BIOGRAPHICAL: Name: Lindsey Bentley, Lindsey Bentley Patient ID: [id number] Birth Date: 1945/12/29 Height: 64.0 in. Gender: Female Exam Date: 12/18/2020 Weight: 195.6 lbs. Indications: Advanced Age, Caucasian, Diabetic, Early Menopause, Height Loss, History of Breast Cancer, History of Fracture (Adult), History of Radiation, Postmenopausal Fractures: Left finger, Left wrist Treatments: aspirin, Jardiance, Omeprazole, Vitamin D DENSITOMETRY RESULTS: Site          Region     Measured Date Measured Age WHO Classification Young Adult T-score BMD          %Change vs. Previous Significant Change (*) DualFemur Neck Right 12/18/2020 76.3 Osteopenia -1.7 0.801 g/cm2 -0.7% - DualFemur Neck Right 10/05/2017 73.1 Osteopenia -1.7 0.807 g/cm2 -1.3% - DualFemur Neck Right 02/01/2014 69.4 Osteopenia -1.6 0.818 g/cm2 - - DualFemur Total Mean 12/18/2020 76.3 Osteopenia -1.1 0.874 g/cm2 0.2% - DualFemur Total Mean 10/05/2017 73.1 Osteopenia -1.1 0.872 g/cm2 0.5% - DualFemur Total Mean 02/01/2014 69.4 Osteopenia -1.1 0.868 g/cm2 - - Right Forearm Radius 33% 12/18/2020 76.3 Normal -0.8 0.806 g/cm2 -8.1% Yes Right Forearm Radius 33% 02/01/2014 69.4 Normal 0.0 0.877 g/cm2 - - ASSESSMENT: The BMD measured at Femur Neck Right is 0.801 g/cm2  with a T-score of -1.7. This patient is considered osteopenic according to World Health Organization [facility]) criteria. The scan quality is good. Lumbar spine was not utilized due to advanced degenerative changes. Compared with prior study, there has been no significant change in the total hip. World Science writer [facility]) criteria for post-menopausal, Caucasian Women: Normal:                   T-score at or above -1 SD Osteopenia/low bone mass: T-score between -1 and -2.5 SD Osteoporosis:             T-score at or below -2.5 SD RECOMMENDATIONS: 1. All patients should optimize calcium and vitamin D intake. 2. Consider FDA-approved medical therapies in postmenopausal women and men aged 62 years and older, based on the following: a. A hip or vertebral(clinical or morphometric) fracture b. T-score < -2.5 at the femoral neck or spine after appropriate evaluation to exclude secondary causes c. Low bone mass (T-score between -1.0 and -2.5 at the femoral neck or spine) and a 10-year probability of a hip fracture > 3% or a 10-year probability of a major osteoporosis-related fracture > 20% based on the [place]-adapted WHO algorithm 3. Clinician judgment and/or patient preferences may indicate treatment for people with 10-year fracture probabilities above or below  these levels FOLLOW-UP: People with diagnosed cases of osteoporosis or at high risk for fracture should have regular bone mineral density tests. For patients eligible for Medicare, routine testing is allowed once every 2 years. The testing frequency can be increased to one year for patients who have rapidly progressing disease, those who are receiving or discontinuing medical therapy to restore bone mass, or have additional risk factors. I have reviewed this report, and agree with the above findings. Mark A. Tyron Russell, M.D. [facility] Electronically Signed   By: Ulyses Southward M.D.   On: 12/18/2020 14:51   MM 3D SCREEN BREAST BILATERAL  Result Date: 12/18/2020 CLINICAL DATA:  Screening. EXAM: DIGITAL SCREENING BILATERAL MAMMOGRAM WITH TOMOSYNTHESIS AND CAD TECHNIQUE: Bilateral screening digital craniocaudal and mediolateral oblique mammograms were obtained. Bilateral screening digital breast tomosynthesis was performed. The images were evaluated with computer-aided detection. COMPARISON:  Previous exam(s). ACR Breast Density Category c: The breast tissue is heterogeneously dense, which may obscure small masses. FINDINGS: There are no findings suspicious for malignancy. IMPRESSION: No mammographic evidence of malignancy. A result letter of this screening mammogram will be mailed directly to the patient. RECOMMENDATION: Screening mammogram in one year. (Code:SM-B-01Y) BI-RADS CATEGORY  1: Negative. Electronically Signed   By: Frederico Hamman M.D.   On: 12/18/2020 14:54      Assessment & Plan:   Problem List Items Addressed This Visit     Bradycardia    Reports heart rate in the 50s.  Some dizziness/light headedness noted last week.  F/u with cardiology as outlined.  Question of need for monitor.  Pulse rate with rest today - 60 (SR).       Carotid artery disease (HCC)    Need to arrange f/u.  Overdue as outlined.       Chest pain - Primary    Described sharp chest pain as outlined.   Dizziness as well.  EKG - SR with no acute ischemic changes.  Given her history and symptoms, do feel f/u with cardiology warranted  - for further testing/evaluation.  Discussed if return of symptoms or change in symptoms, needs to be evaluated.       Relevant Orders  EKG 12-Lead (Completed)   CKD (chronic kidney disease), stage III (HCC)    GFR 42 on recent check.  Avoid anti-inflammatories..  Stay hydrated.       COPD (chronic obstructive pulmonary disease) (HCC)    Breathing overall stable.       Diabetes mellitus with cardiac complication (HCC)    On jardiance.   a1c 7.1.  Down some from previous check.  Follow met b and a1c.  Low carb diet and exercise.        GERD (gastroesophageal reflux disease)    No upper symptom reported.  On prilosec.       History of breast cancer    Completed femara.  Mammogram 12/2020 - Birads I.       History of colon polyps    Last colonoscopy January 2019 (Dr. Vira Agar).  Per patient recommended follow-up in 3 years.  Overdue follow-up. Will need to get current issues sorted through prior to f/u colonoscopy.       Hypercholesterolemia    On Crestor.  Low-cholesterol diet and exercise.  Follow-up lipid panel liver function test.      Hypertension    Blood pressures as outlined.  Continue losartan/hctz, coreg and hydralazine.  Follow pressures.  Follow metabolic panel.       Peripheral vascular disease ([place])    Followed by a [facility].  Continue aspirin and Crestor. evaluated in 04/2018.  Per note, recommended f/u in 2 years. Arrange f/u.        Stress    Increased stress.  Discussed.  Will notify me if feels needs any further intervention.  Follow.       Thyroid nodule    Evaluated by Dr Gabriel Carina.  Biopsy negative.  Follow tsh.          Einar Pheasant, MD

## 2021-02-12 ENCOUNTER — Telehealth: Payer: Self-pay | Admitting: Internal Medicine

## 2021-02-12 NOTE — Telephone Encounter (Signed)
LM for patient. Needs to be evaluated today

## 2021-02-12 NOTE — Telephone Encounter (Signed)
Patient called to check on her referral to cardiology. She fell today, thinks she passed out. She states that she wanted Dr Nicki Reaper to be aware of her fall. She also stated she may go to urgent care.

## 2021-02-15 ENCOUNTER — Telehealth: Payer: Self-pay | Admitting: Internal Medicine

## 2021-02-15 ENCOUNTER — Encounter: Payer: Self-pay | Admitting: Internal Medicine

## 2021-02-15 NOTE — Assessment & Plan Note (Signed)
Followed by a [facility].  Continue aspirin and Crestor. evaluated in 04/2018.  Per note, recommended f/u in 2 years. Arrange f/u.

## 2021-02-15 NOTE — Assessment & Plan Note (Signed)
Evaluated by Dr Solum.  Biopsy negative.  Follow tsh.   

## 2021-02-15 NOTE — Assessment & Plan Note (Addendum)
On jardiance.   a1c 7.1.  Down some from previous check.  Follow met b and a1c.  Low carb diet and exercise.   

## 2021-02-15 NOTE — Assessment & Plan Note (Signed)
Breathing overall stable.

## 2021-02-15 NOTE — Assessment & Plan Note (Signed)
Reports heart rate in the 50s.  Some dizziness/light headedness noted last week.  F/u with cardiology as outlined.  Question of need for monitor.  Pulse rate with rest today - 60 (SR).

## 2021-02-15 NOTE — Assessment & Plan Note (Signed)
Blood pressures as outlined.  Continue losartan/hctz, coreg and hydralazine.  Follow pressures.  Follow metabolic panel.

## 2021-02-15 NOTE — Assessment & Plan Note (Signed)
Last colonoscopy January 2019 (Dr. Vira Agar).  Per patient recommended follow-up in 3 years.  Overdue follow-up. Will need to get current issues sorted through prior to f/u colonoscopy.

## 2021-02-15 NOTE — Assessment & Plan Note (Signed)
Completed femara.  Mammogram 12/2020 - Birads I.

## 2021-02-15 NOTE — Assessment & Plan Note (Signed)
Described sharp chest pain as outlined.  Dizziness as well.  EKG - SR with no acute ischemic changes.  Given her history and symptoms, do feel f/u with cardiology warranted  - for further testing/evaluation.  Discussed if return of symptoms or change in symptoms, needs to be evaluated.

## 2021-02-15 NOTE — Assessment & Plan Note (Signed)
Increased stress.  Discussed.  Will notify me if feels needs any further intervention.  Follow  

## 2021-02-15 NOTE — Telephone Encounter (Signed)
Called Lindsey Bentley for update. She fell earlier in the week.  Did not trip.  Unsure if LOC.  She remembers events that occurred.  No head injury.  Hit her abdomen.  If feeling better. No pain with deep breathing.  States blood pressure has been doing well.  Pulse rate in the 50s.  Discussed evaluation.  She does not feel needs to be seen at this time.  States feeling better.  Discussed f/u with cardiology for further cardiac evaluation and for f/u regarding her carotid disease.

## 2021-02-15 NOTE — Assessment & Plan Note (Signed)
On Crestor.  Low-cholesterol diet and exercise.  Follow-up lipid panel liver function test.

## 2021-02-15 NOTE — Assessment & Plan Note (Signed)
GFR 42 on recent check.  Avoid anti-inflammatories..  Stay hydrated.

## 2021-02-15 NOTE — Assessment & Plan Note (Signed)
No upper symptom reported.  On prilosec.  

## 2021-02-15 NOTE — Assessment & Plan Note (Signed)
Need to arrange f/u.  Overdue as outlined.

## 2021-02-17 NOTE — Telephone Encounter (Signed)
Patient calling back in and states she will be seeing them Wednesday at 9 am.

## 2021-02-17 NOTE — Telephone Encounter (Signed)
Called patient to let her know that Dr Nehemiah Massed can see her tomorrow at 10:00. Patient stated that she cannot do this appt time due to something with her husband. Advised that this appointment is really important and she should keep it if at all possible. Gave patient number to cardiology so she can call them to reschedule if needed.

## 2021-02-17 NOTE — Telephone Encounter (Signed)
Noted  

## 2021-02-17 NOTE — Telephone Encounter (Signed)
See more recent note from Dr Nicki Reaper.

## 2021-02-19 DIAGNOSIS — E782 Mixed hyperlipidemia: Secondary | ICD-10-CM | POA: Diagnosis not present

## 2021-02-19 DIAGNOSIS — I493 Ventricular premature depolarization: Secondary | ICD-10-CM | POA: Diagnosis not present

## 2021-02-19 DIAGNOSIS — G4733 Obstructive sleep apnea (adult) (pediatric): Secondary | ICD-10-CM | POA: Diagnosis not present

## 2021-02-19 DIAGNOSIS — I10 Essential (primary) hypertension: Secondary | ICD-10-CM | POA: Diagnosis not present

## 2021-02-19 DIAGNOSIS — I119 Hypertensive heart disease without heart failure: Secondary | ICD-10-CM | POA: Diagnosis not present

## 2021-02-19 DIAGNOSIS — R001 Bradycardia, unspecified: Secondary | ICD-10-CM | POA: Diagnosis not present

## 2021-02-19 DIAGNOSIS — I208 Other forms of angina pectoris: Secondary | ICD-10-CM | POA: Diagnosis not present

## 2021-02-19 DIAGNOSIS — R002 Palpitations: Secondary | ICD-10-CM | POA: Diagnosis not present

## 2021-02-20 ENCOUNTER — Other Ambulatory Visit: Payer: Self-pay | Admitting: Internal Medicine

## 2021-02-20 DIAGNOSIS — G4733 Obstructive sleep apnea (adult) (pediatric): Secondary | ICD-10-CM | POA: Diagnosis not present

## 2021-03-04 ENCOUNTER — Telehealth: Payer: Medicare Other

## 2021-03-05 ENCOUNTER — Telehealth: Payer: Medicare Other

## 2021-03-07 DIAGNOSIS — R002 Palpitations: Secondary | ICD-10-CM | POA: Diagnosis not present

## 2021-03-12 DIAGNOSIS — I208 Other forms of angina pectoris: Secondary | ICD-10-CM | POA: Diagnosis not present

## 2021-03-12 DIAGNOSIS — I6523 Occlusion and stenosis of bilateral carotid arteries: Secondary | ICD-10-CM | POA: Diagnosis not present

## 2021-03-12 DIAGNOSIS — R001 Bradycardia, unspecified: Secondary | ICD-10-CM | POA: Diagnosis not present

## 2021-03-17 ENCOUNTER — Ambulatory Visit (INDEPENDENT_AMBULATORY_CARE_PROVIDER_SITE_OTHER): Payer: Medicare Other | Admitting: Internal Medicine

## 2021-03-17 ENCOUNTER — Other Ambulatory Visit: Payer: Self-pay

## 2021-03-17 VITALS — BP 132/63 | HR 60 | Resp 16 | Ht 65.0 in | Wt 196.0 lb

## 2021-03-17 DIAGNOSIS — Z7189 Other specified counseling: Secondary | ICD-10-CM | POA: Diagnosis not present

## 2021-03-17 DIAGNOSIS — Z9989 Dependence on other enabling machines and devices: Secondary | ICD-10-CM | POA: Diagnosis not present

## 2021-03-17 DIAGNOSIS — G4733 Obstructive sleep apnea (adult) (pediatric): Secondary | ICD-10-CM

## 2021-03-17 DIAGNOSIS — I25119 Atherosclerotic heart disease of native coronary artery with unspecified angina pectoris: Secondary | ICD-10-CM

## 2021-03-17 DIAGNOSIS — I10 Essential (primary) hypertension: Secondary | ICD-10-CM | POA: Diagnosis not present

## 2021-03-17 NOTE — Progress Notes (Signed)
[facility] [place], [place] [address]  Pulmonary Sleep Medicine   Office Visit Note  Patient Name: [place] DOB: December 16, 1945 MRN [id number]    Chief Complaint: Obstructive Sleep Apnea visit  Brief History:  [name] is seen today for one year follow up The patient has a 7 year history of sleep apnea. Patient is using PAP nightly.  The patient feels better after sleeping with PAP.  The patient reports feeling good from PAP use. Reported sleepiness is  resolved and the Epworth Sleepiness Score is 1 out of 24. The patient occasionally takes naps approx . The patient complains of the following: nothing  The compliance download shows  compliance with an average use time of 7:59 hours @ 99%. The AHI is 1.1  The patient does not complain of limb movements disrupting sleep.  ROS  General: (-) fever, (-) chills, (-) night sweat Nose and Sinuses: (-) nasal stuffiness or itchiness, (-) postnasal drip, (-) nosebleeds, (-) sinus trouble. Mouth and Throat: (-) sore throat, (-) hoarseness. Neck: (-) swollen glands, (-) enlarged thyroid, (-) neck pain. Respiratory: - cough, - shortness of breath, - wheezing. Neurologic: - numbness, - tingling. Psychiatric: - anxiety, - depression   Current Medication: Outpatient Encounter Medications as of 03/17/2021  Medication Sig   acetaminophen (TYLENOL) 650 MG CR tablet Take 650 mg by mouth as needed for pain.   aspirin 325 MG tablet Take 325 mg by mouth daily.   carvedilol (COREG) 12.5 MG tablet TAKE 1 TABLET(12.5 MG) BY MOUTH TWICE DAILY WITH MEALS   Cholecalciferol 25 MCG (1000 UT) tablet Take 1,000 Units by mouth daily.    empagliflozin (JARDIANCE) 25 MG TABS tablet Take 1 tablet (25 mg total) by mouth daily before breakfast.   glucose blood (ONETOUCH VERIO) test strip Use to check blood sugars up to three times daily.   hydrALAZINE (APRESOLINE) 10 MG tablet TAKE 1 TABLET(10 MG) BY MOUTH THREE TIMES DAILY    losartan-hydrochlorothiazide (HYZAAR) 100-25 MG tablet TAKE 1 TABLET BY MOUTH DAILY   omeprazole (PRILOSEC) 40 MG capsule TAKE 1 CAPSULE(40 MG) BY MOUTH DAILY   OneTouch Delica Lancets 37T MISC Use to check blood sugars up to 3 times daily.   rosuvastatin (CRESTOR) 40 MG tablet Take 1 tablet (40 mg total) by mouth daily.   vitamin E 400 UNIT capsule Take 400 Units by mouth daily.   No facility-administered encounter medications on file as of 03/17/2021.    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 HER2 negative.  Wide excision with SLN biopsy.  MammoSite radiation   CARPAL TUNNEL RELEASE     CHOLECYSTECTOMY  2006   COLONOSCOPY  2010   Dr. Jamal Collin   COLONOSCOPY WITH PROPOFOL N/A 05/17/2017   Procedure: COLONOSCOPY WITH PROPOFOL;  Surgeon: Manya Silvas, MD;  Location: [facility] ENDOSCOPY;  Service: Endoscopy;  Laterality: N/A;   DILATION AND CURETTAGE OF UTERUS     ESOPHAGOGASTRODUODENOSCOPY (EGD) WITH PROPOFOL N/A 05/17/2017   Procedure: ESOPHAGOGASTRODUODENOSCOPY (EGD) WITH PROPOFOL;  Surgeon: Manya Silvas, MD;  Location[facility] ENDOSCOPY;  Service:  Endoscopy;  Laterality: N/A;   LAPAROTOMY FOR REMOVAL TUMOR LUMBAR PLEXES     Leg stent  2011   MOLE REMOVAL  2013   15 removed   POSTERIOR CERVICAL LAMINECTOMY Left 10/15/2015   Procedure: Left Cervical four- five Hemilaminectomy/Remove mass;  Surgeon: Leeroy Cha, MD;  Location: [facility] NEURO ORS;  Service: Neurosurgery;  Laterality: Left;  Left C4-5 Hemilaminectomy/Remove mass    Medical History: Past Medical History:  Diagnosis Date   Arthritis    Breast cancer ([place])    Breast cancer, left ([place]) 2016   LT LUMPECTOMY -  TI, NO, MO - IDC, ER/PR pos, Her 2 neg, Rad tx's.    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   CHF (congestive heart failure) (HCC)    COPD (chronic obstructive pulmonary disease) (HCC)    Coronary artery disease    Depression    Diffuse cystic mastopathy 2013   DVT (deep venous thrombosis) (HCC)    Dyspnea    Elevated TSH    Family history of adverse reaction to anesthesia    Pt stated that son had a seizure with a combination of anesthesia and pain medication."   Fatty liver    GERD (gastroesophageal reflux disease)    Glaucoma    High cholesterol    History of chicken pox    Hyperglycemia    Hyperlipidemia    Hypertension    LVH (left ventricular hypertrophy)    PAC (premature atrial contraction)    Palpitations    Peripheral vascular disease (HCC)    Personal history of radiation therapy 2016   BREAST CA   Pneumonia March 2014   Skin cancer 2013   Sleep apnea    wears CPAP set at 2.5   Wears glasses     Family History: Non contributory to the present illness  Social History: Social History   Socioeconomic History   Marital status: Married    Spouse name: Not on file   Number of children: 3   Years of education: 12   Highest education level: Not on file  Occupational History   Occupation: Caregiver   Tobacco Use   Smoking status: Never   Smokeless tobacco: Never  Substance and Sexual Activity   Alcohol use: No    Alcohol/week: 0.0 standard drinks   Drug use: No   Sexual activity: Never  Other Topics Concern   Not on file  Social History Narrative   Regular exercise-mo   Caffeine Use-no   Social Determinants of Health   Financial Resource Strain: High Risk   Difficulty of Paying Living Expenses: Hard  Food Insecurity: No Food Insecurity   Worried About Charity fundraiser in the Last Year: Never true   Ran Out of Food in the Last Year: Never true  Transportation  Needs: No Transportation Needs   Lack of Transportation (Medical): No   Lack of Transportation (Non-Medical): No  Physical Activity: Sufficiently Active   Days of Exercise per Week: 7 days   Minutes of Exercise per Session: 30 min  Stress: No Stress Concern Present   Feeling of Stress : Only a little  Social Connections: Engineer, building services of Communication with Friends and Family: More than three times a week   Frequency of Social Gatherings with Friends and Family: More than three times a week   Attends Religious Services: More than 4 times per year  Active Member of Clubs or Organizations: Yes   Attends Music therapist: More than 4 times per year   Marital Status: Married  Human resources officer Violence: Not At Risk   Fear of Current or Ex-Partner: No   Emotionally Abused: No   Physically Abused: No   Sexually Abused: No    Vital Signs: There were no vitals taken for this visit.  Examination: General Appearance: The patient is well-developed, well-nourished, and in no distress. Neck Circumference: 41 Skin: Gross inspection of skin unremarkable. Head: normocephalic, no gross deformities. Eyes: no gross deformities noted. ENT: ears appear grossly normal Neurologic: Alert and oriented. No involuntary movements.    EPWORTH SLEEPINESS SCALE:  Scale:  (0)= no chance of dozing; (1)= slight chance of dozing; (2)= moderate chance of dozing; (3)= high chance of dozing  Chance  Situtation    Sitting and reading: 0    Watching TV: 0    Sitting Inactive in public: 0    As a passenger in car: 0      Lying down to rest: 0    Sitting and talking: 0    Sitting quielty after lunch: 1    In a car, stopped in traffic: 0   TOTAL SCORE:   1 out of 24    SLEEP STUDIES:  PSG 03/2014 AHI 65 SpO29min 83%   CPAP COMPLIANCE DATA:  Date Range: 03/12/20 - 03/11/21  Average Daily Use: 7:59 hours  Median Use: 8:10 hours  Compliance for > 4 Hours:  99%  AHI: 1.0 respiratory events per hour  Days Used: 364/365  Mask Leak: 40 lpm  95th Percentile Pressure: 16.9cmH2O         LABS: Recent Results (from the past 2160 hour(s))  Surgical pathology     Status: None   Collection Time: 01/02/21 12:00 AM  Result Value Ref Range   SURGICAL PATHOLOGY      Surgical Pathology CASE: [id number] PATIENT: [facility] Surgical Pathology Report     Specimen Submitted: A. Ear, left  Clinical History: D23.22      DIAGNOSIS: A. SKIN, LEFT EAR; BIOPSY: - SEBORRHEIC KERATOSIS. - NEGATIVE FOR MALIGNANCY.   GROSS DESCRIPTION: A. Labeled: Left ear Received: Formalin Collection time: 10:45 AM on 01/02/2021 Placed into formalin time: 10:45 AM on 01/02/2021 Tissue fragment(s): 1 Size: 0.9 x 0.4 x 0.2 cm Description: Received is a fragment of tan finely lobulated skin.  The presumed resection margin is inked blue and the specimen is trisected. Entirely submitted in 1 cassette.  RB 01/03/2021   Final Diagnosis performed by Betsy Pries, MD.   Electronically signed 01/06/2021 12:49:58PM The electronic signature indicates that the named Attending Pathologist has evaluated the specimen Technical component performed at [facility], [address] [place], [place] [address] Lab: [phone] Dir: Rush Farmer, MD, MMM  Pr ofessional component performed at [facility], [facility], [place], [place], [place] [address] Lab: [phone] Dir: Dellia Nims. Rubinas, MD   TSH     Status: Abnormal   Collection Time: 02/04/21  7:53 AM  Result Value Ref Range   TSH 6.12 (H) 0.35 - 5.50 uIU/mL  Lipid panel     Status: Abnormal   Collection Time: 02/04/21  7:53 AM  Result Value Ref Range   Cholesterol 142 0 - 200 mg/dL    Comment: ATP III Classification       Desirable:  < 200 mg/dL               Borderline  High:  200 - 239 mg/dL          High:  > = 240 mg/dL   Triglycerides 224.0 (H) 0.0 - 149.0 mg/dL    Comment:  Normal:  <150 mg/dLBorderline High:  150 - 199 mg/dL   HDL 36.80 (L) >39.00 mg/dL   VLDL 44.8 (H) 0.0 - 40.0 mg/dL   Total CHOL/HDL Ratio 4     Comment:                Men          Women1/2 Average Risk     3.4          3.3Average Risk          5.0          4.42X Average Risk          9.6          7.13X Average Risk          15.0          11.0                       NonHDL 105.35     Comment: NOTE:  Non-HDL goal should be 30 mg/dL higher than patient's LDL goal (i.e. LDL goal of < 70 mg/dL, would have non-HDL goal of < 100 mg/dL)  Hepatic function panel     Status: None   Collection Time: 02/04/21  7:53 AM  Result Value Ref Range   Total Bilirubin 0.6 0.2 - 1.2 mg/dL   Bilirubin, Direct 0.1 0.0 - 0.3 mg/dL   Alkaline Phosphatase 50 39 - 117 U/L   AST 14 0 - 37 U/L   ALT 15 0 - 35 U/L   Total Protein 6.9 6.0 - 8.3 g/dL   Albumin 4.2 3.5 - 5.2 g/dL  Hemoglobin A1c     Status: Abnormal   Collection Time: 02/04/21  7:53 AM  Result Value Ref Range   Hgb A1c MFr Bld 7.1 (H) 4.6 - 6.5 %    Comment: Glycemic Control Guidelines for People with Diabetes:Non Diabetic:  <6%Goal of Therapy: <7%Additional Action Suggested:  >9%   Basic metabolic panel     Status: Abnormal   Collection Time: 02/04/21  7:53 AM  Result Value Ref Range   Sodium 140 135 - 145 mEq/L   Potassium 4.1 3.5 - 5.1 mEq/L   Chloride 104 96 - 112 mEq/L   CO2 25 19 - 32 mEq/L   Glucose, Bld 115 (H) 70 - 99 mg/dL   BUN 24 (H) 6 - 23 mg/dL   Creatinine, Ser 1.24 (H) 0.40 - 1.20 mg/dL   GFR 42.28 (L) >60.00 mL/min    Comment: Calculated using the CKD-EPI Creatinine Equation (2021)   Calcium 9.2 8.4 - 10.5 mg/dL  CBC with Differential/Platelet     Status: None   Collection Time: 02/04/21  7:53 AM  Result Value Ref Range   WBC 5.8 4.0 - 10.5 K/uL   RBC 4.14 3.87 - 5.11 Mil/uL   Hemoglobin 13.0 12.0 - 15.0 g/dL   HCT 37.7 36.0 - 46.0 %   MCV 91.1 78.0 - 100.0 fl   MCHC 34.3 30.0 - 36.0 g/dL   RDW 13.4 11.5 - 15.5 %    Platelets 168.0 150.0 - 400.0 K/uL   Neutrophils Relative % 52.7 43.0 - 77.0 %   Lymphocytes Relative 35.1 12.0 - 46.0 %   Monocytes Relative 9.2 3.0 -  12.0 %   Eosinophils Relative 2.4 0.0 - 5.0 %   Basophils Relative 0.6 0.0 - 3.0 %   Neutro Abs 3.1 1.4 - 7.7 K/uL   Lymphs Abs 2.0 0.7 - 4.0 K/uL   Monocytes Absolute 0.5 0.1 - 1.0 K/uL   Eosinophils Absolute 0.1 0.0 - 0.7 K/uL   Basophils Absolute 0.0 0.0 - 0.1 K/uL  LDL cholesterol, direct     Status: None   Collection Time: 02/04/21  7:53 AM  Result Value Ref Range   Direct LDL 74.0 mg/dL    Comment: Optimal:  <100 mg/dLNear or Above Optimal:  100-129 mg/dLBorderline High:  130-159 mg/dLHigh:  160-189 mg/dLVery High:  >190 mg/dL    Radiology: DG Bone Density  Result Date: 12/18/2020 EXAM: DUAL X-RAY ABSORPTIOMETRY (DXA) FOR BONE MINERAL DENSITY IMPRESSION: Dear Dr. Nicki Reaper, Your patient Lorelee Market completed a FRAX assessment on 12/18/2020 using the [place] (analysis version: 14.10) manufactured by [employer]. The following summarizes the results of our evaluation. PATIENT BIOGRAPHICAL: Name: Jonice, [place] Patient ID: [id number] Birth Date: [birth year] Height:    64.0 in. Gender:     Female    [age]       Weight:    195.6 lbs. Ethnicity:  White                            Exam Date: 12/18/2020 FRAX* RESULTS:  (version: 3.5) 10-year Probability of Fracture1 Major Osteoporotic Fracture2 Hip Fracture 17.8% 3.7% Population: [place] (Caucasian) Risk Factors: History of Fracture (Adult) Based on Femur (Right) Neck BMD 1 -The 10-year probability of fracture may be lower than reported if the patient has received treatment. 2 -Major Osteoporotic Fracture: Clinical Spine, Forearm, Hip or Shoulder *FRAX is a Materials engineer of the [employer] for Metabolic Bone Disease, a [place] (WHO) [facility]. ASSESSMENT: The probability of a major osteoporotic fracture is 17.8%  within the next ten years. The probability of a hip fracture is 3.7% within the next ten years. . Your patient Kasara Schomer completed a BMD test on 12/18/2020 using the [place] (software version: 14.10) manufactured by [employer]. The following summarizes the results of our evaluation. Technologist: [facility] PATIENT BIOGRAPHICAL: Name: Zyiah, [place] Patient ID: [id number] Birth Date: 1946-03-23 Height: 64.0 in. Gender: Female Exam Date: 12/18/2020 Weight: 195.6 lbs. Indications: Advanced Age, Caucasian, Diabetic, Early Menopause, Height Loss, History of Breast Cancer, History of Fracture (Adult), History of Radiation, Postmenopausal Fractures: Left finger, Left wrist Treatments: aspirin, Jardiance, Omeprazole, Vitamin D DENSITOMETRY RESULTS: Site          Region     Measured Date Measured Age WHO Classification Young Adult T-score BMD         %Change vs. Previous Significant Change (*) DualFemur Neck Right 12/18/2020 76.3 Osteopenia -1.7 0.801 g/cm2 -0.7% - DualFemur Neck Right 10/05/2017 73.1 Osteopenia -1.7 0.807 g/cm2 -1.3% - DualFemur Neck Right 02/01/2014 69.4 Osteopenia -1.6 0.818 g/cm2 - - DualFemur Total Mean 12/18/2020 76.3 Osteopenia -1.1 0.874 g/cm2 0.2% - DualFemur Total Mean 10/05/2017 73.1 Osteopenia -1.1 0.872 g/cm2 0.5% - DualFemur Total Mean 02/01/2014 69.4 Osteopenia -1.1 0.868 g/cm2 - - Right Forearm Radius 33% 12/18/2020 76.3 Normal -0.8 0.806 g/cm2 -8.1% Yes Right Forearm Radius 33% 02/01/2014 69.4 Normal 0.0 0.877 g/cm2 - - ASSESSMENT: The BMD measured at Femur Neck Right is 0.801 g/cm2 with  a T-score of -1.7. This patient is considered osteopenic according to [facility]) criteria. The scan quality is good. Lumbar spine was not utilized due to advanced degenerative changes. Compared with prior study, there has been no significant change in the total hip. World Pharmacologist [facility]) criteria for post-menopausal, Caucasian Women: Normal:                    T-score at or above -1 SD Osteopenia/low bone mass: T-score between -1 and -2.5 SD Osteoporosis:             T-score at or below -2.5 SD RECOMMENDATIONS: 1. All patients should optimize calcium and vitamin D intake. 2. Consider FDA-approved medical therapies in postmenopausal women and men aged 38 years and older, based on the following: a. A hip or vertebral(clinical or morphometric) fracture b. T-score < -2.5 at the femoral neck or spine after appropriate evaluation to exclude secondary causes c. Low bone mass (T-score between -1.0 and -2.5 at the femoral neck or spine) and a 10-year probability of a hip fracture > 3% or a 10-year probability of a major osteoporosis-related fracture > 20% based on the [place]-adapted WHO algorithm 3. Clinician judgment and/or patient preferences may indicate treatment for people with 10-year fracture probabilities above or below these levels FOLLOW-UP: People with diagnosed cases of osteoporosis or at high risk for fracture should have regular bone mineral density tests. For patients eligible for Medicare, routine testing is allowed once every 2 years. The testing frequency can be increased to one year for patients who have rapidly progressing disease, those who are receiving or discontinuing medical therapy to restore bone mass, or have additional risk factors. I have reviewed this report, and agree with the above findings. Mark A. Thornton Papas, M.D. [facility] Electronically Signed   By: Lavonia Dana M.D.   On: 12/18/2020 14:51   MM 3D SCREEN BREAST BILATERAL  Result Date: 12/18/2020 CLINICAL DATA:  Screening. EXAM: DIGITAL SCREENING BILATERAL MAMMOGRAM WITH TOMOSYNTHESIS AND CAD TECHNIQUE: Bilateral screening digital craniocaudal and mediolateral oblique mammograms were obtained. Bilateral screening digital breast tomosynthesis was performed. The images were evaluated with computer-aided detection. COMPARISON:  Previous exam(s). ACR Breast Density Category c: The  breast tissue is heterogeneously dense, which may obscure small masses. FINDINGS: There are no findings suspicious for malignancy. IMPRESSION: No mammographic evidence of malignancy. A result letter of this screening mammogram will be mailed directly to the patient. RECOMMENDATION: Screening mammogram in one year. (Code:SM-B-01Y) BI-RADS CATEGORY  1: Negative. Electronically Signed   By: Ammie Ferrier M.D.   On: 12/18/2020 14:54   No results found.  No results found.    Assessment and Plan: Patient Active Problem List   Diagnosis Date Noted   CKD (chronic kidney disease), stage III ([place]) 10/28/2020   Vaginal lesion 04/15/2020   Carcinoma of lower-outer quadrant of left breast in female, estrogen receptor positive ([place]) 02/26/2020   History of COVID-19 01/07/2020   Cracked skin on feet 10/01/2019   History of colon polyps 10/01/2019   Right groin pain 07/09/2019   Seroma of breast 03/30/2018   Abdominal pain 02/27/2018   Bradycardia 10/18/2017   Frequent PVCs 10/18/2017   OSA (obstructive sleep apnea) 10/18/2017   COPD (chronic obstructive pulmonary disease) ([place]) 08/26/2017   PAC (premature atrial contraction) 03/22/2017   Palpitations 03/22/2017   Back pain 10/14/2016   Diabetes mellitus with cardiac complication ([place]) 58/01/9832   Bilateral leg edema 06/04/2016   Carpal tunnel syndrome  04/12/2016   Lymphedema 12/28/2015   Carotid artery disease ([place]) 12/28/2015   Fullness of breast 12/19/2015   Musculoskeletal pain 10/18/2015   S/P spinal surgery 10/18/2015   Cervical spine tumor 10/15/2015   Neck pain 10/10/2015   Hypertensive left ventricular hypertrophy 06/26/2015   Trigger middle finger of right hand 05/27/2015   Chest pain 05/08/2015   SOB (shortness of breath) 05/08/2015   Trigger finger, right 05/08/2015   Combined fat and carbohydrate induced hyperlipemia 05/08/2015   Pressure in head 02/08/2015   Hypercholesterolemia 01/02/2015   History of breast cancer  11/06/2014   Health care maintenance 11/06/2014   Ankle swelling 11/06/2014   Stress 11/06/2014   Neck fullness 06/29/2014   Osteopenia 03/31/2014   Snoring 11/05/2013   Skin lesions 11/05/2013   Light headedness 10/30/2013   Abnormal liver function test 08/05/2012   Thyroid nodule 05/31/2012   CAD (coronary artery disease) 05/31/2012   Hypertension 02/24/2012   Peripheral vascular disease ([place]) 02/24/2012   GERD (gastroesophageal reflux disease) 02/24/2012    1. OSA on CPAP The patient does tolerate PAP and reports  benefit from PAP use. The patient was reminded how to clean equipment and advised to replace supplies routinely. The patient was also counselled on weight loss. The compliance is excellent. The AHI is 1.0.   OSA- continue with excellent compliance with pap. F/u one year.    2. CPAP use counseling CPAP Counseling: had a lengthy discussion with the patient regarding the importance of PAP therapy in management of the sleep apnea. Patient appears to understand the risk factor reduction and also understands the risks associated with untreated sleep apnea. Patient will try to make a good faith effort to remain compliant with therapy. Also instructed the patient on proper cleaning of the device including the water must be changed daily if possible and use of distilled water is preferred. Patient understands that the machine should be regularly cleaned with appropriate recommended cleaning solutions that do not damage the PAP machine for example given white vinegar and water rinses. Other methods such as ozone treatment may not be as good as these simple methods to achieve cleaning.   3. Primary hypertension Hypertension Counseling:   The following hypertensive lifestyle modification were recommended and discussed:  1. Limiting alcohol intake to less than 1 oz/day of ethanol:(24 oz of beer or 8 oz of wine or 2 oz of 100-proof whiskey). 2. Take baby ASA 81 mg daily. 3. Importance  of regular aerobic exercise and losing weight. 4. Reduce dietary saturated fat and cholesterol intake for overall cardiovascular health. 5. Maintaining adequate dietary potassium, calcium, and magnesium intake. 6. Regular monitoring of the blood pressure. 7. Reduce sodium intake to less than 100 mmol/day (less than 2.3 gm of sodium or less than 6 gm of sodium choride)    4. Coronary artery disease involving native coronary artery of native heart with angina pectoris ([place]) Stable without angina.     General Counseling: I have discussed the findings of the evaluation and examination with Marlette.  I have also discussed any further diagnostic evaluation thatmay be needed or ordered today. Teresita verbalizes understanding of the findings of todays visit. We also reviewed her medications today and discussed drug interactions and side effects including but not limited excessive drowsiness and altered mental states. We also discussed that there is always a risk not just to her but also people around her. she has been encouraged to call the office with any questions or concerns that  should arise related to todays visit.  No orders of the defined types were placed in this encounter.       I have personally obtained a history, examined the patient, evaluated laboratory and imaging results, formulated the assessment and plan and placed orders. This patient was seen today by Tressie Ellis, PA-C in collaboration with Dr. Devona Konig.   Allyne Gee, MD [facility] Diplomate ABMS Pulmonary Critical Care Medicine and Sleep Medicine

## 2021-03-17 NOTE — Patient Instructions (Signed)

## 2021-03-19 DIAGNOSIS — I10 Essential (primary) hypertension: Secondary | ICD-10-CM | POA: Diagnosis not present

## 2021-03-19 DIAGNOSIS — E782 Mixed hyperlipidemia: Secondary | ICD-10-CM | POA: Diagnosis not present

## 2021-03-19 DIAGNOSIS — I6523 Occlusion and stenosis of bilateral carotid arteries: Secondary | ICD-10-CM | POA: Diagnosis not present

## 2021-03-19 DIAGNOSIS — G4733 Obstructive sleep apnea (adult) (pediatric): Secondary | ICD-10-CM | POA: Diagnosis not present

## 2021-03-19 DIAGNOSIS — R002 Palpitations: Secondary | ICD-10-CM | POA: Diagnosis not present

## 2021-03-19 DIAGNOSIS — I493 Ventricular premature depolarization: Secondary | ICD-10-CM | POA: Diagnosis not present

## 2021-03-30 ENCOUNTER — Other Ambulatory Visit: Payer: Self-pay | Admitting: Internal Medicine

## 2021-03-31 ENCOUNTER — Ambulatory Visit (INDEPENDENT_AMBULATORY_CARE_PROVIDER_SITE_OTHER): Payer: Medicare Other | Admitting: Pharmacist

## 2021-03-31 DIAGNOSIS — I10 Essential (primary) hypertension: Secondary | ICD-10-CM

## 2021-03-31 DIAGNOSIS — E1159 Type 2 diabetes mellitus with other circulatory complications: Secondary | ICD-10-CM

## 2021-03-31 DIAGNOSIS — I739 Peripheral vascular disease, unspecified: Secondary | ICD-10-CM

## 2021-03-31 DIAGNOSIS — N1831 Chronic kidney disease, stage 3a: Secondary | ICD-10-CM

## 2021-03-31 NOTE — Patient Instructions (Signed)
Swetha,   We will work on reapplying for patient assistance for [employer].   Check your blood sugars twice daily:  1) Fasting, first thing in the morning before breakfast and  2) 2 hours after your largest meal.   For a goal A1c of less than 7%, goal fasting readings are less than 130 and goal 2 hour after meal readings are less than 180.   I would recommend we add a medication to better treat your cholesterol and prevent further build up. I would recommend adding ezetimibe (Zetia) once daily. This medication is generally very well tolerated. It may cause some stomach upset, which can be helped by taking it with food. Let us know when you would be willing to add this.   Take care!  Catie Darnelle Maffucci, PharmD   Visit Information  Following are the goals we discussed today:  Patient Goals/Self-Care Activities Over the next 90 days, patient will:  - take medications as prescribed check blood glucose daily to twice daily, document, and provide at future appointments check blood pressure periodically, document, and provide at future appointments collaborate with provider on medication access solutions        Plan: Telephone follow up appointment with care management team member scheduled for:  4 months   Catie Darnelle Maffucci, PharmD, [place], CPP Clinical Pharmacist [place] at [facility] [phone]   Please call the care guide team at [phone] if you need to cancel or reschedule your appointment.   The patient verbalized understanding of instructions, educational materials, and care plan provided today and agreed to receive a mailed copy of patient instructions, educational materials, and care plan.

## 2021-03-31 NOTE — Chronic Care Management (AMB) (Signed)
Chronic Care Management CCM Pharmacy Note  03/31/2021 Name:  Lindsey Bentley MRN:  [id number] DOB:  [birth year]  Summary: - A1c improved, not at goal. Patient declines additional therapy at this time - Lipids not at goal given atherosclerosis.   Recommendations/Changes made from today's visit: - Recommend addition of ezetimibe moving forward - Reapplying for Jardiance assistance for 2023  Subjective: Lindsey Bentley is an 75 y.o. year old female who is a primary patient of Lindsey Pheasant, MD.  The CCM team was consulted for assistance with disease management and care coordination needs.    Engaged with patient by telephone for follow up visit for pharmacy case management and/or care coordination services.   Objective:  Medications Reviewed Today     Reviewed by [name], RPH-CPP (Pharmacist) on 03/31/21 at 67  Med List Status: <None>   Medication Order Taking? Sig Documenting Provider Last Dose Status Informant  acetaminophen (TYLENOL) 650 MG CR tablet [id number]  Take 650 mg by mouth as needed for pain. [provider]  Active   aspirin 325 MG tablet [id number] Yes Take 325 mg by mouth daily. [provider] Taking Active Self  carvedilol (COREG) 12.5 MG tablet [id number] Yes TAKE 1 TABLET(12.5 MG) BY MOUTH TWICE DAILY WITH MEALS Lindsey Pheasant, MD Taking Active   Cholecalciferol 25 MCG (1000 UT) tablet [id number] Yes Take 1,000 Units by mouth daily.  [provider] Taking Active Self           Med Note Meda Coffee, Rex Kras   Wed Oct 09, 2015  4:29 PM)    empagliflozin (JARDIANCE) 25 MG TABS tablet [id number] Yes Take 1 tablet (25 mg total) by mouth daily before breakfast. Lindsey Pheasant, MD Taking Active   glucose blood (ONETOUCH VERIO) test strip [id number]  Use to check blood sugars up to three times daily. Lindsey Pheasant, MD  Active   hydrALAZINE (APRESOLINE) 10 MG tablet [id number] Yes TAKE 1 TABLET(10 MG) BY MOUTH THREE TIMES DAILY Lindsey Pheasant, MD Taking Active   losartan-hydrochlorothiazide [facility]) 100-25 MG tablet [id number] Yes TAKE 1 TABLET BY MOUTH DAILY Lindsey Pheasant, MD Taking Active   omeprazole (PRILOSEC) 40 MG capsule [id number] Yes TAKE 1 CAPSULE(40 MG) BY MOUTH DAILY Lindsey Pheasant, MD Taking Active   OneTouch Delica Lancets 98X MISC [id number] Yes Use to check blood sugars up to 3 times daily. Lindsey Pheasant, MD Taking Active   rosuvastatin (CRESTOR) 40 MG tablet [id number] Yes Take 1 tablet (40 mg total) by mouth daily. Lindsey Pheasant, MD Taking Active   vitamin E 400 UNIT capsule [id number] Yes Take 400 Units by mouth daily. [provider] Taking Active Self            Pertinent Labs:   Lab Results  Component Value Date   HGBA1C 7.1 (H) 02/04/2021   Lab Results  Component Value Date   CHOL 142 02/04/2021   HDL 36.80 (L) 02/04/2021   LDLCALC 64 04/15/2017   LDLDIRECT 74.0 02/04/2021   TRIG 224.0 (H) 02/04/2021   CHOLHDL 4 02/04/2021   Lab Results  Component Value Date   CREATININE 1.24 (H) 02/04/2021   BUN 24 (H) 02/04/2021   NA 140 02/04/2021   K 4.1 02/04/2021   CL 104 02/04/2021   CO2 25 02/04/2021    SDOH:  (Social Determinants of Health) assessments and interventions performed:  SDOH Interventions    Flowsheet Row Most Recent Value  SDOH Interventions   Financial Strain Interventions Intervention Not Indicated  CCM Care Plan  Review of patient past medical history, allergies, medications, health status, including review of consultants reports, laboratory and other test data, was performed as part of comprehensive evaluation and provision of chronic care management services.   Care Plan : Medication Management  Updates made by [name], RPH-CPP since 03/31/2021 12:00 AM     Problem: Diabetes, Hypertension, Hyperlipidemia      Long-Range Goal: Disease Progression Prevention   This Visit's Progress: On track  Recent Progress: On track  Priority:  High  Note:   Current Barriers:  Unable to independently afford treatment regimen Unable to maintain control of diabetes, hyperlipidemia  Pharmacist Clinical Goal(s):  Over the next 90 days, patient will verbalize ability to afford treatment regimen. Over the next 90 days, patient will achieve adherence to monitoring guidelines and medication adherence to achieve therapeutic efficacy. Over the next 90 days, patient will achieve control of diabetes and hyperlipidemia as evidenced by improvement in lab work through collaboration with PharmD and provider.   Interventions: 1:1 collaboration with Lindsey Pheasant, MD regarding development and update of comprehensive plan of care as evidenced by provider attestation and co-signature Inter-disciplinary care team collaboration (see longitudinal plan of care) Comprehensive medication review performed; medication list updated in electronic medical record  Health Maintenance   Yearly diabetic eye exam: up to date Yearly diabetic foot exam: up to date Urine microalbumin: up to date Yearly influenza vaccination: due - declines Td/Tdap vaccination: due - declines Pneumonia vaccination: up to date COVID vaccinations: due - declines Shingrix vaccinations: due - declines Colonoscopy: due - postponed Bone density scan: up to date Mammogram: up to date  Diabetes  Uncontrolled but improved current treatment: Jardiance 25 mg daily  Hx intolerance to metformin (including XR), loose stools Current glucose readings: has not been checking recently Most recent weight: 195 lbs - maintaining recently Current meal patterns: breakfast: eggs, bacon, coffee (2% milk), 2 pieces of toast, sometimes with jelly; lunch; doesn't eat much, sometimes a banana, tomato sandwich, sometimes soup; snacks: occasional cheese, crackers, popcorn, peanuts; supper: meat + 2 vegetables, no bread, water; focuses on smaller portion sizes; desserts: infrequently Current physical  activity: busy with caring for husband (recent health issues) Previously discussed addition of GLP1. Patient not interested in adding medication at this time Will collaborate w/ CPhT, patient, and providers to reapply for patient assistance for Jardiance for 2023.  Hypertension: Controlled per last clinic reading and home readings; current treatment: hydralazine 10 mg TID, losartan/HCTZ 100/25 mg daily, carvedilol 12.5 mg BID Home BP readings: maintaining ~ 120s-130s/60s; reports higher reading at cardiology recently Reviewed recently cardiology work up.  Recommended to continue current regimen along with periodic BP monitoring  Hyperlipidemia, vascular disease, cartoid  Uncontrolled, goal <70 likely appropriate given PVD, carotid atherosclerosis; current treatment: rosuvastatin 40 mg daily Antiplatelet regimen: aspirin 325 mg daily (recommended by vascular) Recommended addition of ezetimibe to target more stringent lipid control and ASCVD risk factor reduction. Patient declines to add medication at this time.  Recommended to continue current regimen at this time  GERD: Controlled per patient report; current regimen: omeprazole 40 mg daily Previously recommended to continue current regimen at this time  Patient Goals/Self-Care Activities Over the next 90 days, patient will:  - take medications as prescribed check blood glucose daily to twice daily, document, and provide at future appointments check blood pressure periodically, document, and provide at future appointments collaborate with provider on medication access solutions      Plan: Telephone follow  up appointment with care management team member scheduled for:  4 months  Catie Darnelle Maffucci, PharmD, [place], [place] Clinical Pharmacist [employer] at [employer] [phone]

## 2021-04-01 ENCOUNTER — Telehealth: Payer: Self-pay | Admitting: Pharmacy Technician

## 2021-04-01 DIAGNOSIS — Z596 Low income: Secondary | ICD-10-CM

## 2021-04-01 NOTE — Progress Notes (Signed)
[place] [facility])                                            [place] Team    04/01/2021  Lindsey Bentley 07-17-1945 [id number]  FOR 2023 RE ENROLLMENT                                      Medication Assistance Referral  Referral From: [facility] Embedded RPh [name]   Medication/Company: [name] Patient application portion:  Mailed Provider application portion: Interoffice Mailed to Dr. Nicki Reaper Provider address/fax verified via: Office website   [name] [name], [name]  [phone]

## 2021-04-09 DIAGNOSIS — N1831 Chronic kidney disease, stage 3a: Secondary | ICD-10-CM

## 2021-04-09 DIAGNOSIS — E1159 Type 2 diabetes mellitus with other circulatory complications: Secondary | ICD-10-CM

## 2021-04-09 DIAGNOSIS — I10 Essential (primary) hypertension: Secondary | ICD-10-CM

## 2021-04-21 ENCOUNTER — Telehealth: Payer: Self-pay | Admitting: Internal Medicine

## 2021-04-21 ENCOUNTER — Telehealth: Payer: Self-pay | Admitting: Pharmacy Technician

## 2021-04-21 DIAGNOSIS — Z596 Low income: Secondary | ICD-10-CM

## 2021-04-21 NOTE — Progress Notes (Signed)
[place] [facility])                                            [place] Team    04/21/2021  HARLOWE DOWLER [birth year] [id number]  Received both patient and provider portion(s) of patient assistance application(s) for Jardiance. Faxed completed application and required documents into [employer].   Jeannetta Cerutti P. Chaysen Tillman, [place]  [phone]

## 2021-04-23 NOTE — Telephone Encounter (Signed)
Pt called in stating that she needs a prior auth on medication (hydrALAZINE (APRESOLINE) 10 MG tablet) and (rosuvastatin (CRESTOR) 40 MG tablet). Pt stated that the pharmacy have sent over the request already. Pt stated she was checking in to see if the medications was refilled and approve. Pt requesting call back

## 2021-04-24 ENCOUNTER — Other Ambulatory Visit: Payer: Self-pay

## 2021-04-24 MED ORDER — HYDRALAZINE HCL 10 MG PO TABS
ORAL_TABLET | ORAL | 1 refills | Status: DC
Start: 2021-04-24 — End: 2021-10-26

## 2021-04-25 NOTE — Telephone Encounter (Signed)
Lmtcb. Medications were sent in

## 2021-04-25 NOTE — Telephone Encounter (Signed)
Patient aware that medications were sent in

## 2021-04-30 ENCOUNTER — Ambulatory Visit: Payer: Medicare Other | Admitting: Pharmacist

## 2021-04-30 DIAGNOSIS — I25119 Atherosclerotic heart disease of native coronary artery with unspecified angina pectoris: Secondary | ICD-10-CM

## 2021-04-30 DIAGNOSIS — E1159 Type 2 diabetes mellitus with other circulatory complications: Secondary | ICD-10-CM

## 2021-04-30 MED ORDER — EMPAGLIFLOZIN 25 MG PO TABS: 25.0000 mg | ORAL_TABLET | Freq: Every day | ORAL | 3 refills | Status: DC

## 2021-04-30 NOTE — Chronic Care Management (AMB) (Signed)
Chronic Care Management CCM Pharmacy Note  04/30/2021 Name:  Lindsey Bentley MRN:  [id number] DOB:  03-Jan-1946  Summary: - Per CPhT, faxed script for Jardiance required by [facility] Cares  Recommendations/Changes made from today's visit: - Faxed script as above  Subjective: Lindsey Bentley is an 75 y.o. year old female who is a primary patient of Einar Pheasant, MD.  The CCM team was consulted for assistance with disease management and care coordination needs.    Care coordination  for  medication access  for pharmacy case management and/or care coordination services.   Objective:  Medications Reviewed Today     Reviewed by [name], RPH-CPP (Pharmacist) on 03/31/21 at 31  Med List Status: <None>   Medication Order Taking? Sig Documenting Provider Last Dose Status Informant  acetaminophen (TYLENOL) 650 MG CR tablet [id number]  Take 650 mg by mouth as needed for pain. [provider]  Active   aspirin 325 MG tablet [id number] Yes Take 325 mg by mouth daily. [provider] Taking Active Self  carvedilol (COREG) 12.5 MG tablet [id number] Yes TAKE 1 TABLET(12.5 MG) BY MOUTH TWICE DAILY WITH MEALS Einar Pheasant, MD Taking Active   Cholecalciferol 25 MCG (1000 UT) tablet [id number] Yes Take 1,000 Units by mouth daily.  [provider] Taking Active Self           Med Note Meda Coffee, Rex Kras   Wed Oct 09, 2015  4:29 PM)    empagliflozin (JARDIANCE) 25 MG TABS tablet [id number] Yes Take 1 tablet (25 mg total) by mouth daily before breakfast. Einar Pheasant, MD Taking Active   glucose blood (ONETOUCH VERIO) test strip [id number]  Use to check blood sugars up to three times daily. Einar Pheasant, MD  Active   hydrALAZINE (APRESOLINE) 10 MG tablet [id number] Yes TAKE 1 TABLET(10 MG) BY MOUTH THREE TIMES DAILY Einar Pheasant, MD Taking Active   losartan-hydrochlorothiazide [facility]) 100-25 MG tablet [id number] Yes TAKE 1 TABLET BY MOUTH DAILY Einar Pheasant, MD  Taking Active   omeprazole (PRILOSEC) 40 MG capsule [id number] Yes TAKE 1 CAPSULE(40 MG) BY MOUTH DAILY Einar Pheasant, MD Taking Active   OneTouch Delica Lancets 63K MISC [id number] Yes Use to check blood sugars up to 3 times daily. Einar Pheasant, MD Taking Active   rosuvastatin (CRESTOR) 40 MG tablet [id number] Yes Take 1 tablet (40 mg total) by mouth daily. Einar Pheasant, MD Taking Active   vitamin E 400 UNIT capsule [id number] Yes Take 400 Units by mouth daily. [provider] Taking Active Self            Pertinent Labs:   Lab Results  Component Value Date   HGBA1C 7.1 (H) 02/04/2021   Lab Results  Component Value Date   CHOL 142 02/04/2021   HDL 36.80 (L) 02/04/2021   LDLCALC 64 04/15/2017   LDLDIRECT 74.0 02/04/2021   TRIG 224.0 (H) 02/04/2021   CHOLHDL 4 02/04/2021   Lab Results  Component Value Date   CREATININE 1.24 (H) 02/04/2021   BUN 24 (H) 02/04/2021   NA 140 02/04/2021   K 4.1 02/04/2021   CL 104 02/04/2021   CO2 25 02/04/2021    SDOH:  (Social Determinants of Health) assessments and interventions performed:  SDOH Interventions    Flowsheet Row Most Recent Value  SDOH Interventions   Financial Strain Interventions Other (Comment)  [manufacturer assistance]       CCM Care Plan  Review of patient past medical history, allergies, medications, health status,  including review of consultants reports, laboratory and other test data, was performed as part of comprehensive evaluation and provision of chronic care management services.   Care Plan : Medication Management  Updates made by [name], RPH-CPP since 04/30/2021 12:00 AM     Problem: Diabetes, Hypertension, Hyperlipidemia      Long-Range Goal: Disease Progression Prevention   Recent Progress: On track  Priority: High  Note:   Current Barriers:  Unable to independently afford treatment regimen Unable to maintain control of diabetes, hyperlipidemia  Pharmacist Clinical  Goal(s):  Over the next 90 days, patient will verbalize ability to afford treatment regimen. Over the next 90 days, patient will achieve adherence to monitoring guidelines and medication adherence to achieve therapeutic efficacy. Over the next 90 days, patient will achieve control of diabetes and hyperlipidemia as evidenced by improvement in lab work through collaboration with PharmD and provider.   Interventions: 1:1 collaboration with Einar Pheasant, MD regarding development and update of comprehensive plan of care as evidenced by provider attestation and co-signature Inter-disciplinary care team collaboration (see longitudinal plan of care) Comprehensive medication review performed; medication list updated in electronic medical record  Health Maintenance   Yearly diabetic eye exam: up to date Yearly diabetic foot exam: up to date Urine microalbumin: up to date Yearly influenza vaccination: due - declines Td/Tdap vaccination: due - declines Pneumonia vaccination: up to date COVID vaccinations: due - declines Shingrix vaccinations: due - declines Colonoscopy: due - postponed Bone density scan: up to date Mammogram: up to date  Diabetes  Uncontrolled but improved current treatment: Jardiance 25 mg daily  Hx intolerance to metformin (including XR), loose stools Current glucose readings: has not been checking recently Most recent weight: 195 lbs - maintaining recently Refill required for Jardiance. Faxed to [employer] Cares/[facility]ord today  Hypertension: Controlled per last clinic reading and home readings; current treatment: hydralazine 10 mg TID, losartan/HCTZ 100/25 mg daily, carvedilol 12.5 mg BID Previously recommended to continue current regimen along with periodic BP monitoring  Hyperlipidemia, vascular disease, cartoid  Uncontrolled, goal <70 likely appropriate given PVD, carotid atherosclerosis; current treatment: rosuvastatin 40 mg daily Antiplatelet regimen: aspirin 325 mg  daily (recommended by vascular) Previously recommended addition of ezetimibe to target more stringent lipid control and ASCVD risk factor reduction. Patient declines to add medication at this time.    GERD: Controlled per patient report; current regimen: omeprazole 40 mg daily Previously recommended to continue current regimen at this time  Patient Goals/Self-Care Activities Over the next 90 days, patient will:  - take medications as prescribed check blood glucose daily to twice daily, document, and provide at future appointments check blood pressure periodically, document, and provide at future appointments collaborate with provider on medication access solutions      Plan: Telephone follow up appointment with care management team member scheduled for:  3 months as previously scheduled  Catie Darnelle Maffucci, PharmD, [place], [place] Pharmacist [employer] at [employer] [phone]

## 2021-04-30 NOTE — Patient Instructions (Signed)
Visit Information  Following are the goals we discussed today:  Patient Goals/Self-Care Activities Over the next 90 days, patient will:  - take medications as prescribed check blood glucose daily to twice daily, document, and provide at future appointments check blood pressure periodically, document, and provide at future appointments collaborate with provider on medication access solutions        Plan: Telephone follow up appointment with care management team member scheduled for:  3 months as previously scheduled   Catie Darnelle Maffucci, PharmD, [place], CPP Clinical Pharmacist [place] at [facility] [phone]   Please call the care guide team at [phone] if you need to cancel or reschedule your appointment.   Patient verbalizes understanding of instructions provided today and agrees to view in [place].

## 2021-05-09 ENCOUNTER — Ambulatory Visit (INDEPENDENT_AMBULATORY_CARE_PROVIDER_SITE_OTHER): Payer: Medicare Other | Admitting: Pharmacist

## 2021-05-09 ENCOUNTER — Telehealth: Payer: Self-pay | Admitting: Internal Medicine

## 2021-05-09 DIAGNOSIS — I10 Essential (primary) hypertension: Secondary | ICD-10-CM

## 2021-05-09 DIAGNOSIS — E1159 Type 2 diabetes mellitus with other circulatory complications: Secondary | ICD-10-CM

## 2021-05-09 DIAGNOSIS — I739 Peripheral vascular disease, unspecified: Secondary | ICD-10-CM

## 2021-05-09 MED ORDER — EMPAGLIFLOZIN 25 MG PO TABS: 25.0000 mg | ORAL_TABLET | Freq: Every day | ORAL | 3 refills | Status: DC

## 2021-05-09 NOTE — Chronic Care Management (AMB) (Signed)
Chronic Care Management CCM Pharmacy Note  05/09/2021 Name:  Lindsey Bentley MRN:  [id number] DOB:  1945/08/19  Summary: - Has not received final 2022 refill of Jardiance.  Recommendations/Changes made from today's visit: - Providing samples  Subjective: Lindsey Bentley is an 75 y.o. year old female who is a primary patient of Einar Pheasant, MD.  The CCM team was consulted for assistance with disease management and care coordination needs.    Engaged with patient by telephone for follow up visit for pharmacy case management and/or care coordination services.   Objective:  Medications Reviewed Today     Reviewed by [name], RPH-CPP (Pharmacist) on 03/31/21 at 45  Med List Status: <None>   Medication Order Taking? Sig Documenting Provider Last Dose Status Informant  acetaminophen (TYLENOL) 650 MG CR tablet [id number]  Take 650 mg by mouth as needed for pain. [provider]  Active   aspirin 325 MG tablet [id number] Yes Take 325 mg by mouth daily. [provider] Taking Active Self  carvedilol (COREG) 12.5 MG tablet [id number] Yes TAKE 1 TABLET(12.5 MG) BY MOUTH TWICE DAILY WITH MEALS Einar Pheasant, MD Taking Active   Cholecalciferol 25 MCG (1000 UT) tablet [id number] Yes Take 1,000 Units by mouth daily.  [provider] Taking Active Self           Med Note Meda Coffee, Rex Kras   Wed Oct 09, 2015  4:29 PM)    empagliflozin (JARDIANCE) 25 MG TABS tablet [id number] Yes Take 1 tablet (25 mg total) by mouth daily before breakfast. Einar Pheasant, MD Taking Active   glucose blood (ONETOUCH VERIO) test strip [id number]  Use to check blood sugars up to three times daily. Einar Pheasant, MD  Active   hydrALAZINE (APRESOLINE) 10 MG tablet [id number] Yes TAKE 1 TABLET(10 MG) BY MOUTH THREE TIMES DAILY Einar Pheasant, MD Taking Active   losartan-hydrochlorothiazide [facility]) 100-25 MG tablet [id number] Yes TAKE 1 TABLET BY MOUTH DAILY Einar Pheasant, MD Taking  Active   omeprazole (PRILOSEC) 40 MG capsule [id number] Yes TAKE 1 CAPSULE(40 MG) BY MOUTH DAILY Einar Pheasant, MD Taking Active   OneTouch Delica Lancets 22E MISC [id number] Yes Use to check blood sugars up to 3 times daily. Einar Pheasant, MD Taking Active   rosuvastatin (CRESTOR) 40 MG tablet [id number] Yes Take 1 tablet (40 mg total) by mouth daily. Einar Pheasant, MD Taking Active   vitamin E 400 UNIT capsule [id number] Yes Take 400 Units by mouth daily. [provider] Taking Active Self            Pertinent Labs:   Lab Results  Component Value Date   HGBA1C 7.1 (H) 02/04/2021   Lab Results  Component Value Date   CHOL 142 02/04/2021   HDL 36.80 (L) 02/04/2021   LDLCALC 64 04/15/2017   LDLDIRECT 74.0 02/04/2021   TRIG 224.0 (H) 02/04/2021   CHOLHDL 4 02/04/2021   Lab Results  Component Value Date   CREATININE 1.24 (H) 02/04/2021   BUN 24 (H) 02/04/2021   NA 140 02/04/2021   K 4.1 02/04/2021   CL 104 02/04/2021   CO2 25 02/04/2021    SDOH:  (Social Determinants of Health) assessments and interventions performed:    [place]  Review of patient past medical history, allergies, medications, health status, including review of consultants reports, laboratory and other test data, was performed as part of comprehensive evaluation and provision of chronic care management services.   Care Plan : Medication  Management  Updates made by [name], RPH-CPP since 05/09/2021 12:00 AM     Problem: Diabetes, Hypertension, Hyperlipidemia      Long-Range Goal: Disease Progression Prevention   Recent Progress: On track  Priority: High  Note:   Current Barriers:  Unable to independently afford treatment regimen Unable to maintain control of diabetes, hyperlipidemia  Pharmacist Clinical Goal(s):  Over the next 90 days, patient will verbalize ability to afford treatment regimen. Over the next 90 days, patient will achieve adherence to monitoring  guidelines and medication adherence to achieve therapeutic efficacy. Over the next 90 days, patient will achieve control of diabetes and hyperlipidemia as evidenced by improvement in lab work through collaboration with PharmD and provider.   Interventions: 1:1 collaboration with Einar Pheasant, MD regarding development and update of comprehensive plan of care as evidenced by provider attestation and co-signature Inter-disciplinary care team collaboration (see longitudinal plan of care) Comprehensive medication review performed; medication list updated in electronic medical record  Health Maintenance   Yearly diabetic eye exam: up to date Yearly diabetic foot exam: up to date Urine microalbumin: up to date Yearly influenza vaccination: due - declines Td/Tdap vaccination: due - declines Pneumonia vaccination: up to date COVID vaccinations: due - declines Shingrix vaccinations: due - declines Colonoscopy: due - postponed Bone density scan: up to date Mammogram: up to date  Diabetes  Uncontrolled but improved current treatment: Jardiance 25 mg daily  Hx intolerance to metformin (including XR), loose stools Reports she has not received a refill yet. The order was faxed 12/21. Will prepare a sample to tide patient over until order arrives  Contacted [facility]. If they received our fax from 12/21, they have not processed yet. Provided verbal prescription. However, they have completed all shipments for this calendar year as they inventory this afternoon. Will have to wait until 3845 application processes and ships.   Hypertension: Controlled per last clinic reading and home readings; current treatment: hydralazine 10 mg TID, losartan/HCTZ 100/25 mg daily, carvedilol 12.5 mg BID Previously recommended to continue current regimen along with periodic BP monitoring  Hyperlipidemia, vascular disease, cartoid  Uncontrolled, goal <70 likely appropriate given PVD, carotid  atherosclerosis; current treatment: rosuvastatin 40 mg daily Antiplatelet regimen: aspirin 325 mg daily (recommended by vascular) Previously recommended addition of ezetimibe to target more stringent lipid control and ASCVD risk factor reduction. Patient declines to add medication at this time.    GERD: Controlled per patient report; current regimen: omeprazole 40 mg daily Previously recommended to continue current regimen at this time  Patient Goals/Self-Care Activities Over the next 90 days, patient will:  - take medications as prescribed check blood glucose daily to twice daily, document, and provide at future appointments check blood pressure periodically, document, and provide at future appointments collaborate with provider on medication access solutions      Plan: Telephone follow up appointment with care management team member scheduled for:  3 months  Catie Darnelle Maffucci, PharmD, [place], [place] Pharmacist [employer] at [employer] [phone]

## 2021-05-09 NOTE — Patient Instructions (Signed)
Visit Information  Following are the goals we discussed today:  Patient Goals/Self-Care Activities Over the next 90 days, patient will:  - take medications as prescribed check blood glucose daily to twice daily, document, and provide at future appointments check blood pressure periodically, document, and provide at future appointments collaborate with provider on medication access solutions        Plan: Telephone follow up appointment with care management team member scheduled for:  3 months   Catie Darnelle Maffucci, PharmD, [place], CPP Clinical Pharmacist [place] at [facility] [phone]   Please call the care guide team at [phone] if you need to cancel or reschedule your appointment.   Patient verbalizes understanding of instructions provided today and agrees to view in [place].

## 2021-05-09 NOTE — Telephone Encounter (Signed)
Patient called in stated she would like to speak to Catie about  medication Jaridiance 25 MG . She would like a call back [phone]

## 2021-05-09 NOTE — Telephone Encounter (Signed)
**Note Lindsey-Identified via Obfuscation** Medication Samples have been labeled and logged for the patient.  Drug name: Jardiance       Strength: 25 mg        Qty: 2 boxes  LOT: [id number]  Exp.Date: 11/2022  Dosing instructions: Take 1 tablet by mouth daily  The patient has been instructed regarding the correct time, dose, and frequency of taking this medication, including desired effects and most common side effects.   Lindsey Bentley 12:53 PM 05/09/2021

## 2021-05-10 DIAGNOSIS — I10 Essential (primary) hypertension: Secondary | ICD-10-CM | POA: Diagnosis not present

## 2021-05-10 DIAGNOSIS — E1159 Type 2 diabetes mellitus with other circulatory complications: Secondary | ICD-10-CM | POA: Diagnosis not present

## 2021-05-10 DIAGNOSIS — I25119 Atherosclerotic heart disease of native coronary artery with unspecified angina pectoris: Secondary | ICD-10-CM | POA: Diagnosis not present

## 2021-05-14 ENCOUNTER — Telehealth: Payer: Self-pay | Admitting: Internal Medicine

## 2021-05-15 NOTE — Telephone Encounter (Signed)
Patient confirmed doing ok. No chest pain or sob. Patient has f/u appt on 1/9. Does not need anything at this time.

## 2021-05-15 NOTE — Telephone Encounter (Signed)
Provided information that she was approved for assistance. Provided phone number for her to call and try to expedite Jardiance shipment. If not, discussed that if her son is out of quarantine, he can come pick up her smaple

## 2021-05-15 NOTE — Telephone Encounter (Signed)
Please call and confirm Ms Culmer is doing ok, not only with covid, but also stress. Note states husband is in the hospital.  Per note, no chest pain or sob reported.  Also per note, did not feel needed to be seen.  Needs to keep [place] posted and let us know if needs anything

## 2021-05-15 NOTE — Telephone Encounter (Signed)
Pt called in stating that she has covid and wants medication mailed to her. PT also said she has not heard anything from the renewal

## 2021-05-15 NOTE — Telephone Encounter (Signed)
See prior phone encounter. Patient called me to ask about Jardiance, order, however:   Reports she tested positive for COVID on 12/29. Son passed it along to her and husband. Husband is currently admitted at [facility]. Patient did not seek oral antivirals, has been using OTC decongestants. Collaborated with CMA to triage. Denies fever anymore, denies SOB, denies chest tightness. Monitoring O2 sat, reports remaining >95%. Just endorses fatigue at this point. Encouraged to continue to hydrate, utilize OTC decongestants and notify our office if worsening of symptoms.   Routing to PCP for FYI.

## 2021-05-19 ENCOUNTER — Ambulatory Visit (INDEPENDENT_AMBULATORY_CARE_PROVIDER_SITE_OTHER): Payer: Medicare Other | Admitting: Internal Medicine

## 2021-05-19 DIAGNOSIS — E1159 Type 2 diabetes mellitus with other circulatory complications: Secondary | ICD-10-CM

## 2021-05-19 DIAGNOSIS — F439 Reaction to severe stress, unspecified: Secondary | ICD-10-CM

## 2021-05-19 DIAGNOSIS — E041 Nontoxic single thyroid nodule: Secondary | ICD-10-CM | POA: Diagnosis not present

## 2021-05-19 DIAGNOSIS — I25119 Atherosclerotic heart disease of native coronary artery with unspecified angina pectoris: Secondary | ICD-10-CM | POA: Diagnosis not present

## 2021-05-19 DIAGNOSIS — R002 Palpitations: Secondary | ICD-10-CM | POA: Diagnosis not present

## 2021-05-19 DIAGNOSIS — K219 Gastro-esophageal reflux disease without esophagitis: Secondary | ICD-10-CM

## 2021-05-19 DIAGNOSIS — I10 Essential (primary) hypertension: Secondary | ICD-10-CM | POA: Diagnosis not present

## 2021-05-19 DIAGNOSIS — I779 Disorder of arteries and arterioles, unspecified: Secondary | ICD-10-CM | POA: Diagnosis not present

## 2021-05-19 DIAGNOSIS — I739 Peripheral vascular disease, unspecified: Secondary | ICD-10-CM | POA: Diagnosis not present

## 2021-05-19 DIAGNOSIS — Z86010 Personal history of colonic polyps: Secondary | ICD-10-CM

## 2021-05-19 DIAGNOSIS — J449 Chronic obstructive pulmonary disease, unspecified: Secondary | ICD-10-CM

## 2021-05-19 DIAGNOSIS — E7800 Pure hypercholesterolemia, unspecified: Secondary | ICD-10-CM | POA: Diagnosis not present

## 2021-05-19 DIAGNOSIS — N1831 Chronic kidney disease, stage 3a: Secondary | ICD-10-CM

## 2021-05-19 DIAGNOSIS — Z853 Personal history of malignant neoplasm of breast: Secondary | ICD-10-CM

## 2021-05-19 DIAGNOSIS — U071 COVID-19: Secondary | ICD-10-CM

## 2021-05-19 NOTE — Progress Notes (Signed)
[name], female   DOB: 07-20-1945, 76 y.o.   MRN: [id number]   Virtual Visit via video Note  This visit type was conducted due to national recommendations for restrictions regarding the COVID-19 pandemic (e.g. social distancing).  This format is felt to be most appropriate for this patient at this time.  All issues noted in this document were discussed and addressed.  No physical exam was performed (except for noted visual exam findings with Video Visits).   I connected with Lindsey Bentley by a video enabled telemedicine application and verified that I am speaking with the correct person using two identifiers. Location patient: home Location provider: work  Persons participating in the virtual visit: patient, provider  The limitations, risks, security and privacy concerns of performing an evaluation and management service by video and the availability of in person appointments have been discussed.  It has also been discussed with the patient that there may be a patient responsible charge related to this service. The patient expressed understanding and agreed to proceed.   Reason for visit: follow up appt  HPI: Follow up regarding her cholesterol and blood pressure as well as increased stress.  Tested positive for covid 05/08/21.  Fever - 102,  cough and chills.  Has been taking cough medication and tylenol.  Drinking fluids.  She is feeling better.  Pulse ox 96%.  Pulse 58.  Has minimal cough left over, but does feel much better.  No chest pain or sob reported.  No abdominal pain or bowel change reported.  Has decreased intake of sweets.  Blood sugar 107.  Weight - 186lbs.  Evaluated by Dr Gwen Pounds - 03/2021 - history of palpitations.  Holter - occasional PVCs, PACs, SVT, sinus tachycardia and asymptomatic bradycardia.  Stress test - normal.  No changes made.  Recommended f/u in one year.  She feels from cardiac standpoint - stable.  Increased stress.  Husband in hospital.  Also  heating system messed up.  Discussed.  Overall she feels she feels she is handling things relatively well.  Does not feel needs any further intervention.     ROS: See pertinent positives and negatives per HPI.  Past Medical History:  Diagnosis Date   Arthritis    Breast cancer (HCC)    Breast cancer, left (HCC) 2016   LT LUMPECTOMY - TI, NO, MO - IDC, ER/PR pos, Her 2 neg, Rad tx's.    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   CHF (congestive heart failure) (HCC)    COPD (chronic obstructive pulmonary disease) (HCC)    Coronary artery disease    Depression    Diffuse cystic mastopathy 2013   DVT (deep venous thrombosis) (HCC)    Dyspnea    Elevated TSH    Family history of adverse reaction to anesthesia    Pt stated that son had a seizure with a combination of anesthesia and pain medication."   Fatty liver    GERD (gastroesophageal reflux disease)    Glaucoma    High cholesterol    History of chicken pox    Hyperglycemia    Hyperlipidemia    Hypertension    LVH (left ventricular hypertrophy)    PAC (premature atrial contraction)    Palpitations    Peripheral vascular disease (HCC)    Personal history of radiation therapy 2016   BREAST CA   Pneumonia March 2014  Skin cancer 2013   Sleep apnea    wears CPAP set at 2.5   Wears glasses     Past Surgical History:  Procedure Laterality Date   BACK SURGERY  1986   ruptured disc   BREAST BIOPSY Left 2008   NEG   BREAST BIOPSY Left 08-20-14   POS   BREAST BIOPSY Right 2007   NEG   BREAST EXCISIONAL BIOPSY Left 1984   NEG   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 HER2 negative.  Wide excision with SLN biopsy.  MammoSite radiation   CARPAL TUNNEL RELEASE     CHOLECYSTECTOMY  2006   COLONOSCOPY  2010   Dr. Jamal Collin   COLONOSCOPY WITH PROPOFOL N/A 05/17/2017   Procedure:  COLONOSCOPY WITH PROPOFOL;  Surgeon: Manya Silvas, MD;  Location[facility] END[facility]COPY;  Service: Endoscopy;  Laterality: N/A;   DILATION AND CURETTAGE OF UTERUS     ESOPHAGOGASTRODUODENOSCOPY (EGD) WITH PROPOFOL N/A 05/17/2017   Procedure: ESOPHAGOGASTRODUODENOSCOPY (EGD) WITH PROPOFOL;  Surgeon: Manya Silvas, MD;  Location: [facility] ENDOSCOPY;  Service: Endoscopy;  Laterality: N/A;   LAPAROTOMY FOR REMOVAL TUMOR LUMBAR PLEXES     Leg stent  2011   MOLE REMOVAL  2013   15 removed   POSTERIOR CERVICAL LAMINECTOMY Left 10/15/2015   Procedure: Left Cervical four- five Hemilaminectomy/Remove mass;  Surgeon: Leeroy Cha, MD;  Location: [facility]URO ORS;  Service: Neurosurgery;  Laterality: Left;  Left C4-5 Hemilaminectomy/Remove mass    Family History  Problem Relation Age of Onset   Cancer Father        Prostate   Diabetes Father    Cerebrovascular Accident Father    Hypertension Mother    AAA (abdominal aortic aneurysm) Mother    Hyperlipidemia Other        Parent   Miscarriages / Stillbirths Other        Parent   Hypertension Other        parent   Heart disease Other        Parent   Breast cancer Maternal Aunt 68    SOCIAL HX: reviewed.    Current Outpatient Medications:    acetaminophen (TYLENOL) 650 MG CR tablet, Take 650 mg by mouth as needed for pain., Disp: , Rfl:    aspirin 325 MG tablet, Take 325 mg by mouth daily., Disp: , Rfl:    carvedilol (COREG) 12.5 MG tablet, TAKE 1 TABLET(12.5 MG) BY MOUTH TWICE DAILY WITH MEALS, Disp: 180 tablet, Rfl: 1   Cholecalciferol 25 MCG (1000 UT) tablet, Take 1,000 Units by mouth daily. , Disp: , Rfl:    empagliflozin (JARDIANCE) 25 MG TABS tablet, Take 1 tablet (25 mg total) by mouth daily before breakfast., Disp: 90 tablet, Rfl: 3   glucose blood (ONETOUCH VERIO) test strip, Use to check blood sugars up to three times daily., Disp: 300 each, Rfl: 3   hydrALAZINE (APRESOLINE) 10 MG tablet, TAKE 1 TABLET(10 MG) BY MOUTH THREE TIMES DAILY,  Disp: 270 tablet, Rfl: 1   losartan-hydrochlorothiazide (HYZAAR) 100-25 MG tablet, TAKE 1 TABLET BY MOUTH DAILY, Disp: 90 tablet, Rfl: 1   omeprazole (PRILOSEC) 40 MG capsule, TAKE 1 CAPSULE(40 MG) BY MOUTH DAILY, Disp: 90 capsule, Rfl: 0   OneTouch Delica Lancets 65L MISC, Use to check blood sugars up to 3 times daily., Disp: 300 each, Rfl: 3   rosuvastatin (CRESTOR) 40 MG tablet, TAKE 1 TABLET(40 MG)  BY MOUTH DAILY, Disp: 90 tablet, Rfl: 3   vitamin E 400 UNIT capsule, Take 400 Units by mouth daily., Disp: , Rfl:   EXAM:  GENERAL: alert, oriented, appears well and in no acute distress  HEENT: atraumatic, conjunttiva clear, no obvious abnormalities on inspection of external nose and ears  NECK: normal movements of the head and neck  LUNGS: on inspection no signs of respiratory distress, breathing rate appears normal, no obvious gross SOB, gasping or wheezing  CV: no obvious cyanosis  PSYCH/NEURO: pleasant and cooperative, no obvious depression or anxiety, speech and thought processing grossly intact  ASSESSMENT AND PLAN:  Discussed the following assessment and plan:  Problem List Items Addressed This Visit     CAD (coronary artery disease)    Followed by cardiology.  No pain.  Continue risk factor modification.  Continue crestor.        Carotid artery disease (HCC)    Evaluated by Dr Wyn Quaker 04/2018 - 1-39% carotid ultrasound.  Recommended f/u in 2 years.  Overdue.        CKD (chronic kidney disease), stage III (HCC)    Avoid anti-inflammatories..  Stay hydrated. Follow met b.       COPD (chronic obstructive pulmonary disease) (HCC)    Breathing overall stable.  Recent covid infection.  No sob.  Minimal cough.  Improved.  Follow.  Notify me if does not completely resolve.       COVID-19 virus infection    Recent infection as outlined.  Doing better.  Symptoms started 05/08/22.  Robitussin DM or delsym as directed.  No chest pain or sob.  Improved.  Follow.  Call with update.         Diabetes mellitus with cardiac complication (HCC)    On jardiance.  Follow met b and a1c.  Low carb diet and exercise.        Relevant Orders   Hemoglobin A1c   Basic metabolic panel   GERD (gastroesophageal reflux disease)    No upper symptom reported.  On prilosec.       History of breast cancer    Completed femara.  Mammogram 12/2020 - Birads I.       History of colon polyps    Last colonoscopy January 2019 (Dr. Mechele Collin).  Per patient recommended follow-up in 3 years.  Overdue follow-up. Will need to get current infection clear prior to f/u colonoscopy.       Hypercholesterolemia    On Crestor.  Low-cholesterol diet and exercise.  Follow-up lipid panel liver function test.      Relevant Orders   Lipid panel   Hepatic function panel   Hypertension    Continue losartan/hctz, coreg and hydralazine.  Follow pressures.  Follow metabolic panel.       Palpitations    Cardiac w/up as outlined.  Saw cardiology 03/2021.  No further w/up warranted at this time.  Follow.       Peripheral vascular disease (HCC)    Followed by a [facility].  Continue aspirin and Crestor. evaluated in 04/2018.  Per note, recommended f/u in 2 years. Overdue f/u.        Stress    Increased stress.  Discussed.  Will notify me if feels needs any further intervention.  Follow.       Thyroid nodule    Evaluated by Dr Tedd Sias.  Biopsy negative.  Follow tsh.        Relevant Orders   TSH   T4, free  Return in about 2 months (around 07/17/2021) for physical.   I discussed the assessment and treatment plan with the patient. The patient was provided an opportunity to ask questions and all were answered. The patient agreed with the plan and demonstrated an understanding of the instructions.   The patient was advised to call back or seek an in-person evaluation if the symptoms worsen or if the condition fails to improve as anticipated.   Einar Pheasant, MD

## 2021-05-21 ENCOUNTER — Other Ambulatory Visit: Payer: Self-pay | Admitting: Internal Medicine

## 2021-05-24 ENCOUNTER — Telehealth: Payer: Self-pay | Admitting: Internal Medicine

## 2021-05-24 ENCOUNTER — Encounter: Payer: Self-pay | Admitting: Internal Medicine

## 2021-05-24 DIAGNOSIS — U071 COVID-19: Secondary | ICD-10-CM | POA: Insufficient documentation

## 2021-05-24 NOTE — Assessment & Plan Note (Signed)
On Crestor.  Low-cholesterol diet and exercise.  Follow-up lipid panel liver function test.

## 2021-05-24 NOTE — Assessment & Plan Note (Signed)
Increased stress.  Discussed.  Will notify me if feels needs any further intervention.  Follow  

## 2021-05-24 NOTE — Assessment & Plan Note (Signed)
Avoid anti-inflammatories..  Stay hydrated. Follow met b.  °

## 2021-05-24 NOTE — Assessment & Plan Note (Signed)
Cardiac w/up as outlined.  Saw cardiology 03/2021.  No further w/up warranted at this time.  Follow.

## 2021-05-24 NOTE — Assessment & Plan Note (Signed)
Continue losartan/hctz, coreg and hydralazine.  Follow pressures.  Follow metabolic panel. 

## 2021-05-24 NOTE — Assessment & Plan Note (Signed)
Evaluated by Dr Lucky Cowboy 04/2018 - 1-39% carotid ultrasound.  Recommended f/u in 2 years.  Overdue.

## 2021-05-24 NOTE — Assessment & Plan Note (Signed)
Evaluated by Dr Solum.  Biopsy negative.  Follow tsh.   

## 2021-05-24 NOTE — Assessment & Plan Note (Signed)
Breathing overall stable.  Recent covid infection.  No sob.  Minimal cough.  Improved.  Follow.  Notify me if does not completely resolve.

## 2021-05-24 NOTE — Assessment & Plan Note (Signed)
On jardiance.  Follow met b and a1c.  Low carb diet and exercise.

## 2021-05-24 NOTE — Telephone Encounter (Signed)
Needs a physical scheduled in 2 months and fasting labs 1-2 days before physical. (Labs have ben ordered).

## 2021-05-24 NOTE — Assessment & Plan Note (Signed)
Followed by cardiology.  No pain.  Continue risk factor modification.  Continue crestor.

## 2021-05-24 NOTE — Assessment & Plan Note (Signed)
Completed femara.  Mammogram 12/2020 - Birads I.

## 2021-05-24 NOTE — Assessment & Plan Note (Signed)
No upper symptom reported.  On prilosec.  

## 2021-05-24 NOTE — Assessment & Plan Note (Signed)
Recent infection as outlined.  Doing better.  Symptoms started 05/08/22.  Robitussin DM or delsym as directed.  No chest pain or sob.  Improved.  Follow.  Call with update.

## 2021-05-24 NOTE — Assessment & Plan Note (Addendum)
Followed by a [facility].  Continue aspirin and Crestor. evaluated in 04/2018.  Per note, recommended f/u in 2 years. Overdue f/u.

## 2021-05-24 NOTE — Assessment & Plan Note (Signed)
Last colonoscopy January 2019 (Dr. Vira Agar).  Per patient recommended follow-up in 3 years.  Overdue follow-up. Will need to get current infection clear prior to f/u colonoscopy.

## 2021-05-26 NOTE — Telephone Encounter (Signed)
Spoke with pt and scheduled both physical and fasting lab appt.

## 2021-05-28 ENCOUNTER — Telehealth: Payer: Self-pay | Admitting: Pharmacy Technician

## 2021-05-28 DIAGNOSIS — Z596 Low income: Secondary | ICD-10-CM

## 2021-05-28 NOTE — Progress Notes (Signed)
[place] [facility])                                            [place] Team    05/28/2021  Lindsey Bentley [birth year] [id number]  Care coordination call placed to [facility] in regard to [name] application.  Spoke to Daphne who informs patient is APPROVED 05/11/21-05/10/22. Patient will need to call [facility] for refills when needed approximately 14 days before running out of medication and the medication will be delivered to the patient's home.  [name] P. Brooks Stotz, [place]  [phone]

## 2021-07-08 DIAGNOSIS — G4733 Obstructive sleep apnea (adult) (pediatric): Secondary | ICD-10-CM | POA: Diagnosis not present

## 2021-07-14 ENCOUNTER — Ambulatory Visit: Payer: Medicare Other | Admitting: Pharmacist

## 2021-07-14 DIAGNOSIS — E1159 Type 2 diabetes mellitus with other circulatory complications: Secondary | ICD-10-CM

## 2021-07-14 DIAGNOSIS — N1831 Chronic kidney disease, stage 3a: Secondary | ICD-10-CM

## 2021-07-14 DIAGNOSIS — I739 Peripheral vascular disease, unspecified: Secondary | ICD-10-CM

## 2021-07-14 NOTE — Patient Instructions (Addendum)
Lindsey Bentley,  ? ?Keep up the great work! ? ?For prescription refills through the [facility] Patient Assistance Program, call 1-800-556-8317. Once you have been enrolled in the program, your prescriptions can easily be refilled by contacting the phone number above Monday though Friday 8:30 AM - 6:00 PM.  ? ?We recommend the Shingrix (shingles) vaccine series for all over age 77.  We also recommend the Tdap (tetanus, diptheria, pertussis) vaccine every 10 years. Both should have a $0 copay on all Medicare plans this year. You can pursue this without a prescription at your local pharmacy, or feel free to call our [facility] at [phone]. ? ?Take care! ? ?Catie Travis, PharmD ? ?. ?  ?Patient Goals/Self-Care Activities ?Over the next 90 days, patient will:  ?- take medications as prescribed ?check blood glucose daily to twice daily, document, and provide at future appointments ?check blood pressure periodically, document, and provide at future appointments ?collaborate with provider on medication access solutions ?   ?  ?  ?Plan: Goals of care met. Closing CCM case at this time ?  ?Catie Travis, PharmD, BCACP, CPP ?Clinical Pharmacist ?[facility] [place] ?[phone] ?  ? ?

## 2021-07-14 NOTE — Chronic Care Management (AMB) (Signed)
? ?Chronic Care Management ?CCM Pharmacy Note ? ?07/14/2021 ?Name:  Lindsey Bentley MRN:  [id number] DOB:  [birth year] ? ?Summary: ?- Tolerating regimen well. Upcoming labs and PCP appointment ? ?Recommendations/Changes made from today's visit: ?- Continue current regimen at this time ?- Discussed vaccine recommendations ? ?Subjective: ?Lindsey Bentley is an 76 y.o. year old female who is a primary patient of Einar Pheasant, MD.  The CCM team was consulted for assistance with disease management and care coordination needs.   ? ?Engaged with patient by telephone for follow up visit for pharmacy case management and/or care coordination services.  ? ?Objective: ? ?Medications Reviewed Today   ? ? Reviewed by [name], RPH-CPP (Pharmacist) on 07/14/21 at 1043  Med List Status: <None>  ? ?Medication Order Taking? Sig Documenting Provider Last Dose Status Informant  ?acetaminophen (TYLENOL) 650 MG CR tablet [id number] Yes Take 650 mg by mouth as needed for pain. [provider] Taking Active   ?aspirin 325 MG tablet [id number] Yes Take 325 mg by mouth daily. [provider] Taking Active Self  ?carvedilol (COREG) 12.5 MG tablet [id number] Yes TAKE 1 TABLET(12.5 MG) BY MOUTH TWICE DAILY WITH MEALS Einar Pheasant, MD Taking Active   ?Cholecalciferol 25 MCG (1000 UT) tablet [id number] Yes Take 1,000 Units by mouth daily.  [provider] Taking Active Self  ?         ?Med Note Shawna Orleans Oct 09, 2015  4:29 PM)    ?empagliflozin (JARDIANCE) 25 MG TABS tablet [id number] Yes Take 1 tablet (25 mg total) by mouth daily before breakfast. Einar Pheasant, MD Taking Active   ?glucose blood (ONETOUCH VERIO) test strip [id number] Yes Use to check blood sugars up to three times daily. Einar Pheasant, MD Taking Active   ?hydrALAZINE (APRESOLINE) 10 MG tablet [id number] Yes TAKE 1 TABLET(10 MG) BY MOUTH THREE TIMES DAILY Einar Pheasant, MD Taking Active   ?losartan-hydrochlorothiazide (HYZAAR)  100-25 MG tablet [id number] Yes TAKE 1 TABLET BY MOUTH DAILY Einar Pheasant, MD Taking Active   ?omeprazole (PRILOSEC) 40 MG capsule [id number] Yes TAKE 1 CAPSULE(40 MG) BY MOUTH DAILY Einar Pheasant, MD Taking Active   ?OneTouch Delica Lancets 77A MISC [id number] Yes Use to check blood sugars up to 3 times daily. Einar Pheasant, MD Taking Active   ?rosuvastatin (CRESTOR) 40 MG tablet [id number] Yes TAKE 1 TABLET(40 MG) BY MOUTH DAILY Einar Pheasant, MD Taking Active   ?vitamin E 400 UNIT capsule [id number] Yes Take 400 Units by mouth daily. [provider] Taking Active Self  ? ?  ?  ? ?  ? ? ?Pertinent Labs:   ?Lab Results  ?Component Value Date  ? HGBA1C 7.1 (H) 02/04/2021  ? ?Lab Results  ?Component Value Date  ? CHOL 142 02/04/2021  ? HDL 36.80 (L) 02/04/2021  ? Wilson 64 04/15/2017  ? LDLDIRECT 74.0 02/04/2021  ? TRIG 224.0 (H) 02/04/2021  ? CHOLHDL 4 02/04/2021  ? ?Lab Results  ?Component Value Date  ? CREATININE 1.24 (H) 02/04/2021  ? BUN 24 (H) 02/04/2021  ? NA 140 02/04/2021  ? K 4.1 02/04/2021  ? CL 104 02/04/2021  ? CO2 25 02/04/2021  ? ? ?SDOH:  (Social Determinants of Health) assessments and interventions performed:  ?SDOH Interventions   ? ?Flowsheet Row Most Recent Value  ?SDOH Interventions   ?Financial Strain Interventions Other (Comment)  [manufacturer assistanced]  ? ?  ? ? ?[place] ? ?Review of patient past medical history, allergies,  medications, health status, including review of consultants reports, laboratory and other test data, was performed as part of comprehensive evaluation and provision of chronic care management services.  ? ?Care Plan : Medication Management  ?Updates made by [name], RPH-CPP since 07/14/2021 12:00 AM  ?Completed 07/14/2021  ? ?Problem: Diabetes, Hypertension, Hyperlipidemia Resolved 07/14/2021  ?  ? ?Long-Range Goal: Disease Progression Prevention Completed 07/14/2021  ?Recent Progress: On track  ?Priority: High  ?Note:   ?Current Barriers:   ?Unable to independently afford treatment regimen ?Unable to maintain control of diabetes, hyperlipidemia ? ?Pharmacist Clinical Goal(s):  ?Over the next 90 days, patient will verbalize ability to afford treatment regimen. ?Over the next 90 days, patient will achieve adherence to monitoring guidelines and medication adherence to achieve therapeutic efficacy. ?Over the next 90 days, patient will achieve control of diabetes and hyperlipidemia as evidenced by improvement in lab work through collaboration with PharmD and provider.  ? ?Interventions: ?1:1 collaboration with Einar Pheasant, MD regarding development and update of comprehensive plan of care as evidenced by provider attestation and co-signature ?Inter-disciplinary care team collaboration (see longitudinal plan of care) ?Comprehensive medication review performed; medication list updated in electronic medical record ? ?Health Maintenance   ?Yearly diabetic eye exam: up to date ?Yearly diabetic foot exam: up to date ?Urine microalbumin: up to date ?Yearly influenza vaccination: due - declines ?Td/Tdap vaccination: due - encouraged today ?Pneumonia vaccination: up to date ?COVID vaccinations: due - declines ?Shingrix vaccinations: due -encouraged today ?Colonoscopy: due - postponed ?Bone density scan: up to date ?Mammogram: up to date ? ?Diabetes  ?Uncontrolled but improved current treatment: Jardiance 25 mg daily  ?Hx intolerance to metformin (including XR), loose stools ?Approved for Jardiance assistance through [facility] through 2023.  ?Home BG readings: 100-120s ?Recommended to continue current regimen at this time. Reviewed Jardiance refill procedure ? ?Hypertension: ?Controlled per last clinic reading and home readings; current treatment: hydralazine 10 mg TID, losartan/HCTZ 100/25 mg daily, carvedilol 12.5 mg BID ?Reports self care and mediation strategies have helped with stress ?Home BP readings: 120-130s/60-70s. Denies lightheadedness, dizziness.   ?Recommended to continue current regimen along with periodic BP monitoring ? ?Hyperlipidemia, vascular disease, cartoid  ?Uncontrolled, goal <70 likely appropriate given PVD, carotid atherosclerosis; current treatment: rosuvastatin 40 mg daily ?Antiplatelet regimen: aspirin 325 mg daily (recommended by vascular) ?If next LDL >70, recommend consideration for addition of ezetimibe to target more stringent lipid control and ASCVD risk factor reduction.  ? ?GERD: ?Controlled per patient report; current regimen: omeprazole 40 mg daily ?Previously recommended to continue current regimen at this time ? ?Patient Goals/Self-Care Activities ?Over the next 90 days, patient will:  ?- take medications as prescribed ?check blood glucose daily to twice daily, document, and provide at future appointments ?check blood pressure periodically, document, and provide at future appointments ?collaborate with provider on medication access solutions ?  ?  ? ?Plan: Goals of care met. Closing CCM case at this time ? ?Catie Darnelle Maffucci, PharmD, BCACP, CPP ?Clinical Pharmacist ?Therapist, music at [employer] ?[phone] ? ? ? ? ? ?

## 2021-07-25 ENCOUNTER — Other Ambulatory Visit (INDEPENDENT_AMBULATORY_CARE_PROVIDER_SITE_OTHER): Payer: Medicare Other

## 2021-07-25 ENCOUNTER — Other Ambulatory Visit: Payer: Self-pay

## 2021-07-25 DIAGNOSIS — E041 Nontoxic single thyroid nodule: Secondary | ICD-10-CM | POA: Diagnosis not present

## 2021-07-25 DIAGNOSIS — E1159 Type 2 diabetes mellitus with other circulatory complications: Secondary | ICD-10-CM | POA: Diagnosis not present

## 2021-07-25 DIAGNOSIS — E7800 Pure hypercholesterolemia, unspecified: Secondary | ICD-10-CM | POA: Diagnosis not present

## 2021-07-25 LAB — LIPID PANEL
Cholesterol: 138 mg/dL (ref 0–200)
HDL: 40.1 mg/dL (ref >39.00)
NonHDL: 97.57
Total CHOL/HDL Ratio: 3
Triglycerides: 261 mg/dL — ABNORMAL HIGH (ref 0.0–149.0)
VLDL: 52.2 mg/dL — ABNORMAL HIGH (ref 0.0–40.0)

## 2021-07-25 LAB — HEPATIC FUNCTION PANEL
ALT: 14 U/L (ref 0–35)
AST: 14 U/L (ref 0–37)
Albumin: 4.4 g/dL (ref 3.5–5.2)
Alkaline Phosphatase: 45 U/L (ref 39–117)
Bilirubin, Direct: 0.1 mg/dL (ref 0.0–0.3)
Total Bilirubin: 0.8 mg/dL (ref 0.2–1.2)
Total Protein: 7 g/dL (ref 6.0–8.3)

## 2021-07-25 LAB — TSH: TSH: 4.44 u[IU]/mL (ref 0.35–5.50)

## 2021-07-25 LAB — T4, FREE: Free T4: 0.73 ng/dL (ref 0.60–1.60)

## 2021-07-25 LAB — BASIC METABOLIC PANEL
BUN: 23 mg/dL (ref 6–23)
CO2: 27 mEq/L (ref 19–32)
Calcium: 9.5 mg/dL (ref 8.4–10.5)
Chloride: 100 mEq/L (ref 96–112)
Creatinine, Ser: 1.15 mg/dL (ref 0.40–1.20)
GFR: 46.13 mL/min — ABNORMAL LOW (ref >60.00)
Glucose, Bld: 109 mg/dL — ABNORMAL HIGH (ref 70–99)
Potassium: 3.9 mEq/L (ref 3.5–5.1)
Sodium: 136 mEq/L (ref 135–145)

## 2021-07-25 LAB — LDL CHOLESTEROL, DIRECT: Direct LDL: 70 mg/dL

## 2021-07-25 LAB — HEMOGLOBIN A1C: Hgb A1c MFr Bld: 7 % — ABNORMAL HIGH (ref 4.6–6.5)

## 2021-07-28 ENCOUNTER — Other Ambulatory Visit: Payer: Self-pay

## 2021-07-28 ENCOUNTER — Ambulatory Visit (INDEPENDENT_AMBULATORY_CARE_PROVIDER_SITE_OTHER): Payer: Medicare Other | Admitting: Internal Medicine

## 2021-07-28 VITALS — BP 122/76 | HR 60 | Temp 97.9°F | Resp 16 | Ht 65.0 in | Wt 190.0 lb

## 2021-07-28 DIAGNOSIS — E041 Nontoxic single thyroid nodule: Secondary | ICD-10-CM

## 2021-07-28 DIAGNOSIS — R7989 Other specified abnormal findings of blood chemistry: Secondary | ICD-10-CM

## 2021-07-28 DIAGNOSIS — I739 Peripheral vascular disease, unspecified: Secondary | ICD-10-CM | POA: Diagnosis not present

## 2021-07-28 DIAGNOSIS — J449 Chronic obstructive pulmonary disease, unspecified: Secondary | ICD-10-CM

## 2021-07-28 DIAGNOSIS — K219 Gastro-esophageal reflux disease without esophagitis: Secondary | ICD-10-CM

## 2021-07-28 DIAGNOSIS — Z853 Personal history of malignant neoplasm of breast: Secondary | ICD-10-CM

## 2021-07-28 DIAGNOSIS — Z0000 Encounter for general adult medical examination without abnormal findings: Secondary | ICD-10-CM

## 2021-07-28 DIAGNOSIS — I10 Essential (primary) hypertension: Secondary | ICD-10-CM

## 2021-07-28 DIAGNOSIS — E1159 Type 2 diabetes mellitus with other circulatory complications: Secondary | ICD-10-CM | POA: Diagnosis not present

## 2021-07-28 DIAGNOSIS — I779 Disorder of arteries and arterioles, unspecified: Secondary | ICD-10-CM

## 2021-07-28 DIAGNOSIS — F439 Reaction to severe stress, unspecified: Secondary | ICD-10-CM

## 2021-07-28 DIAGNOSIS — N1831 Chronic kidney disease, stage 3a: Secondary | ICD-10-CM | POA: Diagnosis not present

## 2021-07-28 DIAGNOSIS — E7800 Pure hypercholesterolemia, unspecified: Secondary | ICD-10-CM

## 2021-07-28 DIAGNOSIS — I25119 Atherosclerotic heart disease of native coronary artery with unspecified angina pectoris: Secondary | ICD-10-CM

## 2021-07-28 NOTE — Assessment & Plan Note (Addendum)
Physical today 07/28/21.  Colonoscopy 05/2017 - recommended f/u in 3 years.  Overdue. Will notify me when agreeable.  Mammogram 12/18/20 - Birads I.  ?

## 2021-07-28 NOTE — Progress Notes (Signed)
[name], female   DOB: Jul 11, 1945, 76 y.o.   MRN: [id number] ? ? ?Subjective:  ? ? [name], female    DOB: [birth year], 76 y.o.   MRN: [id number] ? ?This visit occurred during the SARS-CoV-2 public health emergency.  Safety protocols were in place, including screening questions prior to the visit, additional usage of staff PPE, and extensive cleaning of exam room while observing appropriate contact time as indicated for disinfecting solutions.  ? ?Patient here for physical exam.  ? ? ?HPI ?Reports she is doing relatively well.  Increased stress.  Discussed.  Has good support.  No chest pain.  Saw Dr Nehemiah Massed.  The holter monitor shows occasional PVCs, PACs, supraventricular tachycardia, sinus tachycardia and asymptomatic bradycardia. Stress Test - Persantine SESTAMIBI was performed showing Normal test. Echocardiogram showing Normal LV LVEF > 55% Mild MR Mild TR without pulmonary hypertension.  Breathing stable.  No acid reflux.  Had covid 05/08/21.  No residual problems.  No abdominal pain.  Bowels moving.  Blood pressure doing better.   ? ? ?Past Medical History:  ?Diagnosis Date  ? Arthritis   ? Breast cancer ([place])   ? Breast cancer, left ([place]) 2016  ? LT LUMPECTOMY - TI, NO, MO - IDC, ER/PR pos, Her 2 neg, Rad tx's.   ? Carotid artery occlusion   ? Carotid artery occlusion   ? Cervical mass   ? with cervicothoracic region disc displacement  ? CHF (congestive heart failure) ([place])   ? COPD (chronic obstructive pulmonary disease) ([place])   ? Coronary artery disease   ? Depression   ? Diffuse cystic mastopathy 2013  ? DVT (deep venous thrombosis) ([place])   ? Dyspnea   ? Elevated TSH   ? Family history of adverse reaction to anesthesia   ? Pt stated that son had a seizure with a combination of anesthesia and pain medication."  ? Fatty liver   ? GERD (gastroesophageal reflux disease)   ? Glaucoma   ? High cholesterol   ? History of chicken pox   ? Hyperglycemia   ? Hyperlipidemia   ?  Hypertension   ? LVH (left ventricular hypertrophy)   ? PAC (premature atrial contraction)   ? Palpitations   ? Peripheral vascular disease ([place])   ? Personal history of radiation therapy 2016  ? BREAST CA  ? Pneumonia March 2014  ? Skin cancer 2013  ? Sleep apnea   ? wears CPAP set at 2.5  ? Wears glasses   ? ?Past Surgical History:  ?Procedure Laterality Date  ? [place]  ? ruptured disc  ? BREAST BIOPSY Left 2008  ? NEG  ? BREAST BIOPSY Left 08-20-14  ? POS  ? BREAST BIOPSY Right 2007  ? NEG  ? BREAST EXCISIONAL BIOPSY Left 1984  ? NEG  ? BREAST LUMPECTOMY Left 08/2014  ? DCIS  ? BREAST SURGERY Left 09/05/2014  ? T1a,N0; 5 mm  ER/PR positive. HER2 negative.  Wide excision with SLN biopsy.  MammoSite radiation  ? CARPAL TUNNEL RELEASE    ? CHOLECYSTECTOMY  2006  ? COLONOSCOPY  2010  ? Dr. Jamal Collin  ? COLONOSCOPY WITH PROPOFOL N/A 05/17/2017  ? Procedure: COLONOSCOPY WITH PROPOFOL;  Surgeon: Manya Silvas, MD;  Location[facility] ENDOSCOPY;  Service: Endoscopy;  Laterality: N/A;  ? DILATION AND CURETTAGE OF UTERUS    ? ESOPHAGOGASTRODUODENOSCOPY (EGD) WITH PROPOFOL N/A 05/17/2017  ? Procedure: ESOPHAGOGASTRODUODENOSCOPY (EGD) WITH PROPOFOL;  Surgeon:  Manya Silvas, MD;  [facility] ENDOSCOPY;  Service: Endoscopy;  Laterality: N/A;  ? LAPAROTOMY FOR REMOVAL TUMOR LUMBAR PLEXES    ? Leg stent  2011  ? MOLE REMOVAL  2013  ? 15 removed  ? POSTERIOR CERVICAL LAMINECTOMY Left 10/15/2015  ? Procedure: Left Cervical four- five Hemilaminectomy/Remove mass;  Surgeon: Leeroy Cha, MD;  Location: [place] NEURO ORS;  Service: Neurosurgery;  Laterality: Left;  Left C4-5 Hemilaminectomy/Remove mass  ? ?Family History  ?Problem Relation Age of Onset  ? Cancer Father   ?     Prostate  ? Diabetes Father   ? Cerebrovascular Accident Father   ? Hypertension Mother   ? AAA (abdominal aortic aneurysm) Mother   ? Hyperlipidemia Other   ?     Parent  ? Miscarriages / Stillbirths Other   ?     Parent  ? Hypertension Other   ?      parent  ? Heart disease Other   ?     Parent  ? Breast cancer Maternal Aunt 72  ? ?Social History  ? ?Socioeconomic History  ? Marital status: Married  ?  Spouse name: Not on file  ? Number of children: 3  ? Years of education: 14  ? Highest education level: Not on file  ?Occupational History  ? Occupation: Caregiver   ?Tobacco Use  ? Smoking status: Never  ? Smokeless tobacco: Never  ?Substance and Sexual Activity  ? Alcohol use: No  ?  Alcohol/week: 0.0 standard drinks  ? Drug use: No  ? Sexual activity: Never  ?Other Topics Concern  ? Not on file  ?Social History Narrative  ? Regular exercise-mo  ? Caffeine Use-no  ? ?Social Determinants of Health  ? ?Financial Resource Strain: Medium Risk  ? Difficulty of Paying Living Expenses: Somewhat hard  ?Food Insecurity: No Food Insecurity  ? Worried About Charity fundraiser in the Last Year: Never true  ? Ran Out of Food in the Last Year: Never true  ?Transportation Needs: No Transportation Needs  ? Lack of Transportation (Medical): No  ? Lack of Transportation (Non-Medical): No  ?Physical Activity: Sufficiently Active  ? Days of Exercise per Week: 7 days  ? Minutes of Exercise per Session: 30 min  ?Stress: No Stress Concern Present  ? Feeling of Stress : Only a little  ?Social Connections: Socially Integrated  ? Frequency of Communication with Friends and Family: More than three times a week  ? Frequency of Social Gatherings with Friends and Family: More than three times a week  ? Attends Religious Services: More than 4 times per year  ? Active Member of Clubs or Organizations: Yes  ? Attends Archivist Meetings: More than 4 times per year  ? Marital Status: Married  ? ? ? ?Review of Systems  ?Constitutional:  Negative for appetite change and unexpected weight change.  ?HENT:  Negative for congestion, sinus pressure and sore throat.   ?Eyes:  Negative for pain and visual disturbance.  ?Respiratory:  Negative for cough, chest tightness and shortness of breath.    ?Cardiovascular:  Negative for chest pain, palpitations and leg swelling.  ?Gastrointestinal:  Negative for abdominal pain, diarrhea, nausea and vomiting.  ?Genitourinary:  Negative for difficulty urinating and dysuria.  ?Musculoskeletal:  Negative for joint swelling and myalgias.  ?Skin:  Negative for color change and rash.  ?Neurological:  Negative for dizziness, light-headedness and headaches.  ?Hematological:  Negative for adenopathy. Does not bruise/bleed easily.  ?  Psychiatric/Behavioral:  Negative for agitation and dysphoric mood.   ? ?   ?Objective:  ?  ? ?BP 122/76   Pulse 60   Temp 97.9 ?F (36.6 ?C)   Resp 16   Ht $R'5\' 5"'nH$  (1.651 m)   Wt 190 lb (86.2 kg)   SpO2 98%   BMI 31.62 kg/m?  ?Wt Readings from Last 3 Encounters:  ?07/28/21 190 lb (86.2 kg)  ?05/19/21 196 lb (88.9 kg)  ?03/17/21 196 lb (88.9 kg)  ? ? ?Physical Exam ?Vitals reviewed.  ?Constitutional:   ?   General: She is not in acute distress. ?   Appearance: Normal appearance.  ?HENT:  ?   Head: Normocephalic and atraumatic.  ?   Right Ear: External ear normal.  ?   Left Ear: External ear normal.  ?Eyes:  ?   General: No scleral icterus.    ?   Right eye: No discharge.     ?   Left eye: No discharge.  ?   Conjunctiva/sclera: Conjunctivae normal.  ?Neck:  ?   Thyroid: No thyromegaly.  ?Cardiovascular:  ?   Rate and Rhythm: Normal rate and regular rhythm.  ?Pulmonary:  ?   Effort: No respiratory distress.  ?   Breath sounds: Normal breath sounds. No wheezing.  ?   Comments: Breasts:  right breast - no nipple discharge or nipple retraction present.  Could not appreciate any distinct nodule or axillary adenopathy to be present.  Left breast - palpable increased soft tissue/firm density - s/p previous surgery.   ?Abdominal:  ?   General: Bowel sounds are normal.  ?   Palpations: Abdomen is soft.  ?   Tenderness: There is no abdominal tenderness.  ?Musculoskeletal:     ?   General: No swelling or tenderness.  ?   Cervical back: Neck supple. No  tenderness.  ?Lymphadenopathy:  ?   Cervical: No cervical adenopathy.  ?Skin: ?   Findings: No erythema or rash.  ?Neurological:  ?   Mental Status: She is alert.  ?Psychiatric:     ?   Mood and Affect: Mood normal.

## 2021-08-03 ENCOUNTER — Encounter: Payer: Self-pay | Admitting: Internal Medicine

## 2021-08-03 NOTE — Assessment & Plan Note (Signed)
On Crestor.  Low-cholesterol diet and exercise.  Follow-up lipid panel liver function test. ?

## 2021-08-03 NOTE — Assessment & Plan Note (Signed)
Fatty liver found on previous CT.  Diet and exercise.  Follow liver function tests.  

## 2021-08-03 NOTE — Assessment & Plan Note (Signed)
Increased stress.  Discussed.  Will notify me if feels needs any further intervention.  Follow  

## 2021-08-03 NOTE — Assessment & Plan Note (Signed)
Evaluated by Dr Solum.  Biopsy negative.  Follow tsh.   

## 2021-08-03 NOTE — Assessment & Plan Note (Signed)
On jardiance.  a1c 7.0.  Low carb diet and exercise.  Discussed additional medication.  She wants to hold on further medication.  Follow met b and a1c.  ?

## 2021-08-03 NOTE — Assessment & Plan Note (Addendum)
Evaluated by Dr Lucky Cowboy 04/2018 - 1-39% carotid ultrasound.  Recommended f/u in 2 years.  Overdue.  Wants to hold on f/u.  Will notify when agreeable.  ?

## 2021-08-03 NOTE — Assessment & Plan Note (Signed)
Followed by cardiology.  No pain.  Continue risk factor modification.  Continue crestor.  Followed by cardiology.  W/up as outlined.   ?

## 2021-08-03 NOTE — Assessment & Plan Note (Signed)
Avoid anti-inflammatories..  Stay hydrated. Follow met b.  ?

## 2021-08-03 NOTE — Assessment & Plan Note (Signed)
Breathing overall stable.  Recent covid infection.  No sob.  No residual symptoms.  Follow.  ?

## 2021-08-03 NOTE — Assessment & Plan Note (Signed)
Continue losartan/hctz, coreg and hydralazine.  Follow pressures.  Follow metabolic panel. 

## 2021-08-03 NOTE — Assessment & Plan Note (Signed)
Completed femara.  Mammogram 12/2020 - Birads I.  ?

## 2021-08-03 NOTE — Assessment & Plan Note (Signed)
No upper symptom reported.  On prilosec.  

## 2021-08-03 NOTE — Assessment & Plan Note (Addendum)
Followed by a AVVS.  Continue aspirin and Crestor. evaluated in 04/2018.  Per note, recommended f/u in 2 years. Overdue f/u.  She wants to hold on f/u at this time.  Notify when agreeable.  ?

## 2021-08-28 ENCOUNTER — Other Ambulatory Visit: Payer: Self-pay | Admitting: Internal Medicine

## 2021-10-08 DIAGNOSIS — G4733 Obstructive sleep apnea (adult) (pediatric): Secondary | ICD-10-CM | POA: Diagnosis not present

## 2021-10-09 ENCOUNTER — Telehealth: Payer: Self-pay | Admitting: Internal Medicine

## 2021-10-09 ENCOUNTER — Other Ambulatory Visit: Payer: Self-pay

## 2021-10-09 NOTE — Telephone Encounter (Signed)
Pt need refill on meclizine for dizziness sent to [facility] on [address].

## 2021-10-10 MED ORDER — MECLIZINE HCL 25 MG PO TABS: 25.0000 mg | ORAL_TABLET | Freq: Every day | ORAL | 0 refills | Status: DC | PRN

## 2021-10-10 NOTE — Telephone Encounter (Signed)
Pt returned call and said the dizziness is every once in awhile she thinks its vertigo. Pt is having her ears cleaned at the end of the month and it plays a big factor. Pt said the dizziness is not as bad as it was and it happens two or three times

## 2021-10-10 NOTE — Telephone Encounter (Signed)
Lm for pt to cb re : med dc .. is she having increased dizziness?  Needs eval if so

## 2021-10-10 NOTE — Telephone Encounter (Signed)
Given a persistent intermittent issue and no new symptoms, I sent in rx for meclizine.  Any change or worsening symptoms, she needs to be evaluated.

## 2021-10-10 NOTE — Telephone Encounter (Signed)
S/w pt -  States not increased dizziness , happens intermittently two or three times per month.  Does have hx of vertigo, and ear issues.  Was told to keep meclizine on hand.  Does have appt w/ you 6/20 .   Ok to send in meclizine ?

## 2021-10-23 ENCOUNTER — Other Ambulatory Visit (INDEPENDENT_AMBULATORY_CARE_PROVIDER_SITE_OTHER): Payer: Medicare Other

## 2021-10-23 DIAGNOSIS — E1159 Type 2 diabetes mellitus with other circulatory complications: Secondary | ICD-10-CM

## 2021-10-23 DIAGNOSIS — E7800 Pure hypercholesterolemia, unspecified: Secondary | ICD-10-CM | POA: Diagnosis not present

## 2021-10-23 LAB — HEPATIC FUNCTION PANEL
ALT: 15 U/L (ref 0–35)
AST: 14 U/L (ref 0–37)
Albumin: 4.2 g/dL (ref 3.5–5.2)
Alkaline Phosphatase: 47 U/L (ref 39–117)
Bilirubin, Direct: 0.2 mg/dL (ref 0.0–0.3)
Total Bilirubin: 0.6 mg/dL (ref 0.2–1.2)
Total Protein: 7.3 g/dL (ref 6.0–8.3)

## 2021-10-23 LAB — BASIC METABOLIC PANEL
BUN: 27 mg/dL — ABNORMAL HIGH (ref 6–23)
CO2: 25 mEq/L (ref 19–32)
Calcium: 9.3 mg/dL (ref 8.4–10.5)
Chloride: 102 mEq/L (ref 96–112)
Creatinine, Ser: 1.41 mg/dL — ABNORMAL HIGH (ref 0.40–1.20)
GFR: 36.06 mL/min — ABNORMAL LOW (ref >60.00)
Glucose, Bld: 123 mg/dL — ABNORMAL HIGH (ref 70–99)
Potassium: 3.9 mEq/L (ref 3.5–5.1)
Sodium: 139 mEq/L (ref 135–145)

## 2021-10-23 LAB — LDL CHOLESTEROL, DIRECT: Direct LDL: 70 mg/dL

## 2021-10-23 LAB — HEMOGLOBIN A1C: Hgb A1c MFr Bld: 7 % — ABNORMAL HIGH (ref 4.6–6.5)

## 2021-10-23 LAB — LIPID PANEL
Cholesterol: 136 mg/dL (ref 0–200)
HDL: 38.5 mg/dL — ABNORMAL LOW (ref >39.00)
NonHDL: 97.72
Total CHOL/HDL Ratio: 4
Triglycerides: 223 mg/dL — ABNORMAL HIGH (ref 0.0–149.0)
VLDL: 44.6 mg/dL — ABNORMAL HIGH (ref 0.0–40.0)

## 2021-10-24 DIAGNOSIS — H6123 Impacted cerumen, bilateral: Secondary | ICD-10-CM | POA: Diagnosis not present

## 2021-10-24 DIAGNOSIS — H6063 Unspecified chronic otitis externa, bilateral: Secondary | ICD-10-CM | POA: Diagnosis not present

## 2021-10-26 ENCOUNTER — Other Ambulatory Visit: Payer: Self-pay | Admitting: Internal Medicine

## 2021-10-28 ENCOUNTER — Ambulatory Visit: Payer: Medicare Other | Admitting: Internal Medicine

## 2021-10-30 ENCOUNTER — Ambulatory Visit (INDEPENDENT_AMBULATORY_CARE_PROVIDER_SITE_OTHER): Payer: Medicare Other | Admitting: Internal Medicine

## 2021-10-30 ENCOUNTER — Encounter: Payer: Self-pay | Admitting: Internal Medicine

## 2021-10-30 VITALS — BP 140/70 | HR 63 | Temp 98.2°F | Resp 14 | Ht 65.0 in | Wt 193.0 lb

## 2021-10-30 DIAGNOSIS — R7989 Other specified abnormal findings of blood chemistry: Secondary | ICD-10-CM | POA: Diagnosis not present

## 2021-10-30 DIAGNOSIS — I779 Disorder of arteries and arterioles, unspecified: Secondary | ICD-10-CM

## 2021-10-30 DIAGNOSIS — I10 Essential (primary) hypertension: Secondary | ICD-10-CM | POA: Diagnosis not present

## 2021-10-30 DIAGNOSIS — R519 Headache, unspecified: Secondary | ICD-10-CM | POA: Insufficient documentation

## 2021-10-30 DIAGNOSIS — J449 Chronic obstructive pulmonary disease, unspecified: Secondary | ICD-10-CM | POA: Diagnosis not present

## 2021-10-30 DIAGNOSIS — F439 Reaction to severe stress, unspecified: Secondary | ICD-10-CM

## 2021-10-30 DIAGNOSIS — E7800 Pure hypercholesterolemia, unspecified: Secondary | ICD-10-CM

## 2021-10-30 DIAGNOSIS — E041 Nontoxic single thyroid nodule: Secondary | ICD-10-CM

## 2021-10-30 DIAGNOSIS — N289 Disorder of kidney and ureter, unspecified: Secondary | ICD-10-CM | POA: Diagnosis not present

## 2021-10-30 DIAGNOSIS — M545 Low back pain, unspecified: Secondary | ICD-10-CM | POA: Diagnosis not present

## 2021-10-30 DIAGNOSIS — E1159 Type 2 diabetes mellitus with other circulatory complications: Secondary | ICD-10-CM | POA: Diagnosis not present

## 2021-10-30 DIAGNOSIS — Z86010 Personal history of colonic polyps: Secondary | ICD-10-CM

## 2021-10-30 DIAGNOSIS — I25119 Atherosclerotic heart disease of native coronary artery with unspecified angina pectoris: Secondary | ICD-10-CM

## 2021-10-30 DIAGNOSIS — K219 Gastro-esophageal reflux disease without esophagitis: Secondary | ICD-10-CM

## 2021-10-30 DIAGNOSIS — N1831 Chronic kidney disease, stage 3a: Secondary | ICD-10-CM | POA: Diagnosis not present

## 2021-10-30 DIAGNOSIS — Z853 Personal history of malignant neoplasm of breast: Secondary | ICD-10-CM

## 2021-10-30 LAB — BASIC METABOLIC PANEL
BUN: 20 mg/dL (ref 6–23)
CO2: 26 mEq/L (ref 19–32)
Calcium: 9.3 mg/dL (ref 8.4–10.5)
Chloride: 102 mEq/L (ref 96–112)
Creatinine, Ser: 1.21 mg/dL — ABNORMAL HIGH (ref 0.40–1.20)
GFR: 43.32 mL/min — ABNORMAL LOW (ref >60.00)
Glucose, Bld: 128 mg/dL — ABNORMAL HIGH (ref 70–99)
Potassium: 4.3 mEq/L (ref 3.5–5.1)
Sodium: 138 mEq/L (ref 135–145)

## 2021-10-30 LAB — URINALYSIS, ROUTINE W REFLEX MICROSCOPIC
Bilirubin Urine: NEGATIVE
Hgb urine dipstick: NEGATIVE
Ketones, ur: NEGATIVE
Leukocytes,Ua: NEGATIVE
Nitrite: NEGATIVE
RBC / HPF: NONE SEEN (ref 0–?)
Specific Gravity, Urine: <=1.005 — AB (ref 1.000–1.030)
Total Protein, Urine: NEGATIVE
Urine Glucose: >=1000 — AB
Urobilinogen, UA: 0.2 (ref 0.0–1.0)
pH: 6 (ref 5.0–8.0)

## 2021-10-30 LAB — MICROALBUMIN / CREATININE URINE RATIO
Creatinine,U: 34.6 mg/dL
Microalb Creat Ratio: 2.8 mg/g (ref 0.0–30.0)
Microalb, Ur: 1 mg/dL (ref 0.0–1.9)

## 2021-10-30 LAB — SEDIMENTATION RATE: Sed Rate: 20 mm/hr (ref 0–30)

## 2021-10-30 NOTE — Assessment & Plan Note (Addendum)
On jardiance.  a1c 7.0.  Low carb diet and exercise.  Discussed additional medication.  She wants to hold on further medication.  Follow met b and a1c. Eye exam scheduled for 01/2022.  

## 2021-10-30 NOTE — Assessment & Plan Note (Signed)
Followed by a AVVS.  Continue aspirin and Crestor. evaluated in 04/2018.  Per note, recommended f/u in 2 years. Overdue f/u.  She had wanted to hold on f/u at this time.  Notify when agreeable.

## 2021-10-30 NOTE — Assessment & Plan Note (Signed)
Breathing overall stable.  Recent covid infection.  No sob.  No residual symptoms.  Follow.

## 2021-10-30 NOTE — Assessment & Plan Note (Signed)
Last colonoscopy January 2019 (Dr. Vira Agar).  Per patient recommended follow-up in 3 years.  Overdue follow-up. Discussed. Declines.

## 2021-10-30 NOTE — Assessment & Plan Note (Signed)
Avoid anti-inflammatories..  Stay hydrated.  Recent GFR decrease more.  Recheck met b today. Also check urinalysis.

## 2021-10-30 NOTE — Assessment & Plan Note (Signed)
Increased stress.  Discussed.  Will notify me if feels needs any further intervention.  Follow  

## 2021-10-30 NOTE — Assessment & Plan Note (Signed)
Completed femara.  Mammogram 12/2020 - Birads I.

## 2021-10-30 NOTE — Assessment & Plan Note (Signed)
No upper symptom reported.  On prilosec.  

## 2021-10-30 NOTE — Assessment & Plan Note (Signed)
Followed by cardiology.  No pain.  Continue risk factor modification.  Continue crestor.  Followed by cardiology.  W/up as outlined.

## 2021-10-30 NOTE — Assessment & Plan Note (Signed)
Evaluated by Dr Lucky Cowboy 04/2018 - 1-39% carotid ultrasound.  Recommended f/u in 2 years.  Overdue. Had previously declined. Notify when agreeable.

## 2021-10-30 NOTE — Progress Notes (Signed)
[name], female   DOB: May 17, 1945, 76 y.o.   MRN: [id number]   Subjective:    [name], female    DOB: 27-Aug-1945, 76 y.o.   MRN: [id number]   Patient here for a scheduled follow up.   Chief Complaint  Patient presents with   Diabetes   .   HPI Saw Dr Nehemiah Massed.  The holter monitor shows occasional PVCs, PACs, supraventricular tachycardia, sinus tachycardia and asymptomatic bradycardia. Stress Test - Persantine SESTAMIBI was performed showing Normal test. Echocardiogram showing Normal LV LVEF > 55% Mild MR Mild TR without pulmonary hypertension. Feels from a cardiac standpoint things are stable.  No chest pain.  Breathing stable.  She has recently noticed a dull headache - located posteriorly and frontal.  Started approximately two weeks ago.  Is more constant now.  Was experiencing some dizziness.  Saw ENT.  Had ears cleaned.  No dizziness now.  Headache - more constant - not severe.  No cough or congestion.  No acid reflux.  No abdominal pain.  Does report some intermittent left side back pain.  No significant pain now.  Declines colonoscopy.  Scheduled for eye exam 01/2022.  States blood pressures are averaging 130s/50-60s. Increased stress with her husband's medical issues.  Discussed.  Not sleeping as well because he is up and down.      Past Medical History:  Diagnosis Date   Arthritis    Breast cancer ([place])    Breast cancer, left ([place]) 2016   LT LUMPECTOMY - TI, NO, MO - IDC, ER/PR pos, Her 2 neg, Rad tx's.    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   CHF (congestive heart failure) (HCC)    COPD (chronic obstructive pulmonary disease) (HCC)    Coronary artery disease    Depression    Diffuse cystic mastopathy 2013   DVT (deep venous thrombosis) (HCC)    Dyspnea    Elevated TSH    Family history of adverse reaction to anesthesia    Pt stated that son had a seizure  with a combination of anesthesia and pain medication."   Fatty liver    GERD (gastroesophageal reflux disease)    Glaucoma    High cholesterol    History of chicken pox    Hyperglycemia    Hyperlipidemia    Hypertension    LVH (left ventricular hypertrophy)    PAC (premature atrial contraction)    Palpitations    Peripheral vascular disease ([place])    Personal history of radiation therapy 2016   BREAST CA   Pneumonia March 2014   Skin cancer 2013   Sleep apnea    wears CPAP set at 2.5   Wears glasses    Past Surgical History:  Procedure Laterality Date   BACK SURGERY  1986   ruptured disc   BREAST BIOPSY Left 2008   NEG   BREAST BIOPSY Left 08-20-14   POS   BREAST BIOPSY Right 2007   NEG   BREAST EXCISIONAL BIOPSY Left 1984   NEG   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 HER2 negative.  Wide excision with SLN biopsy.  MammoSite radiation   CARPAL TUNNEL RELEASE     CHOLECYSTECTOMY  2006   COLONOSCOPY  2010   Dr. Jamal Collin   COLONOSCOPY WITH  PROPOFOL N/A 05/17/2017   Procedure: COLONOSCOPY WITH PROPOFOL;  Surgeon: Manya Silvas, MD;  Location[facility] ENDOSCOPY;  Service: Endoscopy;  Laterality: N/A;   DILATION AND CURETTAGE OF UTERUS     ESOPHAGOGASTRODUODENOSCOPY (EGD) WITH PROPOFOL N/A 05/17/2017   Procedure: ESOPHAGOGASTRODUODENOSCOPY (EGD) WITH PROPOFOL;  Surgeon: Manya Silvas, MD;  Location: [facility] ENDOSCOPY;  Service: Endoscopy;  Laterality: N/A;   LAPAROTOMY FOR REMOVAL TUMOR LUMBAR PLEXES     Leg stent  2011   MOLE REMOVAL  2013   15 removed   POSTERIOR CERVICAL LAMINECTOMY Left 10/15/2015   Procedure: Left Cervical four- five Hemilaminectomy/Remove mass;  Surgeon: Leeroy Cha, MD;  Location: [place] NEURO ORS;  Service: Neurosurgery;  Laterality: Left;  Left C4-5 Hemilaminectomy/Remove mass   Family History  Problem Relation Age of Onset   Cancer Father        Prostate   Diabetes Father     Cerebrovascular Accident Father    Hypertension Mother    AAA (abdominal aortic aneurysm) Mother    Hyperlipidemia Other        Parent   Miscarriages / Stillbirths Other        Parent   Hypertension Other        parent   Heart disease Other        Parent   Breast cancer Maternal Aunt 72   Social History   Socioeconomic History   Marital status: Married    Spouse name: Not on file   Number of children: 3   Years of education: 12   Highest education level: Not on file  Occupational History   Occupation: Caregiver   Tobacco Use   Smoking status: Never   Smokeless tobacco: Never  Substance and Sexual Activity   Alcohol use: No    Alcohol/week: 0.0 standard drinks of alcohol   Drug use: No   Sexual activity: Never  Other Topics Concern   Not on file  Social History Narrative   Regular exercise-mo   Caffeine Use-no   Social Determinants of Health   Financial Resource Strain: Medium Risk (07/14/2021)   Overall Financial Resource Strain (CARDIA)    Difficulty of Paying Living Expenses: Somewhat hard  Food Insecurity: No Food Insecurity (11/20/2020)   Hunger Vital Sign    Worried About Running Out of Food in the Last Year: Never true    Ran Out of Food in the Last Year: Never true  Transportation Needs: No Transportation Needs (11/20/2020)   PRAPARE - Hydrologist (Medical): No    Lack of Transportation (Non-Medical): No  Physical Activity: Sufficiently Active (11/20/2020)   Exercise Vital Sign    Days of Exercise per Week: 7 days    Minutes of Exercise per Session: 30 min  Stress: No Stress Concern Present (11/20/2020)   [place]    Feeling of Stress : Only a little  Social Connections: Socially Integrated (11/20/2020)   Social Connection and Isolation Panel [NHANES]    Frequency of Communication with Friends and Family: More than three times a week    Frequency of Social  Gatherings with Friends and Family: More than three times a week    Attends Religious Services: More than 4 times per year    Active Member of [employer] or Organizations: Yes    Attends Music therapist: More than 4 times per year    Marital Status: Married     Review of Systems  Constitutional:  Negative for appetite change and fever.  HENT:  Negative for congestion and sinus pressure.   Respiratory:  Negative for cough, chest tightness and shortness of breath.   Cardiovascular:  Negative for chest pain, palpitations and leg swelling.  Gastrointestinal:  Negative for abdominal pain, diarrhea, nausea and vomiting.  Genitourinary:  Negative for difficulty urinating and dysuria.  Musculoskeletal:  Positive for back pain. Negative for joint swelling and myalgias.  Skin:  Negative for color change and rash.  Neurological:  Positive for headaches.       No dizziness now.    Psychiatric/Behavioral:  Negative for agitation and dysphoric mood.        Increased stress.        Objective:     BP 140/70 (BP Location: Right Arm, Patient Position: Sitting, Cuff Size: Large)   Pulse 63   Temp 98.2 F (36.8 C) (Temporal)   Resp 14   Ht _0  (1.651 m)   Wt 193 lb (87.5 kg)   SpO2 99%   BMI 32.12 kg/m  Wt Readings from Last 3 Encounters:  10/30/21 193 lb (87.5 kg)  07/28/21 190 lb (86.2 kg)  05/19/21 196 lb (88.9 kg)    Physical Exam Vitals reviewed.  Constitutional:      General: She is not in acute distress.    Appearance: Normal appearance.  HENT:     Head: Normocephalic and atraumatic.     Right Ear: External ear normal.     Left Ear: External ear normal.  Eyes:     General: No scleral icterus.       Right eye: No discharge.        Left eye: No discharge.     Conjunctiva/sclera: Conjunctivae normal.  Neck:     Thyroid: No thyromegaly.  Cardiovascular:     Rate and Rhythm: Normal rate and regular rhythm.  Pulmonary:     Effort: No respiratory distress.      Breath sounds: Normal breath sounds. No wheezing.  Abdominal:     General: Bowel sounds are normal.     Palpations: Abdomen is soft.     Tenderness: There is no abdominal tenderness.  Musculoskeletal:        General: No swelling or tenderness.     Cervical back: Neck supple. No tenderness.  Lymphadenopathy:     Cervical: No cervical adenopathy.  Skin:    Findings: No erythema or rash.     Comments: Laceration on right calf - no surrounding erythema.    Neurological:     Mental Status: She is alert.  Psychiatric:        Mood and Affect: Mood normal.        Behavior: Behavior normal.      Outpatient Encounter Medications as of 10/30/2021  Medication Sig   acetaminophen (TYLENOL) 650 MG CR tablet Take 650 mg by mouth as needed for pain.   aspirin 325 MG tablet Take 325 mg by mouth daily.   carvedilol (COREG) 12.5 MG tablet TAKE 1 TABLET(12.5 MG) BY MOUTH TWICE DAILY WITH MEALS   Cholecalciferol 25 MCG (1000 UT) tablet Take 1,000 Units by mouth daily.    empagliflozin (JARDIANCE) 25 MG TABS tablet Take 1 tablet (25 mg total) by mouth daily before breakfast.   glucose blood (ONETOUCH VERIO) test strip Use to check blood sugars up to three times daily.   hydrALAZINE (APRESOLINE) 10 MG tablet TAKE 1 TABLET(10 MG) BY MOUTH THREE TIMES DAILY   losartan-hydrochlorothiazide (HYZAAR)  100-25 MG tablet TAKE 1 TABLET BY MOUTH DAILY   meclizine (ANTIVERT) 25 MG tablet Take 1 tablet (25 mg total) by mouth daily as needed for dizziness.   omeprazole (PRILOSEC) 40 MG capsule TAKE 1 CAPSULE(40 MG) BY MOUTH DAILY   OneTouch Delica Lancets 20U MISC Use to check blood sugars up to 3 times daily.   rosuvastatin (CRESTOR) 40 MG tablet TAKE 1 TABLET(40 MG) BY MOUTH DAILY   vitamin E 400 UNIT capsule Take 400 Units by mouth daily.   No facility-administered encounter medications on file as of 10/30/2021.     Lab Results  Component Value Date   WBC 5.8 02/04/2021   HGB 13.0 02/04/2021   HCT 37.7  02/04/2021   PLT 168.0 02/04/2021   GLUCOSE 123 (H) 10/23/2021   CHOL 136 10/23/2021   TRIG 223.0 (H) 10/23/2021   HDL 38.50 (L) 10/23/2021   LDLDIRECT 70.0 10/23/2021   LDLCALC 64 04/15/2017   ALT 15 10/23/2021   AST 14 10/23/2021   NA 139 10/23/2021   K 3.9 10/23/2021   CL 102 10/23/2021   CREATININE 1.41 (H) 10/23/2021   BUN 27 (H) 10/23/2021   CO2 25 10/23/2021   TSH 4.44 07/25/2021   INR 0.9 07/13/2012   HGBA1C 7.0 (H) 10/23/2021   MICROALBUR 1.5 10/16/2020    MM 3D SCREEN BREAST BILATERAL  Result Date: 12/18/2020 CLINICAL DATA:  Screening. EXAM: DIGITAL SCREENING BILATERAL MAMMOGRAM WITH TOMOSYNTHESIS AND CAD TECHNIQUE: Bilateral screening digital craniocaudal and mediolateral oblique mammograms were obtained. Bilateral screening digital breast tomosynthesis was performed. The images were evaluated with computer-aided detection. COMPARISON:  Previous exam(s). ACR Breast Density Category c: The breast tissue is heterogeneously dense, which may obscure small masses. FINDINGS: There are no findings suspicious for malignancy. IMPRESSION: No mammographic evidence of malignancy. A result letter of this screening mammogram will be mailed directly to the patient. RECOMMENDATION: Screening mammogram in one year. (Code:SM-B-01Y) BI-RADS CATEGORY  1: Negative. Electronically Signed   By: Ammie Ferrier M.D.   On: 12/18/2020 14:54  DG Bone Density  Result Date: 12/18/2020 EXAM: DUAL X-RAY ABSORPTIOMETRY (DXA) FOR BONE MINERAL DENSITY IMPRESSION: Dear Dr. Nicki Reaper, Your patient JASLINE BUSKIRK completed a FRAX assessment on 12/18/2020 using the [place] (analysis version: 14.10) manufactured by [employer]. The following summarizes the results of our evaluation. PATIENT BIOGRAPHICAL: Name: Jonnie, Kubly Patient ID: [id number] Birth Date: 12/16/1945 Height:    64.0 in. Gender:     Female    [age]       Weight:    195.6 lbs. Ethnicity:  White                             Exam Date: 12/18/2020 FRAX* RESULTS:  (version: 3.5) 10-year Probability of Fracture1 Major Osteoporotic Fracture2 Hip Fracture 17.8% 3.7% Population: [place] (Caucasian) Risk Factors: History of Fracture (Adult) Based on Femur (Right) Neck BMD 1 -The 10-year probability of fracture may be lower than reported if the patient has received treatment. 2 -Major Osteoporotic Fracture: Clinical Spine, Forearm, Hip or Shoulder *FRAX is a Materials engineer of the [employer] for Metabolic Bone Disease, a [place] (WHO) [facility]. ASSESSMENT: The probability of a major osteoporotic fracture is 17.8% within the next ten years. The probability of a hip fracture is 3.7% within the next ten years. . Your patient Lujuana Kapler completed a  BMD test on 12/18/2020 using the [place] (software version: 14.10) manufactured by [employer]. The following summarizes the results of our evaluation. Technologist: [facility] PATIENT BIOGRAPHICAL: Name: Jumanah, Hynson Patient ID: [id number] Birth Date: 11-Aug-1945 Height: 64.0 in. Gender: Female Exam Date: 12/18/2020 Weight: 195.6 lbs. Indications: Advanced Age, Caucasian, Diabetic, Early Menopause, Height Loss, History of Breast Cancer, History of Fracture (Adult), History of Radiation, Postmenopausal Fractures: Left finger, Left wrist Treatments: aspirin, Jardiance, Omeprazole, Vitamin D DENSITOMETRY RESULTS: Site          Region     Measured Date Measured Age WHO Classification Young Adult T-score BMD         %Change vs. Previous Significant Change (*) DualFemur Neck Right 12/18/2020 76.3 Osteopenia -1.7 0.801 g/cm2 -0.7% - DualFemur Neck Right 10/05/2017 73.1 Osteopenia -1.7 0.807 g/cm2 -1.3% - DualFemur Neck Right 02/01/2014 69.4 Osteopenia -1.6 0.818 g/cm2 - - DualFemur Total Mean 12/18/2020 76.3 Osteopenia -1.1 0.874 g/cm2 0.2% - DualFemur Total Mean 10/05/2017 73.1 Osteopenia -1.1 0.872 g/cm2 0.5% - DualFemur Total  Mean 02/01/2014 69.4 Osteopenia -1.1 0.868 g/cm2 - - Right Forearm Radius 33% 12/18/2020 76.3 Normal -0.8 0.806 g/cm2 -8.1% Yes Right Forearm Radius 33% 02/01/2014 69.4 Normal 0.0 0.877 g/cm2 - - ASSESSMENT: The BMD measured at Femur Neck Right is 0.801 g/cm2 with a T-score of -1.7. This patient is considered osteopenic according to [facility]) criteria. The scan quality is good. Lumbar spine was not utilized due to advanced degenerative changes. Compared with prior study, there has been no significant change in the total hip. World Pharmacologist [facility]) criteria for post-menopausal, Caucasian Women: Normal:                   T-score at or above -1 SD Osteopenia/low bone mass: T-score between -1 and -2.5 SD Osteoporosis:             T-score at or below -2.5 SD RECOMMENDATIONS: 1. All patients should optimize calcium and vitamin D intake. 2. Consider FDA-approved medical therapies in postmenopausal women and men aged 42 years and older, based on the following: a. A hip or vertebral(clinical or morphometric) fracture b. T-score < -2.5 at the femoral neck or spine after appropriate evaluation to exclude secondary causes c. Low bone mass (T-score between -1.0 and -2.5 at the femoral neck or spine) and a 10-year probability of a hip fracture > 3% or a 10-year probability of a major osteoporosis-related fracture > 20% based on the [place]-adapted WHO algorithm 3. Clinician judgment and/or patient preferences may indicate treatment for people with 10-year fracture probabilities above or below these levels FOLLOW-UP: People with diagnosed cases of osteoporosis or at high risk for fracture should have regular bone mineral density tests. For patients eligible for Medicare, routine testing is allowed once every 2 years. The testing frequency can be increased to one year for patients who have rapidly progressing disease, those who are receiving or discontinuing medical therapy to restore bone mass, or have  additional risk factors. I have reviewed this report, and agree with the above findings. Mark A. Thornton Papas, M.D. [facility] Electronically Signed   By: Lavonia Dana M.D.   On: 12/18/2020 14:51       Assessment & Plan:   Problem List Items Addressed This Visit     Abnormal liver function test    Fatty liver found on previous CT.  Diet and exercise.  Follow liver function tests. Recent check wnl.  Back pain    Intermittent back pain as outlined.  Better currently.  Follow.  Appears to be aggravated by certain movements.        CAD (coronary artery disease)    Followed by cardiology.  No pain.  Continue risk factor modification.  Continue crestor.  Followed by cardiology.  W/up as outlined.        Carotid artery disease ([place])    Evaluated by Dr Lucky Cowboy 04/2018 - 1-39% carotid ultrasound.  Recommended f/u in 2 years.  Overdue. Had previously declined. Notify when agreeable.       CKD (chronic kidney disease), stage III (HCC)    Avoid anti-inflammatories..  Stay hydrated.  Recent GFR decrease more.  Recheck met b today. Also check urinalysis.       Relevant Orders   Basic Metabolic Panel (BMET)   Urinalysis, Routine w reflex microscopic   COPD (chronic obstructive pulmonary disease) (HCC)    Breathing overall stable.  Recent covid infection.  No sob.  No residual symptoms.  Follow.       Diabetes mellitus with cardiac complication (HCC)    On jardiance.  a1c 7.0.  Low carb diet and exercise.  Discussed additional medication.  She wants to hold on further medication.  Follow met b and a1c. Eye exam scheduled for 01/2022.       Relevant Orders   Microalbumin / creatinine urine ratio   GERD (gastroesophageal reflux disease)    No upper symptom reported.  On prilosec.       Headache    Headache as outlined.  Not sleeping well.  Discussed.  No sinus symptoms.  Check esr.  Discussed if persistent, may need scan, etc.  Was questioning stress.        Relevant Orders    Sedimentation rate   History of breast cancer    Completed femara.  Mammogram 12/2020 - Birads I.       History of colon polyps    Last colonoscopy January 2019 (Dr. Vira Agar).  Per patient recommended follow-up in 3 years.  Overdue follow-up. Discussed. Declines.       Hypercholesterolemia    On Crestor.  Low-cholesterol diet and exercise. Discussed elevated triglycerides.  LDL 70.  Follow-up lipid panel liver function test.      Hypertension    Continue losartan/hctz, coreg and hydralazine.  Follow pressures.  Blood pressure on recheck today 130/82. Follow metabolic panel.       Stress    Increased stress.  Discussed.  Will notify me if feels needs any further intervention.  Follow.       Thyroid nodule    Evaluated by Dr Gabriel Carina.  Biopsy negative.  Follow tsh.        Other Visit Diagnoses     Function kidney decreased    -  Primary        Einar Pheasant, MD

## 2021-10-30 NOTE — Assessment & Plan Note (Signed)
Intermittent back pain as outlined.  Better currently.  Follow.  Appears to be aggravated by certain movements.

## 2021-10-30 NOTE — Assessment & Plan Note (Signed)
Fatty liver found on previous CT.  Diet and exercise.  Follow liver function tests. Recent check wnl.  

## 2021-10-30 NOTE — Assessment & Plan Note (Signed)
Evaluated by Dr Solum.  Biopsy negative.  Follow tsh.   

## 2021-10-30 NOTE — Assessment & Plan Note (Signed)
On Crestor.  Low-cholesterol diet and exercise. Discussed elevated triglycerides.  LDL 70.  Follow-up lipid panel liver function test. 

## 2021-11-05 ENCOUNTER — Other Ambulatory Visit: Payer: Self-pay

## 2021-11-05 MED ORDER — MECLIZINE HCL 25 MG PO TABS: 25.0000 mg | ORAL_TABLET | Freq: Every day | ORAL | 0 refills | Status: AC | PRN

## 2021-11-05 NOTE — Telephone Encounter (Signed)
Pt need refill on meclizine sent to [facility]

## 2021-11-05 NOTE — Telephone Encounter (Signed)
sent 

## 2021-11-12 ENCOUNTER — Telehealth: Payer: Self-pay | Admitting: Internal Medicine

## 2021-11-12 NOTE — Telephone Encounter (Signed)
Pt called in stating that she seen Dr. Nicki Reaper about a week or two ago... Pt stated that she spoke with Dr. Nicki Reaper about back pain... Pt stated that she is still having back pain... Per pt that Dr. Nicki Reaper advised her that if she is still having back pain to call the office... Pt is requesting to speak with you so she can go into more detail... Pt requesting callback

## 2021-11-18 NOTE — Telephone Encounter (Addendum)
Patient is having back pain she is having to baby it, patient husband has alzheimer's and she is taking total care of him. Patient says kidney 's have been checked twice which were okay. Patient has to bend over a lot taking care of husband.  Patient says at times her pain is rated at a 10 when bending but less if not bending. At rest pain rated at 3 , has been taking tylenol arthritis once daily it helps but does not completely ease pain. Patient says he cannot come in due to her husband and she has palliative care coming Thursday to hopefully transfer husband to Hospice care. Scheduled with PCP for 11/27/21 patient ask can she take an extra arthritis strength tylenol?

## 2021-11-18 NOTE — Telephone Encounter (Signed)
She can take one tylenol arthritis tid or two tylenol arthritis bid

## 2021-11-18 NOTE — Telephone Encounter (Signed)
Patient voiced understanding to the instructions for arthritis strength tylenol.

## 2021-11-24 ENCOUNTER — Ambulatory Visit (INDEPENDENT_AMBULATORY_CARE_PROVIDER_SITE_OTHER): Payer: Medicare Other

## 2021-11-24 VITALS — Ht 65.0 in | Wt 193.0 lb

## 2021-11-24 DIAGNOSIS — Z0000 Encounter for general adult medical examination without abnormal findings: Secondary | ICD-10-CM | POA: Diagnosis not present

## 2021-11-24 NOTE — Progress Notes (Addendum)
Subjective:   Lindsey Bentley is a 76 y.o. female who presents for Medicare Annual (Subsequent) preventive examination.  Review of Systems    No ROS.  Medicare Wellness Virtual Visit.  Visual/audio telehealth visit, UTA vital signs.   See social history for additional risk factors.   Cardiac Risk Factors include: advanced age (>40men, >106 women)     Objective:    Today's Vitals   11/24/21 1111  Weight: 193 lb (87.5 kg)  Height: $Remove'5\' 5"'lyPIGkr$  (1.651 m)   Body mass index is 32.12 kg/m.     11/24/2021   11:09 AM 11/20/2020    1:30 PM 12/14/2019   10:48 AM 11/20/2019    1:50 PM 11/16/2018    1:18 PM 06/29/2018    1:40 PM 06/30/2017    1:13 PM  Advanced Directives  Does Patient Have a Medical Advance Directive? No No No No No No No  Would patient like information on creating a medical advance directive? No - Patient declined No - Patient declined No - Patient declined No - Patient declined No - Patient declined No - Patient declined No - Patient declined    Current Medications (verified) Outpatient Encounter Medications as of 11/24/2021  Medication Sig   acetaminophen (TYLENOL) 650 MG CR tablet Take 650 mg by mouth as needed for pain.   aspirin 325 MG tablet Take 325 mg by mouth daily.   carvedilol (COREG) 12.5 MG tablet TAKE 1 TABLET(12.5 MG) BY MOUTH TWICE DAILY WITH MEALS   Cholecalciferol 25 MCG (1000 UT) tablet Take 1,000 Units by mouth daily.    empagliflozin (JARDIANCE) 25 MG TABS tablet Take 1 tablet (25 mg total) by mouth daily before breakfast.   glucose blood (ONETOUCH VERIO) test strip Use to check blood sugars up to three times daily.   hydrALAZINE (APRESOLINE) 10 MG tablet TAKE 1 TABLET(10 MG) BY MOUTH THREE TIMES DAILY   losartan-hydrochlorothiazide (HYZAAR) 100-25 MG tablet TAKE 1 TABLET BY MOUTH DAILY   meclizine (ANTIVERT) 25 MG tablet Take 1 tablet (25 mg total) by mouth daily as needed for dizziness.   omeprazole (PRILOSEC) 40 MG capsule TAKE 1 CAPSULE(40 MG) BY MOUTH  DAILY   OneTouch Delica Lancets 32P MISC Use to check blood sugars up to 3 times daily.   rosuvastatin (CRESTOR) 40 MG tablet TAKE 1 TABLET(40 MG) BY MOUTH DAILY   vitamin E 400 UNIT capsule Take 400 Units by mouth daily.   No facility-administered encounter medications on file as of 11/24/2021.    Allergies (verified) Codeine, Prednisone, and Tape   History: Past Medical History:  Diagnosis Date   Arthritis    Breast cancer ([place])    Breast cancer, left ([place]) 2016   LT LUMPECTOMY - TI, NO, MO - IDC, ER/PR pos, Her 2 neg, Rad tx's.    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   CHF (congestive heart failure) (HCC)    COPD (chronic obstructive pulmonary disease) (HCC)    Coronary artery disease    Depression    Diffuse cystic mastopathy 2013   DVT (deep venous thrombosis) (HCC)    Dyspnea    Elevated TSH    Family history of adverse reaction to anesthesia    Pt stated that son had a seizure with a combination of anesthesia and pain medication."   Fatty liver    GERD (gastroesophageal reflux disease)    Glaucoma  High cholesterol    History of chicken pox    Hyperglycemia    Hyperlipidemia    Hypertension    LVH (left ventricular hypertrophy)    PAC (premature atrial contraction)    Palpitations    Peripheral vascular disease ([place])    Personal history of radiation therapy 2016   BREAST CA   Pneumonia March 2014   Skin cancer 2013   Sleep apnea    wears CPAP set at 2.5   Wears glasses    Past Surgical History:  Procedure Laterality Date   BACK SURGERY  1986   ruptured disc   BREAST BIOPSY Left 2008   NEG   BREAST BIOPSY Left 08-20-14   POS   BREAST BIOPSY Right 2007   NEG   BREAST EXCISIONAL BIOPSY Left 1984   NEG   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 HER2 negative.  Wide excision with SLN biopsy.  MammoSite  radiation   CARPAL TUNNEL RELEASE     CHOLECYSTECTOMY  2006   COLONOSCOPY  2010   Dr. Jamal Collin   COLONOSCOPY WITH PROPOFOL N/A 05/17/2017   Procedure: COLONOSCOPY WITH PROPOFOL;  Surgeon: Manya Silvas, MD;  Location: [facility] ENDOSCOPY;  Service: Endoscopy;  Laterality: N/A;   DILATION AND CURETTAGE OF UTERUS     ESOPHAGOGASTRODUODENOSCOPY (EGD) WITH PROPOFOL N/A 05/17/2017   Procedure: ESOPHAGOGASTRODUODENOSCOPY (EGD) WITH PROPOFOL;  Surgeon: Manya Silvas, MD;  Location: [facility] ENDOSCOPY;  Service: Endoscopy;  Laterality: N/A;   LAPAROTOMY FOR REMOVAL TUMOR LUMBAR PLEXES     Leg stent  2011   MOLE REMOVAL  2013   15 removed   POSTERIOR CERVICAL LAMINECTOMY Left 10/15/2015   Procedure: Left Cervical four- five Hemilaminectomy/Remove mass;  Surgeon: Leeroy Cha, MD;  Location: [facility] NEURO ORS;  Service: Neurosurgery;  Laterality: Left;  Left C4-5 Hemilaminectomy/Remove mass   Family History  Problem Relation Age of Onset   Cancer Father        Prostate   Diabetes Father    Cerebrovascular Accident Father    Hypertension Mother    AAA (abdominal aortic aneurysm) Mother    Hyperlipidemia Other        Parent   Miscarriages / Stillbirths Other        Parent   Hypertension Other        parent   Heart disease Other        Parent   Breast cancer Maternal Aunt 72   Social History   Socioeconomic History   Marital status: Married    Spouse name: Not on file   Number of children: 3   Years of education: 12   Highest education level: Not on file  Occupational History   Occupation: Caregiver   Tobacco Use   Smoking status: Never   Smokeless tobacco: Never  Substance and Sexual Activity   Alcohol use: No    Alcohol/week: 0.0 standard drinks of alcohol   Drug use: No   Sexual activity: Never  Other Topics Concern   Not on file  Social History Narrative   Regular exercise-mo   Caffeine Use-no   Social Determinants of Health   Financial Resource Strain: Medium Risk (07/14/2021)    Overall Financial Resource Strain (CARDIA)    Difficulty of Paying Living Expenses: Somewhat hard  Food Insecurity: No Food Insecurity (11/24/2021)   Hunger Vital Sign    Worried About Running Out of Food in  the Last Year: Never true    [place] in the Last Year: Never true  Transportation Needs: No Transportation Needs (11/24/2021)   PRAPARE - Hydrologist (Medical): No    Lack of Transportation (Non-Medical): No  Physical Activity: Sufficiently Active (11/24/2021)   Exercise Vital Sign    Days of Exercise per Week: 7 days    Minutes of Exercise per Session: 30 min  Stress: No Stress Concern Present (11/24/2021)   [place]    Feeling of Stress : Only a little  Social Connections: Socially Integrated (11/24/2021)   Social Connection and Isolation Panel [NHANES]    Frequency of Communication with Friends and Family: More than three times a week    Frequency of Social Gatherings with Friends and Family: More than three times a week    Attends Religious Services: More than 4 times per year    Active Member of [employer] or Organizations: Yes    Attends Music therapist: More than 4 times per year    Marital Status: Married    Tobacco Counseling Counseling given: Not Answered   Clinical Intake:  Pre-visit preparation completed: Yes        Diabetes: Yes (Followed by PCP)  How often do you need to have someone help you when you read instructions, pamphlets, or other written materials from your doctor or pharmacy?: 1 - Never    Interpreter Needed?: No      Activities of Daily Living    11/24/2021   11:20 AM  In your present state of health, do you have any difficulty performing the following activities:  Hearing? 0  Vision? 0  Difficulty concentrating or making decisions? 0  Walking or climbing stairs? 0  Dressing or bathing? 0  Doing errands, shopping? 0   Preparing Food and eating ? N  Using the Toilet? N  In the past six months, have you accidently leaked urine? N  Do you have problems with loss of bowel control? N  Managing your Medications? N  Managing your Finances? N  Housekeeping or managing your Housekeeping? N    Patient Care Team: Einar Pheasant, MD as PCP - General (Internal Medicine) Christene Lye, MD (General Surgery) Einar Pheasant, MD (Internal Medicine)  Indicate any recent Medical Services you may have received from other than [facility] providers in the past year (date may be approximate).     Assessment:   This is a routine wellness examination for Lindsey Bentley.  Virtual Visit via Telephone Note  I connected with  Lindsey Bentley on 11/24/21 at 11:00 AM EDT by telephone and verified that I am speaking with the correct person using two identifiers.  Persons participating in the virtual visit: patient/Nurse Health Advisor   I discussed the limitations of performing an evaluation and management service by telehealth.  We continued and completed visit with audio only. Some vital signs may be absent or patient reported.   Hearing/Vision screen Hearing Screening - Comments:: Patient is able to hear conversational tones without difficulty.  No issues reported. Vision Screening - Comments:: Followed by [facility]  Wears corrective lenses   Dietary issues and exercise activities discussed: Current Exercise Habits: Home exercise routine, Frequency (Times/Week): 7, Intensity: Mild   Goals Addressed               This Visit's Progress     Patient Stated     DIET -  REDUCE PORTION SIZE (pt-stated)   On track     Healthy meals Add more waist/core exercises        Depression Screen    11/20/2020    1:27 PM 10/28/2020    3:21 PM 11/20/2019    1:41 PM 11/16/2018    1:50 PM 08/25/2017   11:11 AM 05/21/2016   11:11 AM 04/07/2016   10:55 AM  PHQ 2/9 Scores  PHQ - 2 Score 0 0 0 0 0 0 0    Fall Risk     11/24/2021   11:19 AM 10/30/2021    7:56 AM 02/06/2021    3:42 PM 11/20/2019    1:41 PM 01/31/2019    2:29 PM  [place] in the past year? 0 0 0 0 0  Number falls in past yr: 0 0 0 0   Injury with Fall?  0 0    Risk for fall due to :  No Fall Risks No Fall Risks    Follow up Falls evaluation completed Falls evaluation completed Falls evaluation completed Falls evaluation completed Falls evaluation completed    [name]: Home free of loose throw rugs in walkways, pet beds, electrical cords, etc? Yes  Adequate lighting in your home to reduce risk of falls? Yes   ASSISTIVE DEVICES UTILIZED TO PREVENT FALLS: Life alert? No  Use of a cane, walker or w/c? No   TIMED UP AND GO: Was the test performed? No .   Cognitive Function: Patient is alert and oriented x3.      02/12/2016   11:37 AM  MMSE - Mini Mental State Exam  Orientation to time 5  Orientation to Place 5  Registration 3  Attention/ Calculation 5  Recall 2  Language- name 2 objects 2  Language- repeat 1  Language- follow 3 step command 3  Language- read & follow direction 1  Write a sentence 1  Copy design 1  Total score 29        11/20/2020    1:49 PM 11/20/2019    1:50 PM 11/16/2018    1:51 PM  6CIT Screen  What Year? 0 points 0 points 0 points  What month? 0 points 0 points 0 points  What time? 0 points  0 points  Count back from 20 0 points  0 points  Months in reverse 0 points 0 points 0 points  Repeat phrase  2 points 0 points  Total Score   0 points    Immunizations Immunization History  Administered Date(s) Administered   Pneumococcal Conjugate-13 12/17/2016   Pneumococcal Polysaccharide-23 07/26/2012   Td 05/11/2004   Screening Tests Health Maintenance  Topic Date Due   Zoster Vaccines- Shingrix (1 of 2) 01/30/2022 (Originally 08/14/1964)   OPHTHALMOLOGY EXAM  02/06/2022 (Originally 09/07/2021)   COLONOSCOPY (Pts 45-52yrs Insurance coverage will need to  be confirmed)  10/31/2022 (Originally 05/17/2020)   TETANUS/TDAP  11/25/2022 (Originally 05/11/2014)   INFLUENZA VACCINE  12/09/2021   MAMMOGRAM  12/18/2021   HEMOGLOBIN A1C  04/24/2022   FOOT EXAM  07/29/2022   Pneumonia Vaccine 78+ Years old  Completed   DEXA SCAN  Completed   Hepatitis C Screening  Completed   HPV VACCINES  Aged Out   COVID-19 Vaccine  Discontinued   Health Maintenance There are no preventive care reminders to display for this patient.  Lung Cancer Screening: (Low Dose CT Chest recommended if Age 14-80 years, 30 pack-year currently smoking  OR have quit w/in 15years.) does not qualify.   Vision Screening: Recommended annual ophthalmology exams for early detection of glaucoma and other disorders of the eye.  Dental Screening: Recommended annual dental exams for proper oral hygiene  Community Resource Referral / Chronic Care Management: CRR required this visit?  No   CCM required this visit?  No      Plan:   Keep all routine maintenance appointments.   I have personally reviewed and noted the following in the patient's chart:   Medical and social history Use of alcohol, tobacco or illicit drugs  Current medications and supplements including opioid prescriptions.  Functional ability and status Nutritional status Physical activity Advanced directives List of other physicians Hospitalizations, surgeries, and ER visits in previous 12 months Vitals Screenings to include cognitive, depression, and falls Referrals and appointments  In addition, I have reviewed and discussed with patient certain preventive protocols, quality metrics, and best practice recommendations. A written personalized care plan for preventive services as well as general preventive health recommendations were provided to patient.     Varney Biles, LPN   1/[id number]/3729       Agree with plan. Mable Paris, NP

## 2021-11-24 NOTE — Patient Instructions (Addendum)
  Ms. Pacey , Thank you for taking time to come for your Medicare Wellness Visit. I appreciate your ongoing commitment to your health goals. Please review the following plan we discussed and let me know if I can assist you in the future.   These are the goals we discussed:  Goals       Patient Stated     DIET - REDUCE PORTION SIZE (pt-stated)      Healthy meals Add more waist/core exercises         This is a list of the screening recommended for you and due dates:  Health Maintenance  Topic Date Due   Zoster (Shingles) Vaccine (1 of 2) 01/30/2022*   Eye exam for diabetics  02/06/2022*   Colon Cancer Screening  10/31/2022*   Tetanus Vaccine  11/25/2022*   Flu Shot  12/09/2021   Mammogram  12/18/2021   Hemoglobin A1C  04/24/2022   Complete foot exam   07/29/2022   Pneumonia Vaccine  Completed   DEXA scan (bone density measurement)  Completed   Hepatitis C Screening: USPSTF Recommendation to screen - Ages 43-79 yo.  Completed   HPV Vaccine  Aged Out   COVID-19 Vaccine  Discontinued  *Topic was postponed. The date shown is not the original due date.

## 2021-11-25 ENCOUNTER — Other Ambulatory Visit: Payer: Self-pay | Admitting: Internal Medicine

## 2021-11-27 ENCOUNTER — Ambulatory Visit: Payer: Medicare Other | Admitting: Internal Medicine

## 2021-12-19 ENCOUNTER — Encounter: Payer: Self-pay | Admitting: Internal Medicine

## 2021-12-19 ENCOUNTER — Ambulatory Visit (INDEPENDENT_AMBULATORY_CARE_PROVIDER_SITE_OTHER): Payer: Medicare Other | Admitting: Internal Medicine

## 2021-12-19 VITALS — BP 128/60 | HR 70 | Temp 98.1°F | Resp 16 | Ht 65.0 in | Wt 190.8 lb

## 2021-12-19 DIAGNOSIS — Z86010 Personal history of colonic polyps: Secondary | ICD-10-CM | POA: Diagnosis not present

## 2021-12-19 DIAGNOSIS — R7989 Other specified abnormal findings of blood chemistry: Secondary | ICD-10-CM | POA: Diagnosis not present

## 2021-12-19 DIAGNOSIS — M545 Low back pain, unspecified: Secondary | ICD-10-CM | POA: Diagnosis not present

## 2021-12-19 DIAGNOSIS — I25119 Atherosclerotic heart disease of native coronary artery with unspecified angina pectoris: Secondary | ICD-10-CM | POA: Diagnosis not present

## 2021-12-19 DIAGNOSIS — E1159 Type 2 diabetes mellitus with other circulatory complications: Secondary | ICD-10-CM

## 2021-12-19 DIAGNOSIS — K219 Gastro-esophageal reflux disease without esophagitis: Secondary | ICD-10-CM

## 2021-12-19 DIAGNOSIS — R519 Headache, unspecified: Secondary | ICD-10-CM | POA: Diagnosis not present

## 2021-12-19 DIAGNOSIS — I10 Essential (primary) hypertension: Secondary | ICD-10-CM

## 2021-12-19 DIAGNOSIS — J449 Chronic obstructive pulmonary disease, unspecified: Secondary | ICD-10-CM

## 2021-12-19 DIAGNOSIS — F439 Reaction to severe stress, unspecified: Secondary | ICD-10-CM

## 2021-12-19 DIAGNOSIS — Z853 Personal history of malignant neoplasm of breast: Secondary | ICD-10-CM

## 2021-12-19 DIAGNOSIS — E7800 Pure hypercholesterolemia, unspecified: Secondary | ICD-10-CM

## 2021-12-19 DIAGNOSIS — Z1231 Encounter for screening mammogram for malignant neoplasm of breast: Secondary | ICD-10-CM

## 2021-12-19 DIAGNOSIS — E041 Nontoxic single thyroid nodule: Secondary | ICD-10-CM

## 2021-12-19 DIAGNOSIS — N1831 Chronic kidney disease, stage 3a: Secondary | ICD-10-CM | POA: Diagnosis not present

## 2021-12-19 NOTE — Progress Notes (Signed)
[name], female   DOB: 12/07/1945, 76 y.o.   MRN: [id number]   Subjective:    [name], female    DOB: January 24, 1946, 76 y.o.   MRN: [id number]   Patient here for scheduled follow up. [name]   HPI Was having issues with increased back pain.  She is taking care of her husband who has alzheimers.  Was having to bend a lot, etc.  Hospice is now involved with her husband.  Now that she has help, her back is doing better.  Regarding her back, she was having low back pain that moved into her right groin.  Noticed some white specks in her urine.  Discussed possible kidney stone.  No pain now.  No hematuria.  Discussed scan.  She did fall recently.  Tripped over a brick outside.  Fell left side.  No residual problems.  No chest pain.  Breathing stable.  Blood pressure has been doing well - 071Q systolic readings.  No abdominal pain.  Bowels moving.     Past Medical History:  Diagnosis Date   Arthritis    Breast cancer ([place])    Breast cancer, left ([place]) 2016   LT LUMPECTOMY - TI, NO, MO - IDC, ER/PR pos, Her 2 neg, Rad tx's.    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   CHF (congestive heart failure) (HCC)    COPD (chronic obstructive pulmonary disease) (HCC)    Coronary artery disease    Depression    Diffuse cystic mastopathy 2013   DVT (deep venous thrombosis) (HCC)    Dyspnea    Elevated TSH    Family history of adverse reaction to anesthesia    Pt stated that son had a seizure with a combination of anesthesia and pain medication."   Fatty liver    GERD (gastroesophageal reflux disease)    Glaucoma    High cholesterol    History of chicken pox    Hyperglycemia    Hyperlipidemia    Hypertension    LVH (left ventricular hypertrophy)    PAC (premature atrial contraction)    Palpitations    Peripheral vascular disease ([place])    Personal history of radiation therapy 2016   BREAST  CA   Pneumonia March 2014   Skin cancer 2013   Sleep apnea    wears CPAP set at 2.5   Wears glasses    Past Surgical History:  Procedure Laterality Date   BACK SURGERY  1986   ruptured disc   BREAST BIOPSY Left 2008   NEG   BREAST BIOPSY Left 08-20-14   POS   BREAST BIOPSY Right 2007   NEG   BREAST EXCISIONAL BIOPSY Left 1984   NEG   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 HER2 negative.  Wide excision with SLN biopsy.  MammoSite radiation   CARPAL TUNNEL RELEASE     CHOLECYSTECTOMY  2006   COLONOSCOPY  2010   Dr. Jamal Collin   COLONOSCOPY WITH PROPOFOL N/A 05/17/2017   Procedure: COLONOSCOPY WITH PROPOFOL;  Surgeon: Manya Silvas, MD;  Location[facility] ENDOSCOPY;  Service: Endoscopy;  Laterality: N/A;   DILATION AND CURETTAGE OF UTERUS     ESOPHAGOGASTRODUODENOSCOPY (EGD) WITH PROPOFOL N/A 05/17/2017   Procedure: ESOPHAGOGASTRODUODENOSCOPY (EGD) WITH PROPOFOL;  Surgeon: Manya Silvas, MD;  Location: [facility] ENDOSCOPY;  Service: Endoscopy;  Laterality: N/A;   LAPAROTOMY FOR REMOVAL TUMOR LUMBAR PLEXES     Leg stent  2011   MOLE REMOVAL  2013   15 removed   POSTERIOR CERVICAL LAMINECTOMY Left 10/15/2015   Procedure: Left Cervical four- five Hemilaminectomy/Remove mass;  Surgeon: Leeroy Cha, MD;  Location: [facility]URO ORS;  Service: Neurosurgery;  Laterality: Left;  Left C4-5 Hemilaminectomy/Remove mass   Family History  Problem Relation Age of Onset   Cancer Father        Prostate   Diabetes Father    Cerebrovascular Accident Father    Hypertension Mother    AAA (abdominal aortic aneurysm) Mother    Hyperlipidemia Other        Parent   Miscarriages / Stillbirths Other        Parent   Hypertension Other        parent   Heart disease Other        Parent   Breast cancer Maternal Aunt 72   Social History   Socioeconomic History   Marital status: Married    Spouse name: Not on file   Number of children: 3    Years of education: 12   Highest education level: Not on file  Occupational History   Occupation: Caregiver   Tobacco Use   Smoking status: Never   Smokeless tobacco: Never  Substance and Sexual Activity   Alcohol use: No    Alcohol/week: 0.0 standard drinks of alcohol   Drug use: No   Sexual activity: Never  Other Topics Concern   Not on file  Social History Narrative   Regular exercise-mo   Caffeine Use-no   Social Determinants of Health   Financial Resource Strain: Medium Risk (07/14/2021)   Overall Financial Resource Strain (CARDIA)    Difficulty of Paying Living Expenses: Somewhat hard  Food Insecurity: No Food Insecurity (11/24/2021)   Hunger Vital Sign    Worried About Running Out of Food in the Last Year: Never true    Ran Out of Food in the Last Year: Never true  Transportation Needs: No Transportation Needs (11/24/2021)   PRAPARE - Hydrologist (Medical): No    Lack of Transportation (Non-Medical): No  Physical Activity: Sufficiently Active (11/24/2021)   Exercise Vital Sign    Days of Exercise per Week: 7 days    Minutes of Exercise per Session: 30 min  Stress: No Stress Concern Present (11/24/2021)   [place]    Feeling of Stress : Only a little  Social Connections: Socially Integrated (11/24/2021)   Social Connection and Isolation Panel [NHANES]    Frequency of Communication with Friends and Family: More than three times a week    Frequency of Social Gatherings with Friends and Family: More than three times a week    Attends Religious Services: More than 4 times per year    Active Member of [employer] or Organizations: Yes    Attends Music therapist: More than 4 times per year    Marital Status: Married     Review of Systems  Constitutional:  Negative for appetite change and unexpected weight change.  HENT:  Negative for congestion and sinus pressure.    Respiratory:  Negative for cough, chest tightness and shortness of breath.   Cardiovascular:  Negative for chest pain and palpitations.       No increased swelling.   Gastrointestinal:  Negative for abdominal pain, diarrhea, nausea and vomiting.  Genitourinary:  Negative for difficulty urinating and dysuria.  Musculoskeletal:  Negative for joint swelling and myalgias.  Skin:  Negative for color change and rash.  Neurological:  Negative for dizziness and headaches.  Psychiatric/Behavioral:  Negative for agitation and dysphoric mood.        Increased stress as outlined.  Better now that hospice is involved.        Objective:     BP 128/60 (BP Location: Left Arm, Patient Position: Sitting, Cuff Size: Large)   Pulse 70   Temp 98.1 F (36.7 C) (Temporal)   Resp 16   Ht $R'5\' 5"'aQ$  (1.651 m)   Wt 190 lb 12.8 oz (86.5 kg)   SpO2 99%   BMI 31.75 kg/m  Wt Readings from Last 3 Encounters:  12/19/21 190 lb 12.8 oz (86.5 kg)  11/24/21 193 lb (87.5 kg)  10/30/21 193 lb (87.5 kg)    Physical Exam Vitals reviewed.  Constitutional:      General: She is not in acute distress.    Appearance: Normal appearance.  HENT:     Head: Normocephalic and atraumatic.     Right Ear: External ear normal.     Left Ear: External ear normal.  Eyes:     General: No scleral icterus.       Right eye: No discharge.        Left eye: No discharge.     Conjunctiva/sclera: Conjunctivae normal.  Neck:     Thyroid: No thyromegaly.  Cardiovascular:     Rate and Rhythm: Normal rate and regular rhythm.  Pulmonary:     Effort: No respiratory distress.     Breath sounds: Normal breath sounds. No wheezing.  Abdominal:     General: Bowel sounds are normal.     Palpations: Abdomen is soft.     Tenderness: There is no abdominal tenderness.  Musculoskeletal:        General: No swelling or tenderness.     Cervical back: Neck supple. No tenderness.  Lymphadenopathy:     Cervical: No cervical adenopathy.  Skin:     Findings: No erythema or rash.  Neurological:     Mental Status: She is alert.  Psychiatric:        Mood and Affect: Mood normal.        Behavior: Behavior normal.      Outpatient Encounter Medications as of 12/19/2021  Medication Sig   acetaminophen (TYLENOL) 650 MG CR tablet Take 650 mg by mouth as needed for pain.   aspirin 325 MG tablet Take 325 mg by mouth daily.   carvedilol (COREG) 12.5 MG tablet TAKE 1 TABLET(12.5 MG) BY MOUTH TWICE DAILY WITH MEALS   Cholecalciferol 25 MCG (1000 UT) tablet Take 1,000 Units by mouth daily.    empagliflozin (JARDIANCE) 25 MG TABS tablet Take 1 tablet (25 mg total) by mouth daily before breakfast.   glucose blood (ONETOUCH VERIO) test strip Use to check blood sugars up to three times daily.   hydrALAZINE (APRESOLINE) 10 MG tablet TAKE 1 TABLET(10 MG) BY MOUTH THREE TIMES DAILY   losartan-hydrochlorothiazide (HYZAAR) 100-25 MG tablet TAKE 1 TABLET BY MOUTH DAILY   meclizine (ANTIVERT) 25 MG tablet Take 1 tablet (25 mg total) by mouth daily as needed for dizziness.   omeprazole (PRILOSEC) 40 MG capsule TAKE 1 CAPSULE(40 MG) BY MOUTH DAILY   OneTouch Delica Lancets 26S MISC Use to check blood sugars up to 3 times daily.  rosuvastatin (CRESTOR) 40 MG tablet TAKE 1 TABLET(40 MG) BY MOUTH DAILY   vitamin E 400 UNIT capsule Take 400 Units by mouth daily.   No facility-administered encounter medications on file as of 12/19/2021.     Lab Results  Component Value Date   WBC 5.8 02/04/2021   HGB 13.0 02/04/2021   HCT 37.7 02/04/2021   PLT 168.0 02/04/2021   GLUCOSE 128 (H) 10/30/2021   CHOL 136 10/23/2021   TRIG 223.0 (H) 10/23/2021   HDL 38.50 (L) 10/23/2021   LDLDIRECT 70.0 10/23/2021   LDLCALC 64 04/15/2017   ALT 15 10/23/2021   AST 14 10/23/2021   NA 138 10/30/2021   K 4.3 10/30/2021   CL 102 10/30/2021   CREATININE 1.21 (H) 10/30/2021   BUN 20 10/30/2021   CO2 26 10/30/2021   TSH 4.44 07/25/2021   INR 0.9 07/13/2012   HGBA1C 7.0 (H)  10/23/2021   MICROALBUR 1.0 10/30/2021    MM 3D SCREEN BREAST BILATERAL  Result Date: 12/18/2020 CLINICAL DATA:  Screening. EXAM: DIGITAL SCREENING BILATERAL MAMMOGRAM WITH TOMOSYNTHESIS AND CAD TECHNIQUE: Bilateral screening digital craniocaudal and mediolateral oblique mammograms were obtained. Bilateral screening digital breast tomosynthesis was performed. The images were evaluated with computer-aided detection. COMPARISON:  Previous exam(s). ACR Breast Density Category c: The breast tissue is heterogeneously dense, which may obscure small masses. FINDINGS: There are no findings suspicious for malignancy. IMPRESSION: No mammographic evidence of malignancy. A result letter of this screening mammogram will be mailed directly to the patient. RECOMMENDATION: Screening mammogram in one year. (Code:SM-B-01Y) BI-RADS CATEGORY  1: Negative. Electronically Signed   By: Ammie Ferrier M.D.   On: 12/18/2020 14:54  DG Bone Density  Result Date: 12/18/2020 EXAM: DUAL X-RAY ABSORPTIOMETRY (DXA) FOR BONE MINERAL DENSITY IMPRESSION: Dear Dr. Nicki Reaper, Your patient BRENDA SAMANO completed a FRAX assessment on 12/18/2020 using the [place] (analysis version: 14.10) manufactured by [employer]. The following summarizes the results of our evaluation. PATIENT BIOGRAPHICAL: Name: Brihanna, [place] Patient ID: [id number] Birth Date: 1945-08-05 Height:    64.0 in. Gender:     Female    [age]       Weight:    195.6 lbs. Ethnicity:  White                            Exam Date: 12/18/2020 FRAX* RESULTS:  (version: 3.5) 10-year Probability of Fracture1 Major Osteoporotic Fracture2 Hip Fracture 17.8% 3.7% Population: [place] (Caucasian) Risk Factors: History of Fracture (Adult) Based on Femur (Right) Neck BMD 1 -The 10-year probability of fracture may be lower than reported if the patient has received treatment. 2 -Major Osteoporotic Fracture: Clinical Spine, Forearm, Hip or Shoulder *FRAX is a Materials engineer of the  [employer] for Metabolic Bone Disease, a [place] (WHO) [facility]. ASSESSMENT: The probability of a major osteoporotic fracture is 17.8% within the next ten years. The probability of a hip fracture is 3.7% within the next ten years. . Your patient Annslee Tercero completed a BMD test on 12/18/2020 using the [place] (software version: 14.10) manufactured by [employer]. The following summarizes the results of our evaluation. Technologist: [facility] PATIENT BIOGRAPHICAL: Name: Nickie, [place] Patient ID: [id number] Birth Date: 12/26/1945 Height: 64.0 in. Gender: Female Exam Date: 12/18/2020 Weight: 195.6 lbs. Indications: Advanced Age, Caucasian, Diabetic, Early Menopause, Height Loss, History of  Breast Cancer, History of Fracture (Adult), History of Radiation, Postmenopausal Fractures: Left finger, Left wrist Treatments: aspirin, Jardiance, Omeprazole, Vitamin D DENSITOMETRY RESULTS: Site          Region     Measured Date Measured Age WHO Classification Young Adult T-score BMD         %Change vs. Previous Significant Change (*) DualFemur Neck Right 12/18/2020 76.3 Osteopenia -1.7 0.801 g/cm2 -0.7% - DualFemur Neck Right 10/05/2017 73.1 Osteopenia -1.7 0.807 g/cm2 -1.3% - DualFemur Neck Right 02/01/2014 69.4 Osteopenia -1.6 0.818 g/cm2 - - DualFemur Total Mean 12/18/2020 76.3 Osteopenia -1.1 0.874 g/cm2 0.2% - DualFemur Total Mean 10/05/2017 73.1 Osteopenia -1.1 0.872 g/cm2 0.5% - DualFemur Total Mean 02/01/2014 69.4 Osteopenia -1.1 0.868 g/cm2 - - Right Forearm Radius 33% 12/18/2020 76.3 Normal -0.8 0.806 g/cm2 -8.1% Yes Right Forearm Radius 33% 02/01/2014 69.4 Normal 0.0 0.877 g/cm2 - - ASSESSMENT: The BMD measured at Femur Neck Right is 0.801 g/cm2 with a T-score of -1.7. This patient is considered osteopenic according to [facility]) criteria. The scan quality is good. Lumbar spine was not utilized due to  advanced degenerative changes. Compared with prior study, there has been no significant change in the total hip. World Pharmacologist [facility]) criteria for post-menopausal, Caucasian Women: Normal:                   T-score at or above -1 SD Osteopenia/low bone mass: T-score between -1 and -2.5 SD Osteoporosis:             T-score at or below -2.5 SD RECOMMENDATIONS: 1. All patients should optimize calcium and vitamin D intake. 2. Consider FDA-approved medical therapies in postmenopausal women and men aged 37 years and older, based on the following: a. A hip or vertebral(clinical or morphometric) fracture b. T-score < -2.5 at the femoral neck or spine after appropriate evaluation to exclude secondary causes c. Low bone mass (T-score between -1.0 and -2.5 at the femoral neck or spine) and a 10-year probability of a hip fracture > 3% or a 10-year probability of a major osteoporosis-related fracture > 20% based on the [place]-adapted WHO algorithm 3. Clinician judgment and/or patient preferences may indicate treatment for people with 10-year fracture probabilities above or below these levels FOLLOW-UP: People with diagnosed cases of osteoporosis or at high risk for fracture should have regular bone mineral density tests. For patients eligible for Medicare, routine testing is allowed once every 2 years. The testing frequency can be increased to one year for patients who have rapidly progressing disease, those who are receiving or discontinuing medical therapy to restore bone mass, or have additional risk factors. I have reviewed this report, and agree with the above findings. Mark A. Thornton Papas, M.D. [facility] Electronically Signed   By: Lavonia Dana M.D.   On: 12/18/2020 14:51       Assessment & Plan:   Problem List Items Addressed This Visit     Abnormal liver function test    Fatty liver found on previous CT.  Diet and exercise.  Follow liver function tests. Recent check wnl.       Relevant Orders    Hepatic function panel   Back pain    Better.  Hospice is now involved with her husband.  This is helping.  Recent notice of white particles in her urine.  Resolved now.  Discussed possible kidney stone.  No pain now.  Discussed scan. Wants to monitor.  CAD (coronary artery disease)    Followed by cardiology.  No pain.  Continue risk factor modification.  Continue crestor.  Followed by cardiology.         CKD (chronic kidney disease), stage III (HCC)    Avoid anti-inflammatories..  Stay hydrated.  Last GFR 43. Urine - no protein.  Follow metabolic panel. Continue ARB      COPD (chronic obstructive pulmonary disease) (HCC)    Breathing overall stable.  No sob.  Follow.       Diabetes mellitus with cardiac complication (HCC)    On jardiance.  a1c 7.0.  Low carb diet and exercise.  Have discussed additional medication.  She has wanted to hold on further medication.  Follow met b and a1c. Eye exam scheduled for 01/2022.       Relevant Orders   Basic metabolic panel   Hemoglobin A1c   GERD (gastroesophageal reflux disease)    No upper symptom reported.  On prilosec.       Headache    Not an issue now.  Follow.  Blood pressure as outlined.       History of breast cancer    Completed femara.  Mammogram 12/2020 - Birads I.  Mammogram has been ordered.  Need to schedule.       History of colon polyps    Last colonoscopy January 2019 (Dr. Vira Agar).  Per patient recommended follow-up in 3 years.  Overdue follow-up. Have discussed.  Declines.       Hypercholesterolemia    On Crestor.  Low-cholesterol diet and exercise. Discussed elevated triglycerides.  LDL 70.  Follow-up lipid panel liver function test.      Relevant Orders   CBC with Differential/Platelet   Lipid panel   Hypertension    Continue losartan/hctz, coreg and hydralazine.  Follow pressures.  Follow metabolic panel.  Outside readings as outlined.       Stress    Increased stress.  Discussed.  Is better now  that hospice is involved. Will notify me if feels needs any further intervention.  Follow.       Thyroid nodule    Evaluated by Dr Gabriel Carina.  Biopsy negative.  Follow tsh.        Other Visit Diagnoses     Encounter for screening mammogram for malignant neoplasm of breast    -  Primary   Relevant Orders   MM 3D SCREEN BREAST BILATERAL        Einar Pheasant, MD

## 2021-12-29 ENCOUNTER — Encounter: Payer: Self-pay | Admitting: Internal Medicine

## 2021-12-29 NOTE — Assessment & Plan Note (Signed)
Fatty liver found on previous CT.  Diet and exercise.  Follow liver function tests. Recent check wnl.

## 2021-12-29 NOTE — Assessment & Plan Note (Signed)
Last colonoscopy January 2019 (Dr. Elliott).  Per patient recommended follow-up in 3 years.  Overdue follow-up. Have discussed.  Declines.  

## 2021-12-29 NOTE — Assessment & Plan Note (Addendum)
Continue losartan/hctz, coreg and hydralazine.  Follow pressures.  Follow metabolic panel.  Outside readings as outlined.

## 2021-12-29 NOTE — Assessment & Plan Note (Signed)
Avoid anti-inflammatories..  Stay hydrated.  Last GFR 43. Urine - no protein.  Follow metabolic panel. Continue ARB

## 2021-12-29 NOTE — Assessment & Plan Note (Signed)
Not an issue now.  Follow.  Blood pressure as outlined.

## 2021-12-29 NOTE — Assessment & Plan Note (Signed)
Completed femara.  Mammogram 12/2020 - Birads I.  Mammogram has been ordered.  Need to schedule.  

## 2021-12-29 NOTE — Assessment & Plan Note (Signed)
Better.  Hospice is now involved with her husband.  This is helping.  Recent notice of white particles in her urine.  Resolved now.  Discussed possible kidney stone.  No pain now.  Discussed scan. Wants to monitor.

## 2021-12-29 NOTE — Assessment & Plan Note (Signed)
Increased stress.  Discussed.  Is better now that hospice is involved. Will notify me if feels needs any further intervention.  Follow.

## 2021-12-29 NOTE — Assessment & Plan Note (Signed)
Breathing overall stable.  No sob.  Follow.  

## 2021-12-29 NOTE — Assessment & Plan Note (Signed)
Followed by cardiology.  No pain.  Continue risk factor modification.  Continue crestor.  Followed by cardiology.

## 2021-12-29 NOTE — Assessment & Plan Note (Addendum)
On jardiance.  a1c 7.0.  Low carb diet and exercise.  Have discussed additional medication.  She has wanted to hold on further medication.  Follow met b and a1c. Eye exam scheduled for 01/2022.

## 2021-12-29 NOTE — Assessment & Plan Note (Signed)
No upper symptom reported.  On prilosec.  

## 2021-12-29 NOTE — Assessment & Plan Note (Signed)
Evaluated by Dr Gabriel Carina.  Biopsy negative.  Follow tsh.

## 2021-12-29 NOTE — Assessment & Plan Note (Signed)
On Crestor.  Low-cholesterol diet and exercise. Discussed elevated triglycerides.  LDL 70.  Follow-up lipid panel liver function test.

## 2021-12-30 ENCOUNTER — Ambulatory Visit: Payer: Medicare Other | Admitting: Internal Medicine

## 2022-01-09 DIAGNOSIS — G4733 Obstructive sleep apnea (adult) (pediatric): Secondary | ICD-10-CM | POA: Diagnosis not present

## 2022-02-19 ENCOUNTER — Other Ambulatory Visit (INDEPENDENT_AMBULATORY_CARE_PROVIDER_SITE_OTHER): Payer: Medicare Other

## 2022-02-19 DIAGNOSIS — E7800 Pure hypercholesterolemia, unspecified: Secondary | ICD-10-CM | POA: Diagnosis not present

## 2022-02-19 DIAGNOSIS — R7989 Other specified abnormal findings of blood chemistry: Secondary | ICD-10-CM

## 2022-02-19 DIAGNOSIS — E1159 Type 2 diabetes mellitus with other circulatory complications: Secondary | ICD-10-CM

## 2022-02-19 LAB — CBC WITH DIFFERENTIAL/PLATELET
Basophils Absolute: 0 10*3/uL (ref 0.0–0.1)
Basophils Relative: 0.6 % (ref 0.0–3.0)
Eosinophils Absolute: 0.2 10*3/uL (ref 0.0–0.7)
Eosinophils Relative: 2.9 % (ref 0.0–5.0)
HCT: 37.7 % (ref 36.0–46.0)
Hemoglobin: 12.8 g/dL (ref 12.0–15.0)
Lymphocytes Relative: 32.8 % (ref 12.0–46.0)
Lymphs Abs: 2 10*3/uL (ref 0.7–4.0)
MCHC: 34 g/dL (ref 30.0–36.0)
MCV: 92.5 fl (ref 78.0–100.0)
Monocytes Absolute: 0.5 10*3/uL (ref 0.1–1.0)
Monocytes Relative: 8.7 % (ref 3.0–12.0)
Neutro Abs: 3.4 10*3/uL (ref 1.4–7.7)
Neutrophils Relative %: 55 % (ref 43.0–77.0)
Platelets: 172 10*3/uL (ref 150.0–400.0)
RBC: 4.08 Mil/uL (ref 3.87–5.11)
RDW: 13.7 % (ref 11.5–15.5)
WBC: 6.1 10*3/uL (ref 4.0–10.5)

## 2022-02-19 LAB — HEPATIC FUNCTION PANEL
ALT: 14 U/L (ref 0–35)
AST: 12 U/L (ref 0–37)
Albumin: 4.2 g/dL (ref 3.5–5.2)
Alkaline Phosphatase: 54 U/L (ref 39–117)
Bilirubin, Direct: 0.1 mg/dL (ref 0.0–0.3)
Total Bilirubin: 0.5 mg/dL (ref 0.2–1.2)
Total Protein: 7 g/dL (ref 6.0–8.3)

## 2022-02-19 LAB — HEMOGLOBIN A1C: Hgb A1c MFr Bld: 7 % — ABNORMAL HIGH (ref 4.6–6.5)

## 2022-02-19 LAB — LDL CHOLESTEROL, DIRECT: Direct LDL: 71 mg/dL

## 2022-02-19 LAB — BASIC METABOLIC PANEL
BUN: 24 mg/dL — ABNORMAL HIGH (ref 6–23)
CO2: 27 mEq/L (ref 19–32)
Calcium: 9.3 mg/dL (ref 8.4–10.5)
Chloride: 104 mEq/L (ref 96–112)
Creatinine, Ser: 1.28 mg/dL — ABNORMAL HIGH (ref 0.40–1.20)
GFR: 40.41 mL/min — ABNORMAL LOW (ref >60.00)
Glucose, Bld: 110 mg/dL — ABNORMAL HIGH (ref 70–99)
Potassium: 4.3 mEq/L (ref 3.5–5.1)
Sodium: 142 mEq/L (ref 135–145)

## 2022-02-19 LAB — LIPID PANEL
Cholesterol: 144 mg/dL (ref 0–200)
HDL: 38.6 mg/dL — ABNORMAL LOW (ref >39.00)
NonHDL: 105.61
Total CHOL/HDL Ratio: 4
Triglycerides: 230 mg/dL — ABNORMAL HIGH (ref 0.0–149.0)
VLDL: 46 mg/dL — ABNORMAL HIGH (ref 0.0–40.0)

## 2022-02-21 ENCOUNTER — Telehealth: Payer: Self-pay | Admitting: Internal Medicine

## 2022-02-21 NOTE — Telephone Encounter (Signed)
-----   Message from Jason Fila, CPhT sent at 02/20/2022  4:29 PM EDT ----- Regarding: upcoming appointment request please Hi Dr. Nicki Reaper,  Patient has an appointment with you on Oct 17 and will be bringing the [facility] re enrollment application for Jardiance to your office.  Would you please mind filling out the provider part and then have someone fax me all the documents she brings in with her to me so that I can re submit her BI application for [id number] to the company so we can potentially get her approved again for 2024.  Thanks, [name]. [place], [place]  [phone]  [phone] number

## 2022-02-21 NOTE — Telephone Encounter (Signed)
Hold for appt - to sign.

## 2022-02-23 ENCOUNTER — Other Ambulatory Visit: Payer: Self-pay | Admitting: Internal Medicine

## 2022-02-24 ENCOUNTER — Encounter: Payer: Self-pay | Admitting: Internal Medicine

## 2022-02-24 ENCOUNTER — Ambulatory Visit (INDEPENDENT_AMBULATORY_CARE_PROVIDER_SITE_OTHER): Payer: Medicare Other | Admitting: Internal Medicine

## 2022-02-24 DIAGNOSIS — E1159 Type 2 diabetes mellitus with other circulatory complications: Secondary | ICD-10-CM

## 2022-02-24 DIAGNOSIS — E041 Nontoxic single thyroid nodule: Secondary | ICD-10-CM | POA: Diagnosis not present

## 2022-02-24 DIAGNOSIS — J449 Chronic obstructive pulmonary disease, unspecified: Secondary | ICD-10-CM | POA: Diagnosis not present

## 2022-02-24 DIAGNOSIS — H6123 Impacted cerumen, bilateral: Secondary | ICD-10-CM | POA: Diagnosis not present

## 2022-02-24 DIAGNOSIS — Z853 Personal history of malignant neoplasm of breast: Secondary | ICD-10-CM

## 2022-02-24 DIAGNOSIS — H6063 Unspecified chronic otitis externa, bilateral: Secondary | ICD-10-CM | POA: Diagnosis not present

## 2022-02-24 DIAGNOSIS — I739 Peripheral vascular disease, unspecified: Secondary | ICD-10-CM

## 2022-02-24 DIAGNOSIS — N1831 Chronic kidney disease, stage 3a: Secondary | ICD-10-CM

## 2022-02-24 DIAGNOSIS — I25119 Atherosclerotic heart disease of native coronary artery with unspecified angina pectoris: Secondary | ICD-10-CM | POA: Diagnosis not present

## 2022-02-24 DIAGNOSIS — I10 Essential (primary) hypertension: Secondary | ICD-10-CM | POA: Diagnosis not present

## 2022-02-24 DIAGNOSIS — Z860100 Personal history of colon polyps, unspecified: Secondary | ICD-10-CM

## 2022-02-24 DIAGNOSIS — I779 Disorder of arteries and arterioles, unspecified: Secondary | ICD-10-CM

## 2022-02-24 DIAGNOSIS — F439 Reaction to severe stress, unspecified: Secondary | ICD-10-CM

## 2022-02-24 DIAGNOSIS — K219 Gastro-esophageal reflux disease without esophagitis: Secondary | ICD-10-CM | POA: Diagnosis not present

## 2022-02-24 DIAGNOSIS — E7800 Pure hypercholesterolemia, unspecified: Secondary | ICD-10-CM

## 2022-02-24 NOTE — Progress Notes (Signed)
[name], female   DOB: 1946/02/21, 76 y.o.   MRN: [id number]   Subjective:    [name], female    DOB: 1945/10/30, 76 y.o.   MRN: [id number]    Patient here for  Chief Complaint  Patient presents with   Follow-up    2 month f/u   .   HPI Here to follow up regarding increased stress, blood pressure and cholesterol.  Reports she is doing relatively well.  Handling stress.  Does not feel needs any further intervention.  Has good support.  No chest pain.  Breathing stable.  No abdominal pain or bowel change reported.     Past Medical History:  Diagnosis Date   Arthritis    Breast cancer ([place])    Breast cancer, left ([place]) 2016   LT LUMPECTOMY - TI, NO, MO - IDC, ER/PR pos, Her 2 neg, Rad tx's.    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   CHF (congestive heart failure) (HCC)    COPD (chronic obstructive pulmonary disease) (HCC)    Coronary artery disease    Depression    Diffuse cystic mastopathy 2013   DVT (deep venous thrombosis) (HCC)    Dyspnea    Elevated TSH    Family history of adverse reaction to anesthesia    Pt stated that son had a seizure with a combination of anesthesia and pain medication."   Fatty liver    GERD (gastroesophageal reflux disease)    Glaucoma    High cholesterol    History of chicken pox    Hyperglycemia    Hyperlipidemia    Hypertension    LVH (left ventricular hypertrophy)    PAC (premature atrial contraction)    Palpitations    Peripheral vascular disease ([place])    Personal history of radiation therapy 2016   BREAST CA   Pneumonia March 2014   Skin cancer 2013   Sleep apnea    wears CPAP set at 2.5   Wears glasses    Past Surgical History:  Procedure Laterality Date   BACK SURGERY  1986   ruptured disc   BREAST BIOPSY Left 2008   NEG   BREAST BIOPSY Left 08-20-14   POS   BREAST BIOPSY Right 2007   NEG   BREAST  EXCISIONAL BIOPSY Left 1984   NEG   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 HER2 negative.  Wide excision with SLN biopsy.  MammoSite radiation   CARPAL TUNNEL RELEASE     CHOLECYSTECTOMY  2006   COLONOSCOPY  2010   Dr. Jamal Collin   COLONOSCOPY WITH PROPOFOL N/A 05/17/2017   Procedure: COLONOSCOPY WITH PROPOFOL;  Surgeon: Manya Silvas, MD;  Location[facility] ENDOSCOPY;  Service: Endoscopy;  Laterality: N/A;   DILATION AND CURETTAGE OF UTERUS     ESOPHAGOGASTRODUODENOSCOPY (EGD) WITH PROPOFOL N/A 05/17/2017   Procedure: ESOPHAGOGASTRODUODENOSCOPY (EGD) WITH PROPOFOL;  Surgeon: Manya Silvas, MD;  Location: [facility] ENDOSCOPY;  Service: Endoscopy;  Laterality: N/A;   LAPAROTOMY FOR REMOVAL TUMOR LUMBAR PLEXES     Leg stent  2011   MOLE REMOVAL  2013   15 removed   POSTERIOR CERVICAL LAMINECTOMY Left 10/15/2015   Procedure: Left Cervical four- five Hemilaminectomy/Remove mass;  Surgeon: Leeroy Cha, MD;  Location: [facility] NEURO ORS;  Service:  Neurosurgery;  Laterality: Left;  Left C4-5 Hemilaminectomy/Remove mass   Family History  Problem Relation Age of Onset   Cancer Father        Prostate   Diabetes Father    Cerebrovascular Accident Father    Hypertension Mother    AAA (abdominal aortic aneurysm) Mother    Hyperlipidemia Other        Parent   Miscarriages / Stillbirths Other        Parent   Hypertension Other        parent   Heart disease Other        Parent   Breast cancer Maternal Aunt 72   Social History   Socioeconomic History   Marital status: Married    Spouse name: Not on file   Number of children: 3   Years of education: 12   Highest education level: Not on file  Occupational History   Occupation: Caregiver   Tobacco Use   Smoking status: Never   Smokeless tobacco: Never  Substance and Sexual Activity   Alcohol use: No    Alcohol/week: 0.0 standard drinks of alcohol   Drug use: No   Sexual activity:  Never  Other Topics Concern   Not on file  Social History Narrative   Regular exercise-mo   Caffeine Use-no   Social Determinants of Health   Financial Resource Strain: Medium Risk (07/14/2021)   Overall Financial Resource Strain (CARDIA)    Difficulty of Paying Living Expenses: Somewhat hard  Food Insecurity: No Food Insecurity (11/24/2021)   Hunger Vital Sign    Worried About Running Out of Food in the Last Year: Never true    Ran Out of Food in the Last Year: Never true  Transportation Needs: No Transportation Needs (11/24/2021)   PRAPARE - Hydrologist (Medical): No    Lack of Transportation (Non-Medical): No  Physical Activity: Sufficiently Active (11/24/2021)   Exercise Vital Sign    Days of Exercise per Week: 7 days    Minutes of Exercise per Session: 30 min  Stress: No Stress Concern Present (11/24/2021)   [place]    Feeling of Stress : Only a little  Social Connections: Socially Integrated (11/24/2021)   Social Connection and Isolation Panel [NHANES]    Frequency of Communication with Friends and Family: More than three times a week    Frequency of Social Gatherings with Friends and Family: More than three times a week    Attends Religious Services: More than 4 times per year    Active Member of [employer] or Organizations: Yes    Attends Music therapist: More than 4 times per year    Marital Status: Married     Review of Systems  Constitutional:  Negative for appetite change and unexpected weight change.  HENT:  Negative for congestion and sinus pressure.   Respiratory:  Negative for cough, chest tightness and shortness of breath.   Cardiovascular:  Negative for chest pain, palpitations and leg swelling.  Gastrointestinal:  Negative for abdominal pain, diarrhea, nausea and vomiting.  Genitourinary:  Negative for difficulty urinating and dysuria.  Musculoskeletal:   Negative for joint swelling and myalgias.  Skin:  Negative for color change and rash.  Neurological:  Negative for dizziness, light-headedness and headaches.  Psychiatric/Behavioral:  Negative for agitation and dysphoric mood.        Objective:     BP (!) 124/56 (BP Location: Left  Arm, Patient Position: Sitting, Cuff Size: Normal)   Pulse (!) 57   Temp 97.6 F (36.4 C) (Oral)   Ht $R'5\' 5"'lm$  (1.651 m)   Wt 185 lb 3.2 oz (84 kg)   SpO2 96%   BMI 30.82 kg/m  Wt Readings from Last 3 Encounters:  02/24/22 185 lb 3.2 oz (84 kg)  12/19/21 190 lb 12.8 oz (86.5 kg)  11/24/21 193 lb (87.5 kg)    Physical Exam Vitals reviewed.  Constitutional:      General: She is not in acute distress.    Appearance: Normal appearance.  HENT:     Head: Normocephalic and atraumatic.     Right Ear: External ear normal.     Left Ear: External ear normal.  Eyes:     General: No scleral icterus.       Right eye: No discharge.        Left eye: No discharge.     Conjunctiva/sclera: Conjunctivae normal.  Neck:     Thyroid: No thyromegaly.  Cardiovascular:     Rate and Rhythm: Normal rate and regular rhythm.  Pulmonary:     Effort: No respiratory distress.     Breath sounds: Normal breath sounds. No wheezing.  Abdominal:     General: Bowel sounds are normal.     Palpations: Abdomen is soft.     Tenderness: There is no abdominal tenderness.  Musculoskeletal:        General: No swelling or tenderness.     Cervical back: Neck supple. No tenderness.  Lymphadenopathy:     Cervical: No cervical adenopathy.  Skin:    Findings: No erythema or rash.  Neurological:     Mental Status: She is alert.  Psychiatric:        Mood and Affect: Mood normal.        Behavior: Behavior normal.      Outpatient Encounter Medications as of 02/24/2022  Medication Sig   acetaminophen (TYLENOL) 650 MG CR tablet Take 650 mg by mouth as needed for pain.   aspirin 325 MG tablet Take 325 mg by mouth daily.   carvedilol  (COREG) 12.5 MG tablet TAKE 1 TABLET(12.5 MG) BY MOUTH TWICE DAILY WITH MEALS   Cholecalciferol 25 MCG (1000 UT) tablet Take 1,000 Units by mouth daily.    empagliflozin (JARDIANCE) 25 MG TABS tablet Take 1 tablet (25 mg total) by mouth daily before breakfast.   glucose blood (ONETOUCH VERIO) test strip Use to check blood sugars up to three times daily.   hydrALAZINE (APRESOLINE) 10 MG tablet TAKE 1 TABLET(10 MG) BY MOUTH THREE TIMES DAILY   losartan-hydrochlorothiazide (HYZAAR) 100-25 MG tablet TAKE 1 TABLET BY MOUTH DAILY   meclizine (ANTIVERT) 25 MG tablet Take 1 tablet (25 mg total) by mouth daily as needed for dizziness.   omeprazole (PRILOSEC) 40 MG capsule TAKE 1 CAPSULE(40 MG) BY MOUTH DAILY   OneTouch Delica Lancets 54M MISC Use to check blood sugars up to 3 times daily.   vitamin E 400 UNIT capsule Take 400 Units by mouth daily.   rosuvastatin (CRESTOR) 40 MG tablet TAKE 1 TABLET(40 MG) BY MOUTH DAILY (Patient not taking: Reported on 02/24/2022)   No facility-administered encounter medications on file as of 02/24/2022.     Lab Results  Component Value Date   WBC 6.1 02/19/2022   HGB 12.8 02/19/2022   HCT 37.7 02/19/2022   PLT 172.0 02/19/2022   GLUCOSE 110 (H) 02/19/2022   CHOL 144 02/19/2022   TRIG  230.0 (H) 02/19/2022   HDL 38.60 (L) 02/19/2022   LDLDIRECT 71.0 02/19/2022   LDLCALC 64 04/15/2017   ALT 14 02/19/2022   AST 12 02/19/2022   NA 142 02/19/2022   K 4.3 02/19/2022   CL 104 02/19/2022   CREATININE 1.28 (H) 02/19/2022   BUN 24 (H) 02/19/2022   CO2 27 02/19/2022   TSH 4.44 07/25/2021   INR 0.9 07/13/2012   HGBA1C 7.0 (H) 02/19/2022   MICROALBUR 1.0 10/30/2021    MM 3D SCREEN BREAST BILATERAL  Result Date: 12/18/2020 CLINICAL DATA:  Screening. EXAM: DIGITAL SCREENING BILATERAL MAMMOGRAM WITH TOMOSYNTHESIS AND CAD TECHNIQUE: Bilateral screening digital craniocaudal and mediolateral oblique mammograms were obtained. Bilateral screening digital breast  tomosynthesis was performed. The images were evaluated with computer-aided detection. COMPARISON:  Previous exam(s). ACR Breast Density Category c: The breast tissue is heterogeneously dense, which may obscure small masses. FINDINGS: There are no findings suspicious for malignancy. IMPRESSION: No mammographic evidence of malignancy. A result letter of this screening mammogram will be mailed directly to the patient. RECOMMENDATION: Screening mammogram in one year. (Code:SM-B-01Y) BI-RADS CATEGORY  1: Negative. Electronically Signed   By: Ammie Ferrier M.D.   On: 12/18/2020 14:54  DG Bone Density  Result Date: 12/18/2020 EXAM: DUAL X-RAY ABSORPTIOMETRY (DXA) FOR BONE MINERAL DENSITY IMPRESSION: Dear Dr. Nicki Reaper, Your patient JANISE GORA completed a FRAX assessment on 12/18/2020 using the [place] (analysis version: 14.10) manufactured by [employer]. The following summarizes the results of our evaluation. PATIENT BIOGRAPHICAL: Name: Elleanna, Melling Patient ID: [id number] Birth Date: [birth year] Height:    64.0 in. Gender:     Female    [age]       Weight:    195.6 lbs. Ethnicity:  White                            Exam Date: 12/18/2020 FRAX* RESULTS:  (version: 3.5) 10-year Probability of Fracture1 Major Osteoporotic Fracture2 Hip Fracture 17.8% 3.7% Population: [place] (Caucasian) Risk Factors: History of Fracture (Adult) Based on Femur (Right) Neck BMD 1 -The 10-year probability of fracture may be lower than reported if the patient has received treatment. 2 -Major Osteoporotic Fracture: Clinical Spine, Forearm, Hip or Shoulder *FRAX is a Materials engineer of the [employer] for Metabolic Bone Disease, a [place] (WHO) [facility]. ASSESSMENT: The probability of a major osteoporotic fracture is 17.8% within the next ten years. The probability of a hip fracture is 3.7% within the next ten years. . Your patient Chelise Hanger completed a  BMD test on 12/18/2020 using the [place] (software version: 14.10) manufactured by [employer]. The following summarizes the results of our evaluation. Technologist: [facility] PATIENT BIOGRAPHICAL: Name: Shelisha, Gautier Patient ID: [id number] Birth Date: 01-07-1946 Height: 64.0 in. Gender: Female Exam Date: 12/18/2020 Weight: 195.6 lbs. Indications: Advanced Age, Caucasian, Diabetic, Early Menopause, Height Loss, History of Breast Cancer, History of Fracture (Adult), History of Radiation, Postmenopausal Fractures: Left finger, Left wrist Treatments: aspirin, Jardiance, Omeprazole, Vitamin D DENSITOMETRY RESULTS: Site          Region     Measured Date Measured Age WHO Classification Young Adult T-score BMD         %Change vs. Previous Significant Change (*) DualFemur Neck Right 12/18/2020 76.3 Osteopenia -1.7 0.801 g/cm2 -0.7% - DualFemur Neck Right 10/05/2017 73.1 Osteopenia -  1.7 0.807 g/cm2 -1.3% - DualFemur Neck Right 02/01/2014 69.4 Osteopenia -1.6 0.818 g/cm2 - - DualFemur Total Mean 12/18/2020 76.3 Osteopenia -1.1 0.874 g/cm2 0.2% - DualFemur Total Mean 10/05/2017 73.1 Osteopenia -1.1 0.872 g/cm2 0.5% - DualFemur Total Mean 02/01/2014 69.4 Osteopenia -1.1 0.868 g/cm2 - - Right Forearm Radius 33% 12/18/2020 76.3 Normal -0.8 0.806 g/cm2 -8.1% Yes Right Forearm Radius 33% 02/01/2014 69.4 Normal 0.0 0.877 g/cm2 - - ASSESSMENT: The BMD measured at Femur Neck Right is 0.801 g/cm2 with a T-score of -1.7. This patient is considered osteopenic according to [facility]) criteria. The scan quality is good. Lumbar spine was not utilized due to advanced degenerative changes. Compared with prior study, there has been no significant change in the total hip. World Pharmacologist [facility]) criteria for post-menopausal, Caucasian Women: Normal:                   T-score at or above -1 SD Osteopenia/low bone mass: T-score between -1 and -2.5 SD Osteoporosis:             T-score at or below  -2.5 SD RECOMMENDATIONS: 1. All patients should optimize calcium and vitamin D intake. 2. Consider FDA-approved medical therapies in postmenopausal women and men aged 77 years and older, based on the following: a. A hip or vertebral(clinical or morphometric) fracture b. T-score < -2.5 at the femoral neck or spine after appropriate evaluation to exclude secondary causes c. Low bone mass (T-score between -1.0 and -2.5 at the femoral neck or spine) and a 10-year probability of a hip fracture > 3% or a 10-year probability of a major osteoporosis-related fracture > 20% based on the [place]-adapted WHO algorithm 3. Clinician judgment and/or patient preferences may indicate treatment for people with 10-year fracture probabilities above or below these levels FOLLOW-UP: People with diagnosed cases of osteoporosis or at high risk for fracture should have regular bone mineral density tests. For patients eligible for Medicare, routine testing is allowed once every 2 years. The testing frequency can be increased to one year for patients who have rapidly progressing disease, those who are receiving or discontinuing medical therapy to restore bone mass, or have additional risk factors. I have reviewed this report, and agree with the above findings. Mark A. Thornton Papas, M.D. [facility] Electronically Signed   By: Lavonia Dana M.D.   On: 12/18/2020 14:51       Assessment & Plan:   Problem List Items Addressed This Visit     CAD (coronary artery disease)    Followed by cardiology.  No pain.  Continue risk factor modification.  Continue crestor.  Followed by cardiology.         Carotid artery disease ([place])    Overdue f/u. See above.       CKD (chronic kidney disease), stage III (HCC)    Avoid anti-inflammatories..  Stay hydrated. Urine - no protein.  Follow metabolic panel. Continue ARB      COPD (chronic obstructive pulmonary disease) (HCC)    Breathing overall stable.  No sob.  Follow.       Diabetes  mellitus with cardiac complication (HCC)    On jardiance.  a1c 7.0.  Low carb diet and exercise.  Have discussed additional medication and discussed again today. She has wanted to hold on further medication.  Follow met b and a1c. Eye exam - 01/2022.       Relevant Orders   Hemoglobin A1c   GERD (gastroesophageal reflux disease)  No upper symptom reported.  On prilosec.       History of breast cancer    Completed femara.  Mammogram 12/2020 - Birads I.  Mammogram has been ordered.  Need to schedule.       History of colon polyps    Last colonoscopy January 2019 (Dr. Vira Agar).  Per patient recommended follow-up in 3 years.  Overdue follow-up. Have discussed.  Declines.       Hypercholesterolemia    On Crestor.  Low-cholesterol diet and exercise. Discussed elevated triglycerides.  LDL 71.  Follow-up lipid panel liver function test.      Relevant Orders   Hepatic function panel   Lipid panel   Hypertension    Continue losartan/hctz, coreg and hydralazine.  Follow pressures.  Follow metabolic panel.      Relevant Orders   Basic metabolic panel   Peripheral vascular disease ([place])    Followed by a AVVS.  Continue aspirin and Crestor. Overdue f/u.  Have discussed.  Had reported will notify when agreeable.       Stress    Increased stress.  Has good support.  Overall appears to be doing better.  Follow.       Thyroid nodule    Evaluated by Dr Gabriel Carina.  Biopsy negative.  Follow tsh.        Relevant Orders   TSH     Einar Pheasant, MD

## 2022-03-01 ENCOUNTER — Encounter: Payer: Self-pay | Admitting: Internal Medicine

## 2022-03-01 NOTE — Assessment & Plan Note (Signed)
Continue losartan/hctz, coreg and hydralazine.  Follow pressures.  Follow metabolic panel. 

## 2022-03-01 NOTE — Assessment & Plan Note (Signed)
Increased stress.  Has good support.  Overall appears to be doing better.  Follow.  

## 2022-03-01 NOTE — Assessment & Plan Note (Signed)
Evaluated by Dr Gabriel Carina.  Biopsy negative.  Follow tsh.

## 2022-03-01 NOTE — Assessment & Plan Note (Addendum)
On Crestor.  Low-cholesterol diet and exercise. Discussed elevated triglycerides.  LDL 71.  Follow-up lipid panel liver function test.

## 2022-03-01 NOTE — Assessment & Plan Note (Signed)
Overdue f/u. See above.

## 2022-03-01 NOTE — Assessment & Plan Note (Signed)
Followed by a AVVS.  Continue aspirin and Crestor. Overdue f/u.  Have discussed.  Had reported will notify when agreeable.

## 2022-03-01 NOTE — Assessment & Plan Note (Signed)
Avoid anti-inflammatories..  Stay hydrated. Urine - no protein.  Follow metabolic panel. Continue ARB 

## 2022-03-01 NOTE — Assessment & Plan Note (Signed)
Last colonoscopy January 2019 (Dr. Elliott).  Per patient recommended follow-up in 3 years.  Overdue follow-up. Have discussed.  Declines.  

## 2022-03-01 NOTE — Assessment & Plan Note (Signed)
Followed by cardiology.  No pain.  Continue risk factor modification.  Continue crestor.  Followed by cardiology.

## 2022-03-01 NOTE — Assessment & Plan Note (Signed)
Breathing overall stable.  No sob.  Follow.  

## 2022-03-01 NOTE — Assessment & Plan Note (Signed)
No upper symptom reported.  On prilosec.  

## 2022-03-01 NOTE — Assessment & Plan Note (Addendum)
On jardiance.  a1c 7.0.  Low carb diet and exercise.  Have discussed additional medication and discussed again today. She has wanted to hold on further medication.  Follow met b and a1c. Eye exam - 01/2022.

## 2022-03-01 NOTE — Assessment & Plan Note (Signed)
Completed femara.  Mammogram 12/2020 - Birads I.  Mammogram has been ordered.  Need to schedule.  

## 2022-03-13 ENCOUNTER — Telehealth: Payer: Self-pay

## 2022-03-13 NOTE — Telephone Encounter (Signed)
-----   Message from Jason Fila, CPhT sent at 03/13/2022 12:48 PM EDT ----- Regarding: question about paperwork that patient gave to someone at her appt o 02/24/22 Good afternoon,  Patient informed me that she left her paperwork for re enrollment with [place] [name]) along with income information to someone at her appointment on 02/24/22. She informed to have them fax it to me at [facility]. I have yet to receive it.  Would you happen to know if she gave it to someone and if they could please fax it to me?  Thanks,  [name]. [place], [place]  [phone]-fax

## 2022-03-17 ENCOUNTER — Telehealth: Payer: Self-pay

## 2022-03-17 NOTE — Telephone Encounter (Signed)
Pt advised paperwork faxed

## 2022-03-17 NOTE — Telephone Encounter (Signed)
Pt called stating that 2-3 weeks ago she personally handed Dr. Nicki Reaper some paper work that needed to be filled out and it was supposed to be sent to someone else and they have still not gotten it yet.   Pt would like a call back.

## 2022-03-18 ENCOUNTER — Telehealth: Payer: Self-pay | Admitting: Pharmacy Technician

## 2022-03-18 DIAGNOSIS — Z596 Low income: Secondary | ICD-10-CM

## 2022-03-18 NOTE — Progress Notes (Signed)
[place] [facility])                                            [place] Team    03/18/2022  YANAI HOBSON 1945-10-18 [id number]                                      Medication Assistance Referral-For 2024 re enrollment  Referral From: [place]  Medication/Company: [name] Patient application portion:  N/A she took to her provider appointment Provider application portion:  N/A patient to took to appointment  to Dr. Einar Pheasant Provider address/fax verified via: Office website  Received both patient and provider portion(s) of patient assistance application(s) for Jardiance. Faxed completed application and required documents into [employer] for 2024 renewal.  Lilyanne Mcquown P. Birdell Frasier, [place]  [phone]

## 2022-04-10 ENCOUNTER — Telehealth: Payer: Self-pay | Admitting: Pharmacy Technician

## 2022-04-10 DIAGNOSIS — G4733 Obstructive sleep apnea (adult) (pediatric): Secondary | ICD-10-CM | POA: Diagnosis not present

## 2022-04-10 DIAGNOSIS — Z596 Low income: Secondary | ICD-10-CM

## 2022-04-10 NOTE — Progress Notes (Signed)
[place] [facility])                                            [place] Team    04/10/2022  Lindsey Bentley 1945/10/08 [id number]  Care coordination call placed to [facility] in regard to [facility] [facility] application.  Spoke to Denny Peon who informs patient is APPROVED 05/11/22-05/11/23. Patient will need to call [employer] for refills as she needs them  and then the medication will be delivered to her home. Patient is aware of her approval.  Taisha Pennebaker P. Robynn Marcel, [place]  [phone]

## 2022-04-14 DIAGNOSIS — I10 Essential (primary) hypertension: Secondary | ICD-10-CM | POA: Diagnosis not present

## 2022-04-14 DIAGNOSIS — G4733 Obstructive sleep apnea (adult) (pediatric): Secondary | ICD-10-CM | POA: Diagnosis not present

## 2022-04-14 DIAGNOSIS — E782 Mixed hyperlipidemia: Secondary | ICD-10-CM | POA: Diagnosis not present

## 2022-04-23 ENCOUNTER — Telehealth: Payer: Self-pay | Admitting: Internal Medicine

## 2022-04-23 ENCOUNTER — Other Ambulatory Visit: Payer: Self-pay | Admitting: Internal Medicine

## 2022-04-23 ENCOUNTER — Telehealth (INDEPENDENT_AMBULATORY_CARE_PROVIDER_SITE_OTHER): Payer: Medicare Other | Admitting: Family Medicine

## 2022-04-23 ENCOUNTER — Encounter: Payer: Self-pay | Admitting: Family Medicine

## 2022-04-23 VITALS — BP 139/62 | HR 63 | Temp 98.4°F

## 2022-04-23 DIAGNOSIS — R051 Acute cough: Secondary | ICD-10-CM

## 2022-04-23 DIAGNOSIS — R0981 Nasal congestion: Secondary | ICD-10-CM

## 2022-04-23 MED ORDER — BENZONATATE 100 MG PO CAPS: ORAL_CAPSULE | ORAL | 0 refills | Status: DC

## 2022-04-23 NOTE — Telephone Encounter (Signed)
Bad head cold , nasal congestion and cough. Has been taking Robitussin. Wants an appointment with Dr. Nicki Reaper.

## 2022-04-23 NOTE — Progress Notes (Signed)
Virtual Visit via Video Note  I connected with Lindsey Bentley  on 04/23/22 at  3:00 PM EST by a video enabled telemedicine application and verified that I am speaking with the correct person using two identifiers.  Location patient:  Location provider:work or home office Persons participating in the virtual visit: patient, provider  I discussed the limitations and requested verbal permission for telemedicine visit. The patient expressed understanding and agreed to proceed.   HPI:  Acute telemedicine visit for : -Onset: about 3-4 days ago -thinks got it from grand daughter -Symptoms include: nasal congestion, cough -Denies:CP, SOB, NVD, body aches, NVD, denies discolored mucus, wheezing, CP, SOB, chest tightness -Pertinent past medical history: see below -Pertinent medication allergies: Allergies  Allergen Reactions   Codeine Nausea And Vomiting   Prednisone Swelling   Tape Itching and Rash    Please use "paper" tape Please use "paper" tape   -COVID-19 vaccine status:  Immunization History  Administered Date(s) Administered   Pneumococcal Conjugate-13 12/17/2016   Pneumococcal Polysaccharide-23 07/26/2012   Td 05/11/2004     ROS: See pertinent positives and negatives per HPI.  Past Medical History:  Diagnosis Date   Arthritis    Breast cancer ([place])    Breast cancer, left ([place]) 2016   LT LUMPECTOMY - TI, NO, MO - IDC, ER/PR pos, Her 2 neg, Rad tx's.    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   CHF (congestive heart failure) (HCC)    COPD (chronic obstructive pulmonary disease) (HCC)    Coronary artery disease    Depression    Diffuse cystic mastopathy 2013   DVT (deep venous thrombosis) (HCC)    Dyspnea    Elevated TSH    Family history of adverse reaction to anesthesia    Pt stated that son had a seizure with a combination of anesthesia and pain medication."   Fatty liver    GERD  (gastroesophageal reflux disease)    Glaucoma    High cholesterol    History of chicken pox    Hyperglycemia    Hyperlipidemia    Hypertension    LVH (left ventricular hypertrophy)    PAC (premature atrial contraction)    Palpitations    Peripheral vascular disease ([place])    Personal history of radiation therapy 2016   BREAST CA   Pneumonia March 2014   Skin cancer 2013   Sleep apnea    wears CPAP set at 2.5   Wears glasses     Past Surgical History:  Procedure Laterality Date   BACK SURGERY  1986   ruptured disc   BREAST BIOPSY Left 2008   NEG   BREAST BIOPSY Left 08-20-14   POS   BREAST BIOPSY Right 2007   NEG   BREAST EXCISIONAL BIOPSY Left 1984   NEG   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 HER2 negative.  Wide excision with SLN biopsy.  MammoSite radiation   CARPAL TUNNEL RELEASE     CHOLECYSTECTOMY  2006   COLONOSCOPY  2010   Dr. Jamal Collin   COLONOSCOPY WITH PROPOFOL N/A 05/17/2017   Procedure: COLONOSCOPY WITH PROPOFOL;  Surgeon: Manya Silvas, MD;  Location: [facility] ENDOSCOPY;  Service: Endoscopy;  Laterality: N/A;   DILATION AND CURETTAGE OF UTERUS     ESOPHAGOGASTRODUODENOSCOPY (EGD) WITH PROPOFOL N/A 05/17/2017   Procedure:  ESOPHAGOGASTRODUODENOSCOPY (EGD) WITH PROPOFOL;  Surgeon: Manya Silvas, MD;  Location: [facility] ENDOSCOPY;  Service: Endoscopy;  Laterality: N/A;   LAPAROTOMY FOR REMOVAL TUMOR LUMBAR PLEXES     Leg stent  2011   MOLE REMOVAL  2013   15 removed   POSTERIOR CERVICAL LAMINECTOMY Left 10/15/2015   Procedure: Left Cervical four- five Hemilaminectomy/Remove mass;  Surgeon: Leeroy Cha, MD;  Location: [place] [facility] ORS;  Service: Neurosurgery;  Laterality: Left;  Left C4-5 Hemilaminectomy/Remove mass     Current Outpatient Medications:    benzonatate (TESSALON PERLES) 100 MG capsule, 1-2 capsules up to twice daily as needed for cough., Disp: 30 capsule, Rfl: 0   acetaminophen  (TYLENOL) 650 MG CR tablet, Take 650 mg by mouth as needed for pain., Disp: , Rfl:    aspirin 325 MG tablet, Take 325 mg by mouth daily., Disp: , Rfl:    carvedilol (COREG) 12.5 MG tablet, TAKE 1 TABLET(12.5 MG) BY MOUTH TWICE DAILY WITH MEALS, Disp: 180 tablet, Rfl: 1   Cholecalciferol 25 MCG (1000 UT) tablet, Take 1,000 Units by mouth daily. , Disp: , Rfl:    empagliflozin (JARDIANCE) 25 MG TABS tablet, Take 1 tablet (25 mg total) by mouth daily before breakfast., Disp: 90 tablet, Rfl: 3   glucose blood (ONETOUCH VERIO) test strip, Use to check blood sugars up to three times daily., Disp: 300 each, Rfl: 3   hydrALAZINE (APRESOLINE) 10 MG tablet, TAKE 1 TABLET(10 MG) BY MOUTH THREE TIMES DAILY, Disp: 270 tablet, Rfl: 1   losartan-hydrochlorothiazide (HYZAAR) 100-25 MG tablet, TAKE 1 TABLET BY MOUTH DAILY, Disp: 90 tablet, Rfl: 1   meclizine (ANTIVERT) 25 MG tablet, Take 1 tablet (25 mg total) by mouth daily as needed for dizziness., Disp: 30 tablet, Rfl: 0   omeprazole (PRILOSEC) 40 MG capsule, TAKE 1 CAPSULE(40 MG) BY MOUTH DAILY, Disp: 90 capsule, Rfl: 3   OneTouch Delica Lancets 75Q MISC, Use to check blood sugars up to 3 times daily., Disp: 300 each, Rfl: 3   rosuvastatin (CRESTOR) 40 MG tablet, TAKE 1 TABLET(40 MG) BY MOUTH DAILY (Patient not taking: Reported on 02/24/2022), Disp: 90 tablet, Rfl: 3   vitamin E 400 UNIT capsule, Take 400 Units by mouth daily., Disp: , Rfl:   EXAM:  VITALS per patient if applicable: Today's Vitals   04/23/22 1416  BP: 139/62  Pulse: 63  Temp: 98.4 F (36.9 C)  SpO2: 94%   There is no height or weight on file to calculate BMI.  GENERAL: alert, oriented, appears well and in no acute distress  HEENT: atraumatic, conjunttiva clear, no obvious abnormalities on inspection of external nose and ears  NECK: normal movements of the head and neck  LUNGS: on inspection no signs of respiratory distress, breathing rate appears normal, no obvious gross SOB,  gasping or wheezing  CV: no obvious cyanosis  MS: moves all visible extremities without noticeable abnormality  PSYCH/NEURO: pleasant and cooperative, no obvious depression or anxiety, speech and thought processing grossly intact  ASSESSMENT AND PLAN:  Discussed the following assessment and plan:  Nasal congestion  Acute cough  -we discussed possible serious and likely etiologies, options for evaluation and workup, limitations of telemedicine visit vs in person visit, treatment, treatment risks and precautions. Pt is agreeable to treatment via telemedicine at this moment. Query VURI, covid vs other. Advised covid testing and she [facility] opted [facility]. Advised if positive and wishes to do treatment some of the pharmacies can do  this or to do follow up vv tomorrow - let her know I do not work tomorrow so she would need to call PCP. Opted for nasal saline rinse and Tessalon for rx in interim. Advised to monitor )2 carefully as is borderline. She declines inperson visit to further eval as  reports does not have any CPB, wheezng, CP, tightness, etc. She agrees to monitor and seek inperson care if drops, worsening, new symptoms arise, or if is not improving with treatment as expected per our conversation of expected course. Discussed options for follow up care. Did let this patient know that I do telemedicine on Tuesdays and Thursdays for [place] and those are the days I am logged into the system. Advised to schedule follow up visit with PCP, [place] virtual visits or [facility] if any further questions or concerns to avoid delays in care.   I discussed the assessment and treatment plan with the patient. The patient was provided an opportunity to ask questions and all were answered. The patient agreed with the plan and demonstrated an understanding of the instructions.     Lucretia Kern, DO

## 2022-04-23 NOTE — Patient Instructions (Signed)
  HOME CARE TIPS:  -COVID19 testing information: [email or web address]  Most pharmacies also offer testing and home test kits. If the Covid19 test is positive and you desire antiviral treatment, please contact a [place] or schedule a follow up virtual visit through your primary care office or through the [employer].  Other test to treat options: ConnectRV.is?[email or web address]  -I sent the medication(s) we discussed to your pharmacy: Meds ordered this encounter  Medications   benzonatate (TESSALON PERLES) 100 MG capsule    Sig: 1-2 capsules up to twice daily as needed for cough.    Dispense:  30 capsule    Refill:  0    -Monitor oxygen level if <95 on average, seek prompt inperson medical evaluation  -nasal saline sinus rinses twice daily  -stay hydrated, drink plenty of fluids and eat small healthy meals - avoid dairy  -follow up with your doctor in 2-3 days unless improving and feeling better  -stay home while sick, except to seek medical care. If you have COVID19, you will likely be contagious for 7-10 days. Flu or Influenza is likely contagious for about 7 days. Other respiratory viral infections remain contagious for 5-10+ days depending on the virus and many other factors. Wear a good mask that fits snugly (such as N95 or KN95) if around others to reduce the risk of transmission.  It was nice to meet you today, and I really hope you are feeling better soon. I help [place] out with telemedicine visits on Tuesdays and Thursdays and am happy to help if you need a follow up virtual visit on those days. Otherwise, if you have any concerns or questions following this visit please schedule a follow up visit with your Primary Care doctor or seek care at a local urgent care clinic to avoid delays in care.    Seek in person care or schedule a follow up video visit promptly if your symptoms worsen, new concerns arise or you  are not improving with treatment. Call 911 and/or seek emergency care if your symptoms are severe or life threatening.

## 2022-04-23 NOTE — Telephone Encounter (Signed)
S/w pt - congestion x 1 week , starting to cough. Using saline nasal spray, humidifier, mucinex, and robitussin  Not helping. Asking for antibiotic Pt denies fever, chills, chest pain, s.o.b, dizziness  Sched w/ Dr Maudie Mercury for virtual this afternoon

## 2022-04-30 ENCOUNTER — Other Ambulatory Visit: Payer: Self-pay

## 2022-04-30 ENCOUNTER — Telehealth: Payer: Self-pay | Admitting: Internal Medicine

## 2022-04-30 MED ORDER — ALBUTEROL SULFATE HFA 108 (90 BASE) MCG/ACT IN AERS: 2.0000 | INHALATION_SPRAY | Freq: Four times a day (QID) | RESPIRATORY_TRACT | 2 refills | Status: DC | PRN

## 2022-04-30 NOTE — Telephone Encounter (Signed)
Ok to refill pro air

## 2022-04-30 NOTE — Telephone Encounter (Signed)
Prescription Request  04/30/2022  Is this a "Controlled Substance" medicine? No  LOV: 02/24/2022  What is the name of the medication or equipment? Proairhfa inhaler Take 2 puffs every 6hrs  Have you contacted your pharmacy to request a refill? Yes   Which pharmacy would you like this sent to?  [facility] [place], [address] AT [address] [place] [address] Phone: [phone] Fax: [phone]     Patient notified that their request is being sent to the clinical staff for review and that they should receive a response within 2 business days.   Please advise at Mobile [phone] (mobile)

## 2022-04-30 NOTE — Telephone Encounter (Signed)
Sent - pt advised

## 2022-05-15 ENCOUNTER — Other Ambulatory Visit: Payer: Self-pay | Admitting: Family

## 2022-05-18 ENCOUNTER — Telehealth: Payer: Self-pay

## 2022-05-18 ENCOUNTER — Other Ambulatory Visit: Payer: Self-pay

## 2022-05-18 MED ORDER — HYDRALAZINE HCL 10 MG PO TABS: ORAL_TABLET | ORAL | 1 refills | Status: DC

## 2022-05-18 NOTE — Telephone Encounter (Signed)
sent 

## 2022-05-18 NOTE — Telephone Encounter (Signed)
Prescription Request  05/18/2022  Is this a "Controlled Substance" medicine? No  LOV: Visit date not found  What is the name of the medication or equipment? hydrALAZINE (APRESOLINE) 10 MG tablet  Have you contacted your pharmacy to request a refill? Yes   Which pharmacy would you like this sent to?  [facility]   Patient notified that their request is being sent to the clinical staff for review and that they should receive a response within 2 business days.   Please advise at [facility] [phone]   Patient states she has run out of the medication and the pharmacy gave her an emergency supply to last until today.

## 2022-05-27 ENCOUNTER — Other Ambulatory Visit (INDEPENDENT_AMBULATORY_CARE_PROVIDER_SITE_OTHER): Payer: Medicare Other

## 2022-05-27 DIAGNOSIS — E1159 Type 2 diabetes mellitus with other circulatory complications: Secondary | ICD-10-CM | POA: Diagnosis not present

## 2022-05-27 DIAGNOSIS — I10 Essential (primary) hypertension: Secondary | ICD-10-CM | POA: Diagnosis not present

## 2022-05-27 DIAGNOSIS — E041 Nontoxic single thyroid nodule: Secondary | ICD-10-CM

## 2022-05-27 DIAGNOSIS — E7800 Pure hypercholesterolemia, unspecified: Secondary | ICD-10-CM | POA: Diagnosis not present

## 2022-05-27 LAB — BASIC METABOLIC PANEL
BUN: 23 mg/dL (ref 6–23)
CO2: 27 mEq/L (ref 19–32)
Calcium: 9.2 mg/dL (ref 8.4–10.5)
Chloride: 104 mEq/L (ref 96–112)
Creatinine, Ser: 1.1 mg/dL (ref 0.40–1.20)
GFR: 48.37 mL/min — ABNORMAL LOW (ref >60.00)
Glucose, Bld: 120 mg/dL — ABNORMAL HIGH (ref 70–99)
Potassium: 4.2 mEq/L (ref 3.5–5.1)
Sodium: 139 mEq/L (ref 135–145)

## 2022-05-27 LAB — HEPATIC FUNCTION PANEL
ALT: 14 U/L (ref 0–35)
AST: 14 U/L (ref 0–37)
Albumin: 4.3 g/dL (ref 3.5–5.2)
Alkaline Phosphatase: 45 U/L (ref 39–117)
Bilirubin, Direct: 0.1 mg/dL (ref 0.0–0.3)
Total Bilirubin: 0.5 mg/dL (ref 0.2–1.2)
Total Protein: 7.4 g/dL (ref 6.0–8.3)

## 2022-05-27 LAB — LIPID PANEL
Cholesterol: 135 mg/dL (ref 0–200)
HDL: 41.6 mg/dL (ref >39.00)
LDL Cholesterol: 57 mg/dL (ref 0–99)
NonHDL: 93.15
Total CHOL/HDL Ratio: 3
Triglycerides: 179 mg/dL — ABNORMAL HIGH (ref 0.0–149.0)
VLDL: 35.8 mg/dL (ref 0.0–40.0)

## 2022-05-27 LAB — TSH: TSH: 5.33 u[IU]/mL (ref 0.35–5.50)

## 2022-05-27 LAB — HEMOGLOBIN A1C: Hgb A1c MFr Bld: 6.9 % — ABNORMAL HIGH (ref 4.6–6.5)

## 2022-06-01 ENCOUNTER — Ambulatory Visit: Payer: Medicare Other | Admitting: Internal Medicine

## 2022-06-03 ENCOUNTER — Encounter: Payer: Self-pay | Admitting: Internal Medicine

## 2022-06-03 ENCOUNTER — Ambulatory Visit (INDEPENDENT_AMBULATORY_CARE_PROVIDER_SITE_OTHER): Payer: Medicare Other | Admitting: Internal Medicine

## 2022-06-03 VITALS — BP 130/70 | HR 70 | Temp 97.9°F | Resp 16 | Ht 65.0 in | Wt 185.0 lb

## 2022-06-03 DIAGNOSIS — E7800 Pure hypercholesterolemia, unspecified: Secondary | ICD-10-CM | POA: Diagnosis not present

## 2022-06-03 DIAGNOSIS — I10 Essential (primary) hypertension: Secondary | ICD-10-CM

## 2022-06-03 DIAGNOSIS — J449 Chronic obstructive pulmonary disease, unspecified: Secondary | ICD-10-CM | POA: Diagnosis not present

## 2022-06-03 DIAGNOSIS — N1831 Chronic kidney disease, stage 3a: Secondary | ICD-10-CM | POA: Diagnosis not present

## 2022-06-03 DIAGNOSIS — R7989 Other specified abnormal findings of blood chemistry: Secondary | ICD-10-CM | POA: Diagnosis not present

## 2022-06-03 DIAGNOSIS — F439 Reaction to severe stress, unspecified: Secondary | ICD-10-CM

## 2022-06-03 DIAGNOSIS — I700 Atherosclerosis of aorta: Secondary | ICD-10-CM

## 2022-06-03 DIAGNOSIS — I779 Disorder of arteries and arterioles, unspecified: Secondary | ICD-10-CM

## 2022-06-03 DIAGNOSIS — E1159 Type 2 diabetes mellitus with other circulatory complications: Secondary | ICD-10-CM | POA: Diagnosis not present

## 2022-06-03 DIAGNOSIS — I25119 Atherosclerotic heart disease of native coronary artery with unspecified angina pectoris: Secondary | ICD-10-CM

## 2022-06-03 DIAGNOSIS — Z853 Personal history of malignant neoplasm of breast: Secondary | ICD-10-CM

## 2022-06-03 DIAGNOSIS — Z860100 Personal history of colon polyps, unspecified: Secondary | ICD-10-CM

## 2022-06-03 DIAGNOSIS — L989 Disorder of the skin and subcutaneous tissue, unspecified: Secondary | ICD-10-CM

## 2022-06-03 DIAGNOSIS — K219 Gastro-esophageal reflux disease without esophagitis: Secondary | ICD-10-CM | POA: Diagnosis not present

## 2022-06-03 DIAGNOSIS — I739 Peripheral vascular disease, unspecified: Secondary | ICD-10-CM

## 2022-06-03 NOTE — Progress Notes (Signed)
[name], female   DOB: Jun 27, 1945, 77 y.o.   MRN: [id number]   Subjective:    [name], female    DOB: [birth year], 77 y.o.   MRN: [id number]    Patient here for  Chief Complaint  Patient presents with   Medical Management of Chronic Issues   .   HPI Here to follow up regarding increased stress, blood pressure and cholesterol.  Reports she is doing relatively well.  Handling stress.  Does not feel needs any further intervention.  Has good support.  No chest pain.  Breathing stable.  Saw Dr Nehemiah Massed 04/14/22.  Felt stable. No abdominal pain or bowel change reported.  Overdue f/u colonoscopy.  Has declined.  Overdue mammogram.     Past Medical History:  Diagnosis Date   Arthritis    Breast cancer ([place])    Breast cancer, left ([place]) 2016   LT LUMPECTOMY - TI, NO, MO - IDC, ER/PR pos, Her 2 neg, Rad tx's.    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   CHF (congestive heart failure) (HCC)    COPD (chronic obstructive pulmonary disease) (HCC)    Coronary artery disease    Depression    Diffuse cystic mastopathy 2013   DVT (deep venous thrombosis) (HCC)    Dyspnea    Elevated TSH    Family history of adverse reaction to anesthesia    Pt stated that son had a seizure with a combination of anesthesia and pain medication."   Fatty liver    GERD (gastroesophageal reflux disease)    Glaucoma    High cholesterol    History of chicken pox    Hyperglycemia    Hyperlipidemia    Hypertension    LVH (left ventricular hypertrophy)    PAC (premature atrial contraction)    Palpitations    Peripheral vascular disease ([place])    Personal history of radiation therapy 2016   BREAST CA   Pneumonia March 2014   Skin cancer 2013   Sleep apnea    wears CPAP set at 2.5   Wears glasses    Past Surgical History:  Procedure Laterality Date   BACK SURGERY  1986   ruptured disc   BREAST  BIOPSY Left 2008   NEG   BREAST BIOPSY Left 08-20-14   POS   BREAST BIOPSY Right 2007   NEG   BREAST EXCISIONAL BIOPSY Left 1984   NEG   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 HER2 negative.  Wide excision with SLN biopsy.  MammoSite radiation   CARPAL TUNNEL RELEASE     CHOLECYSTECTOMY  2006   COLONOSCOPY  2010   Dr. Jamal Collin   COLONOSCOPY WITH PROPOFOL N/A 05/17/2017   Procedure: COLONOSCOPY WITH PROPOFOL;  Surgeon: Manya Silvas, MD;  Location: [facility] ENDOSCOPY;  Service: Endoscopy;  Laterality: N/A;   DILATION AND CURETTAGE OF UTERUS     ESOPHAGOGASTRODUODENOSCOPY (EGD) WITH PROPOFOL N/A 05/17/2017   Procedure: ESOPHAGOGASTRODUODENOSCOPY (EGD) WITH PROPOFOL;  Surgeon: Manya Silvas, MD;  Location: [facility] ENDOSCOPY;  Service: Endoscopy;  Laterality: N/A;   LAPAROTOMY FOR REMOVAL TUMOR LUMBAR PLEXES     Leg stent  2011   MOLE REMOVAL  2013   15 removed   POSTERIOR CERVICAL LAMINECTOMY Left 10/15/2015   Procedure: Left Cervical four-  five Hemilaminectomy/Remove mass;  Surgeon: Leeroy Cha, MD;  Location: [facility] NEURO ORS;  Service: Neurosurgery;  Laterality: Left;  Left C4-5 Hemilaminectomy/Remove mass   Family History  Problem Relation Age of Onset   Cancer Father        Prostate   Diabetes Father    Cerebrovascular Accident Father    Hypertension Mother    AAA (abdominal aortic aneurysm) Mother    Hyperlipidemia Other        Parent   Miscarriages / Stillbirths Other        Parent   Hypertension Other        parent   Heart disease Other        Parent   Breast cancer Maternal Aunt 72   Social History   Socioeconomic History   Marital status: Married    Spouse name: Not on file   Number of children: 3   Years of education: 12   Highest education level: Not on file  Occupational History   Occupation: Caregiver   Tobacco Use   Smoking status: Never   Smokeless tobacco: Never  Substance and Sexual  Activity   Alcohol use: No    Alcohol/week: 0.0 standard drinks of alcohol   Drug use: No   Sexual activity: Never  Other Topics Concern   Not on file  Social History Narrative   Regular exercise-mo   Caffeine Use-no   Social Determinants of Health   Financial Resource Strain: Medium Risk (07/14/2021)   Overall Financial Resource Strain (CARDIA)    Difficulty of Paying Living Expenses: Somewhat hard  Food Insecurity: No Food Insecurity (11/24/2021)   Hunger Vital Sign    Worried About Running Out of Food in the Last Year: Never true    Ran Out of Food in the Last Year: Never true  Transportation Needs: No Transportation Needs (11/24/2021)   PRAPARE - Hydrologist (Medical): No    Lack of Transportation (Non-Medical): No  Physical Activity: Sufficiently Active (11/24/2021)   Exercise Vital Sign    Days of Exercise per Week: 7 days    Minutes of Exercise per Session: 30 min  Stress: No Stress Concern Present (11/24/2021)   [place]    Feeling of Stress : Only a little  Social Connections: Socially Integrated (11/24/2021)   Social Connection and Isolation Panel [NHANES]    Frequency of Communication with Friends and Family: More than three times a week    Frequency of Social Gatherings with Friends and Family: More than three times a week    Attends Religious Services: More than 4 times per year    Active Member of [employer] or Organizations: Yes    Attends Music therapist: More than 4 times per year    Marital Status: Married     Review of Systems  Constitutional:  Negative for appetite change and unexpected weight change.  HENT:  Negative for congestion and sinus pressure.   Respiratory:  Negative for cough, chest tightness and shortness of breath.   Cardiovascular:  Negative for chest pain, palpitations and leg swelling.  Gastrointestinal:  Negative for abdominal pain,  diarrhea, nausea and vomiting.  Genitourinary:  Negative for difficulty urinating and dysuria.  Musculoskeletal:  Negative for arthralgias and joint swelling.  Skin:  Negative for color change and rash.       Skin lesions - back.   Neurological:  Negative for dizziness, light-headedness and headaches.  Psychiatric/Behavioral:  Negative for agitation and dysphoric mood.        Objective:     BP 130/70   Pulse 70   Temp 97.9 F (36.6 C)   Resp 16   Ht '5\' 5"'$  (1.651 m)   Wt 185 lb (83.9 kg)   SpO2 97%   BMI 30.79 kg/m  Wt Readings from Last 3 Encounters:  06/03/22 185 lb (83.9 kg)  02/24/22 185 lb 3.2 oz (84 kg)  12/19/21 190 lb 12.8 oz (86.5 kg)    Physical Exam Vitals reviewed.  Constitutional:      General: She is not in acute distress.    Appearance: Normal appearance.  HENT:     Head: Normocephalic and atraumatic.     Right Ear: External ear normal.     Left Ear: External ear normal.  Eyes:     General: No scleral icterus.       Right eye: No discharge.        Left eye: No discharge.     Conjunctiva/sclera: Conjunctivae normal.  Neck:     Thyroid: No thyromegaly.  Cardiovascular:     Rate and Rhythm: Normal rate and regular rhythm.  Pulmonary:     Effort: No respiratory distress.     Breath sounds: Normal breath sounds. No wheezing.  Abdominal:     General: Bowel sounds are normal.     Palpations: Abdomen is soft.     Tenderness: There is no abdominal tenderness.  Musculoskeletal:        General: No swelling or tenderness.     Cervical back: Neck supple. No tenderness.  Lymphadenopathy:     Cervical: No cervical adenopathy.  Skin:    Findings: No erythema or rash.  Neurological:     Mental Status: She is alert.  Psychiatric:        Mood and Affect: Mood normal.        Behavior: Behavior normal.      Outpatient Encounter Medications as of 06/03/2022  Medication Sig   albuterol (VENTOLIN HFA) 108 (90 Base) MCG/ACT inhaler Inhale 2 puffs into the  lungs every 6 (six) hours as needed for wheezing or shortness of breath.   acetaminophen (TYLENOL) 650 MG CR tablet Take 650 mg by mouth as needed for pain.   aspirin 325 MG tablet Take 325 mg by mouth daily.   carvedilol (COREG) 12.5 MG tablet TAKE 1 TABLET(12.5 MG) BY MOUTH TWICE DAILY WITH MEALS   Cholecalciferol 25 MCG (1000 UT) tablet Take 1,000 Units by mouth daily.    empagliflozin (JARDIANCE) 25 MG TABS tablet Take 1 tablet (25 mg total) by mouth daily before breakfast.   glucose blood (ONETOUCH VERIO) test strip Use to check blood sugars up to three times daily.   hydrALAZINE (APRESOLINE) 10 MG tablet TAKE 1 TABLET(10 MG) BY MOUTH THREE TIMES DAILY   losartan-hydrochlorothiazide (HYZAAR) 100-25 MG tablet TAKE 1 TABLET BY MOUTH DAILY   meclizine (ANTIVERT) 25 MG tablet Take 1 tablet (25 mg total) by mouth daily as needed for dizziness.   omeprazole (PRILOSEC) 40 MG capsule TAKE 1 CAPSULE(40 MG) BY MOUTH DAILY   OneTouch Delica Lancets 67E MISC Use to check blood sugars up to 3 times daily.   rosuvastatin (CRESTOR) 40 MG tablet TAKE 1 TABLET(40 MG) BY MOUTH DAILY   vitamin E 400 UNIT capsule Take 400 Units by mouth daily.   [DISCONTINUED] benzonatate (TESSALON PERLES) 100 MG capsule 1-2 capsules up to twice daily as needed for  cough.   No facility-administered encounter medications on file as of 06/03/2022.     Lab Results  Component Value Date   WBC 6.1 02/19/2022   HGB 12.8 02/19/2022   HCT 37.7 02/19/2022   PLT 172.0 02/19/2022   GLUCOSE 120 (H) 05/27/2022   CHOL 135 05/27/2022   TRIG 179.0 (H) 05/27/2022   HDL 41.60 05/27/2022   LDLDIRECT 71.0 02/19/2022   LDLCALC 57 05/27/2022   ALT 14 05/27/2022   AST 14 05/27/2022   NA 139 05/27/2022   K 4.2 05/27/2022   CL 104 05/27/2022   CREATININE 1.10 05/27/2022   BUN 23 05/27/2022   CO2 27 05/27/2022   TSH 5.33 05/27/2022   INR 0.9 07/13/2012   HGBA1C 6.9 (H) 05/27/2022   MICROALBUR 1.0 10/30/2021    MM 3D SCREEN BREAST  BILATERAL  Result Date: 12/18/2020 CLINICAL DATA:  Screening. EXAM: DIGITAL SCREENING BILATERAL MAMMOGRAM WITH TOMOSYNTHESIS AND CAD TECHNIQUE: Bilateral screening digital craniocaudal and mediolateral oblique mammograms were obtained. Bilateral screening digital breast tomosynthesis was performed. The images were evaluated with computer-aided detection. COMPARISON:  Previous exam(s). ACR Breast Density Category c: The breast tissue is heterogeneously dense, which may obscure small masses. FINDINGS: There are no findings suspicious for malignancy. IMPRESSION: No mammographic evidence of malignancy. A result letter of this screening mammogram will be mailed directly to the patient. RECOMMENDATION: Screening mammogram in one year. (Code:SM-B-01Y) BI-RADS CATEGORY  1: Negative. Electronically Signed   By: Ammie Ferrier M.D.   On: 12/18/2020 14:54  DG Bone Density  Result Date: 12/18/2020 EXAM: DUAL X-RAY ABSORPTIOMETRY (DXA) FOR BONE MINERAL DENSITY IMPRESSION: Dear Dr. Nicki Reaper, Your patient STARLYN DROGE completed a FRAX assessment on 12/18/2020 using the [place] (analysis version: 14.10) manufactured by [employer]. The following summarizes the results of our evaluation. PATIENT BIOGRAPHICAL: Name: Khari, Lett Patient ID: [id number] Birth Date: [birth year] Height:    64.0 in. Gender:     [age]    [age]       Weight:    195.6 lbs. Ethnicity:  White                            Exam Date: 12/18/2020 FRAX* RESULTS:  (version: 3.5) 10-year Probability of Fracture1 Major Osteoporotic Fracture2 Hip Fracture 17.8% 3.7% Population: [place] (Caucasian) Risk Factors: History of Fracture (Adult) Based on Femur (Right) Neck BMD 1 -The 10-year probability of fracture may be lower than reported if the patient has received treatment. 2 -Major Osteoporotic Fracture: Clinical Spine, Forearm, Hip or Shoulder *FRAX is a Materials engineer of the [employer] for Metabolic  Bone Disease, a [place] (WHO) [facility]. ASSESSMENT: The probability of a major osteoporotic fracture is 17.8% within the next ten years. The probability of a hip fracture is 3.7% within the next ten years. . Your patient Tesha Archambeau completed a BMD test on 12/18/2020 using the [place] (software version: 14.10) manufactured by [employer]. The following summarizes the results of our evaluation. Technologist: [facility] PATIENT BIOGRAPHICAL: Name: Narcissus, Detwiler Patient ID: [id number] Birth Date: [birth year] Height: 64.0 in. Gender: Female Exam Date: 12/18/2020 Weight: 195.6 lbs. Indications: Advanced Age, Caucasian, Diabetic, Early Menopause, Height Loss, History of Breast Cancer, History of Fracture (Adult), History of Radiation, Postmenopausal Fractures: Left finger, Left wrist Treatments: aspirin, Jardiance, Omeprazole, Vitamin D DENSITOMETRY RESULTS: Site  Region     Measured Date Measured Age WHO Classification Young Adult T-score BMD         %Change vs. Previous Significant Change (*) DualFemur Neck Right 12/18/2020 76.3 Osteopenia -1.7 0.801 g/cm2 -0.7% - DualFemur Neck Right 10/05/2017 73.1 Osteopenia -1.7 0.807 g/cm2 -1.3% - DualFemur Neck Right 02/01/2014 69.4 Osteopenia -1.6 0.818 g/cm2 - - DualFemur Total Mean 12/18/2020 76.3 Osteopenia -1.1 0.874 g/cm2 0.2% - DualFemur Total Mean 10/05/2017 73.1 Osteopenia -1.1 0.872 g/cm2 0.5% - DualFemur Total Mean 02/01/2014 69.4 Osteopenia -1.1 0.868 g/cm2 - - Right Forearm Radius 33% 12/18/2020 76.3 Normal -0.8 0.806 g/cm2 -8.1% Yes Right Forearm Radius 33% 02/01/2014 69.4 Normal 0.0 0.877 g/cm2 - - ASSESSMENT: The BMD measured at Femur Neck Right is 0.801 g/cm2 with a T-score of -1.7. This patient is considered osteopenic according to [facility]) criteria. The scan quality is good. Lumbar spine was not utilized due to advanced degenerative changes. Compared with prior study, there has  been no significant change in the total hip. World Pharmacologist [facility]) criteria for post-menopausal, Caucasian Women: Normal:                   T-score at or above -1 SD Osteopenia/low bone mass: T-score between -1 and -2.5 SD Osteoporosis:             T-score at or below -2.5 SD RECOMMENDATIONS: 1. All patients should optimize calcium and vitamin D intake. 2. Consider FDA-approved medical therapies in postmenopausal women and men aged 29 years and older, based on the following: a. A hip or vertebral(clinical or morphometric) fracture b. T-score < -2.5 at the femoral neck or spine after appropriate evaluation to exclude secondary causes c. Low bone mass (T-score between -1.0 and -2.5 at the femoral neck or spine) and a 10-year probability of a hip fracture > 3% or a 10-year probability of a major osteoporosis-related fracture > 20% based on the [place]-adapted WHO algorithm 3. Clinician judgment and/or patient preferences may indicate treatment for people with 10-year fracture probabilities above or below these levels FOLLOW-UP: People with diagnosed cases of osteoporosis or at high risk for fracture should have regular bone mineral density tests. For patients eligible for Medicare, routine testing is allowed once every 2 years. The testing frequency can be increased to one year for patients who have rapidly progressing disease, those who are receiving or discontinuing medical therapy to restore bone mass, or have additional risk factors. I have reviewed this report, and agree with the above findings. Mark A. Thornton Papas, M.D. [facility] Electronically Signed   By: Lavonia Dana M.D.   On: 12/18/2020 14:51       Assessment & Plan:   Problem List Items Addressed This Visit     Stress    Increased stress.  Has good support.  Overall appears to be doing better.  Follow.       Skin lesions    Skin lesion of back. Refer to dermatology.         Relevant Orders   Ambulatory referral to Dermatology    Peripheral vascular disease ([place])    Has seen [facility].  Continue aspirin and Crestor. Overdue f/u.  Have discussed.  Had reported will notify when agreeable.       Hypertension    Continue losartan/hctz, coreg and hydralazine.  Follow pressures.  Follow metabolic panel.      Relevant Orders   Basic metabolic panel   Hypercholesterolemia - Primary  On Crestor.  Low-cholesterol diet and exercise.  Follow lipid panel and liver function tests.   Lab Results  Component Value Date   CHOL 135 05/27/2022   HDL 41.60 05/27/2022   LDLCALC 57 05/27/2022   LDLDIRECT 71.0 02/19/2022   TRIG 179.0 (H) 05/27/2022   CHOLHDL 3 05/27/2022         Relevant Orders   Lipid panel   Hepatic function panel   History of colon polyps    Last colonoscopy January 2019 (Dr. Vira Agar).  Per patient recommended follow-up in 3 years.  Overdue follow-up. Have discussed.  Declines.       History of breast cancer    Completed femara.  Mammogram 12/2020 - Birads I.  Mammogram has been ordered.  Need to schedule.       GERD (gastroesophageal reflux disease)    No upper symptom reported.  On prilosec.       Diabetes mellitus with cardiac complication (HCC)    On jardiance.  A1c just checked 6.9.  improved.  Low carb diet and exercise.  Follow met b and a1c. Eye exam - 01/2022.       COPD (chronic obstructive pulmonary disease) (HCC)    Breathing overall stable.  No sob.  Follow.       CKD (chronic kidney disease), stage III (HCC)    Avoid anti-inflammatories..  Stay hydrated. Urine - no protein.  Follow metabolic panel. Continue ARB      Carotid artery disease ([place])    Evaluated by Dr Lucky Cowboy 12//13/19 - 1-39% carotid ultrasound.  Recommended f/u in 2 years.  See if agreeable for referral.       CAD (coronary artery disease)    Followed by cardiology.  No pain.  Continue risk factor modification.  Continue crestor.  Followed by cardiology.   Was seeing Dr Nehemiah Massed.  Request referral to Dr Saralyn Pilar for  f/u.       Aortic atherosclerosis (HCC)    Continue crestor.       Abnormal liver function test    Fatty liver found on previous CT.  Diet and exercise.  Follow liver function tests.       Other Visit Diagnoses     Type 2 diabetes mellitus with other circulatory complication, without long-term current use of insulin (HCC)       Relevant Orders   Hemoglobin A1c       Einar Pheasant, MD

## 2022-06-07 ENCOUNTER — Telehealth: Payer: Self-pay | Admitting: Internal Medicine

## 2022-06-07 ENCOUNTER — Encounter: Payer: Self-pay | Admitting: Internal Medicine

## 2022-06-07 DIAGNOSIS — I739 Peripheral vascular disease, unspecified: Secondary | ICD-10-CM

## 2022-06-07 DIAGNOSIS — I700 Atherosclerosis of aorta: Secondary | ICD-10-CM | POA: Insufficient documentation

## 2022-06-07 NOTE — Assessment & Plan Note (Signed)
Completed femara.  Mammogram 12/2020 - Birads I.  Mammogram has been ordered.  Need to schedule.

## 2022-06-07 NOTE — Assessment & Plan Note (Signed)
Continue crestor 

## 2022-06-07 NOTE — Assessment & Plan Note (Signed)
Avoid anti-inflammatories..  Stay hydrated. Urine - no protein.  Follow metabolic panel. Continue ARB 

## 2022-06-07 NOTE — Assessment & Plan Note (Signed)
Skin lesion of back. Refer to dermatology.

## 2022-06-07 NOTE — Assessment & Plan Note (Signed)
Increased stress.  Has good support.  Overall appears to be doing better.  Follow.

## 2022-06-07 NOTE — Assessment & Plan Note (Signed)
Last colonoscopy January 2019 (Dr. Vira Agar).  Per patient recommended follow-up in 3 years.  Overdue follow-up. Have discussed.  Declines.

## 2022-06-07 NOTE — Assessment & Plan Note (Signed)
On jardiance.  A1c just checked 6.9.  improved.  Low carb diet and exercise.  Follow met b and a1c. Eye exam - 01/2022.  

## 2022-06-07 NOTE — Assessment & Plan Note (Signed)
Continue losartan/hctz, coreg and hydralazine.  Follow pressures.  Follow metabolic panel.

## 2022-06-07 NOTE — Telephone Encounter (Signed)
Was previously seeing vascular ([facility]) - f/u carotid arteries and peripheral artery disease (legs).  She is overdue.  If agreeable, needs referral back to [facility].  Also, was seeing Dr Nehemiah Massed.  He has moved.  Pt wants to be established with Dr Saralyn Pilar.  Will need f/u appt scheduled in a few months - to get established in with him.  Thanks

## 2022-06-07 NOTE — Assessment & Plan Note (Addendum)
On Crestor.  Low-cholesterol diet and exercise.  Follow lipid panel and liver function tests.   Lab Results  Component Value Date   CHOL 135 05/27/2022   HDL 41.60 05/27/2022   LDLCALC 57 05/27/2022   LDLDIRECT 71.0 02/19/2022   TRIG 179.0 (H) 05/27/2022   CHOLHDL 3 05/27/2022    

## 2022-06-07 NOTE — Assessment & Plan Note (Signed)
Followed by cardiology.  No pain.  Continue risk factor modification.  Continue crestor.  Followed by cardiology.   Was seeing Dr Nehemiah Massed.  Request referral to Dr Saralyn Pilar for f/u.

## 2022-06-07 NOTE — Assessment & Plan Note (Signed)
Fatty liver found on previous CT.  Diet and exercise.  Follow liver function tests.

## 2022-06-07 NOTE — Assessment & Plan Note (Signed)
Breathing overall stable.  No sob.  Follow.  

## 2022-06-07 NOTE — Assessment & Plan Note (Signed)
No upper symptom reported.  On prilosec.  

## 2022-06-07 NOTE — Assessment & Plan Note (Signed)
Evaluated by Dr Lucky Cowboy 12//13/19 - 1-39% carotid ultrasound.  Recommended f/u in 2 years.  See if agreeable for referral.

## 2022-06-07 NOTE — Assessment & Plan Note (Signed)
Has seen [facility].  Continue aspirin and Crestor. Overdue f/u.  Have discussed.  Had reported will notify when agreeable.  

## 2022-06-11 NOTE — Telephone Encounter (Signed)
LMTCB

## 2022-06-16 NOTE — Telephone Encounter (Signed)
LMTCB

## 2022-06-18 NOTE — Telephone Encounter (Signed)
Patient agreeable to see [facility]. Referral placed for her to be scheduled for f/u with Dr Lucky Cowboy. Also agreeable to cardiology. Wants to see Dr Clayborn Bigness. Advised will call her with cardiology appt,

## 2022-06-18 NOTE — Telephone Encounter (Signed)
Cardiology appt scheduled for 6/17 at 3. Pt aware.

## 2022-06-18 NOTE — Addendum Note (Signed)
Addended by: Roetta Sessions D on: 06/18/2022 10:21 AM   Modules accepted: Orders

## 2022-06-23 ENCOUNTER — Ambulatory Visit
Admission: RE | Admit: 2022-06-23 | Discharge: 2022-06-23 | Disposition: A | Discharge status: Home / Self Care | Payer: Medicare Other | Source: Ambulatory Visit | Attending: Internal Medicine | Admitting: Internal Medicine

## 2022-06-23 DIAGNOSIS — Z1231 Encounter for screening mammogram for malignant neoplasm of breast: Secondary | ICD-10-CM | POA: Diagnosis not present

## 2022-06-24 DIAGNOSIS — H2511 Age-related nuclear cataract, right eye: Secondary | ICD-10-CM | POA: Diagnosis not present

## 2022-06-25 ENCOUNTER — Telehealth: Payer: Self-pay

## 2022-06-25 DIAGNOSIS — H61039 Chondritis of external ear, unspecified ear: Secondary | ICD-10-CM | POA: Diagnosis not present

## 2022-06-25 DIAGNOSIS — H6123 Impacted cerumen, bilateral: Secondary | ICD-10-CM | POA: Diagnosis not present

## 2022-06-25 NOTE — Telephone Encounter (Signed)
Yes, can see about work in appt next week.

## 2022-06-25 NOTE — Telephone Encounter (Signed)
Pt sched for 2/21 at 11am

## 2022-06-25 NOTE — Telephone Encounter (Signed)
We received a faxed request for a medical clearance for patient from [facility] today.  I sent a copy to Dr. Einar Pheasant via her folder on S drive and hand-delivered a copy to Roetta Sessions, LPN.

## 2022-06-25 NOTE — Telephone Encounter (Signed)
Received medical clearance form from [facility] for patient having cataract surgery 3/8. Do you want to see her for pre op? Last OV was 1/24

## 2022-06-29 NOTE — Telephone Encounter (Signed)
[facility] called back to F/U with the clearance. They aware that provider need to see her before her surgery.

## 2022-06-29 NOTE — Telephone Encounter (Signed)
Noted patient has appt 07/01/22

## 2022-07-01 ENCOUNTER — Encounter: Payer: Self-pay | Admitting: Internal Medicine

## 2022-07-01 ENCOUNTER — Ambulatory Visit (INDEPENDENT_AMBULATORY_CARE_PROVIDER_SITE_OTHER): Payer: Medicare Other | Admitting: Internal Medicine

## 2022-07-01 VITALS — BP 128/74 | HR 60 | Temp 98.2°F | Resp 16 | Ht 65.0 in | Wt 188.0 lb

## 2022-07-01 DIAGNOSIS — I700 Atherosclerosis of aorta: Secondary | ICD-10-CM | POA: Diagnosis not present

## 2022-07-01 DIAGNOSIS — G4733 Obstructive sleep apnea (adult) (pediatric): Secondary | ICD-10-CM | POA: Diagnosis not present

## 2022-07-01 DIAGNOSIS — E1159 Type 2 diabetes mellitus with other circulatory complications: Secondary | ICD-10-CM | POA: Diagnosis not present

## 2022-07-01 DIAGNOSIS — R7989 Other specified abnormal findings of blood chemistry: Secondary | ICD-10-CM

## 2022-07-01 DIAGNOSIS — N1831 Chronic kidney disease, stage 3a: Secondary | ICD-10-CM | POA: Diagnosis not present

## 2022-07-01 DIAGNOSIS — J449 Chronic obstructive pulmonary disease, unspecified: Secondary | ICD-10-CM | POA: Diagnosis not present

## 2022-07-01 DIAGNOSIS — I779 Disorder of arteries and arterioles, unspecified: Secondary | ICD-10-CM

## 2022-07-01 DIAGNOSIS — K219 Gastro-esophageal reflux disease without esophagitis: Secondary | ICD-10-CM

## 2022-07-01 DIAGNOSIS — I25119 Atherosclerotic heart disease of native coronary artery with unspecified angina pectoris: Secondary | ICD-10-CM

## 2022-07-01 DIAGNOSIS — I739 Peripheral vascular disease, unspecified: Secondary | ICD-10-CM

## 2022-07-01 DIAGNOSIS — Z01818 Encounter for other preprocedural examination: Secondary | ICD-10-CM

## 2022-07-01 DIAGNOSIS — E7800 Pure hypercholesterolemia, unspecified: Secondary | ICD-10-CM

## 2022-07-01 DIAGNOSIS — I10 Essential (primary) hypertension: Secondary | ICD-10-CM | POA: Diagnosis not present

## 2022-07-01 NOTE — Progress Notes (Signed)
Subjective:    [name], female    DOB: 11-26-1945, 77 y.o.   MRN: [id number]  Patient here for  Chief Complaint  Patient presents with   Pre-op Exam    HPI Here for pre op evaluation.  Eye surgery (cataract) - 07/17/22. Was asked to see her for clearance - blood pressure elevated and she had mentioned some irregularity - heart beat.  On questioning her today, she feels she is doing relatively well.  Denies any chest pain or sob.  No increased cough or congestion. No acid reflux.  No abdominal pain.  No bowel issues reported.  Questioned her regarding blood pressure.  States has been averaging 130s/50-60s.  Questioned regarding irregularity of heart beat.  States has noticed previously.  No current issues.  Has been seeing cardiology.  Dr Nehemiah Massed moved.  Discussed cardiac evaluation prior to surgery.  Agreeable.  Needs to continue  CPAP.    Past Medical History:  Diagnosis Date   Arthritis    Breast cancer ([place])    Breast cancer, left ([place]) 2016   LT LUMPECTOMY - TI, NO, MO - IDC, ER/PR pos, Her 2 neg, Rad tx's.    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   CHF (congestive heart failure) (HCC)    COPD (chronic obstructive pulmonary disease) (HCC)    Coronary artery disease    Depression    Diffuse cystic mastopathy 2013   DVT (deep venous thrombosis) (HCC)    Dyspnea    Elevated TSH    Family history of adverse reaction to anesthesia    Pt stated that son had a seizure with a combination of anesthesia and pain medication."   Fatty liver    GERD (gastroesophageal reflux disease)    Glaucoma    High cholesterol    History of chicken pox    Hyperglycemia    Hyperlipidemia    Hypertension    LVH (left ventricular hypertrophy)    PAC (premature atrial contraction)    Palpitations    Peripheral vascular disease ([place])    Personal history of radiation therapy 2016   BREAST CA    Pneumonia March 2014   Skin cancer 2013   Sleep apnea    wears CPAP set at 2.5   Wears glasses    Past Surgical History:  Procedure Laterality Date   BACK SURGERY  1986   ruptured disc   BREAST BIOPSY Left 2008   NEG   BREAST BIOPSY Left 08-20-14   POS   BREAST BIOPSY Right 2007   NEG   BREAST EXCISIONAL BIOPSY Left 1984   NEG   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 HER2 negative.  Wide excision with SLN biopsy.  MammoSite radiation   CARPAL TUNNEL RELEASE     CHOLECYSTECTOMY  2006   COLONOSCOPY  2010   Dr. Jamal Collin   COLONOSCOPY WITH PROPOFOL N/A 05/17/2017   Procedure: COLONOSCOPY WITH PROPOFOL;  Surgeon: Manya Silvas, MD;  Location: [facility] ENDOSCOPY;  Service: Endoscopy;  Laterality: N/A;   DILATION AND CURETTAGE OF UTERUS     ESOPHAGOGASTRODUODENOSCOPY (EGD) WITH PROPOFOL N/A 05/17/2017   Procedure: ESOPHAGOGASTRODUODENOSCOPY (EGD) WITH PROPOFOL;  Surgeon: Manya Silvas, MD;  Location: [facility] ENDOSCOPY;  Service: Endoscopy;  Laterality: N/A;   LAPAROTOMY FOR REMOVAL TUMOR LUMBAR PLEXES  Leg stent  2011   MOLE REMOVAL  2013   15 removed   POSTERIOR CERVICAL LAMINECTOMY Left 10/15/2015   Procedure: Left Cervical four- five Hemilaminectomy/Remove mass;  Surgeon: Leeroy Cha, MD;  Location: [place] NEURO ORS;  Service: Neurosurgery;  Laterality: Left;  Left C4-5 Hemilaminectomy/Remove mass   Family History  Problem Relation Age of Onset   Cancer Father        Prostate   Diabetes Father    Cerebrovascular Accident Father    Hypertension Mother    AAA (abdominal aortic aneurysm) Mother    Hyperlipidemia Other        Parent   Miscarriages / Stillbirths Other        Parent   Hypertension Other        parent   Heart disease Other        Parent   Breast cancer Maternal Aunt 72   Social History   Socioeconomic History   Marital status: Married    Spouse name: Not on file   Number of children: 3    Years of education: 12   Highest education level: Not on file  Occupational History   Occupation: Caregiver   Tobacco Use   Smoking status: Never   Smokeless tobacco: Never  Substance and Sexual Activity   Alcohol use: No    Alcohol/week: 0.0 standard drinks of alcohol   Drug use: No   Sexual activity: Never  Other Topics Concern   Not on file  Social History Narrative   Regular exercise-mo   Caffeine Use-no   Social Determinants of Health   Financial Resource Strain: Medium Risk (07/14/2021)   Overall Financial Resource Strain (CARDIA)    Difficulty of Paying Living Expenses: Somewhat hard  Food Insecurity: No Food Insecurity (11/24/2021)   Hunger Vital Sign    Worried About Running Out of Food in the Last Year: Never true    Ran Out of Food in the Last Year: Never true  Transportation Needs: No Transportation Needs (11/24/2021)   PRAPARE - Hydrologist (Medical): No    Lack of Transportation (Non-Medical): No  Physical Activity: Sufficiently Active (11/24/2021)   Exercise Vital Sign    Days of Exercise per Week: 7 days    Minutes of Exercise per Session: 30 min  Stress: No Stress Concern Present (11/24/2021)   [place]    Feeling of Stress : Only a little  Social Connections: Socially Integrated (11/24/2021)   Social Connection and Isolation Panel [NHANES]    Frequency of Communication with Friends and Family: More than three times a week    Frequency of Social Gatherings with Friends and Family: More than three times a week    Attends Religious Services: More than 4 times per year    Active Member of [employer] or Organizations: Yes    Attends Music therapist: More than 4 times per year    Marital Status: Married     Review of Systems  Constitutional:  Negative for appetite change and unexpected weight change.  HENT:  Negative for congestion and sinus pressure.    Respiratory:  Negative for cough, chest tightness and shortness of breath.   Cardiovascular:  Negative for chest pain and palpitations.  Gastrointestinal:  Negative for abdominal pain, diarrhea, nausea and vomiting.  Genitourinary:  Negative for difficulty urinating and dysuria.  Musculoskeletal:  Negative for joint swelling and myalgias.  Skin:  Negative for  color change and rash.  Neurological:  Negative for dizziness and headaches.  Psychiatric/Behavioral:  Negative for agitation and dysphoric mood.        Objective:     BP 128/74   Pulse 60   Temp 98.2 F (36.8 C)   Resp 16   Ht '5\' 5"'$  (1.651 m)   Wt 188 lb (85.3 kg)   SpO2 97%   BMI 31.28 kg/m  Wt Readings from Last 3 Encounters:  07/01/22 188 lb (85.3 kg)  06/03/22 185 lb (83.9 kg)  02/24/22 185 lb 3.2 oz (84 kg)    Physical Exam Vitals reviewed.  Constitutional:      General: She is not in acute distress.    Appearance: Normal appearance.  HENT:     Head: Normocephalic and atraumatic.     Right Ear: External ear normal.     Left Ear: External ear normal.  Eyes:     General: No scleral icterus.       Right eye: No discharge.        Left eye: No discharge.     Conjunctiva/sclera: Conjunctivae normal.  Neck:     Thyroid: No thyromegaly.  Cardiovascular:     Rate and Rhythm: Normal rate and regular rhythm.  Pulmonary:     Effort: No respiratory distress.     Breath sounds: Normal breath sounds. No wheezing.  Abdominal:     General: Bowel sounds are normal.     Palpations: Abdomen is soft.     Tenderness: There is no abdominal tenderness.  Musculoskeletal:        General: No swelling or tenderness.     Cervical back: Neck supple. No tenderness.  Lymphadenopathy:     Cervical: No cervical adenopathy.  Skin:    Findings: No erythema or rash.  Neurological:     Mental Status: She is alert.  Psychiatric:        Mood and Affect: Mood normal.        Behavior: Behavior normal.      Outpatient  Encounter Medications as of 07/01/2022  Medication Sig   albuterol (VENTOLIN HFA) 108 (90 Base) MCG/ACT inhaler Inhale 2 puffs into the lungs every 6 (six) hours as needed for wheezing or shortness of breath.   acetaminophen (TYLENOL) 650 MG CR tablet Take 650 mg by mouth as needed for pain.   aspirin 325 MG tablet Take 325 mg by mouth daily.   carvedilol (COREG) 12.5 MG tablet TAKE 1 TABLET(12.5 MG) BY MOUTH TWICE DAILY WITH MEALS   Cholecalciferol 25 MCG (1000 UT) tablet Take 1,000 Units by mouth daily.    empagliflozin (JARDIANCE) 25 MG TABS tablet Take 1 tablet (25 mg total) by mouth daily before breakfast.   glucose blood (ONETOUCH VERIO) test strip Use to check blood sugars up to three times daily.   hydrALAZINE (APRESOLINE) 10 MG tablet TAKE 1 TABLET(10 MG) BY MOUTH THREE TIMES DAILY   losartan-hydrochlorothiazide (HYZAAR) 100-25 MG tablet TAKE 1 TABLET BY MOUTH DAILY   meclizine (ANTIVERT) 25 MG tablet Take 1 tablet (25 mg total) by mouth daily as needed for dizziness.   omeprazole (PRILOSEC) 40 MG capsule TAKE 1 CAPSULE(40 MG) BY MOUTH DAILY   OneTouch Delica Lancets 99991111 MISC Use to check blood sugars up to 3 times daily.   rosuvastatin (CRESTOR) 40 MG tablet TAKE 1 TABLET(40 MG) BY MOUTH DAILY   vitamin E 400 UNIT capsule Take 400 Units by mouth daily.   No facility-administered encounter medications on  file as of 07/01/2022.     Lab Results  Component Value Date   WBC 6.1 02/19/2022   HGB 12.8 02/19/2022   HCT 37.7 02/19/2022   PLT 172.0 02/19/2022   GLUCOSE 120 (H) 05/27/2022   CHOL 135 05/27/2022   TRIG 179.0 (H) 05/27/2022   HDL 41.60 05/27/2022   LDLDIRECT 71.0 02/19/2022   LDLCALC 57 05/27/2022   ALT 14 05/27/2022   AST 14 05/27/2022   NA 139 05/27/2022   K 4.2 05/27/2022   CL 104 05/27/2022   CREATININE 1.10 05/27/2022   BUN 23 05/27/2022   CO2 27 05/27/2022   TSH 5.33 05/27/2022   INR 0.9 07/13/2012   HGBA1C 6.9 (H) 05/27/2022   MICROALBUR 1.0 10/30/2021     MM 3D SCREEN BREAST BILATERAL  Result Date: 06/25/2022 CLINICAL DATA:  Screening. EXAM: DIGITAL SCREENING BILATERAL MAMMOGRAM WITH TOMOSYNTHESIS AND CAD TECHNIQUE: Bilateral screening digital craniocaudal and mediolateral oblique mammograms were obtained. Bilateral screening digital breast tomosynthesis was performed. The images were evaluated with computer-aided detection. COMPARISON:  Previous exam(s). ACR Breast Density Category c: The breasts are heterogeneously dense, which may obscure small masses. FINDINGS: There are no findings suspicious for malignancy. IMPRESSION: No mammographic evidence of malignancy. A result letter of this screening mammogram will be mailed directly to the patient. RECOMMENDATION: Screening mammogram in one year. (Code:SM-B-01Y) BI-RADS CATEGORY  1: Negative. Electronically Signed   By: Kristopher Oppenheim M.D.   On: 06/25/2022 10:05       Assessment & Plan:  Pre-op evaluation Assessment & Plan: Has been followed by cardiology.  No pain.  Continue risk factor modification.  Continue crestor.   Was seeing Dr Nehemiah Massed.  He has moved.  Blood pressure today as outlined. Controlled. EKG - SR/SB with ventricular rate 54.  No acute ischemic changes.  No irregularity currently.  Given history and given followed by cardiology, will have them give cardiac recs - for pre op - prior to eye surgery.  Ophthalmology notified.  Appt with cardiology 07/14/22 - 1:30.    Orders: -     EKG 12-Lead  Abnormal liver function test Assessment & Plan: Fatty liver found on previous CT.  Diet and exercise.  Follow liver function tests. Recent labs revealed normal liver function tests.     Aortic atherosclerosis ([place]) Assessment & Plan: Continue crestor.    Coronary artery disease involving native coronary artery of native heart with angina pectoris [facility]) Assessment & Plan: Has been followed by cardiology.  No pain.  Continue risk factor modification.  Continue crestor.   Was seeing Dr  Nehemiah Massed.  He has moved.  Blood pressure today as outlined. EKG - SR/SB with ventricular rate 54.  No acute ischemic changes.  No irregularity currently.  Given history and given followed by cardiology, will have them give cardiac recs - for pre op - prior to eye surgery.     Bilateral carotid artery disease, unspecified type [facility]) Assessment & Plan: Evaluated by Dr Lucky Cowboy 12//13/19 - 1-39% carotid ultrasound.  Overdue f/u.  Continue risk factor modification - blood pressure control and statin.     Stage 3a chronic kidney disease (HCC) Assessment & Plan: Avoid anti-inflammatories..  Stay hydrated. Urine - no protein.  Follow metabolic panel. Continue ARB   Chronic obstructive pulmonary disease, unspecified COPD type ([place]) Assessment & Plan: Breathing overall stable.  No sob.  Follow.    Diabetes mellitus with cardiac complication [facility]) Assessment & Plan: On jardiance.  A1c just checked 6.9.  improved.  Low carb diet and exercise.  Follow met b and a1c. Eye exam - 01/2022.    Gastroesophageal reflux disease without esophagitis Assessment & Plan: No upper symptom reported.  On prilosec.    Hypercholesterolemia Assessment & Plan: On Crestor.  Low-cholesterol diet and exercise.  Follow lipid panel and liver function tests.   Lab Results  Component Value Date   CHOL 135 05/27/2022   HDL 41.60 05/27/2022   LDLCALC 57 05/27/2022   LDLDIRECT 71.0 02/19/2022   TRIG 179.0 (H) 05/27/2022   CHOLHDL 3 05/27/2022      Primary hypertension Assessment & Plan: Continue losartan/hctz, coreg and hydralazine.  Follow pressures.  Follow metabolic panel. Blood pressures as outlined.  Overall improved from check at eye MD.  No changes in medication recommended today.  Will need close intra op and post op monitoring of heart rate and blood pressure to avoid extremes.     OSA on CPAP Assessment & Plan: Continue cpap.    Peripheral vascular disease ([place]) Assessment & Plan: Has seen [facility].   Continue aspirin and Crestor. Overdue f/u.  Have discussed.  Had reported will notify when agreeable.       Einar Pheasant, MD

## 2022-07-06 NOTE — Telephone Encounter (Signed)
Joelene Millin from [facility] called in staying that they would like to check the status of pt clearance that were faxed to [place], her surgery its next Friday. She's available '@[phone]'$ [phone].

## 2022-07-07 ENCOUNTER — Encounter: Payer: Self-pay | Admitting: Internal Medicine

## 2022-07-08 NOTE — Telephone Encounter (Signed)
We saw her for pre-op 2/21 and pt seeing cardiology June 2024. Dr Nehemiah Massed recommended 47mfollow up at last appt with him.

## 2022-07-08 NOTE — Telephone Encounter (Signed)
Form completed and placed in box. Cardiology to see her 07/14/22 at 1:30 [place].  Will have cardiology give recs prior to her surgery.  Please can and notify eye center.  Can fax my office note.

## 2022-07-10 DIAGNOSIS — G4733 Obstructive sleep apnea (adult) (pediatric): Secondary | ICD-10-CM | POA: Diagnosis not present

## 2022-07-10 HISTORY — PX: CATARACT EXTRACTION: SUR2

## 2022-07-10 NOTE — Telephone Encounter (Signed)
The eye center called wanting an update on the medical release form for the pt

## 2022-07-10 NOTE — Telephone Encounter (Signed)
FYI Dr Nicki Reaper-   LM for Maudie Mercury at the [facility] to let her know that form and note will be faxed but patient will be seeing cardiology 3/5 for cardiac clearance.

## 2022-07-12 ENCOUNTER — Encounter: Payer: Self-pay | Admitting: Internal Medicine

## 2022-07-12 NOTE — Assessment & Plan Note (Signed)
Has been followed by cardiology.  No pain.  Continue risk factor modification.  Continue crestor.   Was seeing Dr Nehemiah Massed.  He has moved.  Blood pressure today as outlined. Controlled. EKG - SR/SB with ventricular rate 54.  No acute ischemic changes.  No irregularity currently.  Given history and given followed by cardiology, will have them give cardiac recs - for pre op - prior to eye surgery.  Ophthalmology notified.  Appt with cardiology 07/14/22 - 1:30.

## 2022-07-12 NOTE — Assessment & Plan Note (Signed)
Breathing overall stable.  No sob.  Follow.

## 2022-07-12 NOTE — Assessment & Plan Note (Signed)
Continue crestor 

## 2022-07-12 NOTE — Assessment & Plan Note (Signed)
Continue losartan/hctz, coreg and hydralazine.  Follow pressures.  Follow metabolic panel. Blood pressures as outlined.  Overall improved from check at eye MD.  No changes in medication recommended today.  Will need close intra op and post op monitoring of heart rate and blood pressure to avoid extremes.

## 2022-07-12 NOTE — Assessment & Plan Note (Signed)
No upper symptom reported.  On prilosec.

## 2022-07-12 NOTE — Assessment & Plan Note (Signed)
Has seen [facility].  Continue aspirin and Crestor. Overdue f/u.  Have discussed.  Had reported will notify when agreeable.

## 2022-07-12 NOTE — Telephone Encounter (Signed)
Please fax EKG and office note to ophthalmology.  Also, confirm pt aware of appt 07/14/22 - 1:30 [facility]) with Estill Bamberg.

## 2022-07-12 NOTE — Assessment & Plan Note (Signed)
Has been followed by cardiology.  No pain.  Continue risk factor modification.  Continue crestor.   Was seeing Dr Nehemiah Massed.  He has moved.  Blood pressure today as outlined. EKG - SR/SB with ventricular rate 54.  No acute ischemic changes.  No irregularity currently.  Given history and given followed by cardiology, will have them give cardiac recs - for pre op - prior to eye surgery.

## 2022-07-12 NOTE — Assessment & Plan Note (Signed)
On jardiance.  A1c just checked 6.9.  improved.  Low carb diet and exercise.  Follow met b and a1c. Eye exam - 01/2022.

## 2022-07-12 NOTE — Assessment & Plan Note (Signed)
Evaluated by Dr Lucky Cowboy 12//13/19 - 1-39% carotid ultrasound.  Overdue f/u.  Continue risk factor modification - blood pressure control and statin.

## 2022-07-12 NOTE — Assessment & Plan Note (Signed)
Fatty liver found on previous CT.  Diet and exercise.  Follow liver function tests. Recent labs revealed normal liver function tests.

## 2022-07-12 NOTE — Assessment & Plan Note (Signed)
On Crestor.  Low-cholesterol diet and exercise.  Follow lipid panel and liver function tests.   Lab Results  Component Value Date   CHOL 135 05/27/2022   HDL 41.60 05/27/2022   LDLCALC 57 05/27/2022   LDLDIRECT 71.0 02/19/2022   TRIG 179.0 (H) 05/27/2022   CHOLHDL 3 05/27/2022

## 2022-07-12 NOTE — Assessment & Plan Note (Signed)
Avoid anti-inflammatories..  Stay hydrated. Urine - no protein.  Follow metabolic panel. Continue ARB

## 2022-07-12 NOTE — Assessment & Plan Note (Signed)
Continue cpap.  

## 2022-07-13 NOTE — Telephone Encounter (Signed)
Faxed EKG, office note and pre op form. Called patient to confirm aware of cardio appt.

## 2022-07-14 DIAGNOSIS — I499 Cardiac arrhythmia, unspecified: Secondary | ICD-10-CM | POA: Diagnosis not present

## 2022-07-14 DIAGNOSIS — I779 Disorder of arteries and arterioles, unspecified: Secondary | ICD-10-CM | POA: Diagnosis not present

## 2022-07-14 DIAGNOSIS — I491 Atrial premature depolarization: Secondary | ICD-10-CM | POA: Diagnosis not present

## 2022-07-14 DIAGNOSIS — I493 Ventricular premature depolarization: Secondary | ICD-10-CM | POA: Diagnosis not present

## 2022-07-14 DIAGNOSIS — I6523 Occlusion and stenosis of bilateral carotid arteries: Secondary | ICD-10-CM | POA: Diagnosis not present

## 2022-07-14 DIAGNOSIS — I119 Hypertensive heart disease without heart failure: Secondary | ICD-10-CM | POA: Diagnosis not present

## 2022-07-14 DIAGNOSIS — I10 Essential (primary) hypertension: Secondary | ICD-10-CM | POA: Diagnosis not present

## 2022-07-14 DIAGNOSIS — E782 Mixed hyperlipidemia: Secondary | ICD-10-CM | POA: Diagnosis not present

## 2022-07-14 DIAGNOSIS — G4733 Obstructive sleep apnea (adult) (pediatric): Secondary | ICD-10-CM | POA: Diagnosis not present

## 2022-07-17 DIAGNOSIS — H269 Unspecified cataract: Secondary | ICD-10-CM | POA: Diagnosis not present

## 2022-07-17 DIAGNOSIS — H2511 Age-related nuclear cataract, right eye: Secondary | ICD-10-CM | POA: Diagnosis not present

## 2022-07-31 DIAGNOSIS — H2512 Age-related nuclear cataract, left eye: Secondary | ICD-10-CM | POA: Diagnosis not present

## 2022-07-31 DIAGNOSIS — H269 Unspecified cataract: Secondary | ICD-10-CM | POA: Diagnosis not present

## 2022-08-13 ENCOUNTER — Other Ambulatory Visit: Payer: Self-pay | Admitting: Internal Medicine

## 2022-08-27 DIAGNOSIS — L853 Xerosis cutis: Secondary | ICD-10-CM | POA: Diagnosis not present

## 2022-08-27 DIAGNOSIS — L821 Other seborrheic keratosis: Secondary | ICD-10-CM | POA: Diagnosis not present

## 2022-08-27 DIAGNOSIS — L538 Other specified erythematous conditions: Secondary | ICD-10-CM | POA: Diagnosis not present

## 2022-08-27 DIAGNOSIS — L570 Actinic keratosis: Secondary | ICD-10-CM | POA: Diagnosis not present

## 2022-08-27 DIAGNOSIS — L820 Inflamed seborrheic keratosis: Secondary | ICD-10-CM | POA: Diagnosis not present

## 2022-08-27 DIAGNOSIS — R208 Other disturbances of skin sensation: Secondary | ICD-10-CM | POA: Diagnosis not present

## 2022-08-27 DIAGNOSIS — D225 Melanocytic nevi of trunk: Secondary | ICD-10-CM | POA: Diagnosis not present

## 2022-08-27 DIAGNOSIS — D2262 Melanocytic nevi of left upper limb, including shoulder: Secondary | ICD-10-CM | POA: Diagnosis not present

## 2022-08-27 DIAGNOSIS — L298 Other pruritus: Secondary | ICD-10-CM | POA: Diagnosis not present

## 2022-09-07 ENCOUNTER — Other Ambulatory Visit (INDEPENDENT_AMBULATORY_CARE_PROVIDER_SITE_OTHER): Payer: Self-pay | Admitting: Nurse Practitioner

## 2022-09-07 DIAGNOSIS — I6523 Occlusion and stenosis of bilateral carotid arteries: Secondary | ICD-10-CM

## 2022-09-07 DIAGNOSIS — I739 Peripheral vascular disease, unspecified: Secondary | ICD-10-CM

## 2022-09-09 HISTORY — PX: CATARACT EXTRACTION: SUR2

## 2022-09-10 ENCOUNTER — Encounter (INDEPENDENT_AMBULATORY_CARE_PROVIDER_SITE_OTHER): Payer: Self-pay | Admitting: Nurse Practitioner

## 2022-09-10 ENCOUNTER — Ambulatory Visit (INDEPENDENT_AMBULATORY_CARE_PROVIDER_SITE_OTHER): Payer: Medicare Other

## 2022-09-10 ENCOUNTER — Ambulatory Visit (INDEPENDENT_AMBULATORY_CARE_PROVIDER_SITE_OTHER): Payer: Medicare Other | Admitting: Nurse Practitioner

## 2022-09-10 VITALS — BP 149/79 | HR 51 | Resp 16 | Ht 65.5 in | Wt 187.4 lb

## 2022-09-10 DIAGNOSIS — E1159 Type 2 diabetes mellitus with other circulatory complications: Secondary | ICD-10-CM | POA: Diagnosis not present

## 2022-09-10 DIAGNOSIS — I6523 Occlusion and stenosis of bilateral carotid arteries: Secondary | ICD-10-CM | POA: Diagnosis not present

## 2022-09-10 DIAGNOSIS — I739 Peripheral vascular disease, unspecified: Secondary | ICD-10-CM

## 2022-09-10 DIAGNOSIS — I10 Essential (primary) hypertension: Secondary | ICD-10-CM | POA: Diagnosis not present

## 2022-09-10 DIAGNOSIS — I779 Disorder of arteries and arterioles, unspecified: Secondary | ICD-10-CM

## 2022-09-14 LAB — VAS US ABI WITH/WO TBI
Left ABI: 1.25
Right ABI: 1.28

## 2022-09-29 ENCOUNTER — Encounter (INDEPENDENT_AMBULATORY_CARE_PROVIDER_SITE_OTHER): Payer: Self-pay | Admitting: Nurse Practitioner

## 2022-09-29 NOTE — Progress Notes (Signed)
Subjective:    [name], female    DOB: Oct 20, 1945, 77 y.o.   MRN: [id number] Chief Complaint  Patient presents with   New Patient (Initial Visit)    Ref Lorin Picket consult abi and carotid    Patient returns today in follow up of her vascular issues.  No major complaints or problems since her last visit.  No focal neurologic symptoms.  Carotid duplex today continues to show mild 1 to 39% carotid artery stenosis bilaterally. She continues to walk reasonably well.  She is over a decade status post iliac intervention for peripheral arterial disease.  Her ABIs today are normal at 1.28 on the right and 1.25 on the left with triphasic waveforms.     Review of Systems  Eyes:  Negative for visual disturbance.  All other systems reviewed and are negative.      Objective:   Physical Exam Vitals reviewed.  HENT:     Head: Normocephalic.  Neck:     Vascular: No carotid bruit.  Cardiovascular:     Rate and Rhythm: Normal rate.     Pulses:          Dorsalis pedis pulses are detected w/ Doppler on the right side and detected w/ Doppler on the left side.       Posterior tibial pulses are detected w/ Doppler on the right side and detected w/ Doppler on the left side.  Pulmonary:     Effort: Pulmonary effort is normal.  Skin:    General: Skin is warm and dry.  Neurological:     Mental Status: She is alert and oriented to person, place, and time.  Psychiatric:        Mood and Affect: Mood normal.        Behavior: Behavior normal.        Thought Content: Thought content normal.        Judgment: Judgment normal.     BP (!) 149/79 (BP Location: Right Arm)   Pulse (!) 51   Resp 16   Ht 5' 5.5" (1.664 m)   Wt 187 lb 6.4 oz (85 kg)   BMI 30.71 kg/m   Past Medical History:  Diagnosis Date   Arthritis    Breast cancer (HCC)    Breast cancer, left (HCC) 2016   LT LUMPECTOMY - TI, NO, MO - IDC, ER/PR pos, Her 2 neg, Rad tx's.    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   CHF (congestive heart failure) (HCC)    COPD (chronic obstructive pulmonary disease) (HCC)    Coronary artery disease    Depression    Diffuse cystic mastopathy 2013   DVT (deep venous thrombosis) (HCC)    Dyspnea    Elevated TSH    Family history of adverse reaction to anesthesia    Pt stated that son had a seizure with a combination of anesthesia and pain medication."   Fatty liver    GERD (gastroesophageal reflux disease)    Glaucoma    High cholesterol    History of chicken pox    Hyperglycemia    Hyperlipidemia    Hypertension    LVH (left ventricular hypertrophy)    PAC (premature atrial contraction)    Palpitations    Peripheral vascular disease (HCC)    Personal history of radiation therapy 2016   BREAST CA   Pneumonia March 2014  Skin cancer 2013   Sleep apnea    wears CPAP set at 2.5   Wears glasses     Social History   Socioeconomic History   Marital status: Married    Spouse name: Not on file   Number of children: 3   Years of education: 12   Highest education level: Not on file  Occupational History   Occupation: Caregiver   Tobacco Use   Smoking status: Never   Smokeless tobacco: Never  Substance and Sexual Activity   Alcohol use: No    Alcohol/week: 0.0 standard drinks of alcohol   Drug use: No   Sexual activity: Never  Other Topics Concern   Not on file  Social History Narrative   Regular exercise-mo   Caffeine Use-no   Social Determinants of Health   Financial Resource Strain: Medium Risk (07/14/2021)   Overall Financial Resource Strain (CARDIA)    Difficulty of Paying Living Expenses: Somewhat hard  Food Insecurity: No Food Insecurity (11/24/2021)   Hunger Vital Sign    Worried About Running Out of Food in the Last Year: Never true    Ran Out of Food in the Last Year: Never true  Transportation Needs: No Transportation Needs (11/24/2021)   PRAPARE -  Administrator, Civil Service (Medical): No    Lack of Transportation (Non-Medical): No  Physical Activity: Sufficiently Active (11/24/2021)   Exercise Vital Sign    Days of Exercise per Week: 7 days    Minutes of Exercise per Session: 30 min  Stress: No Stress Concern Present (11/24/2021)   [employer] of Occupational Health - Occupational Stress Questionnaire    Feeling of Stress : Only a little  Social Connections: Socially Integrated (11/24/2021)   Social Connection and Isolation Panel [NHANES]    Frequency of Communication with Friends and Family: More than three times a week    Frequency of Social Gatherings with Friends and Family: More than three times a week    Attends Religious Services: More than 4 times per year    Active Member of [employer] or Organizations: Yes    Attends Banker Meetings: More than 4 times per year    Marital Status: Married  Catering manager Violence: Not At Risk (11/24/2021)   Humiliation, Afraid, Rape, and Kick questionnaire    Fear of Current or Ex-Partner: No    Emotionally Abused: No    Physically Abused: No    Sexually Abused: No    Past Surgical History:  Procedure Laterality Date   BACK SURGERY  1986   ruptured disc   BREAST BIOPSY Left 2008   NEG   BREAST BIOPSY Left 08-20-14   POS   BREAST BIOPSY Right 2007   NEG   BREAST EXCISIONAL BIOPSY Left 1984   NEG   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 HER2 negative.  Wide excision with SLN biopsy.  MammoSite radiation   CARPAL TUNNEL RELEASE     CHOLECYSTECTOMY  2006   COLONOSCOPY  2010   Dr. Evette Cristal   COLONOSCOPY WITH PROPOFOL N/A 05/17/2017   Procedure: COLONOSCOPY WITH PROPOFOL;  Surgeon: Scot Jun, MD;  Location: [facility] ENDOSCOPY;  Service: Endoscopy;  Laterality: N/A;   DILATION AND CURETTAGE OF UTERUS     ESOPHAGOGASTRODUODENOSCOPY (EGD) WITH PROPOFOL N/A 05/17/2017   Procedure:  ESOPHAGOGASTRODUODENOSCOPY (EGD) WITH PROPOFOL;  Surgeon: Scot Jun, MD;  Location: [facility] ENDOSCOPY;  Service: Endoscopy;  Laterality: N/A;   LAPAROTOMY FOR REMOVAL TUMOR LUMBAR PLEXES     Leg stent  2011   MOLE REMOVAL  2013   15 removed   POSTERIOR CERVICAL LAMINECTOMY Left 10/15/2015   Procedure: Left Cervical four- five Hemilaminectomy/Remove mass;  Surgeon: Hilda Lias, MD;  Location: [facility] NEURO ORS;  Service: Neurosurgery;  Laterality: Left;  Left C4-5 Hemilaminectomy/Remove mass    Family History  Problem Relation Age of Onset   Cancer Father        Prostate   Diabetes Father    Cerebrovascular Accident Father    Hypertension Mother    AAA (abdominal aortic aneurysm) Mother    Hyperlipidemia Other        Parent   Miscarriages / Stillbirths Other        Parent   Hypertension Other        parent   Heart disease Other        Parent   Breast cancer Maternal Aunt 72    Allergies  Allergen Reactions   Codeine Nausea And Vomiting   Prednisone Swelling   Tape Itching and Rash    Please use "paper" tape Please use "paper" tape       Latest Ref Rng & Units 02/19/2022    8:07 AM 02/04/2021    7:53 AM 02/09/2020    1:03 PM  CBC  WBC 4.0 - 10.5 K/uL 6.1  5.8  6.6   Hemoglobin 12.0 - 15.0 g/dL 16.1  09.6  04.5   Hematocrit 36.0 - 46.0 % 37.7  37.7  37.1   Platelets 150.0 - 400.0 K/uL 172.0  168.0  191.0       CMP     Component Value Date/Time   NA 139 05/27/2022 0801   NA 138 10/23/2013 1236   K 4.2 05/27/2022 0801   K 3.8 08/27/2014 0849   CL 104 05/27/2022 0801   CL 104 10/23/2013 1236   CO2 27 05/27/2022 0801   CO2 28 10/23/2013 1236   GLUCOSE 120 (H) 05/27/2022 0801   GLUCOSE 96 10/23/2013 1236   BUN 23 05/27/2022 0801   BUN 17 10/23/2013 1236   CREATININE 1.10 05/27/2022 0801   CREATININE 0.87 10/23/2013 1236   CALCIUM 9.2 05/27/2022 0801   CALCIUM 9.3 10/23/2013 1236   PROT 7.4 05/27/2022 0801   PROT 7.6 07/13/2012 1235   ALBUMIN 4.3  05/27/2022 0801   ALBUMIN 3.4 07/13/2012 1235   AST 14 05/27/2022 0801   AST 39 (H) 07/13/2012 1235   ALT 14 05/27/2022 0801   ALT 73 07/13/2012 1235   ALKPHOS 45 05/27/2022 0801   ALKPHOS 49 (L) 07/13/2012 1235   BILITOT 0.5 05/27/2022 0801   BILITOT 0.5 07/13/2012 1235   GFRNONAA 48 (L) 12/14/2019 1048   GFRNONAA >60 10/23/2013 1236   GFRAA 56 (L) 12/14/2019 1048   GFRAA >60 10/23/2013 1236     VAS [place] ABI WITH/WO TBI  Result Date: 09/14/2022  LOWER EXTREMITY DOPPLER STUDY Patient Name:  ONELIA ZEIEN  Date of Exam:   09/10/2022 Medical Rec #: [id number]        Accession #:    [id number] Date of Birth: Apr 25, 1946         Patient Gender: F Patient Age:   74 years Exam Location:  [facility] Procedure:      VAS [place] ABI WITH/WO TBI Referring Phys: Festus Barren --------------------------------------------------------------------------------  Indications: Peripheral artery disease, and 2007 - Right CIA,EIA PTA  and stent              placed.  Comparison Study: 04/22/2018 Performing Technologist: Debbe Bales RVS  Examination Guidelines: A complete evaluation includes at minimum, Doppler waveform signals and systolic blood pressure reading at the level of bilateral brachial, anterior tibial, and posterior tibial arteries, when vessel segments are accessible. Bilateral testing is considered an integral part of a complete examination. Photoelectric Plethysmograph (PPG) waveforms and toe systolic pressure readings are included as required and additional duplex testing as needed. Limited examinations for reoccurring indications may be performed as noted.  ABI Findings: +---------+------------------+-----+---------+--------+ Right    Rt Pressure (mmHg)IndexWaveform Comment  +---------+------------------+-----+---------+--------+ Brachial 165                                      +---------+------------------+-----+---------+--------+ ATA      181               1.10 triphasic          +---------+------------------+-----+---------+--------+ PTA      211               1.28 triphasic         +---------+------------------+-----+---------+--------+ Great Toe167               1.01 Normal            +---------+------------------+-----+---------+--------+ +---------+------------------+-----+---------+------------+ Left     Lt Pressure (mmHg)IndexWaveform Comment      +---------+------------------+-----+---------+------------+ Brachial                                 Lt Masectomy +---------+------------------+-----+---------+------------+ ATA      167               1.01 triphasic             +---------+------------------+-----+---------+------------+ PTA      206               1.25 triphasic             +---------+------------------+-----+---------+------------+ Great Toe161               0.98 Normal                +---------+------------------+-----+---------+------------+ +-------+-----------+-----------+------------+------------+ ABI/TBIToday's ABIToday's TBIPrevious ABIPrevious TBI +-------+-----------+-----------+------------+------------+ Right  1.28       1.01       1.21        Not Obtained +-------+-----------+-----------+------------+------------+ Left   1.25       .98        1.14        Not Obtained +-------+-----------+-----------+------------+------------+ Bilateral ABIs appear essentially unchanged compared to prior study on 04/22/2018.  Summary: Right: Resting right ankle-brachial index is within normal range. The right toe-brachial index is normal. Left: Resting left ankle-brachial index is within normal range. The left toe-brachial index is normal. *See table(s) above for measurements and observations.  Electronically signed by Festus Barren MD on 09/14/2022 at 9:23:02 AM.    Final        Assessment & Plan:   1. Bilateral carotid artery disease, unspecified type (HCC) Recommend:  Given the patient's asymptomatic subcritical  stenosis no further invasive testing or surgery at this time.  Duplex ultrasound shows 1-39% stenosis bilaterally.  Continue antiplatelet therapy as prescribed Continue management of CAD, HTN and Hyperlipidemia Healthy heart diet,  encouraged exercise at  least 4 times per week Follow up in 24 months with duplex ultrasound and physical exam   2. Primary hypertension Continue antihypertensive medications as already ordered, these medications have been reviewed and there are no changes at this time.  3. Diabetes mellitus with cardiac complication (HCC) Continue hypoglycemic medications as already ordered, these medications have been reviewed and there are no changes at this time.  Hgb A1C to be monitored as already arranged by primary service  4. Peripheral vascular disease (HCC)  Recommend:  The patient has evidence of atherosclerosis of the lower extremities with no claudication.  The patient does not voice lifestyle limiting changes at this point in time.  Noninvasive studies do not suggest clinically significant change.  No invasive studies, angiography or surgery at this time The patient should continue walking and begin a more formal exercise program.  The patient should continue antiplatelet therapy and aggressive treatment of the lipid abnormalities  No changes in the patient's medications at this time  Continued surveillance is indicated as atherosclerosis is likely to progress with time.    The patient will continue follow up with noninvasive studies as ordered.  She will follow-up in 24 months   Current Outpatient Medications on File Prior to Visit  Medication Sig Dispense Refill   acetaminophen (TYLENOL) 650 MG CR tablet Take 650 mg by mouth as needed for pain.     albuterol (VENTOLIN HFA) 108 (90 Base) MCG/ACT inhaler Inhale 2 puffs into the lungs every 6 (six) hours as needed for wheezing or shortness of breath. 8 g 2   aspirin 325 MG tablet Take 325 mg by mouth  daily.     carvedilol (COREG) 12.5 MG tablet TAKE 1 TABLET(12.5 MG) BY MOUTH TWICE DAILY WITH MEALS 180 tablet 1   Cholecalciferol 25 MCG (1000 UT) tablet Take 1,000 Units by mouth daily.      empagliflozin (JARDIANCE) 25 MG TABS tablet Take 1 tablet (25 mg total) by mouth daily before breakfast. 90 tablet 3   glucose blood (ONETOUCH VERIO) test strip Use to check blood sugars up to three times daily. 300 each 3   hydrALAZINE (APRESOLINE) 10 MG tablet TAKE 1 TABLET(10 MG) BY MOUTH THREE TIMES DAILY 270 tablet 1   losartan-hydrochlorothiazide (HYZAAR) 100-25 MG tablet TAKE 1 TABLET BY MOUTH DAILY 90 tablet 1   meclizine (ANTIVERT) 25 MG tablet Take 1 tablet (25 mg total) by mouth daily as needed for dizziness. 30 tablet 0   omeprazole (PRILOSEC) 40 MG capsule TAKE 1 CAPSULE(40 MG) BY MOUTH DAILY 90 capsule 3   OneTouch Delica Lancets 30G MISC Use to check blood sugars up to 3 times daily. 300 each 3   rosuvastatin (CRESTOR) 40 MG tablet TAKE 1 TABLET(40 MG) BY MOUTH DAILY 90 tablet 3   vitamin E 400 UNIT capsule Take 400 Units by mouth daily.     No current facility-administered medications on file prior to visit.    There are no Patient Instructions on file for this visit. No follow-ups on file.   Georgiana Spinner, NP

## 2022-10-01 ENCOUNTER — Other Ambulatory Visit (INDEPENDENT_AMBULATORY_CARE_PROVIDER_SITE_OTHER): Payer: Medicare Other

## 2022-10-01 DIAGNOSIS — E1159 Type 2 diabetes mellitus with other circulatory complications: Secondary | ICD-10-CM | POA: Diagnosis not present

## 2022-10-01 DIAGNOSIS — E7800 Pure hypercholesterolemia, unspecified: Secondary | ICD-10-CM

## 2022-10-01 DIAGNOSIS — I10 Essential (primary) hypertension: Secondary | ICD-10-CM

## 2022-10-01 DIAGNOSIS — Z7984 Long term (current) use of oral hypoglycemic drugs: Secondary | ICD-10-CM

## 2022-10-01 LAB — BASIC METABOLIC PANEL
BUN: 34 mg/dL — ABNORMAL HIGH (ref 6–23)
CO2: 25 mEq/L (ref 19–32)
Calcium: 9.8 mg/dL (ref 8.4–10.5)
Chloride: 103 mEq/L (ref 96–112)
Creatinine, Ser: 1.26 mg/dL — ABNORMAL HIGH (ref 0.40–1.20)
GFR: 41 mL/min — ABNORMAL LOW (ref >60.00)
Glucose, Bld: 125 mg/dL — ABNORMAL HIGH (ref 70–99)
Potassium: 4 mEq/L (ref 3.5–5.1)
Sodium: 138 mEq/L (ref 135–145)

## 2022-10-01 LAB — LIPID PANEL
Cholesterol: 133 mg/dL (ref 0–200)
HDL: 41.3 mg/dL (ref >39.00)
LDL Cholesterol: 60 mg/dL (ref 0–99)
NonHDL: 91.3
Total CHOL/HDL Ratio: 3
Triglycerides: 156 mg/dL — ABNORMAL HIGH (ref 0.0–149.0)
VLDL: 31.2 mg/dL (ref 0.0–40.0)

## 2022-10-01 LAB — HEPATIC FUNCTION PANEL
ALT: 17 U/L (ref 0–35)
AST: 15 U/L (ref 0–37)
Albumin: 4.4 g/dL (ref 3.5–5.2)
Alkaline Phosphatase: 45 U/L (ref 39–117)
Bilirubin, Direct: 0.1 mg/dL (ref 0.0–0.3)
Total Bilirubin: 0.5 mg/dL (ref 0.2–1.2)
Total Protein: 7.2 g/dL (ref 6.0–8.3)

## 2022-10-01 LAB — HEMOGLOBIN A1C: Hgb A1c MFr Bld: 7 % — ABNORMAL HIGH (ref 4.6–6.5)

## 2022-10-06 ENCOUNTER — Ambulatory Visit (INDEPENDENT_AMBULATORY_CARE_PROVIDER_SITE_OTHER): Payer: Medicare Other | Admitting: Internal Medicine

## 2022-10-06 VITALS — BP 120/70 | HR 65 | Temp 97.9°F | Resp 16 | Ht 64.0 in | Wt 187.0 lb

## 2022-10-06 DIAGNOSIS — I25119 Atherosclerotic heart disease of native coronary artery with unspecified angina pectoris: Secondary | ICD-10-CM

## 2022-10-06 DIAGNOSIS — K219 Gastro-esophageal reflux disease without esophagitis: Secondary | ICD-10-CM

## 2022-10-06 DIAGNOSIS — Z0000 Encounter for general adult medical examination without abnormal findings: Secondary | ICD-10-CM | POA: Diagnosis not present

## 2022-10-06 DIAGNOSIS — E7800 Pure hypercholesterolemia, unspecified: Secondary | ICD-10-CM | POA: Diagnosis not present

## 2022-10-06 DIAGNOSIS — G4733 Obstructive sleep apnea (adult) (pediatric): Secondary | ICD-10-CM

## 2022-10-06 DIAGNOSIS — Z7984 Long term (current) use of oral hypoglycemic drugs: Secondary | ICD-10-CM

## 2022-10-06 DIAGNOSIS — I779 Disorder of arteries and arterioles, unspecified: Secondary | ICD-10-CM | POA: Diagnosis not present

## 2022-10-06 DIAGNOSIS — Z853 Personal history of malignant neoplasm of breast: Secondary | ICD-10-CM | POA: Diagnosis not present

## 2022-10-06 DIAGNOSIS — I10 Essential (primary) hypertension: Secondary | ICD-10-CM | POA: Diagnosis not present

## 2022-10-06 DIAGNOSIS — R7989 Other specified abnormal findings of blood chemistry: Secondary | ICD-10-CM

## 2022-10-06 DIAGNOSIS — E1159 Type 2 diabetes mellitus with other circulatory complications: Secondary | ICD-10-CM | POA: Diagnosis not present

## 2022-10-06 DIAGNOSIS — F439 Reaction to severe stress, unspecified: Secondary | ICD-10-CM

## 2022-10-06 DIAGNOSIS — Z1211 Encounter for screening for malignant neoplasm of colon: Secondary | ICD-10-CM

## 2022-10-06 DIAGNOSIS — N1831 Chronic kidney disease, stage 3a: Secondary | ICD-10-CM

## 2022-10-06 DIAGNOSIS — E041 Nontoxic single thyroid nodule: Secondary | ICD-10-CM

## 2022-10-06 DIAGNOSIS — J449 Chronic obstructive pulmonary disease, unspecified: Secondary | ICD-10-CM

## 2022-10-06 DIAGNOSIS — I739 Peripheral vascular disease, unspecified: Secondary | ICD-10-CM

## 2022-10-06 DIAGNOSIS — I700 Atherosclerosis of aorta: Secondary | ICD-10-CM

## 2022-10-06 LAB — HM DIABETES FOOT EXAM

## 2022-10-06 MED ORDER — HYDRALAZINE HCL 10 MG PO TABS: ORAL_TABLET | ORAL | 1 refills | Status: DC

## 2022-10-06 MED ORDER — OMEPRAZOLE 40 MG PO CPDR: DELAYED_RELEASE_CAPSULE | ORAL | 3 refills | Status: DC

## 2022-10-06 NOTE — Assessment & Plan Note (Signed)
Physical today 10/06/22.  Colonoscopy 05/2017 - recommended f/u in 3 years.  Overdue. Will notify me when agreeable.  Mammogram 06/23/22 - Birads I.

## 2022-10-06 NOTE — Assessment & Plan Note (Signed)
On Crestor.  Low-cholesterol diet and exercise.  Follow lipid panel and liver function tests.   Lab Results  Component Value Date   CHOL 133 10/01/2022   HDL 41.30 10/01/2022   LDLCALC 60 10/01/2022   LDLDIRECT 71.0 02/19/2022   TRIG 156.0 (H) 10/01/2022   CHOLHDL 3 10/01/2022

## 2022-10-06 NOTE — Progress Notes (Signed)
Subjective:    [name], female    DOB: [birth year], 77 y.o.   MRN: [id number]  Patient here for  Chief Complaint  Patient presents with   Annual Exam    HPI Here for a physical exam.  Saw cardiology 07/14/22 - pre op evaluation.  Stable.  No change made. Had f/u with [facility] 09/10/22 - Carotid duplex today continues to show mild 1 to 39% carotid artery stenosis bilaterally. ABIs were normal at 1.28 on the right and 1.25 on the left with triphasic waveforms. Recommended continuing antiplatelet therapy and f/u in 24 months.  Reviewed recent labs.  A1c 7.0.  discussed additional medication. She is currently on jardiance.  Intolerance to metformin.  She declines adding medication.  Wants to work on diet and exercise and monitor sugars.  Increased stress.  Feels this has been contributing.  Has been out of her routine.  No chest pain or sob reported.  No abdominal pain or bowel change reported.  Discussed due colonoscopy.     Past Medical History:  Diagnosis Date   Arthritis    Breast cancer (HCC)    Breast cancer, left (HCC) 2016   LT LUMPECTOMY - TI, NO, MO - IDC, ER/PR pos, Her 2 neg, Rad tx's.    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   CHF (congestive heart failure) (HCC)    COPD (chronic obstructive pulmonary disease) (HCC)    Coronary artery disease    Depression    Diffuse cystic mastopathy 2013   DVT (deep venous thrombosis) (HCC)    Dyspnea    Elevated TSH    Family history of adverse reaction to anesthesia    Pt stated that son had a seizure with a combination of anesthesia and pain medication."   Fatty liver    GERD (gastroesophageal reflux disease)    Glaucoma    High cholesterol    History of chicken pox    Hyperglycemia    Hyperlipidemia    Hypertension    LVH (left ventricular hypertrophy)    PAC (premature atrial contraction)    Palpitations    Peripheral vascular  disease (HCC)    Personal history of radiation therapy 2016   BREAST CA   Pneumonia March 2014   Skin cancer 2013   Sleep apnea    wears CPAP set at 2.5   Wears glasses    Past Surgical History:  Procedure Laterality Date   BACK SURGERY  1986   ruptured disc   BREAST BIOPSY Left 2008   NEG   BREAST BIOPSY Left 08-20-14   POS   BREAST BIOPSY Right 2007   NEG   BREAST EXCISIONAL BIOPSY Left 1984   NEG   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 HER2 negative.  Wide excision with SLN biopsy.  MammoSite radiation   CARPAL TUNNEL RELEASE     CHOLECYSTECTOMY  2006   COLONOSCOPY  2010   Dr. Evette Cristal   COLONOSCOPY WITH PROPOFOL N/A 05/17/2017   Procedure: COLONOSCOPY WITH PROPOFOL;  Surgeon: Scot Jun, MD;  Location: [facility] ENDOSCOPY;  Service: Endoscopy;  Laterality: N/A;   DILATION AND CURETTAGE OF UTERUS     ESOPHAGOGASTRODUODENOSCOPY (EGD) WITH PROPOFOL N/A 05/17/2017   Procedure: ESOPHAGOGASTRODUODENOSCOPY (EGD) WITH PROPOFOL;  Surgeon: Scot Jun, MD;  Location: [facility]  ENDOSCOPY;  Service: Endoscopy;  Laterality: N/A;   LAPAROTOMY FOR REMOVAL TUMOR LUMBAR PLEXES     Leg stent  2011   MOLE REMOVAL  2013   15 removed   POSTERIOR CERVICAL LAMINECTOMY Left 10/15/2015   Procedure: Left Cervical four- five Hemilaminectomy/Remove mass;  Surgeon: Hilda Lias, MD;  Location: [facility] NEURO ORS;  Service: Neurosurgery;  Laterality: Left;  Left C4-5 Hemilaminectomy/Remove mass   Family History  Problem Relation Age of Onset   Cancer Father        Prostate   Diabetes Father    Cerebrovascular Accident Father    Hypertension Mother    AAA (abdominal aortic aneurysm) Mother    Hyperlipidemia Other        Parent   Miscarriages / Stillbirths Other        Parent   Hypertension Other        parent   Heart disease Other        Parent   Breast cancer Maternal Aunt 54   Social History   Socioeconomic History   Marital  status: Married    Spouse name: Not on file   Number of children: 3   Years of education: 12   Highest education level: Not on file  Occupational History   Occupation: Caregiver   Tobacco Use   Smoking status: Never   Smokeless tobacco: Never  Substance and Sexual Activity   Alcohol use: No    Alcohol/week: 0.0 standard drinks of alcohol   Drug use: No   Sexual activity: Never  Other Topics Concern   Not on file  Social History Narrative   Regular exercise-mo   Caffeine Use-no   Social Determinants of Health   Financial Resource Strain: Medium Risk (07/14/2021)   Overall Financial Resource Strain (CARDIA)    Difficulty of Paying Living Expenses: Somewhat hard  Food Insecurity: No Food Insecurity (11/24/2021)   Hunger Vital Sign    Worried About Running Out of Food in the Last Year: Never true    Ran Out of Food in the Last Year: Never true  Transportation Needs: No Transportation Needs (11/24/2021)   PRAPARE - Administrator, Civil Service (Medical): No    Lack of Transportation (Non-Medical): No  Physical Activity: Sufficiently Active (11/24/2021)   Exercise Vital Sign    Days of Exercise per Week: 7 days    Minutes of Exercise per Session: 30 min  Stress: No Stress Concern Present (11/24/2021)   [employer] of Occupational Health - Occupational Stress Questionnaire    Feeling of Stress : Only a little  Social Connections: Socially Integrated (11/24/2021)   Social Connection and Isolation Panel [NHANES]    Frequency of Communication with Friends and Family: More than three times a week    Frequency of Social Gatherings with Friends and Family: More than three times a week    Attends Religious Services: More than 4 times per year    Active Member of [employer] or Organizations: Yes    Attends Engineer, structural: More than 4 times per year    Marital Status: Married     Review of Systems  Constitutional:  Negative for appetite change and unexpected  weight change.  HENT:  Negative for congestion, sinus pressure and sore throat.   Eyes:  Negative for pain and visual disturbance.  Respiratory:  Negative for cough, chest tightness and shortness of breath.   Cardiovascular:  Negative for chest pain, palpitations and leg swelling.  Gastrointestinal:  Negative for abdominal pain, diarrhea, nausea and vomiting.  Genitourinary:  Negative for difficulty urinating and dysuria.  Musculoskeletal:  Negative for joint swelling and myalgias.  Skin:  Negative for color change and rash.  Neurological:  Negative for dizziness and headaches.  Hematological:  Negative for adenopathy. Does not bruise/bleed easily.  Psychiatric/Behavioral:  Negative for agitation.        Increased stress as outlined.         Objective:     BP 120/70   Pulse 65   Temp 97.9 F (36.6 C)   Resp 16   Ht 5\' 4"  (1.626 m)   Wt 187 lb (84.8 kg)   SpO2 98%   BMI 32.10 kg/m  Wt Readings from Last 3 Encounters:  10/06/22 187 lb (84.8 kg)  09/10/22 187 lb 6.4 oz (85 kg)  07/01/22 188 lb (85.3 kg)    Physical Exam Vitals reviewed.  Constitutional:      General: She is not in acute distress.    Appearance: Normal appearance. She is well-developed.  HENT:     Head: Normocephalic and atraumatic.     Right Ear: External ear normal.     Left Ear: External ear normal.  Eyes:     General: No scleral icterus.       Right eye: No discharge.        Left eye: No discharge.     Conjunctiva/sclera: Conjunctivae normal.  Neck:     Thyroid: No thyromegaly.  Cardiovascular:     Rate and Rhythm: Normal rate and regular rhythm.  Pulmonary:     Effort: No tachypnea, accessory muscle usage or respiratory distress.     Breath sounds: Normal breath sounds. No decreased breath sounds or wheezing.  Chest:  Breasts:    Right: No inverted nipple, mass, nipple discharge or tenderness (no axillary adenopathy).     Left: No inverted nipple, mass, nipple discharge or tenderness (no  axilarry adenopathy).  Abdominal:     General: Bowel sounds are normal.     Palpations: Abdomen is soft.     Tenderness: There is no abdominal tenderness.  Musculoskeletal:        General: No swelling or tenderness.     Cervical back: Neck supple.  Lymphadenopathy:     Cervical: No cervical adenopathy.  Skin:    Findings: No erythema or rash.  Neurological:     Mental Status: She is alert and oriented to person, place, and time.  Psychiatric:        Mood and Affect: Mood normal.        Behavior: Behavior normal.      Outpatient Encounter Medications as of 10/06/2022  Medication Sig   acetaminophen (TYLENOL) 650 MG CR tablet Take 650 mg by mouth as needed for pain.   albuterol (VENTOLIN HFA) 108 (90 Base) MCG/ACT inhaler Inhale 2 puffs into the lungs every 6 (six) hours as needed for wheezing or shortness of breath.   aspirin 325 MG tablet Take 325 mg by mouth daily.   carvedilol (COREG) 12.5 MG tablet TAKE 1 TABLET(12.5 MG) BY MOUTH TWICE DAILY WITH MEALS   Cholecalciferol 25 MCG (1000 UT) tablet Take 1,000 Units by mouth daily.    empagliflozin (JARDIANCE) 25 MG TABS tablet Take 1 tablet (25 mg total) by mouth daily before breakfast.   glucose blood (ONETOUCH VERIO) test strip Use to check blood sugars up to three times daily.   hydrALAZINE (APRESOLINE) 10 MG tablet TAKE 1 TABLET(10 MG) BY  MOUTH THREE TIMES DAILY   losartan-hydrochlorothiazide (HYZAAR) 100-25 MG tablet TAKE 1 TABLET BY MOUTH DAILY   meclizine (ANTIVERT) 25 MG tablet Take 1 tablet (25 mg total) by mouth daily as needed for dizziness.   omeprazole (PRILOSEC) 40 MG capsule TAKE 1 CAPSULE(40 MG) BY MOUTH DAILY   OneTouch Delica Lancets 30G MISC Use to check blood sugars up to 3 times daily.   rosuvastatin (CRESTOR) 40 MG tablet TAKE 1 TABLET(40 MG) BY MOUTH DAILY   vitamin E 400 UNIT capsule Take 400 Units by mouth daily.   [DISCONTINUED] hydrALAZINE (APRESOLINE) 10 MG tablet TAKE 1 TABLET(10 MG) BY MOUTH THREE TIMES  DAILY   [DISCONTINUED] omeprazole (PRILOSEC) 40 MG capsule TAKE 1 CAPSULE(40 MG) BY MOUTH DAILY   No facility-administered encounter medications on file as of 10/06/2022.     Lab Results  Component Value Date   WBC 6.1 02/19/2022   HGB 12.8 02/19/2022   HCT 37.7 02/19/2022   PLT 172.0 02/19/2022   GLUCOSE 125 (H) 10/01/2022   CHOL 133 10/01/2022   TRIG 156.0 (H) 10/01/2022   HDL 41.30 10/01/2022   LDLDIRECT 71.0 02/19/2022   LDLCALC 60 10/01/2022   ALT 17 10/01/2022   AST 15 10/01/2022   NA 138 10/01/2022   K 4.0 10/01/2022   CL 103 10/01/2022   CREATININE 1.26 (H) 10/01/2022   BUN 34 (H) 10/01/2022   CO2 25 10/01/2022   TSH 5.33 05/27/2022   INR 0.9 07/13/2012   HGBA1C 7.0 (H) 10/01/2022   MICROALBUR 1.0 10/30/2021    MM 3D SCREEN BREAST BILATERAL  Result Date: 06/25/2022 CLINICAL DATA:  Screening. EXAM: DIGITAL SCREENING BILATERAL MAMMOGRAM WITH TOMOSYNTHESIS AND CAD TECHNIQUE: Bilateral screening digital craniocaudal and mediolateral oblique mammograms were obtained. Bilateral screening digital breast tomosynthesis was performed. The images were evaluated with computer-aided detection. COMPARISON:  Previous exam(s). ACR Breast Density Category c: The breasts are heterogeneously dense, which may obscure small masses. FINDINGS: There are no findings suspicious for malignancy. IMPRESSION: No mammographic evidence of malignancy. A result letter of this screening mammogram will be mailed directly to the patient. RECOMMENDATION: Screening mammogram in one year. (Code:SM-B-01Y) BI-RADS CATEGORY  1: Negative. Electronically Signed   By: Sande Brothers M.D.   On: 06/25/2022 10:05       Assessment & Plan:  Routine general medical examination at a health care facility  Health care maintenance Assessment & Plan: Physical today 10/06/22.  Colonoscopy 05/2017 - recommended f/u in 3 years.  Overdue. Will notify me when agreeable.  Mammogram 06/23/22 - Birads I.     Hypercholesterolemia Assessment & Plan: On Crestor.  Low-cholesterol diet and exercise.  Follow lipid panel and liver function tests.   Lab Results  Component Value Date   CHOL 133 10/01/2022   HDL 41.30 10/01/2022   LDLCALC 60 10/01/2022   LDLDIRECT 71.0 02/19/2022   TRIG 156.0 (H) 10/01/2022   CHOLHDL 3 10/01/2022     Orders: -     Lipid panel; Future -     Hepatic function panel; Future -     Basic metabolic panel; Future  Type 2 diabetes mellitus with other circulatory complication, without long-term current use of insulin (HCC) -     Hemoglobin A1c; Future -     Microalbumin / creatinine urine ratio; Future  Colon cancer screening -     Ambulatory referral to Gastroenterology  Abnormal liver function test Assessment & Plan: Fatty liver found on previous CT.  Diet and exercise.  Follow liver  function tests. Recent labs revealed normal liver function tests.     Aortic atherosclerosis (HCC) Assessment & Plan: Continue crestor.    Coronary artery disease involving native coronary artery of native heart with angina pectoris [facility]) Assessment & Plan: Saw cardiology 07/14/22 - pre op evaluation.  Stable.  No change made. Continue risk factor modification.    Bilateral carotid artery disease, unspecified type [facility]) Assessment & Plan: Evaluated [facility] 09/2022 1-39% carotid ultrasound.  Recommended f/u in 2 years.     Stage 3a chronic kidney disease (HCC) Assessment & Plan: Avoid anti-inflammatories..  Stay hydrated. Urine - no protein.  Follow metabolic panel. Continue ARB   Chronic obstructive pulmonary disease, unspecified COPD type (HCC) Assessment & Plan: Breathing overall stable.  No sob.  Follow.    Diabetes mellitus with cardiac complication [facility]) Assessment & Plan: On jardiance.  A1c just checked 7.0. Discussed -  Low carb diet and exercise.  Follow met b and a1c. Discussed additional medication.  Desires not to add medication at this time.  Wants to work  on diet and exercise.  Eye exam - 01/2022.    Gastroesophageal reflux disease without esophagitis Assessment & Plan: No upper symptom reported.  On prilosec.    History of breast cancer Assessment & Plan: Completed femara.  Mammogram 06/23/22 - Birads I.    Primary hypertension Assessment & Plan: Continue losartan/hctz, coreg and hydralazine.  Follow pressures.  Follow metabolic panel. Blood pressures as outlined.   OSA on CPAP Assessment & Plan: Continue cpap.    Peripheral vascular disease (HCC) Assessment & Plan: Has seen [facility].  Continue aspirin and Crestor.    Stress Assessment & Plan: Increased stress.  Has good support.  Does not feel needs any further intervention. Follow.    Thyroid nodule Assessment & Plan: Evaluated by Dr Tedd Sias.  Biopsy negative.  Follow tsh.     Other orders -     hydrALAZINE HCl; TAKE 1 TABLET(10 MG) BY MOUTH THREE TIMES DAILY  Dispense: 270 tablet; Refill: 1 -     Omeprazole; TAKE 1 CAPSULE(40 MG) BY MOUTH DAILY  Dispense: 90 capsule; Refill: 3     Dale [place], MD

## 2022-10-11 ENCOUNTER — Encounter: Payer: Self-pay | Admitting: Internal Medicine

## 2022-10-11 DIAGNOSIS — G4733 Obstructive sleep apnea (adult) (pediatric): Secondary | ICD-10-CM | POA: Diagnosis not present

## 2022-10-11 NOTE — Assessment & Plan Note (Signed)
Completed femara.  Mammogram 06/23/22 - Birads I.

## 2022-10-11 NOTE — Assessment & Plan Note (Signed)
Increased stress.  Has good support.  Does not feel needs any further intervention. Follow.

## 2022-10-11 NOTE — Assessment & Plan Note (Signed)
Has seen [facility].  Continue aspirin and Crestor.

## 2022-10-11 NOTE — Assessment & Plan Note (Signed)
Continue losartan/hctz, coreg and hydralazine.  Follow pressures.  Follow metabolic panel. Blood pressures as outlined.

## 2022-10-11 NOTE — Assessment & Plan Note (Signed)
No upper symptom reported.  On prilosec.  ?

## 2022-10-11 NOTE — Assessment & Plan Note (Signed)
On jardiance.  A1c just checked 7.0. Discussed -  Low carb diet and exercise.  Follow met b and a1c. Discussed additional medication.  Desires not to add medication at this time.  Wants to work on diet and exercise.  Eye exam - 01/2022.

## 2022-10-11 NOTE — Assessment & Plan Note (Signed)
Evaluated [facility] 09/2022 1-39% carotid ultrasound.  Recommended f/u in 2 years.

## 2022-10-11 NOTE — Assessment & Plan Note (Signed)
Evaluated by Dr Solum.  Biopsy negative.  Follow tsh.   

## 2022-10-11 NOTE — Assessment & Plan Note (Signed)
Fatty liver found on previous CT.  Diet and exercise.  Follow liver function tests. Recent labs revealed normal liver function tests.   

## 2022-10-11 NOTE — Assessment & Plan Note (Signed)
Saw cardiology 07/14/22 - pre op evaluation.  Stable.  No change made. Continue risk factor modification.

## 2022-10-11 NOTE — Assessment & Plan Note (Signed)
Breathing overall stable.  No sob.  Follow.  

## 2022-10-11 NOTE — Assessment & Plan Note (Signed)
Avoid anti-inflammatories..  Stay hydrated. Urine - no protein.  Follow metabolic panel. Continue ARB 

## 2022-10-11 NOTE — Assessment & Plan Note (Signed)
Continue cpap.  

## 2022-10-11 NOTE — Assessment & Plan Note (Signed)
Continue crestor 

## 2022-10-15 ENCOUNTER — Other Ambulatory Visit: Payer: Self-pay | Admitting: Otolaryngology

## 2022-10-15 DIAGNOSIS — H6123 Impacted cerumen, bilateral: Secondary | ICD-10-CM | POA: Diagnosis not present

## 2022-10-15 DIAGNOSIS — H61032 Chondritis of left external ear: Secondary | ICD-10-CM | POA: Diagnosis not present

## 2022-10-15 DIAGNOSIS — R221 Localized swelling, mass and lump, neck: Secondary | ICD-10-CM | POA: Diagnosis not present

## 2022-10-15 DIAGNOSIS — R220 Localized swelling, mass and lump, head: Secondary | ICD-10-CM

## 2022-10-15 DIAGNOSIS — H6063 Unspecified chronic otitis externa, bilateral: Secondary | ICD-10-CM | POA: Diagnosis not present

## 2022-10-16 ENCOUNTER — Ambulatory Visit
Admission: RE | Admit: 2022-10-16 | Discharge: 2022-10-16 | Disposition: A | Discharge status: Home / Self Care | Payer: Medicare Other | Source: Ambulatory Visit | Attending: Otolaryngology | Admitting: Otolaryngology

## 2022-10-16 DIAGNOSIS — R221 Localized swelling, mass and lump, neck: Secondary | ICD-10-CM | POA: Diagnosis not present

## 2022-10-16 DIAGNOSIS — R220 Localized swelling, mass and lump, head: Secondary | ICD-10-CM

## 2022-10-19 ENCOUNTER — Other Ambulatory Visit: Payer: Medicare Other

## 2022-12-09 ENCOUNTER — Ambulatory Visit (INDEPENDENT_AMBULATORY_CARE_PROVIDER_SITE_OTHER): Payer: Medicare Other | Admitting: *Deleted

## 2022-12-09 VITALS — BP 140/60 | HR 52 | Temp 98.6°F | Ht 64.0 in | Wt 185.0 lb

## 2022-12-09 DIAGNOSIS — Z0000 Encounter for general adult medical examination without abnormal findings: Secondary | ICD-10-CM

## 2022-12-09 NOTE — Progress Notes (Signed)
Subjective:   Lindsey Bentley is a 77 y.o. female who presents for Medicare Annual (Subsequent) preventive examination.  Visit Complete: Virtual  I connected with  [name] on 12/09/22 by a audio enabled telemedicine application and verified that I am speaking with the correct person using two identifiers.  Patient Location: Home  Provider Location: Office/Clinic  I discussed the limitations of evaluation and management by telemedicine. The patient expressed understanding and agreed to proceed.  Vital Signs: Unable to obtain new vitals due to this being a telehealth visit. Patient was able to provide some vital signs which has been documented.  Review of Systems     Cardiac Risk Factors include: advanced age (>70men, >68 women);diabetes mellitus;hypertension;dyslipidemia;obesity (BMI >30kg/m2);Other (see comment), Risk factor comments: CAD     Objective:    Today's Vitals   12/09/22 1437  BP: (!) 140/60  Pulse: (!) 52  Temp: 98.6 F (37 C)  TempSrc: Skin  SpO2: 96%  Weight: 185 lb (83.9 kg)  Height: 5\' 4"  (1.626 m)   Body mass index is 31.76 kg/m.     12/09/2022    2:53 PM 11/24/2021   11:09 AM 11/20/2020    1:30 PM 12/14/2019   10:48 AM 11/20/2019    1:50 PM 11/16/2018    1:18 PM 06/29/2018    1:40 PM  Advanced Directives  Does Patient Have a Medical Advance Directive? No No No No No No No  Would patient like information on creating a medical advance directive? No - Patient declined No - Patient declined No - Patient declined No - Patient declined No - Patient declined No - Patient declined No - Patient declined    Current Medications (verified) Outpatient Encounter Medications as of 12/09/2022  Medication Sig   acetaminophen (TYLENOL) 650 MG CR tablet Take 650 mg by mouth as needed for pain.   albuterol (VENTOLIN HFA) 108 (90 Base) MCG/ACT inhaler Inhale 2 puffs into the lungs every 6 (six) hours as needed for wheezing or shortness of breath.   aspirin 325 MG  tablet Take 325 mg by mouth daily.   carvedilol (COREG) 12.5 MG tablet TAKE 1 TABLET(12.5 MG) BY MOUTH TWICE DAILY WITH MEALS   Cholecalciferol 25 MCG (1000 UT) tablet Take 1,000 Units by mouth daily.    empagliflozin (JARDIANCE) 25 MG TABS tablet Take 1 tablet (25 mg total) by mouth daily before breakfast.   glucose blood (ONETOUCH VERIO) test strip Use to check blood sugars up to three times daily.   hydrALAZINE (APRESOLINE) 10 MG tablet TAKE 1 TABLET(10 MG) BY MOUTH THREE TIMES DAILY   losartan-hydrochlorothiazide (HYZAAR) 100-25 MG tablet TAKE 1 TABLET BY MOUTH DAILY   meclizine (ANTIVERT) 25 MG tablet Take 1 tablet (25 mg total) by mouth daily as needed for dizziness.   omeprazole (PRILOSEC) 40 MG capsule TAKE 1 CAPSULE(40 MG) BY MOUTH DAILY   OneTouch Delica Lancets 30G MISC Use to check blood sugars up to 3 times daily.   rosuvastatin (CRESTOR) 40 MG tablet TAKE 1 TABLET(40 MG) BY MOUTH DAILY   vitamin E 400 UNIT capsule Take 400 Units by mouth daily.   No facility-administered encounter medications on file as of 12/09/2022.    Allergies (verified) Codeine, Prednisone, and Tape   History: Past Medical History:  Diagnosis Date   Arthritis    Breast cancer (HCC)    Breast cancer, left (HCC) 2016   LT LUMPECTOMY - TI, NO, MO - IDC, ER/PR pos, Her 2 neg, Rad tx's.  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   CHF (congestive heart failure) (HCC)    COPD (chronic obstructive pulmonary disease) (HCC)    Coronary artery disease    Depression    Diffuse cystic mastopathy 2013   DVT (deep venous thrombosis) (HCC)    Dyspnea    Elevated TSH    Family history of adverse reaction to anesthesia    Pt stated that son had a seizure with a combination of anesthesia and pain medication."   Fatty liver    GERD (gastroesophageal reflux disease)    Glaucoma    High cholesterol    History of chicken pox     Hyperglycemia    Hyperlipidemia    Hypertension    LVH (left ventricular hypertrophy)    PAC (premature atrial contraction)    Palpitations    Peripheral vascular disease (HCC)    Personal history of radiation therapy 2016   BREAST CA   Pneumonia March 2014   Skin cancer 2013   Sleep apnea    wears CPAP set at 2.5   Wears glasses    Past Surgical History:  Procedure Laterality Date   BACK SURGERY  1986   ruptured disc   BREAST BIOPSY Left 2008   NEG   BREAST BIOPSY Left 08/20/2014   POS   BREAST BIOPSY Right 2007   NEG   BREAST EXCISIONAL BIOPSY Left 1984   NEG   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 HER2 negative.  Wide excision with SLN biopsy.  MammoSite radiation   CARPAL TUNNEL RELEASE     CATARACT EXTRACTION Right 07/2022   CATARACT EXTRACTION Left 09/2022   CHOLECYSTECTOMY  2006   COLONOSCOPY  2010   Dr. Evette Cristal   COLONOSCOPY WITH PROPOFOL N/A 05/17/2017   Procedure: COLONOSCOPY WITH PROPOFOL;  Surgeon: Scot Jun, MD;  Location[facility] ENDOSCOPY;  Service: Endoscopy;  Laterality: N/A;   DILATION AND CURETTAGE OF UTERUS     ESOPHAGOGASTRODUODENOSCOPY (EGD) WITH PROPOFOL N/A 05/17/2017   Procedure: ESOPHAGOGASTRODUODENOSCOPY (EGD) WITH PROPOFOL;  Surgeon: Scot Jun, MD;  Location: [facility] ENDOSCOPY;  Service: Endoscopy;  Laterality: N/A;   LAPAROTOMY FOR REMOVAL TUMOR LUMBAR PLEXES     Leg stent  2011   MOLE REMOVAL  2013   15 removed   POSTERIOR CERVICAL LAMINECTOMY Left 10/15/2015   Procedure: Left Cervical four- five Hemilaminectomy/Remove mass;  Surgeon: Hilda Lias, MD;  Location: [facility] NEURO ORS;  Service: Neurosurgery;  Laterality: Left;  Left C4-5 Hemilaminectomy/Remove mass   Family History  Problem Relation Age of Onset   Cancer Father        Prostate   Diabetes Father    Cerebrovascular Accident Father    Hypertension Mother    AAA (abdominal aortic aneurysm) Mother     Hyperlipidemia Other        Parent   Miscarriages / Stillbirths Other        Parent   Hypertension Other        parent   Heart disease Other        Parent   Breast cancer Maternal Aunt 27   Social History   Socioeconomic History   Marital status: Married    Spouse name: Not on file   Number of children: 3   Years of education: 12   Highest  education level: Not on file  Occupational History   Occupation: Caregiver   Tobacco Use   Smoking status: Never   Smokeless tobacco: Never  Substance and Sexual Activity   Alcohol use: No    Alcohol/week: 0.0 standard drinks of alcohol   Drug use: No   Sexual activity: Never  Other Topics Concern   Not on file  Social History Narrative   Regular exercise-mo   Caffeine Use-no   Social Determinants of Health   Financial Resource Strain: Low Risk  (12/09/2022)   Overall Financial Resource Strain (CARDIA)    Difficulty of Paying Living Expenses: Not hard at all  Food Insecurity: No Food Insecurity (12/09/2022)   Hunger Vital Sign    Worried About Running Out of Food in the Last Year: Never true    Ran Out of Food in the Last Year: Never true  Transportation Needs: No Transportation Needs (12/09/2022)   PRAPARE - Administrator, Civil Service (Medical): No    Lack of Transportation (Non-Medical): No  Physical Activity: Inactive (12/09/2022)   Exercise Vital Sign    Days of Exercise per Week: 0 days    Minutes of Exercise per Session: 0 min  Stress: No Stress Concern Present (12/09/2022)   [employer] of Occupational Health - Occupational Stress Questionnaire    Feeling of Stress : Only a little  Social Connections: Moderately Integrated (12/09/2022)   Social Connection and Isolation Panel [NHANES]    Frequency of Communication with Friends and Family: More than three times a week    Frequency of Social Gatherings with Friends and Family: More than three times a week    Attends Religious Services: More than 4 times  per year    Active Member of [employer] or Organizations: Yes    Attends Banker Meetings: More than 4 times per year    Marital Status: Widowed    Tobacco Counseling Counseling given: Not Answered   Clinical Intake:  Pre-visit preparation completed: Yes  Pain : No/denies pain     BMI - recorded: 31.76 Nutritional Status: BMI > 30  Obese Nutritional Risks: None Diabetes: Yes (fasting 114) CBG done?: No CBG resulted in Enter/ Edit results?: No Did pt. bring in CBG monitor from home?: No  How often do you need to have someone help you when you read instructions, pamphlets, or other written materials from your doctor or pharmacy?: 1 - Never  Interpreter Needed?: No  Information entered by :: R. Epifania Littrell LPN   Activities of Daily Living    12/09/2022    2:39 PM  In your present state of health, do you have any difficulty performing the following activities:  Hearing? 0  Vision? 0  Comment glasses  Difficulty concentrating or making decisions? 0  Walking or climbing stairs? 0  Dressing or bathing? 0  Doing errands, shopping? 0  Preparing Food and eating ? N  Using the Toilet? N  In the past six months, have you accidently leaked urine? N  Do you have problems with loss of bowel control? N  Managing your Medications? N  Managing your Finances? N  Housekeeping or managing your Housekeeping? N    Patient Care Team: Dale Adair, MD as PCP - General (Internal Medicine) Kieth Brightly, MD (General Surgery) Dale Ripley, MD (Internal Medicine)  Indicate any recent Medical Services you may have received from other than [facility] providers in the past year (date may be approximate).     Assessment:  This is a routine wellness examination for Meghan.  Hearing/Vision screen Hearing Screening - Comments:: No  issues Vision Screening - Comments:: Recently had cataract surgery  Dietary issues and exercise activities discussed:     Goals Addressed              This Visit's Progress    Patient Stated       None       Depression Screen    12/09/2022    2:47 PM 06/03/2022    9:01 AM 02/24/2022    1:24 PM 12/19/2021    2:37 PM 11/20/2020    1:27 PM 10/28/2020    3:21 PM 11/20/2019    1:41 PM  PHQ 2/9 Scores  PHQ - 2 Score 0 0 0 1 0 0 0  PHQ- 9 Score 0          Fall Risk    12/09/2022    2:43 PM 06/03/2022    9:01 AM 02/24/2022    1:24 PM 12/19/2021    2:37 PM 11/24/2021   11:19 AM  Fall Risk   Falls in the past year? 0 0 1 0 0  Number falls in past yr: 0 0 0 0 0  Injury with Fall? 0 0 0 0   Risk for fall due to : No Fall Risks No Fall Risks No Fall Risks No Fall Risks   Follow up Falls prevention discussed;Falls evaluation completed Falls evaluation completed Falls evaluation completed Falls evaluation completed Falls evaluation completed    MEDICARE RISK AT HOME:  Medicare Risk at Home - 12/09/22 1443     Any stairs in or around the home? Yes    If so, are there any without handrails? No    Home free of loose throw rugs in walkways, pet beds, electrical cords, etc? Yes    Adequate lighting in your home to reduce risk of falls? Yes    Life alert? No    Use of a cane, walker or w/c? No    Grab bars in the bathroom? Yes    Shower chair or bench in shower? No    Elevated toilet seat or a handicapped toilet? No              Cognitive Function:    02/12/2016   11:37 AM  MMSE - Mini Mental State Exam  Orientation to time 5  Orientation to Place 5  Registration 3  Attention/ Calculation 5  Recall 2  Language- name 2 objects 2  Language- repeat 1  Language- follow 3 step command 3  Language- read & follow direction 1  Write a sentence 1  Copy design 1  Total score 29        12/09/2022    2:54 PM 11/20/2020    1:49 PM 11/20/2019    1:50 PM 11/16/2018    1:51 PM  6CIT Screen  What Year? 0 points 0 points 0 points 0 points  What month? 0 points 0 points 0 points 0 points  What time? 0 points 0 points   0 points  Count back from 20 0 points 0 points  0 points  Months in reverse 0 points 0 points 0 points 0 points  Repeat phrase 2 points  2 points 0 points  Total Score 2 points   0 points    Immunizations Immunization History  Administered Date(s) Administered   Pneumococcal Conjugate-13 12/17/2016   Pneumococcal Polysaccharide-23 07/26/2012   Td 05/11/2004  Tetanus Due. Patient given information on how to obtain vaccine  Flu Vaccine status: Declined, Education has been provided regarding the importance of this vaccine but patient still declined. Advised may receive this vaccine at local pharmacy or Health Dept. Aware to provide a copy of the vaccination record if obtained from local pharmacy or Health Dept. Verbalized acceptance and understanding.  Pneumococcal vaccine status: Completed during today's visit.  Covid-19 vaccine status: Declined, Education has been provided regarding the importance of this vaccine but patient still declined. Advised may receive this vaccine at local pharmacy or Health Dept.or vaccine clinic. Aware to provide a copy of the vaccination record if obtained from local pharmacy or Health Dept. Verbalized acceptance and understanding.  Qualifies for Shingles Vaccine? Yes   Zostavax completed No   Shingrix Completed?: No.    Education has been provided regarding the importance of this vaccine. Patient has been advised to call insurance company to determine out of pocket expense if they have not yet received this vaccine. Advised may also receive vaccine at local pharmacy or Health Dept. Verbalized acceptance and understanding.  Screening Tests Health Maintenance  Topic Date Due   Colonoscopy  05/17/2020   OPHTHALMOLOGY EXAM  09/07/2021   Diabetic kidney evaluation - Urine ACR  10/31/2022   Medicare Annual Wellness (AWV)  11/25/2022   Zoster Vaccines- Shingrix (1 of 2) 01/06/2023 (Originally 08/14/1964)   INFLUENZA VACCINE  12/10/2022   HEMOGLOBIN A1C   04/03/2023   MAMMOGRAM  06/24/2023   Diabetic kidney evaluation - eGFR measurement  10/01/2023   FOOT EXAM  10/06/2023   Pneumonia Vaccine 31+ Years old  Completed   DEXA SCAN  Completed   Hepatitis C Screening  Completed   HPV VACCINES  Aged Out   DTaP/Tdap/Td  Discontinued   COVID-19 Vaccine  Discontinued    Health Maintenance  Health Maintenance Due  Topic Date Due   Colonoscopy  05/17/2020   OPHTHALMOLOGY EXAM  09/07/2021   Diabetic kidney evaluation - Urine ACR  10/31/2022   Medicare Annual Wellness (AWV)  11/25/2022    Colorectal cancer screening: Type of screening: Colonoscopy. Completed 1/19. Repeat every 3 years Patient stated that she has an appointment set up  Mammogram status: Completed 2/24. Repeat every year  Bone Density status: Completed 8/22. Results reflect: Bone density results: OSTEOPENIA. Repeat every 2 years. Patient wants to get on the schedule of her mammograms  Lung Cancer Screening: (Low Dose CT Chest recommended if Age 59-80 years, 20 pack-year currently smoking OR have quit w/in 15years.) does not qualify.    Additional Screening:  Hepatitis C Screening: does qualify; Completed 11/19  Vision Screening: Recommended annual ophthalmology exams for early detection of glaucoma and other disorders of the eye. Is the patient up to date with their annual eye exam?  Yes  Who is the provider or what is the name of the office in which the patient attends annual eye exams? [facility] If pt is not established with a provider, would they like to be referred to a provider to establish care? No .   Dental Screening: Recommended annual dental exams for proper oral hygiene  Diabetic Foot Exam: Diabetic Foot Exam: Completed 5/24  Community Resource Referral / Chronic Care Management: CRR required this visit?  No   CCM required this visit?  No     Plan:     I have personally reviewed and noted the following in the patient's chart:   Medical and  social history Use of  alcohol, tobacco or illicit drugs  Current medications and supplements including opioid prescriptions. Patient is not currently taking opioid prescriptions. Functional ability and status Nutritional status Physical activity Advanced directives List of other physicians Hospitalizations, surgeries, and ER visits in previous 12 months Vitals Screenings to include cognitive, depression, and falls Referrals and appointments  In addition, I have reviewed and discussed with patient certain preventive protocols, quality metrics, and best practice recommendations. A written personalized care plan for preventive services as well as general preventive health recommendations were provided to patient.     Sydell Axon, LPN   9/56/2130   After Visit Summary: (Declined) Due to this being a telephonic visit, with patients personalized plan was offered to patient but patient Declined AVS at this time   Nurse Notes: None

## 2022-12-09 NOTE — Patient Instructions (Signed)
Lindsey Bentley , Thank you for taking time to come for your Medicare Wellness Visit. I appreciate your ongoing commitment to your health goals. Please review the following plan we discussed and let me know if I can assist you in the future.   Referrals/Orders/Follow-Ups/Clinician Recommendations: None  This is a list of the screening recommended for you and due dates:  Health Maintenance  Topic Date Due   Colon Cancer Screening  05/17/2020   Eye exam for diabetics  09/07/2021   Yearly kidney health urinalysis for diabetes  10/31/2022   Zoster (Shingles) Vaccine (1 of 2) 01/06/2023*   Flu Shot  12/10/2022   Hemoglobin A1C  04/03/2023   Mammogram  06/24/2023   Yearly kidney function blood test for diabetes  10/01/2023   Complete foot exam   10/06/2023   Medicare Annual Wellness Visit  12/09/2023   Pneumonia Vaccine  Completed   DEXA scan (bone density measurement)  Completed   Hepatitis C Screening  Completed   HPV Vaccine  Aged Out   DTaP/Tdap/Td vaccine  Discontinued   COVID-19 Vaccine  Discontinued  *Topic was postponed. The date shown is not the original due date.    Advanced directives: (Declined) Advance directive discussed with you today. Even though you declined this today, please call our office should you change your mind, and we can give you the proper paperwork for you to fill out.  Next Medicare Annual Wellness Visit scheduled for next year: Yes 12/14/23 @ 10:30  Preventive Care 65 Years and Older, Female Preventive care refers to lifestyle choices and visits with your health care provider that can promote health and wellness. What does preventive care include? A yearly physical exam. This is also called an annual well check. Dental exams once or twice a year. Routine eye exams. Ask your health care provider how often you should have your eyes checked. Personal lifestyle choices, including: Daily care of your teeth and gums. Regular physical activity. Eating a healthy  diet. Avoiding tobacco and drug use. Limiting alcohol use. Practicing safe sex. Taking low-dose aspirin every day. Taking vitamin and mineral supplements as recommended by your health care provider. What happens during an annual well check? The services and screenings done by your health care provider during your annual well check will depend on your age, overall health, lifestyle risk factors, and family history of disease. Counseling  Your health care provider may ask you questions about your: Alcohol use. Tobacco use. Drug use. Emotional well-being. Home and relationship well-being. Sexual activity. Eating habits. History of falls. Memory and ability to understand (cognition). Work and work Astronomer. Reproductive health. Screening  You may have the following tests or measurements: Height, weight, and BMI. Blood pressure. Lipid and cholesterol levels. These may be checked every 5 years, or more frequently if you are over 46 years old. Skin check. Lung cancer screening. You may have this screening every year starting at age 92 if you have a 30-pack-year history of smoking and currently smoke or have quit within the past 15 years. Fecal occult blood test (FOBT) of the stool. You may have this test every year starting at age 14. Flexible sigmoidoscopy or colonoscopy. You may have a sigmoidoscopy every 5 years or a colonoscopy every 10 years starting at age 71. Hepatitis C blood test. Hepatitis B blood test. Sexually transmitted disease (STD) testing. Diabetes screening. This is done by checking your blood sugar (glucose) after you have not eaten for a while (fasting). You may have this done  every 1-3 years. Bone density scan. This is done to screen for osteoporosis. You may have this done starting at age 34. Mammogram. This may be done every 1-2 years. Talk to your health care provider about how often you should have regular mammograms. Talk with your health care provider about  your test results, treatment options, and if necessary, the need for more tests. Vaccines  Your health care provider may recommend certain vaccines, such as: Influenza vaccine. This is recommended every year. Tetanus, diphtheria, and acellular pertussis (Tdap, Td) vaccine. You may need a Td booster every 10 years. Zoster vaccine. You may need this after age 57. Pneumococcal 13-valent conjugate (PCV13) vaccine. One dose is recommended after age 75. Pneumococcal polysaccharide (PPSV23) vaccine. One dose is recommended after age 62. Talk to your health care provider about which screenings and vaccines you need and how often you need them. This information is not intended to replace advice given to you by your health care provider. Make sure you discuss any questions you have with your health care provider. Document Released: 05/24/2015 Document Revised: 01/15/2016 Document Reviewed: 02/26/2015 Elsevier Interactive Patient Education  2017 [employer].  Fall Prevention in the Home Falls can cause injuries. They can happen to people of all ages. There are many things you can do to make your home safe and to help prevent falls. What can I do on the outside of my home? Regularly fix the edges of walkways and driveways and fix any cracks. Remove anything that might make you trip as you walk through a door, such as a raised step or threshold. Trim any bushes or trees on the path to your home. Use bright outdoor lighting. Clear any walking paths of anything that might make someone trip, such as rocks or tools. Regularly check to see if handrails are loose or broken. Make sure that both sides of any steps have handrails. Any raised decks and porches should have guardrails on the edges. Have any leaves, snow, or ice cleared regularly. Use sand or salt on walking paths during winter. Clean up any spills in your garage right away. This includes oil or grease spills. What can I do in the bathroom? Use  night lights. Install grab bars by the toilet and in the tub and shower. Do not use towel bars as grab bars. Use non-skid mats or decals in the tub or shower. If you need to sit down in the shower, use a plastic, non-slip stool. Keep the floor dry. Clean up any water that spills on the floor as soon as it happens. Remove soap buildup in the tub or shower regularly. Attach bath mats securely with double-sided non-slip rug tape. Do not have throw rugs and other things on the floor that can make you trip. What can I do in the bedroom? Use night lights. Make sure that you have a light by your bed that is easy to reach. Do not use any sheets or blankets that are too big for your bed. They should not hang down onto the floor. Have a firm chair that has side arms. You can use this for support while you get dressed. Do not have throw rugs and other things on the floor that can make you trip. What can I do in the kitchen? Clean up any spills right away. Avoid walking on wet floors. Keep items that you use a lot in easy-to-reach places. If you need to reach something above you, use a strong step stool that has  a grab bar. Keep electrical cords out of the way. Do not use floor polish or wax that makes floors slippery. If you must use wax, use non-skid floor wax. Do not have throw rugs and other things on the floor that can make you trip. What can I do with my stairs? Do not leave any items on the stairs. Make sure that there are handrails on both sides of the stairs and use them. Fix handrails that are broken or loose. Make sure that handrails are as long as the stairways. Check any carpeting to make sure that it is firmly attached to the stairs. Fix any carpet that is loose or worn. Avoid having throw rugs at the top or bottom of the stairs. If you do have throw rugs, attach them to the floor with carpet tape. Make sure that you have a light switch at the top of the stairs and the bottom of the  stairs. If you do not have them, ask someone to add them for you. What else can I do to help prevent falls? Wear shoes that: Do not have high heels. Have rubber bottoms. Are comfortable and fit you well. Are closed at the toe. Do not wear sandals. If you use a stepladder: Make sure that it is fully opened. Do not climb a closed stepladder. Make sure that both sides of the stepladder are locked into place. Ask someone to hold it for you, if possible. Clearly mark and make sure that you can see: Any grab bars or handrails. First and last steps. Where the edge of each step is. Use tools that help you move around (mobility aids) if they are needed. These include: Canes. Walkers. Scooters. Crutches. Turn on the lights when you go into a dark area. Replace any light bulbs as soon as they burn out. Set up your furniture so you have a clear path. Avoid moving your furniture around. If any of your floors are uneven, fix them. If there are any pets around you, be aware of where they are. Review your medicines with your doctor. Some medicines can make you feel dizzy. This can increase your chance of falling. Ask your doctor what other things that you can do to help prevent falls. This information is not intended to replace advice given to you by your health care provider. Make sure you discuss any questions you have with your health care provider. Document Released: 02/21/2009 Document Revised: 10/03/2015 Document Reviewed: 06/01/2014 Elsevier Interactive Patient Education  2017 [employer].

## 2023-01-12 DIAGNOSIS — G4733 Obstructive sleep apnea (adult) (pediatric): Secondary | ICD-10-CM | POA: Diagnosis not present

## 2023-02-04 ENCOUNTER — Other Ambulatory Visit (INDEPENDENT_AMBULATORY_CARE_PROVIDER_SITE_OTHER): Payer: Medicare Other

## 2023-02-04 DIAGNOSIS — E1159 Type 2 diabetes mellitus with other circulatory complications: Secondary | ICD-10-CM | POA: Diagnosis not present

## 2023-02-04 DIAGNOSIS — E7800 Pure hypercholesterolemia, unspecified: Secondary | ICD-10-CM | POA: Diagnosis not present

## 2023-02-04 LAB — LIPID PANEL
Cholesterol: 152 mg/dL (ref 0–200)
HDL: 44 mg/dL (ref >39.00)
LDL Cholesterol: 62 mg/dL (ref 0–99)
NonHDL: 108.22
Total CHOL/HDL Ratio: 3
Triglycerides: 230 mg/dL — ABNORMAL HIGH (ref 0.0–149.0)
VLDL: 46 mg/dL — ABNORMAL HIGH (ref 0.0–40.0)

## 2023-02-04 LAB — BASIC METABOLIC PANEL
BUN: 24 mg/dL — ABNORMAL HIGH (ref 6–23)
CO2: 27 mEq/L (ref 19–32)
Calcium: 9.5 mg/dL (ref 8.4–10.5)
Chloride: 105 mEq/L (ref 96–112)
Creatinine, Ser: 1.2 mg/dL (ref 0.40–1.20)
GFR: 43.37 mL/min — ABNORMAL LOW (ref >60.00)
Glucose, Bld: 127 mg/dL — ABNORMAL HIGH (ref 70–99)
Potassium: 4.3 mEq/L (ref 3.5–5.1)
Sodium: 140 mEq/L (ref 135–145)

## 2023-02-04 LAB — HEPATIC FUNCTION PANEL
ALT: 17 U/L (ref 0–35)
AST: 13 U/L (ref 0–37)
Albumin: 4.3 g/dL (ref 3.5–5.2)
Alkaline Phosphatase: 46 U/L (ref 39–117)
Bilirubin, Direct: 0.1 mg/dL (ref 0.0–0.3)
Total Bilirubin: 0.6 mg/dL (ref 0.2–1.2)
Total Protein: 7.3 g/dL (ref 6.0–8.3)

## 2023-02-04 LAB — MICROALBUMIN / CREATININE URINE RATIO
Creatinine,U: 63.5 mg/dL
Microalb Creat Ratio: 5.3 mg/g (ref 0.0–30.0)
Microalb, Ur: 3.4 mg/dL — ABNORMAL HIGH (ref 0.0–1.9)

## 2023-02-04 LAB — HEMOGLOBIN A1C: Hgb A1c MFr Bld: 7.1 % — ABNORMAL HIGH (ref 4.6–6.5)

## 2023-02-07 ENCOUNTER — Other Ambulatory Visit: Payer: Self-pay | Admitting: Internal Medicine

## 2023-02-09 ENCOUNTER — Encounter: Payer: Self-pay | Admitting: Internal Medicine

## 2023-02-09 ENCOUNTER — Ambulatory Visit (INDEPENDENT_AMBULATORY_CARE_PROVIDER_SITE_OTHER): Payer: Medicare Other | Admitting: Internal Medicine

## 2023-02-09 VITALS — BP 126/70 | HR 71 | Temp 98.0°F | Resp 16 | Ht 64.0 in | Wt 191.0 lb

## 2023-02-09 DIAGNOSIS — I10 Essential (primary) hypertension: Secondary | ICD-10-CM | POA: Diagnosis not present

## 2023-02-09 DIAGNOSIS — G4733 Obstructive sleep apnea (adult) (pediatric): Secondary | ICD-10-CM

## 2023-02-09 DIAGNOSIS — N1831 Chronic kidney disease, stage 3a: Secondary | ICD-10-CM

## 2023-02-09 DIAGNOSIS — I739 Peripheral vascular disease, unspecified: Secondary | ICD-10-CM

## 2023-02-09 DIAGNOSIS — E7800 Pure hypercholesterolemia, unspecified: Secondary | ICD-10-CM | POA: Diagnosis not present

## 2023-02-09 DIAGNOSIS — E1159 Type 2 diabetes mellitus with other circulatory complications: Secondary | ICD-10-CM | POA: Diagnosis not present

## 2023-02-09 DIAGNOSIS — F439 Reaction to severe stress, unspecified: Secondary | ICD-10-CM

## 2023-02-09 DIAGNOSIS — J449 Chronic obstructive pulmonary disease, unspecified: Secondary | ICD-10-CM | POA: Diagnosis not present

## 2023-02-09 DIAGNOSIS — I700 Atherosclerosis of aorta: Secondary | ICD-10-CM

## 2023-02-09 DIAGNOSIS — I25119 Atherosclerotic heart disease of native coronary artery with unspecified angina pectoris: Secondary | ICD-10-CM | POA: Diagnosis not present

## 2023-02-09 DIAGNOSIS — K219 Gastro-esophageal reflux disease without esophagitis: Secondary | ICD-10-CM | POA: Diagnosis not present

## 2023-02-09 DIAGNOSIS — Z853 Personal history of malignant neoplasm of breast: Secondary | ICD-10-CM

## 2023-02-09 DIAGNOSIS — I779 Disorder of arteries and arterioles, unspecified: Secondary | ICD-10-CM

## 2023-02-09 DIAGNOSIS — W19XXXA Unspecified fall, initial encounter: Secondary | ICD-10-CM | POA: Diagnosis not present

## 2023-02-09 DIAGNOSIS — R7989 Other specified abnormal findings of blood chemistry: Secondary | ICD-10-CM

## 2023-02-09 NOTE — Progress Notes (Signed)
Subjective:    [name], female    DOB: 06-24-1945, 77 y.o.   MRN: [id number]  Patient here for  Chief Complaint  Patient presents with   Medical Management of Chronic Issues    HPI Here for follow up regarding hypercholesterolemia and hypertension.  Saw cardiology 07/14/22 - pre op evaluation.  Stable.  No change made. Had f/u with [facility] 09/10/22 - Carotid duplex today continues to show mild 1 to 39% carotid artery stenosis bilaterally. ABIs were normal at 1.28 on the right and 1.25 on the left with triphasic waveforms. Recommended continuing antiplatelet therapy and f/u in 24 months. Continues cpap. Overall she feels she is doing ok/better.  No chest pain reported.  Breathing stable.  No abdominal pain or bowel change reported.  She did fall 02/05/23.  Hit her right elbow and left knee and fell on her tail bone.  No lost of conscious.  No dizziness. No head injury. Feeling better.    Past Medical History:  Diagnosis Date   Arthritis    Breast cancer (HCC)    Breast cancer, left (HCC) 2016   LT LUMPECTOMY - TI, NO, MO - IDC, ER/PR pos, Her 2 neg, Rad tx's.    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   CHF (congestive heart failure) (HCC)    COPD (chronic obstructive pulmonary disease) (HCC)    Coronary artery disease    Depression    Diffuse cystic mastopathy 2013   DVT (deep venous thrombosis) (HCC)    Dyspnea    Elevated TSH    Family history of adverse reaction to anesthesia    Pt stated that son had a seizure with a combination of anesthesia and pain medication."   Fatty liver    GERD (gastroesophageal reflux disease)    Glaucoma    High cholesterol    History of chicken pox    Hyperglycemia    Hyperlipidemia    Hypertension    LVH (left ventricular hypertrophy)    PAC (premature atrial contraction)    Palpitations    Peripheral vascular disease (HCC)    Personal history of  radiation therapy 2016   BREAST CA   Pneumonia March 2014   Skin cancer 2013   Sleep apnea    wears CPAP set at 2.5   Wears glasses    Past Surgical History:  Procedure Laterality Date   BACK SURGERY  1986   ruptured disc   BREAST BIOPSY Left 2008   NEG   BREAST BIOPSY Left 08/20/2014   POS   BREAST BIOPSY Right 2007   NEG   BREAST EXCISIONAL BIOPSY Left 1984   NEG   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 HER2 negative.  Wide excision with SLN biopsy.  MammoSite radiation   CARPAL TUNNEL RELEASE     CATARACT EXTRACTION Right 07/2022   CATARACT EXTRACTION Left 09/2022   CHOLECYSTECTOMY  2006   COLONOSCOPY  2010   Dr. Evette Cristal   COLONOSCOPY WITH PROPOFOL N/A 05/17/2017   Procedure: COLONOSCOPY WITH PROPOFOL;  Surgeon: Scot Jun, MD;  Location: [facility] ENDOSCOPY;  Service: Endoscopy;  Laterality: N/A;   DILATION AND CURETTAGE OF UTERUS     ESOPHAGOGASTRODUODENOSCOPY (EGD) WITH PROPOFOL N/A 05/17/2017   Procedure: ESOPHAGOGASTRODUODENOSCOPY (EGD) WITH PROPOFOL;  Surgeon: Scot Jun, MD;  Location: [facility] ENDOSCOPY;  Service: Endoscopy;  Laterality: N/A;   LAPAROTOMY FOR REMOVAL TUMOR LUMBAR PLEXES     Leg stent  2011   MOLE REMOVAL  2013   15 removed   POSTERIOR CERVICAL LAMINECTOMY Left 10/15/2015   Procedure: Left Cervical four- five Hemilaminectomy/Remove mass;  Surgeon: Hilda Lias, MD;  Location: [facility] NEURO ORS;  Service: Neurosurgery;  Laterality: Left;  Left C4-5 Hemilaminectomy/Remove mass   Family History  Problem Relation Age of Onset   Cancer Father        Prostate   Diabetes Father    Cerebrovascular Accident Father    Hypertension Mother    AAA (abdominal aortic aneurysm) Mother    Hyperlipidemia Other        Parent   Miscarriages / Stillbirths Other        Parent   Hypertension Other        parent   Heart disease Other        Parent   Breast cancer Maternal Aunt 5   Social  History   Socioeconomic History   Marital status: Married    Spouse name: Not on file   Number of children: 3   Years of education: 12   Highest education level: Not on file  Occupational History   Occupation: Caregiver   Tobacco Use   Smoking status: Never   Smokeless tobacco: Never  Substance and Sexual Activity   Alcohol use: No    Alcohol/week: 0.0 standard drinks of alcohol   Drug use: No   Sexual activity: Never  Other Topics Concern   Not on file  Social History Narrative   Regular exercise-mo   Caffeine Use-no   Social Determinants of Health   Financial Resource Strain: Low Risk  (12/09/2022)   Overall Financial Resource Strain (CARDIA)    Difficulty of Paying Living Expenses: Not hard at all  Food Insecurity: No Food Insecurity (12/09/2022)   Hunger Vital Sign    Worried About Running Out of Food in the Last Year: Never true    Ran Out of Food in the Last Year: Never true  Transportation Needs: No Transportation Needs (12/09/2022)   PRAPARE - Administrator, Civil Service (Medical): No    Lack of Transportation (Non-Medical): No  Physical Activity: Inactive (12/09/2022)   Exercise Vital Sign    Days of Exercise per Week: 0 days    Minutes of Exercise per Session: 0 min  Stress: No Stress Concern Present (12/09/2022)   [employer] of Occupational Health - Occupational Stress Questionnaire    Feeling of Stress : Only a little  Social Connections: Moderately Integrated (12/09/2022)   Social Connection and Isolation Panel [NHANES]    Frequency of Communication with Friends and Family: More than three times a week    Frequency of Social Gatherings with Friends and Family: More than three times a week    Attends Religious Services: More than 4 times per year    Active Member of [employer] or Organizations: Yes    Attends Banker Meetings: More than 4 times per year    Marital Status: Widowed     Review of Systems  Constitutional:  Negative  for appetite change and unexpected weight change.  HENT:  Negative for congestion and sinus pressure.   Respiratory:  Negative for cough, chest tightness and shortness of breath.   Cardiovascular:  Negative for chest pain and palpitations.  Gastrointestinal:  Negative for abdominal pain, diarrhea, nausea and vomiting.  Genitourinary:  Negative for difficulty urinating and dysuria.  Musculoskeletal:  Negative for joint swelling and myalgias.       Pain tail bone - as outlined.   Skin:  Negative for color change and rash.  Neurological:  Negative for dizziness and headaches.  Psychiatric/Behavioral:  Negative for agitation and dysphoric mood.        Objective:     BP 126/70   Pulse 71   Temp 98 F (36.7 C)   Resp 16   Ht 5\' 4"  (1.626 m)   Wt 191 lb (86.6 kg)   SpO2 98%   BMI 32.79 kg/m  Wt Readings from Last 3 Encounters:  02/09/23 191 lb (86.6 kg)  12/09/22 185 lb (83.9 kg)  10/06/22 187 lb (84.8 kg)    Physical Exam Vitals reviewed.  Constitutional:      General: She is not in acute distress.    Appearance: Normal appearance.  HENT:     Head: Normocephalic and atraumatic.     Right Ear: External ear normal.     Left Ear: External ear normal.  Eyes:     General: No scleral icterus.       Right eye: No discharge.        Left eye: No discharge.     Conjunctiva/sclera: Conjunctivae normal.  Neck:     Thyroid: No thyromegaly.  Cardiovascular:     Rate and Rhythm: Normal rate and regular rhythm.  Pulmonary:     Effort: No respiratory distress.     Breath sounds: Normal breath sounds. No wheezing.  Abdominal:     General: Bowel sounds are normal.     Palpations: Abdomen is soft.     Tenderness: There is no abdominal tenderness.  Musculoskeletal:        General: No swelling or tenderness.     Cervical back: Neck supple. No tenderness.  Lymphadenopathy:     Cervical: No cervical adenopathy.  Skin:    Findings: No erythema or rash.  Neurological:     Mental  Status: She is alert.  Psychiatric:        Mood and Affect: Mood normal.        Behavior: Behavior normal.      Outpatient Encounter Medications as of 02/09/2023  Medication Sig   acetaminophen (TYLENOL) 650 MG CR tablet Take 650 mg by mouth as needed for pain.   albuterol (VENTOLIN HFA) 108 (90 Base) MCG/ACT inhaler Inhale 2 puffs into the lungs every 6 (six) hours as needed for wheezing or shortness of breath.   aspirin 325 MG tablet Take 325 mg by mouth daily.   carvedilol (COREG) 12.5 MG tablet TAKE 1 TABLET(12.5 MG) BY MOUTH TWICE DAILY WITH MEALS   Cholecalciferol 25 MCG (1000 UT) tablet Take 1,000 Units by mouth daily.    empagliflozin (JARDIANCE) 25 MG TABS tablet Take 1 tablet (25 mg total) by mouth daily before breakfast.   glucose blood (ONETOUCH VERIO) test strip Use to check blood sugars up to three times daily.   hydrALAZINE (APRESOLINE) 10 MG tablet TAKE 1 TABLET(10 MG) BY MOUTH THREE TIMES DAILY   losartan-hydrochlorothiazide (HYZAAR) 100-25 MG tablet TAKE 1 TABLET BY MOUTH DAILY   meclizine (ANTIVERT) 25 MG tablet Take 1 tablet (25 mg total) by mouth daily as needed for dizziness.   omeprazole (PRILOSEC) 40 MG capsule TAKE 1 CAPSULE(40 MG) BY MOUTH DAILY   OneTouch Delica Lancets 30G MISC Use to check blood sugars up to 3 times daily.  rosuvastatin (CRESTOR) 40 MG tablet TAKE 1 TABLET(40 MG) BY MOUTH DAILY   vitamin E 400 UNIT capsule Take 400 Units by mouth daily.   No facility-administered encounter medications on file as of 02/09/2023.     Lab Results  Component Value Date   WBC 6.1 02/19/2022   HGB 12.8 02/19/2022   HCT 37.7 02/19/2022   PLT 172.0 02/19/2022   GLUCOSE 127 (H) 02/04/2023   CHOL 152 02/04/2023   TRIG 230.0 (H) 02/04/2023   HDL 44.00 02/04/2023   LDLDIRECT 71.0 02/19/2022   LDLCALC 62 02/04/2023   ALT 17 02/04/2023   AST 13 02/04/2023   NA 140 02/04/2023   K 4.3 02/04/2023   CL 105 02/04/2023   CREATININE 1.20 02/04/2023   BUN 24 (H)  02/04/2023   CO2 27 02/04/2023   TSH 5.33 05/27/2022   INR 0.9 07/13/2012   HGBA1C 7.1 (H) 02/04/2023   MICROALBUR 3.4 (H) 02/04/2023    US SOFT TISSUE HEAD & NECK (NON-THYROID)  Result Date: 10/24/2022 CLINICAL DATA:  Right submandibular gland swelling EXAM: ULTRASOUND OF HEAD/NECK SOFT TISSUES TECHNIQUE: Ultrasound examination of the head and neck soft tissues was performed in the area of clinical concern. COMPARISON:  None available FINDINGS: Targeted sonographic evaluation of the area of concern in the right submandibular region shows no discrete abnormality. IMPRESSION: No discrete sonographic abnormality identified in the region of concern in the right submandibular region. Electronically Signed   By: Acquanetta Belling M.D.   On: 10/24/2022 08:14       Assessment & Plan:  Aortic atherosclerosis (HCC) Assessment & Plan: Continue crestor.    Abnormal liver function test Assessment & Plan: Fatty liver found on previous CT.  Diet and exercise.  Follow liver function tests. Recent labs revealed normal liver function tests.     Coronary artery disease involving native coronary artery of native heart with angina pectoris [facility]) Assessment & Plan: Saw cardiology 07/14/22 - pre op evaluation.  Stable.  No change made. Continue risk factor modification.    Bilateral carotid artery disease, unspecified type [facility]) Assessment & Plan: Evaluated [facility] 09/2022 1-39% carotid ultrasound.  Recommended f/u in 2 years.     Stage 3a chronic kidney disease (HCC) Assessment & Plan: Avoid anti-inflammatories..  Stay hydrated. Urine - no protein.  Follow metabolic panel. Continue ARB   Chronic obstructive pulmonary disease, unspecified COPD type (HCC) Assessment & Plan: Breathing overall stable.  No sob.  Follow.    Diabetes mellitus with cardiac complication [facility]) Assessment & Plan: On jardiance.  A1c just checked 7.1. Discussed -  Low carb diet and exercise.  Follow met b and a1c. Discussed  additional medication.  Desires not to add medication at this time.  Wants to work on diet and exercise.  Eye exam - 01/2022.    Gastroesophageal reflux disease without esophagitis Assessment & Plan: No upper symptom reported.  On prilosec.    History of breast cancer Assessment & Plan: Completed femara.  Mammogram 06/23/22 - Birads I.    Hypercholesterolemia Assessment & Plan: On Crestor.  Low-cholesterol diet and exercise.  Follow lipid panel and liver function tests.   Lab Results  Component Value Date   CHOL 152 02/04/2023   HDL 44.00 02/04/2023   LDLCALC 62 02/04/2023   LDLDIRECT 71.0 02/19/2022   TRIG 230.0 (H) 02/04/2023   CHOLHDL 3 02/04/2023      Primary hypertension Assessment & Plan: Continue losartan/hctz, coreg and hydralazine.  Follow pressures.  Follow metabolic panel. Blood  pressures as outlined.   OSA on CPAP Assessment & Plan: Continue cpap.    Peripheral vascular disease (HCC) Assessment & Plan: Has seen [facility].  Continue aspirin and Crestor.    Stress Assessment & Plan: Increased stress.  Has good support.  Does not feel needs any further intervention. Follow.    Fall, initial encounter Assessment & Plan: Recent fall as outlined.  Pain - sacrum/coccyx.  Some bruisung right elbow. Good rom. Discussed further evaluation.  Wants to hold on any further testing.  Follow.  Notify if increased pain/problems.       Dale Coamo, MD

## 2023-02-13 ENCOUNTER — Encounter: Payer: Self-pay | Admitting: Internal Medicine

## 2023-02-13 DIAGNOSIS — W19XXXA Unspecified fall, initial encounter: Secondary | ICD-10-CM | POA: Insufficient documentation

## 2023-02-13 NOTE — Assessment & Plan Note (Signed)
Saw cardiology 07/14/22 - pre op evaluation.  Stable.  No change made. Continue risk factor modification.

## 2023-02-13 NOTE — Assessment & Plan Note (Signed)
On jardiance.  A1c just checked 7.1. Discussed -  Low carb diet and exercise.  Follow met b and a1c. Discussed additional medication.  Desires not to add medication at this time.  Wants to work on diet and exercise.  Eye exam - 01/2022.

## 2023-02-13 NOTE — Assessment & Plan Note (Signed)
Continue crestor 

## 2023-02-13 NOTE — Assessment & Plan Note (Signed)
On Crestor.  Low-cholesterol diet and exercise.  Follow lipid panel and liver function tests.   Lab Results  Component Value Date   CHOL 152 02/04/2023   HDL 44.00 02/04/2023   LDLCALC 62 02/04/2023   LDLDIRECT 71.0 02/19/2022   TRIG 230.0 (H) 02/04/2023   CHOLHDL 3 02/04/2023

## 2023-02-13 NOTE — Assessment & Plan Note (Signed)
Continue cpap.  

## 2023-02-13 NOTE — Assessment & Plan Note (Signed)
Has seen [facility].  Continue aspirin and Crestor.

## 2023-02-13 NOTE — Assessment & Plan Note (Signed)
Completed femara.  Mammogram 06/23/22 - Birads I.

## 2023-02-13 NOTE — Assessment & Plan Note (Signed)
Evaluated [facility] 09/2022 1-39% carotid ultrasound.  Recommended f/u in 2 years.

## 2023-02-13 NOTE — Assessment & Plan Note (Signed)
No upper symptom reported.  On prilosec.  

## 2023-02-13 NOTE — Assessment & Plan Note (Signed)
Breathing overall stable.  No sob.  Follow.  

## 2023-02-13 NOTE — Assessment & Plan Note (Signed)
Avoid anti-inflammatories..  Stay hydrated. Urine - no protein.  Follow metabolic panel. Continue ARB 

## 2023-02-13 NOTE — Assessment & Plan Note (Signed)
Recent fall as outlined.  Pain - sacrum/coccyx.  Some bruisung right elbow. Good rom. Discussed further evaluation.  Wants to hold on any further testing.  Follow.  Notify if increased pain/problems.

## 2023-02-13 NOTE — Assessment & Plan Note (Signed)
Fatty liver found on previous CT.  Diet and exercise.  Follow liver function tests. Recent labs revealed normal liver function tests.

## 2023-02-13 NOTE — Assessment & Plan Note (Signed)
Increased stress.  Has good support.  Does not feel needs any further intervention. Follow.

## 2023-02-13 NOTE — Assessment & Plan Note (Signed)
Continue losartan/hctz, coreg and hydralazine.  Follow pressures.  Follow metabolic panel. Blood pressures as outlined.

## 2023-02-15 ENCOUNTER — Other Ambulatory Visit: Payer: Self-pay | Admitting: Internal Medicine

## 2023-02-16 DIAGNOSIS — R221 Localized swelling, mass and lump, neck: Secondary | ICD-10-CM | POA: Diagnosis not present

## 2023-02-16 DIAGNOSIS — R42 Dizziness and giddiness: Secondary | ICD-10-CM | POA: Diagnosis not present

## 2023-02-16 DIAGNOSIS — H6123 Impacted cerumen, bilateral: Secondary | ICD-10-CM | POA: Diagnosis not present

## 2023-02-17 ENCOUNTER — Other Ambulatory Visit: Payer: Self-pay | Admitting: Internal Medicine

## 2023-02-18 ENCOUNTER — Telehealth: Payer: Self-pay

## 2023-02-18 NOTE — Telephone Encounter (Signed)
Prescription Request  02/18/2023  LOV: Visit date not found  What is the name of the medication or equipment? losartan-hydrochlorothiazide (HYZAAR) 100-25 MG tablet  Have you contacted your pharmacy to request a refill? Yes   Which pharmacy would you like this sent to?  [id number] [place], [place] - [address] AT [facility] OF [address] [place] [address] Phone: [phone] Fax: [phone]   Patient notified that their request is being sent to the clinical staff for review and that they should receive a response within 2 business days.   Please advise at Mobile [phone] (mobile)  Patient states she spoke with her pharmacy and they told her that they have reached out to [place] but have not heard back.  Patient states she has one week left of her medication.

## 2023-02-18 NOTE — Telephone Encounter (Signed)
Medication has been sent to pharmacy.  °

## 2023-03-26 ENCOUNTER — Telehealth: Payer: Self-pay | Admitting: Internal Medicine

## 2023-03-26 NOTE — Telephone Encounter (Signed)
Form to Daijah to complete.  Will sign once completed.

## 2023-03-26 NOTE — Telephone Encounter (Signed)
Patient dropped off document  Medication form , to be filled out by provider. Patient requested to send it via Call Patient to pick up within 5-days. Document is located in providers folder at front office.Please advise at Mobile [phone] (mobile)

## 2023-03-30 NOTE — Telephone Encounter (Signed)
Forms faxed. Pt aware.

## 2023-04-07 ENCOUNTER — Telehealth: Payer: Self-pay

## 2023-04-09 NOTE — Telephone Encounter (Signed)
Spoke to Patient on 04-09-23 and She is in the process of Re-Enrolled for 2025 (sent APP through Provider office) 03/2023.

## 2023-04-19 ENCOUNTER — Other Ambulatory Visit: Payer: Self-pay | Admitting: Internal Medicine

## 2023-04-21 ENCOUNTER — Telehealth: Payer: Self-pay | Admitting: Internal Medicine

## 2023-04-21 ENCOUNTER — Other Ambulatory Visit: Payer: Self-pay

## 2023-04-21 MED ORDER — ROSUVASTATIN CALCIUM 40 MG PO TABS: 40.0000 mg | ORAL_TABLET | Freq: Every day | ORAL | 3 refills | Status: DC

## 2023-04-21 NOTE — Telephone Encounter (Signed)
Refill re-sent  

## 2023-04-21 NOTE — Telephone Encounter (Signed)
Patient called and is needing a refill on rosuvastatin (CRESTOR) 40 MG tablet   I did advise patient a refill was [facility] on 12/10 she states she spoke with pharmacy this morning and they are saying they have not received anything. Please resend.

## 2023-04-22 ENCOUNTER — Telehealth: Payer: Self-pay | Admitting: Internal Medicine

## 2023-04-22 DIAGNOSIS — M47816 Spondylosis without myelopathy or radiculopathy, lumbar region: Secondary | ICD-10-CM | POA: Diagnosis not present

## 2023-04-22 NOTE — Telephone Encounter (Signed)
Given previous fall and increased pain - 9/10 pain - I recommend for her to be evaluated - ortho - she can go today to [facility] - across from [facility] on [address]

## 2023-04-22 NOTE — Telephone Encounter (Signed)
Pt called stating she would like for someone to call her concerning her back

## 2023-06-01 ENCOUNTER — Telehealth: Payer: Self-pay | Admitting: Internal Medicine

## 2023-06-01 NOTE — Telephone Encounter (Signed)
Patient need lab orders.

## 2023-06-03 ENCOUNTER — Other Ambulatory Visit: Payer: Self-pay | Admitting: Internal Medicine

## 2023-06-03 NOTE — Telephone Encounter (Signed)
Copied from CRM [id number]. Topic: Clinical - Medication Refill >> Jun 03, 2023 11:49 AM [name] wrote: Most Recent Primary Care Visit:  Provider: Dale Marked Tree  Department: [facility]  Visit Type: OFFICE VISIT  Date: 02/09/2023  Medication: hydrALAZINE (APRESOLINE) 10 MG tablet 90 Day order   Has the patient contacted their pharmacy? yes (Agent: If no, request that the patient contact the pharmacy for the refill. If patient does not wish to contact the pharmacy document the reason why and proceed with request.) (Agent: If yes, when and what did the pharmacy advise?) Need authorization  Is this the correct pharmacy for this prescription? Yes [facility] If no, delete pharmacy and type the correct one.  This is the patient's preferred pharmacy:  [id number] [place], [place] - [address] AT [facility] OF [address] [place] [address] Phone: [phone] Fax: [phone]  [facility] - [place], [place] - [address] [address] [address] [place] [address] Phone: [phone] Fax: [phone]   Has the prescription been filled recently? No  Is the patient out of the medication? No - Has enough for 5 days  Has the patient been seen for an appointment in the last year OR does the patient have an upcoming appointment? Yes  Can we respond through MyChart? No  Agent: Please be advised that Rx refills may take up to 3 business days. We ask that you follow-up with your pharmacy.

## 2023-06-03 NOTE — Telephone Encounter (Signed)
Last Fill: 10/06/22  Last OV:02/09/2023 Next OV: 06/14/2023  Routing to provider for review/authorization

## 2023-06-08 ENCOUNTER — Other Ambulatory Visit: Payer: Self-pay

## 2023-06-08 DIAGNOSIS — E1159 Type 2 diabetes mellitus with other circulatory complications: Secondary | ICD-10-CM

## 2023-06-08 DIAGNOSIS — E7800 Pure hypercholesterolemia, unspecified: Secondary | ICD-10-CM

## 2023-06-08 DIAGNOSIS — E041 Nontoxic single thyroid nodule: Secondary | ICD-10-CM

## 2023-06-08 NOTE — Telephone Encounter (Signed)
Labs placed.

## 2023-06-10 ENCOUNTER — Other Ambulatory Visit: Payer: Medicare Other

## 2023-06-10 DIAGNOSIS — E1159 Type 2 diabetes mellitus with other circulatory complications: Secondary | ICD-10-CM

## 2023-06-10 DIAGNOSIS — E041 Nontoxic single thyroid nodule: Secondary | ICD-10-CM | POA: Diagnosis not present

## 2023-06-10 DIAGNOSIS — E7800 Pure hypercholesterolemia, unspecified: Secondary | ICD-10-CM

## 2023-06-10 LAB — HEMOGLOBIN A1C: Hgb A1c MFr Bld: 7.5 % — ABNORMAL HIGH (ref 4.6–6.5)

## 2023-06-10 LAB — LIPID PANEL
Cholesterol: 126 mg/dL (ref 0–200)
HDL: 38.8 mg/dL — ABNORMAL LOW (ref >39.00)
LDL Cholesterol: 55 mg/dL (ref 0–99)
NonHDL: 87.2
Total CHOL/HDL Ratio: 3
Triglycerides: 163 mg/dL — ABNORMAL HIGH (ref 0.0–149.0)
VLDL: 32.6 mg/dL (ref 0.0–40.0)

## 2023-06-10 LAB — HEPATIC FUNCTION PANEL
ALT: 14 U/L (ref 0–35)
AST: 14 U/L (ref 0–37)
Albumin: 4.1 g/dL (ref 3.5–5.2)
Alkaline Phosphatase: 45 U/L (ref 39–117)
Bilirubin, Direct: 0.1 mg/dL (ref 0.0–0.3)
Total Bilirubin: 0.5 mg/dL (ref 0.2–1.2)
Total Protein: 7 g/dL (ref 6.0–8.3)

## 2023-06-10 LAB — BASIC METABOLIC PANEL
BUN: 24 mg/dL — ABNORMAL HIGH (ref 6–23)
CO2: 27 meq/L (ref 19–32)
Calcium: 8.8 mg/dL (ref 8.4–10.5)
Chloride: 105 meq/L (ref 96–112)
Creatinine, Ser: 1.27 mg/dL — ABNORMAL HIGH (ref 0.40–1.20)
GFR: 40.42 mL/min — ABNORMAL LOW (ref >60.00)
Glucose, Bld: 128 mg/dL — ABNORMAL HIGH (ref 70–99)
Potassium: 3.8 meq/L (ref 3.5–5.1)
Sodium: 142 meq/L (ref 135–145)

## 2023-06-10 LAB — TSH: TSH: 3.85 u[IU]/mL (ref 0.35–5.50)

## 2023-06-14 ENCOUNTER — Encounter: Payer: Self-pay | Admitting: Internal Medicine

## 2023-06-14 ENCOUNTER — Ambulatory Visit (INDEPENDENT_AMBULATORY_CARE_PROVIDER_SITE_OTHER): Payer: Medicare Other | Admitting: Internal Medicine

## 2023-06-14 VITALS — BP 126/68 | HR 67 | Temp 98.0°F | Resp 16 | Ht 65.0 in | Wt 189.2 lb

## 2023-06-14 DIAGNOSIS — E041 Nontoxic single thyroid nodule: Secondary | ICD-10-CM | POA: Diagnosis not present

## 2023-06-14 DIAGNOSIS — I25119 Atherosclerotic heart disease of native coronary artery with unspecified angina pectoris: Secondary | ICD-10-CM | POA: Diagnosis not present

## 2023-06-14 DIAGNOSIS — I739 Peripheral vascular disease, unspecified: Secondary | ICD-10-CM

## 2023-06-14 DIAGNOSIS — G4733 Obstructive sleep apnea (adult) (pediatric): Secondary | ICD-10-CM

## 2023-06-14 DIAGNOSIS — Z853 Personal history of malignant neoplasm of breast: Secondary | ICD-10-CM

## 2023-06-14 DIAGNOSIS — Z1231 Encounter for screening mammogram for malignant neoplasm of breast: Secondary | ICD-10-CM

## 2023-06-14 DIAGNOSIS — I10 Essential (primary) hypertension: Secondary | ICD-10-CM

## 2023-06-14 DIAGNOSIS — I700 Atherosclerosis of aorta: Secondary | ICD-10-CM | POA: Diagnosis not present

## 2023-06-14 DIAGNOSIS — E1159 Type 2 diabetes mellitus with other circulatory complications: Secondary | ICD-10-CM

## 2023-06-14 DIAGNOSIS — R7989 Other specified abnormal findings of blood chemistry: Secondary | ICD-10-CM

## 2023-06-14 DIAGNOSIS — I779 Disorder of arteries and arterioles, unspecified: Secondary | ICD-10-CM | POA: Diagnosis not present

## 2023-06-14 DIAGNOSIS — K219 Gastro-esophageal reflux disease without esophagitis: Secondary | ICD-10-CM | POA: Diagnosis not present

## 2023-06-14 DIAGNOSIS — F439 Reaction to severe stress, unspecified: Secondary | ICD-10-CM | POA: Diagnosis not present

## 2023-06-14 DIAGNOSIS — N1831 Chronic kidney disease, stage 3a: Secondary | ICD-10-CM | POA: Diagnosis not present

## 2023-06-14 DIAGNOSIS — Z860100 Personal history of colon polyps, unspecified: Secondary | ICD-10-CM

## 2023-06-14 DIAGNOSIS — J449 Chronic obstructive pulmonary disease, unspecified: Secondary | ICD-10-CM

## 2023-06-14 DIAGNOSIS — E7800 Pure hypercholesterolemia, unspecified: Secondary | ICD-10-CM

## 2023-06-14 MED ORDER — ALBUTEROL SULFATE HFA 108 (90 BASE) MCG/ACT IN AERS: 2.0000 | INHALATION_SPRAY | Freq: Four times a day (QID) | RESPIRATORY_TRACT | 1 refills | Status: AC | PRN

## 2023-06-14 NOTE — Progress Notes (Unsigned)
Subjective:    [name], female    DOB: Aug 07, 1945, 78 y.o.   MRN: [id number]  Patient here for  Chief Complaint  Patient presents with   Medical Management of Chronic Issues    HPI Here for a scheduled follow up -  follow up regarding hypercholesterolemia and hypertension.  Saw cardiology 07/14/22 - pre op evaluation.  Stable.  No change made. Had f/u with [facility] 09/10/22 - Carotid duplex today continues to show mild 1 to 39% carotid artery stenosis bilaterally. ABIs were normal at 1.28 on the right and 1.25 on the left with triphasic waveforms. Recommended continuing antiplatelet therapy and f/u in 24 months. Continues cpap.    Past Medical History:  Diagnosis Date   Arthritis    Breast cancer (HCC)    Breast cancer, left (HCC) 2016   LT LUMPECTOMY - TI, NO, MO - IDC, ER/PR pos, Her 2 neg, Rad tx's.    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   CHF (congestive heart failure) (HCC)    COPD (chronic obstructive pulmonary disease) (HCC)    Coronary artery disease    Depression    Diffuse cystic mastopathy 2013   DVT (deep venous thrombosis) (HCC)    Dyspnea    Elevated TSH    Family history of adverse reaction to anesthesia    Pt stated that son had a seizure with a combination of anesthesia and pain medication."   Fatty liver    GERD (gastroesophageal reflux disease)    Glaucoma    High cholesterol    History of chicken pox    Hyperglycemia    Hyperlipidemia    Hypertension    LVH (left ventricular hypertrophy)    PAC (premature atrial contraction)    Palpitations    Peripheral vascular disease (HCC)    Personal history of radiation therapy 2016   BREAST CA   Pneumonia March 2014   Skin cancer 2013   Sleep apnea    wears CPAP set at 2.5   Wears glasses    Past Surgical History:  Procedure Laterality Date   BACK SURGERY  1986   ruptured disc   BREAST BIOPSY Left 2008    NEG   BREAST BIOPSY Left 08/20/2014   POS   BREAST BIOPSY Right 2007   NEG   BREAST EXCISIONAL BIOPSY Left 1984   NEG   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 HER2 negative.  Wide excision with SLN biopsy.  MammoSite radiation   CARPAL TUNNEL RELEASE     CATARACT EXTRACTION Right 07/2022   CATARACT EXTRACTION Left 09/2022   CHOLECYSTECTOMY  2006   COLONOSCOPY  2010   Dr. Evette Cristal   COLONOSCOPY WITH PROPOFOL N/A 05/17/2017   Procedure: COLONOSCOPY WITH PROPOFOL;  Surgeon: Scot Jun, MD;  Location[facility] END[facility]COPY;  Service: Endoscopy;  Laterality: N/A;   DILATION AND CURETTAGE OF UTERUS     ESOPHAGOGASTRODUODENOSCOPY (EGD) WITH PROPOFOL N/A 05/17/2017   Procedure: ESOPHAGOGASTRODUODENOSCOPY (EGD) WITH PROPOFOL;  Surgeon: Scot Jun, MD;  Location: [facility] ENDOSCOPY;  Service: Endoscopy;  Laterality: N/A;   LAPAROTOMY FOR REMOVAL TUMOR LUMBAR PLEXES     Leg stent  2011   MOLE REMOVAL  2013   15 removed   POSTERIOR CERVICAL LAMINECTOMY Left 10/15/2015   Procedure: Left Cervical four-  five Hemilaminectomy/Remove mass;  Surgeon: Hilda Lias, MD;  Location: [facility];  Service: Neurosurgery;  Laterality: Left;  Left C4-5 Hemilaminectomy/Remove mass   Family History  Problem Relation Age of Onset   Cancer Father        Prostate   Diabetes Father    Cerebrovascular Accident Father    Hypertension Mother    AAA (abdominal aortic aneurysm) Mother    Hyperlipidemia Other        Parent   Miscarriages / Stillbirths Other        Parent   Hypertension Other        parent   Heart disease Other        Parent   Breast cancer Maternal Aunt 64   Social History   Socioeconomic History   Marital status: Married    Spouse name: Not on file   Number of children: 3   Years of education: 12   Highest education level: Not on file  Occupational History   Occupation: Caregiver   Tobacco Use   Smoking status:  Never   Smokeless tobacco: Never  Substance and Sexual Activity   Alcohol use: No    Alcohol/week: 0.0 standard drinks of alcohol   Drug use: No   Sexual activity: Never  Other Topics Concern   Not on file  Social History Narrative   Regular exercise-mo   Caffeine Use-no   Social Drivers of Health   Financial Resource Strain: Low Risk  (12/09/2022)   Overall Financial Resource Strain (CARDIA)    Difficulty of Paying Living Expenses: Not hard at all  Food Insecurity: No Food Insecurity (12/09/2022)   Hunger Vital Sign    Worried About Running Out of Food in the Last Year: Never true    Ran Out of Food in the Last Year: Never true  Transportation Needs: No Transportation Needs (12/09/2022)   PRAPARE - Administrator, Civil Service (Medical): No    Lack of Transportation (Non-Medical): No  Physical Activity: Inactive (12/09/2022)   Exercise Vital Sign    Days of Exercise per Week: 0 days    Minutes of Exercise per Session: 0 min  Stress: No Stress Concern Present (12/09/2022)   [employer] of Occupational Health - Occupational Stress Questionnaire    Feeling of Stress : Only a little  Social Connections: Moderately Integrated (12/09/2022)   Social Connection and Isolation Panel [NHANES]    Frequency of Communication with Friends and Family: More than three times a week    Frequency of Social Gatherings with Friends and Family: More than three times a week    Attends Religious Services: More than 4 times per year    Active Member of [employer] or Organizations: Yes    Attends Banker Meetings: More than 4 times per year    Marital Status: Widowed     Review of Systems     Objective:     BP 126/68   Pulse 67   Temp 98 F (36.7 C)   Resp 16   Ht 5\' 5"  (1.651 m)   Wt 189 lb 3.2 oz (85.8 kg)   SpO2 98%   BMI 31.48 kg/m  Wt Readings from Last 3 Encounters:  06/14/23 189 lb 3.2 oz (85.8 kg)  02/09/23 191 lb (86.6 kg)  12/09/22 185 lb (83.9 kg)     Physical Exam  {Perform Simple Foot Exam  Perform Detailed exam:1} {Insert foot Exam (Optional):30965}   Outpatient Encounter Medications as  of 06/14/2023  Medication Sig   acetaminophen (TYLENOL) 650 MG CR tablet Take 650 mg by mouth as needed for pain.   albuterol (VENTOLIN HFA) 108 (90 Base) MCG/ACT inhaler Inhale 2 puffs into the lungs every 6 (six) hours as needed for wheezing or shortness of breath.   aspirin 325 MG tablet Take 325 mg by mouth daily.   carvedilol (COREG) 12.5 MG tablet TAKE 1 TABLET(12.5 MG) BY MOUTH TWICE DAILY WITH MEALS   Cholecalciferol 25 MCG (1000 UT) tablet Take 1,000 Units by mouth daily.    empagliflozin (JARDIANCE) 25 MG TABS tablet Take 1 tablet (25 mg total) by mouth daily before breakfast.   glucose blood (ONETOUCH VERIO) test strip Use to check blood sugars up to three times daily.   hydrALAZINE (APRESOLINE) 10 MG tablet TAKE 1 TABLET(10 MG) BY MOUTH THREE TIMES DAILY   losartan-hydrochlorothiazide (HYZAAR) 100-25 MG tablet TAKE 1 TABLET BY MOUTH DAILY   meclizine (ANTIVERT) 25 MG tablet Take 1 tablet (25 mg total) by mouth daily as needed for dizziness.   omeprazole (PRILOSEC) 40 MG capsule TAKE 1 CAPSULE(40 MG) BY MOUTH DAILY   OneTouch Delica Lancets 30G MISC Use to check blood sugars up to 3 times daily.   rosuvastatin (CRESTOR) 40 MG tablet Take 1 tablet (40 mg total) by mouth daily.   vitamin E 400 UNIT capsule Take 400 Units by mouth daily.   [DISCONTINUED] albuterol (VENTOLIN HFA) 108 (90 Base) MCG/ACT inhaler Inhale 2 puffs into the lungs every 6 (six) hours as needed for wheezing or shortness of breath.   No facility-administered encounter medications on file as of 06/14/2023.     Lab Results  Component Value Date   WBC 6.1 02/19/2022   HGB 12.8 02/19/2022   HCT 37.7 02/19/2022   PLT 172.0 02/19/2022   GLUCOSE 128 (H) 06/10/2023   CHOL 126 06/10/2023   TRIG 163.0 (H) 06/10/2023   HDL 38.80 (L) 06/10/2023   LDLDIRECT 71.0 02/19/2022    LDLCALC 55 06/10/2023   ALT 14 06/10/2023   AST 14 06/10/2023   NA 142 06/10/2023   K 3.8 06/10/2023   CL 105 06/10/2023   CREATININE 1.27 (H) 06/10/2023   BUN 24 (H) 06/10/2023   CO2 27 06/10/2023   TSH 3.85 06/10/2023   INR 0.9 07/13/2012   HGBA1C 7.5 (H) 06/10/2023   MICROALBUR 3.4 (H) 02/04/2023    US SOFT TISSUE HEAD & NECK (NON-THYROID) Result Date: 10/24/2022 CLINICAL DATA:  Right submandibular gland swelling EXAM: ULTRASOUND OF HEAD/NECK SOFT TISSUES TECHNIQUE: Ultrasound examination of the head and neck soft tissues was performed in the area of clinical concern. COMPARISON:  None available FINDINGS: Targeted sonographic evaluation of the area of concern in the right submandibular region shows no discrete abnormality. IMPRESSION: No discrete sonographic abnormality identified in the region of concern in the right submandibular region. Electronically Signed   By: Acquanetta Belling M.D.   On: 10/24/2022 08:14       Assessment & Plan:  Visit for screening mammogram -     3D Screening Mammogram, Left and Right; Future  Hypercholesterolemia -     Lipid panel; Future -     Hepatic function panel; Future -     Basic metabolic panel; Future  Type 2 diabetes mellitus with other circulatory complication, without long-term current use of insulin (HCC) -     Hemoglobin A1c; Future  Other orders -     Albuterol Sulfate HFA; Inhale 2 puffs into the lungs every  6 (six) hours as needed for wheezing or shortness of breath.  Dispense: 18 g; Refill: 1     Dale , MD

## 2023-06-19 ENCOUNTER — Encounter: Payer: Self-pay | Admitting: Internal Medicine

## 2023-06-19 NOTE — Assessment & Plan Note (Signed)
 Completed femara .  Mammogram 06/23/22 - Birads I. Schedule f/u mammogram.

## 2023-06-19 NOTE — Assessment & Plan Note (Signed)
No upper symptom reported.  On prilosec.  

## 2023-06-19 NOTE — Assessment & Plan Note (Signed)
Breathing overall stable.  No sob.  Follow.  

## 2023-06-19 NOTE — Assessment & Plan Note (Signed)
 Continue cpap.

## 2023-06-19 NOTE — Assessment & Plan Note (Signed)
Evaluated [facility] 09/2022 1-39% carotid ultrasound.  Recommended f/u in 2 years.

## 2023-06-19 NOTE — Assessment & Plan Note (Signed)
Evaluated by Dr Solum.  Biopsy negative.  Follow tsh.   

## 2023-06-19 NOTE — Assessment & Plan Note (Signed)
 Continue crestor

## 2023-06-19 NOTE — Assessment & Plan Note (Signed)
Fatty liver found on previous CT.  Diet and exercise.  Follow liver function tests. Recent labs revealed normal liver function tests.

## 2023-06-19 NOTE — Assessment & Plan Note (Signed)
Increased stress.  She feels she is handling things relatively well.  Does not feel needs any further intervention.  Follow.

## 2023-06-19 NOTE — Assessment & Plan Note (Signed)
Has seen [facility].  Continue aspirin and Crestor.

## 2023-06-19 NOTE — Assessment & Plan Note (Signed)
 No chest pain.  Continue risk factor modification.

## 2023-06-19 NOTE — Assessment & Plan Note (Signed)
Avoid anti-inflammatories..  Stay hydrated. Urine - no protein.  Follow metabolic panel. Continue ARB 

## 2023-06-19 NOTE — Assessment & Plan Note (Signed)
 On jardiance .  A1c just checked 7.5. Discussed -  Low carb diet and exercise.  Follow met b and a1c. Discussed additional medication.  Desires to add medication at this time.  Wants to work on diet and exercise.  Follow met b and A1c.

## 2023-06-19 NOTE — Assessment & Plan Note (Signed)
Last colonoscopy January 2019 (Dr. Elliott).  Per patient recommended follow-up in 3 years.  Overdue follow-up. Have discussed.  Declines.  

## 2023-06-19 NOTE — Assessment & Plan Note (Signed)
 On Crestor .  Low-cholesterol diet and exercise.  Follow lipid panel and liver function tests.   Lab Results  Component Value Date   CHOL 126 06/10/2023   HDL 38.80 (L) 06/10/2023   LDLCALC 55 06/10/2023   LDLDIRECT 71.0 02/19/2022   TRIG 163.0 (H) 06/10/2023   CHOLHDL 3 06/10/2023

## 2023-06-19 NOTE — Assessment & Plan Note (Signed)
 Continue losartan /hctz, coreg  and hydralazine .  Follow pressures.  Follow metabolic panel. Blood pressures as outlined. No changes in medication.

## 2023-06-22 DIAGNOSIS — H6063 Unspecified chronic otitis externa, bilateral: Secondary | ICD-10-CM | POA: Diagnosis not present

## 2023-06-22 DIAGNOSIS — H6123 Impacted cerumen, bilateral: Secondary | ICD-10-CM | POA: Diagnosis not present

## 2023-07-27 LAB — HM DIABETES EYE EXAM

## 2023-08-12 ENCOUNTER — Other Ambulatory Visit: Payer: Self-pay | Admitting: Internal Medicine

## 2023-08-18 ENCOUNTER — Other Ambulatory Visit: Payer: Self-pay | Admitting: Internal Medicine

## 2023-10-21 DIAGNOSIS — H6063 Unspecified chronic otitis externa, bilateral: Secondary | ICD-10-CM | POA: Diagnosis not present

## 2023-10-21 DIAGNOSIS — H6123 Impacted cerumen, bilateral: Secondary | ICD-10-CM | POA: Diagnosis not present

## 2023-11-10 ENCOUNTER — Other Ambulatory Visit: Payer: Self-pay | Admitting: Internal Medicine

## 2023-11-18 ENCOUNTER — Other Ambulatory Visit: Payer: Self-pay | Admitting: Internal Medicine

## 2023-12-07 ENCOUNTER — Other Ambulatory Visit (INDEPENDENT_AMBULATORY_CARE_PROVIDER_SITE_OTHER): Payer: Medicare Other

## 2023-12-07 DIAGNOSIS — E1159 Type 2 diabetes mellitus with other circulatory complications: Secondary | ICD-10-CM

## 2023-12-07 DIAGNOSIS — E7800 Pure hypercholesterolemia, unspecified: Secondary | ICD-10-CM

## 2023-12-07 LAB — BASIC METABOLIC PANEL WITH GFR
BUN: 25 mg/dL — ABNORMAL HIGH (ref 6–23)
CO2: 24 meq/L (ref 19–32)
Calcium: 9.7 mg/dL (ref 8.4–10.5)
Chloride: 102 meq/L (ref 96–112)
Creatinine, Ser: 1.28 mg/dL — ABNORMAL HIGH (ref 0.40–1.20)
GFR: 39.9 mL/min — ABNORMAL LOW (ref >60.00)
Glucose, Bld: 130 mg/dL — ABNORMAL HIGH (ref 70–99)
Potassium: 4 meq/L (ref 3.5–5.1)
Sodium: 139 meq/L (ref 135–145)

## 2023-12-07 LAB — LIPID PANEL
Cholesterol: 142 mg/dL (ref 0–200)
HDL: 40.9 mg/dL (ref >39.00)
LDL Cholesterol: 57 mg/dL (ref 0–99)
NonHDL: 100.71
Total CHOL/HDL Ratio: 3
Triglycerides: 220 mg/dL — ABNORMAL HIGH (ref 0.0–149.0)
VLDL: 44 mg/dL — ABNORMAL HIGH (ref 0.0–40.0)

## 2023-12-07 LAB — HEPATIC FUNCTION PANEL
ALT: 20 U/L (ref 0–35)
AST: 17 U/L (ref 0–37)
Albumin: 4.6 g/dL (ref 3.5–5.2)
Alkaline Phosphatase: 50 U/L (ref 39–117)
Bilirubin, Direct: 0.1 mg/dL (ref 0.0–0.3)
Total Bilirubin: 0.7 mg/dL (ref 0.2–1.2)
Total Protein: 7.2 g/dL (ref 6.0–8.3)

## 2023-12-07 LAB — HEMOGLOBIN A1C: Hgb A1c MFr Bld: 7.4 % — ABNORMAL HIGH (ref 4.6–6.5)

## 2023-12-08 ENCOUNTER — Ambulatory Visit: Payer: Self-pay | Admitting: Internal Medicine

## 2023-12-10 ENCOUNTER — Ambulatory Visit: Payer: Medicare Other | Admitting: Internal Medicine

## 2023-12-10 VITALS — BP 116/68 | HR 64 | Resp 16 | Ht 65.0 in | Wt 193.2 lb

## 2023-12-10 DIAGNOSIS — I25119 Atherosclerotic heart disease of native coronary artery with unspecified angina pectoris: Secondary | ICD-10-CM

## 2023-12-10 DIAGNOSIS — E1159 Type 2 diabetes mellitus with other circulatory complications: Secondary | ICD-10-CM | POA: Diagnosis not present

## 2023-12-10 DIAGNOSIS — M25551 Pain in right hip: Secondary | ICD-10-CM

## 2023-12-10 DIAGNOSIS — I700 Atherosclerosis of aorta: Secondary | ICD-10-CM

## 2023-12-10 DIAGNOSIS — Z0000 Encounter for general adult medical examination without abnormal findings: Secondary | ICD-10-CM

## 2023-12-10 DIAGNOSIS — E7800 Pure hypercholesterolemia, unspecified: Secondary | ICD-10-CM

## 2023-12-10 DIAGNOSIS — I10 Essential (primary) hypertension: Secondary | ICD-10-CM | POA: Diagnosis not present

## 2023-12-10 DIAGNOSIS — J449 Chronic obstructive pulmonary disease, unspecified: Secondary | ICD-10-CM | POA: Diagnosis not present

## 2023-12-10 DIAGNOSIS — I779 Disorder of arteries and arterioles, unspecified: Secondary | ICD-10-CM | POA: Diagnosis not present

## 2023-12-10 DIAGNOSIS — K219 Gastro-esophageal reflux disease without esophagitis: Secondary | ICD-10-CM | POA: Diagnosis not present

## 2023-12-10 DIAGNOSIS — Z7984 Long term (current) use of oral hypoglycemic drugs: Secondary | ICD-10-CM

## 2023-12-10 DIAGNOSIS — Z853 Personal history of malignant neoplasm of breast: Secondary | ICD-10-CM

## 2023-12-10 DIAGNOSIS — I739 Peripheral vascular disease, unspecified: Secondary | ICD-10-CM

## 2023-12-10 DIAGNOSIS — N1831 Chronic kidney disease, stage 3a: Secondary | ICD-10-CM

## 2023-12-10 DIAGNOSIS — F439 Reaction to severe stress, unspecified: Secondary | ICD-10-CM

## 2023-12-10 DIAGNOSIS — G4733 Obstructive sleep apnea (adult) (pediatric): Secondary | ICD-10-CM

## 2023-12-10 DIAGNOSIS — Z1231 Encounter for screening mammogram for malignant neoplasm of breast: Secondary | ICD-10-CM

## 2023-12-10 DIAGNOSIS — E041 Nontoxic single thyroid nodule: Secondary | ICD-10-CM

## 2023-12-10 NOTE — Progress Notes (Signed)
 Subjective:    [name], female    DOB: [birth year], 78 y.o.   MRN: [id number]  Patient here for  Chief Complaint  Patient presents with   Annual Exam    HPI Here for a physical exam. Had f/u with [facility] 09/10/22 - Carotid duplex mild 1 to 39% carotid artery stenosis bilaterally. ABIs were normal at 1.28 on the right and 1.25 on the left with triphasic waveforms. Recommended continuing antiplatelet therapy and f/u in 24 months. Continues cpap. Reports she has been doing relatively well. No chest pain. Breathing stable. No abdominal pain. She did notice - starting approximately one month ago, pain - right groin. Will notice a grabbing sensation when walking - notices intermittently. End of day is worse. No pain extending down the leg. No injury or trauma.    Past Medical History:  Diagnosis Date   Arthritis    Breast cancer (HCC)    Breast cancer, left (HCC) 2016   LT LUMPECTOMY - TI, NO, MO - IDC, ER/PR pos, Her 2 neg, Rad tx's.    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   Fatty liver    GERD (gastroesophageal reflux disease)    Glaucoma    High cholesterol    History of chicken pox    Hyperglycemia    Hyperlipidemia    Hypertension    LVH (left ventricular hypertrophy)    PAC (premature atrial contraction)    Palpitations    Peripheral vascular disease (HCC)    Personal history of radiation therapy 2016   BREAST CA   Pneumonia March 2014   Skin cancer 2013   Sleep apnea    wears CPAP set at 2.5   Wears  glasses    Past Surgical History:  Procedure Laterality Date   BACK SURGERY  1986   ruptured disc   BREAST BIOPSY Left 2008   NEG   BREAST BIOPSY Left 08/20/2014   POS   BREAST BIOPSY Right 2007   NEG   BREAST EXCISIONAL BIOPSY Left 1984   NEG   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 HER2 negative.  Wide excision with SLN biopsy.  MammoSite radiation   CARPAL TUNNEL RELEASE     CATARACT EXTRACTION Right 07/2022   CATARACT EXTRACTION Left 09/2022   CHOLECYSTECTOMY  2006   COLONOSCOPY  2010   Dr. Dellie   COLONOSCOPY WITH PROPOFOL  N/A 05/17/2017   Procedure: COLONOSCOPY WITH PROPOFOL ;  Surgeon: Viktoria Lamar ONEIDA, MD;  Location: [facility] ENDOSCOPY;  Service: Endoscopy;  Laterality: N/A;   DILATION AND CURETTAGE OF UTERUS     ESOPHAGOGASTRODUODENOSCOPY (EGD) WITH PROPOFOL  N/A 05/17/2017   Procedure: ESOPHAGOGASTRODUODENOSCOPY (EGD) WITH PROPOFOL ;  Surgeon: Viktoria Lamar ONEIDA, MD;  Location: [facility] ENDOSCOPY;  Service: Endoscopy;  Laterality: N/A;   LAPAROTOMY FOR REMOVAL TUMOR LUMBAR PLEXES     Leg stent  2011  MOLE REMOVAL  2013   15 removed   POSTERIOR CERVICAL LAMINECTOMY Left 10/15/2015   Procedure: Left Cervical four- five Hemilaminectomy/Remove mass;  Surgeon: Catalina Stains, MD;  Location: [facility] NEURO ORS;  Service: Neurosurgery;  Laterality: Left;  Left C4-5 Hemilaminectomy/Remove mass   Family History  Problem Relation Age of Onset   Cancer Father        Prostate   Diabetes Father    Cerebrovascular Accident Father    Hypertension Mother    AAA (abdominal aortic aneurysm) Mother    Hyperlipidemia Other        Parent   Miscarriages / Stillbirths Other        Parent   Hypertension Other        parent   Heart disease Other        Parent   Breast cancer Maternal Aunt 60   Social History   Socioeconomic History   Marital status: Married    Spouse name: Not on file   Number of children: 3   Years of  education: 12   Highest education level: Not on file  Occupational History   Occupation: Caregiver   Tobacco Use   Smoking status: Never   Smokeless tobacco: Never  Substance and Sexual Activity   Alcohol use: No    Alcohol/week: 0.0 standard drinks of alcohol   Drug use: No   Sexual activity: Never  Other Topics Concern   Not on file  Social History Narrative   Regular exercise-mo   Caffeine Use-no   Social Drivers of Health   Financial Resource Strain: Low Risk  (12/09/2022)   Overall Financial Resource Strain (CARDIA)    Difficulty of Paying Living Expenses: Not hard at all  Food Insecurity: No Food Insecurity (12/09/2022)   Hunger Vital Sign    Worried About Running Out of Food in the Last Year: Never true    Ran Out of Food in the Last Year: Never true  Transportation Needs: No Transportation Needs (12/09/2022)   PRAPARE - Administrator, Civil Service (Medical): No    Lack of Transportation (Non-Medical): No  Physical Activity: Inactive (12/09/2022)   Exercise Vital Sign    Days of Exercise per Week: 0 days    Minutes of Exercise per Session: 0 min  Stress: No Stress Concern Present (12/09/2022)   [employer] of Occupational Health - Occupational Stress Questionnaire    Feeling of Stress : Only a little  Social Connections: Moderately Integrated (12/09/2022)   Social Connection and Isolation Panel    Frequency of Communication with Friends and Family: More than three times a week    Frequency of Social Gatherings with Friends and Family: More than three times a week    Attends Religious Services: More than 4 times per year    Active Member of [employer] or Organizations: Yes    Attends Banker Meetings: More than 4 times per year    Marital Status: Widowed     Review of Systems  Constitutional:  Negative for appetite change and unexpected weight change.  HENT:  Negative for congestion, sinus pressure and sore throat.   Eyes:  Negative for  pain and visual disturbance.  Respiratory:  Negative for cough, chest tightness and shortness of breath.   Cardiovascular:  Negative for chest pain, palpitations and leg swelling.  Gastrointestinal:  Negative for abdominal pain, diarrhea, nausea and vomiting.  Genitourinary:  Negative for difficulty urinating and dysuria.  Musculoskeletal:  Negative for joint  swelling and myalgias.  Skin:  Negative for color change and rash.  Neurological:  Negative for dizziness and headaches.  Hematological:  Negative for adenopathy. Does not bruise/bleed easily.  Psychiatric/Behavioral:  Negative for agitation and dysphoric mood.        Objective:     BP 116/68   Pulse 64   Resp 16   Ht 5' 5 (1.651 m)   Wt 193 lb 3.2 oz (87.6 kg)   SpO2 98%   BMI 32.15 kg/m  Wt Readings from Last 3 Encounters:  12/10/23 193 lb 3.2 oz (87.6 kg)  06/14/23 189 lb 3.2 oz (85.8 kg)  02/09/23 191 lb (86.6 kg)    Physical Exam Vitals reviewed.  Constitutional:      General: She is not in acute distress.    Appearance: Normal appearance. She is well-developed.  HENT:     Head: Normocephalic and atraumatic.     Right Ear: External ear normal.     Left Ear: External ear normal.     Mouth/Throat:     Pharynx: No oropharyngeal exudate or posterior oropharyngeal erythema.  Eyes:     General: No scleral icterus.       Right eye: No discharge.        Left eye: No discharge.     Conjunctiva/sclera: Conjunctivae normal.  Neck:     Thyroid : No thyromegaly.  Cardiovascular:     Rate and Rhythm: Normal rate and regular rhythm.  Pulmonary:     Effort: No tachypnea, accessory muscle usage or respiratory distress.     Breath sounds: Normal breath sounds. No decreased breath sounds, wheezing or rhonchi.  Chest:  Breasts:    Right: No inverted nipple, mass, nipple discharge or tenderness (no axillary adenopathy).     Left: No inverted nipple, mass, nipple discharge or tenderness (no axilarry adenopathy).   Abdominal:     General: Bowel sounds are normal.     Palpations: Abdomen is soft.     Tenderness: There is no abdominal tenderness.  Musculoskeletal:        General: No swelling or tenderness.     Cervical back: Neck supple.     Comments: DP pulses palpable and equal bilaterally.   Lymphadenopathy:     Cervical: No cervical adenopathy.  Skin:    General: Skin is warm.     Findings: No erythema or rash.  Neurological:     Mental Status: She is alert and oriented to person, place, and time.  Psychiatric:        Mood and Affect: Mood normal.        Behavior: Behavior normal.         Outpatient Encounter Medications as of 12/10/2023  Medication Sig   acetaminophen  (TYLENOL ) 650 MG CR tablet Take 650 mg by mouth as needed for pain.   albuterol  (VENTOLIN  HFA) 108 (90 Base) MCG/ACT inhaler Inhale 2 puffs into the lungs every 6 (six) hours as needed for wheezing or shortness of breath.   aspirin  325 MG tablet Take 325 mg by mouth daily.   carvedilol  (COREG ) 12.5 MG tablet TAKE 1 TABLET(12.5 MG) BY MOUTH TWICE DAILY WITH MEALS   Cholecalciferol 25 MCG (1000 UT) tablet Take 1,000 Units by mouth daily.    empagliflozin  (JARDIANCE ) 25 MG TABS tablet Take 1 tablet (25 mg total) by mouth daily before breakfast.   glucose blood (ONETOUCH VERIO) test strip Use to check blood sugars up to three times daily.   hydrALAZINE  (APRESOLINE ) 10 MG  tablet TAKE 1 TABLET(10 MG) BY MOUTH THREE TIMES DAILY   losartan -hydrochlorothiazide  (HYZAAR) 100-25 MG tablet TAKE 1 TABLET BY MOUTH DAILY   meclizine  (ANTIVERT ) 25 MG tablet Take 1 tablet (25 mg total) by mouth daily as needed for dizziness.   omeprazole  (PRILOSEC) 40 MG capsule TAKE 1 CAPSULE(40 MG) BY MOUTH DAILY   OneTouch Delica Lancets 30G MISC Use to check blood sugars up to 3 times daily.   rosuvastatin  (CRESTOR ) 40 MG tablet Take 1 tablet (40 mg total) by mouth daily.   vitamin E 400 UNIT capsule Take 400 Units by mouth daily.   No  facility-administered encounter medications on file as of 12/10/2023.     Lab Results  Component Value Date   WBC 6.1 02/19/2022   HGB 12.8 02/19/2022   HCT 37.7 02/19/2022   PLT 172.0 02/19/2022   GLUCOSE 130 (H) 12/07/2023   CHOL 142 12/07/2023   TRIG 220.0 (H) 12/07/2023   HDL 40.90 12/07/2023   LDLDIRECT 71.0 02/19/2022   LDLCALC 57 12/07/2023   ALT 20 12/07/2023   AST 17 12/07/2023   NA 139 12/07/2023   K 4.0 12/07/2023   CL 102 12/07/2023   CREATININE 1.28 (H) 12/07/2023   BUN 25 (H) 12/07/2023   CO2 24 12/07/2023   TSH 3.85 06/10/2023   INR 0.9 07/13/2012   HGBA1C 7.4 (H) 12/07/2023   MICROALBUR 0.3 12/17/2016    US  SOFT TISSUE HEAD & NECK (NON-THYROID ) Result Date: 10/24/2022 CLINICAL DATA:  Right submandibular gland swelling EXAM: ULTRASOUND OF HEAD/NECK SOFT TISSUES TECHNIQUE: Ultrasound examination of the head and neck soft tissues was performed in the area of clinical concern. COMPARISON:  None available FINDINGS: Targeted sonographic evaluation of the area of concern in the right submandibular region shows no discrete abnormality. IMPRESSION: No discrete sonographic abnormality identified in the region of concern in the right submandibular region. Electronically Signed   By: Aliene Lloyd M.D.   On: 10/24/2022 08:14       Assessment & Plan:  Routine general medical examination at a health care facility  Encounter for screening mammogram for malignant neoplasm of breast -     3D Screening Mammogram, Left and Right; Future  Health care maintenance Assessment & Plan: Physical today 12/10/23.  Colonoscopy 05/2017 - recommended f/u in 3 years.  Overdue. Will notify me when agreeable.  Mammogram 06/23/22 - Birads I. Overdue mammogram.    Right hip pain Assessment & Plan: Right groin pain as outlined. Increased pain with palpation. Also discomfort with abduction - lower extremity. DP pulse palpable and equal bilaterally. Appears to be more msk in origin. Check xray.  Further w/up pending results.   Orders: -     DG HIP UNILAT W OR W/O PELVIS 2-3 VIEWS RIGHT; Future  Aortic atherosclerosis (HCC) Assessment & Plan: Continue crestor .    Coronary artery disease involving native coronary artery of native heart with angina pectoris [facility]) Assessment & Plan: No chest pain. Continue risk factor modification.    Bilateral carotid artery disease, unspecified type [facility]) Assessment & Plan: Evaluated [facility] 09/2022 1-39% carotid ultrasound.  Recommended f/u in 2 years.     Stage 3a chronic kidney disease (HCC) Assessment & Plan: Avoid anti-inflammatories..  Stay hydrated. Urine - no protein.  Follow metabolic panel. Continue ARB.    Chronic obstructive pulmonary disease, unspecified COPD type (HCC) Assessment & Plan: Breathing stable. No sob reported.    Diabetes mellitus with cardiac complication [facility]) Assessment & Plan: Continue jardiance . A1c 7.4. did  not tolerate metformin . Discussed diet and exercise. Discussed treatment options. Declines GLP 1 agonist. Wants to work on diet and exercise. Declines additional medication at this time. Follow met b and A1c.    Gastroesophageal reflux disease without esophagitis Assessment & Plan: No upper symptoms reported. Continue prilosec.    History of breast cancer Assessment & Plan: Completed femara .  Mammogram 06/23/22 - Birads I. Schedule f/u mammogram. Overdue mammogram. Ordered.    Hypercholesterolemia Assessment & Plan: On Crestor .  Low-cholesterol diet and exercise.  Follow lipid panel and liver function tests.   Lab Results  Component Value Date   CHOL 142 12/07/2023   HDL 40.90 12/07/2023   LDLCALC 57 12/07/2023   LDLDIRECT 71.0 02/19/2022   TRIG 220.0 (H) 12/07/2023   CHOLHDL 3 12/07/2023      Primary hypertension Assessment & Plan: Continue losartan /hctz, coreg  and hydralazine .  Follow pressures.  Follow metabolic panel. Blood pressures as outlined. No changes in medication today.     OSA on CPAP Assessment & Plan: Continue cpap.    Peripheral vascular disease (HCC) Assessment & Plan: Had f/u with [facility] 09/10/22 - Carotid duplex mild 1 to 39% carotid artery stenosis bilaterally. ABIs were normal at 1.28 on the right and 1.25 on the left with triphasic waveforms. Recommended continuing antiplatelet therapy and f/u in 24 months.    Stress Assessment & Plan: Increased stress.  She feels she is handling things relatively well. Will notify me if she feels she needs any further intervention. Follow.    Thyroid  nodule Assessment & Plan: Evaluated by Dr Damian.  Biopsy negative.  Follow tsh.        Allena Hamilton, MD

## 2023-12-10 NOTE — Assessment & Plan Note (Signed)
 Physical today 12/10/23.  Colonoscopy 05/2017 - recommended f/u in 3 years.  Overdue. Will notify me when agreeable.  Mammogram 06/23/22 - Birads I. Overdue mammogram.

## 2023-12-12 ENCOUNTER — Encounter: Payer: Self-pay | Admitting: Internal Medicine

## 2023-12-12 NOTE — Assessment & Plan Note (Addendum)
 Completed femara .  Mammogram 06/23/22 - Birads I. Schedule f/u mammogram. Overdue mammogram. Ordered.

## 2023-12-12 NOTE — Assessment & Plan Note (Signed)
 Increased stress.  She feels she is handling things relatively well. Will notify me if she feels she needs any further intervention. Follow.

## 2023-12-12 NOTE — Assessment & Plan Note (Signed)
Evaluated [facility] 09/2022 1-39% carotid ultrasound.  Recommended f/u in 2 years.

## 2023-12-12 NOTE — Assessment & Plan Note (Signed)
No upper symptoms reported.  Continue prilosec.  

## 2023-12-12 NOTE — Assessment & Plan Note (Signed)
 Had f/u with [facility] 09/10/22 - Carotid duplex mild 1 to 39% carotid artery stenosis bilaterally. ABIs were normal at 1.28 on the right and 1.25 on the left with triphasic waveforms. Recommended continuing antiplatelet therapy and f/u in 24 months.

## 2023-12-12 NOTE — Assessment & Plan Note (Signed)
 Right groin pain as outlined. Increased pain with palpation. Also discomfort with abduction - lower extremity. DP pulse palpable and equal bilaterally. Appears to be more msk in origin. Check xray. Further w/up pending results.

## 2023-12-12 NOTE — Assessment & Plan Note (Signed)
 On Crestor .  Low-cholesterol diet and exercise.  Follow lipid panel and liver function tests.   Lab Results  Component Value Date   CHOL 142 12/07/2023   HDL 40.90 12/07/2023   LDLCALC 57 12/07/2023   LDLDIRECT 71.0 02/19/2022   TRIG 220.0 (H) 12/07/2023   CHOLHDL 3 12/07/2023

## 2023-12-12 NOTE — Assessment & Plan Note (Signed)
Breathing stable.  No sob reported.  

## 2023-12-12 NOTE — Assessment & Plan Note (Signed)
 No chest pain.  Continue risk factor modification.

## 2023-12-12 NOTE — Assessment & Plan Note (Signed)
 Continue cpap.

## 2023-12-12 NOTE — Assessment & Plan Note (Signed)
 Continue losartan /hctz, coreg  and hydralazine .  Follow pressures.  Follow metabolic panel. Blood pressures as outlined. No changes in medication today.

## 2023-12-12 NOTE — Assessment & Plan Note (Signed)
 Continue jardiance . A1c 7.4. did not tolerate metformin . Discussed diet and exercise. Discussed treatment options. Declines GLP 1 agonist. Wants to work on diet and exercise. Declines additional medication at this time. Follow met b and A1c.

## 2023-12-12 NOTE — Assessment & Plan Note (Signed)
 Continue crestor

## 2023-12-12 NOTE — Assessment & Plan Note (Signed)
Evaluated by Dr Solum.  Biopsy negative.  Follow tsh.   

## 2023-12-12 NOTE — Assessment & Plan Note (Signed)
Avoid anti-inflammatories..  Stay hydrated. Urine - no protein.  Follow metabolic panel. Continue ARB 

## 2023-12-14 ENCOUNTER — Ambulatory Visit
Admission: RE | Admit: 2023-12-14 | Discharge: 2023-12-14 | Disposition: A | Discharge status: Home / Self Care | Attending: Internal Medicine | Admitting: Internal Medicine

## 2023-12-14 ENCOUNTER — Ambulatory Visit
Admission: RE | Admit: 2023-12-14 | Discharge: 2023-12-14 | Disposition: A | Discharge status: Home / Self Care | Source: Ambulatory Visit | Attending: Internal Medicine | Admitting: Internal Medicine

## 2023-12-14 ENCOUNTER — Ambulatory Visit (INDEPENDENT_AMBULATORY_CARE_PROVIDER_SITE_OTHER): Payer: Medicare Other | Admitting: *Deleted

## 2023-12-14 VITALS — BP 118/68 | Ht 65.0 in | Wt 190.0 lb

## 2023-12-14 DIAGNOSIS — M47816 Spondylosis without myelopathy or radiculopathy, lumbar region: Secondary | ICD-10-CM | POA: Diagnosis not present

## 2023-12-14 DIAGNOSIS — Z0000 Encounter for general adult medical examination without abnormal findings: Secondary | ICD-10-CM

## 2023-12-14 DIAGNOSIS — M25551 Pain in right hip: Secondary | ICD-10-CM

## 2023-12-14 NOTE — Progress Notes (Signed)
 Subjective:   Lindsey Bentley is a 78 y.o. who presents for a Medicare Wellness preventive visit.  As a reminder, Annual Wellness Visits don't include a physical exam, and some assessments may be limited, especially if this visit is performed virtually. We may recommend an in-person follow-up visit with your provider if needed.  Visit Complete: Virtual I connected with  Lindsey Bentley on 12/14/23 by a audio enabled telemedicine application and verified that I am speaking with the correct person using two identifiers.  Patient Location: Home  Provider Location: Home Office  I discussed the limitations of evaluation and management by telemedicine. The patient expressed understanding and agreed to proceed.  Vital Signs: Because this visit was a virtual/telehealth visit, some criteria may be missing or patient reported. Any vitals not documented were not able to be obtained and vitals that have been documented are patient reported.  VideoDeclined- This patient declined Librarian, academic. Therefore the visit was completed with audio only.  Persons Participating in Visit: Patient.  AWV Questionnaire: No: Patient Medicare AWV questionnaire was not completed prior to this visit.  Cardiac Risk Factors include: advanced age (>44men, >51 women);diabetes mellitus;dyslipidemia;hypertension;obesity (BMI >30kg/m2)     Objective:    Today's Vitals   12/14/23 1053  BP: 118/68  Weight: 190 lb (86.2 kg)  Height: 5' 5 (1.651 m)   Body mass index is 31.62 kg/m.     12/14/2023   11:04 AM 12/09/2022    2:53 PM 11/24/2021   11:09 AM 11/20/2020    1:30 PM 12/14/2019   10:48 AM 11/20/2019    1:50 PM 11/16/2018    1:18 PM  Advanced Directives  Does Patient Have a Medical Advance Directive? No No No No No No No  Would patient like information on creating a medical advance directive? No - Patient declined No - Patient declined No - Patient declined No - Patient declined No -  Patient declined No - Patient declined No - Patient declined      Data saved with a previous flowsheet row definition    Current Medications (verified) Outpatient Encounter Medications as of 12/14/2023  Medication Sig   acetaminophen  (TYLENOL ) 650 MG CR tablet Take 650 mg by mouth as needed for pain.   albuterol  (VENTOLIN  HFA) 108 (90 Base) MCG/ACT inhaler Inhale 2 puffs into the lungs every 6 (six) hours as needed for wheezing or shortness of breath.   aspirin  325 MG tablet Take 325 mg by mouth daily.   carvedilol  (COREG ) 12.5 MG tablet TAKE 1 TABLET(12.5 MG) BY MOUTH TWICE DAILY WITH MEALS   Cholecalciferol 25 MCG (1000 UT) tablet Take 1,000 Units by mouth daily.    empagliflozin  (JARDIANCE ) 25 MG TABS tablet Take 1 tablet (25 mg total) by mouth daily before breakfast.   glucose blood (ONETOUCH VERIO) test strip Use to check blood sugars up to three times daily.   hydrALAZINE  (APRESOLINE ) 10 MG tablet TAKE 1 TABLET(10 MG) BY MOUTH THREE TIMES DAILY   losartan -hydrochlorothiazide  (HYZAAR) 100-25 MG tablet TAKE 1 TABLET BY MOUTH DAILY   meclizine  (ANTIVERT ) 25 MG tablet Take 1 tablet (25 mg total) by mouth daily as needed for dizziness.   omeprazole  (PRILOSEC) 40 MG capsule TAKE 1 CAPSULE(40 MG) BY MOUTH DAILY   OneTouch Delica Lancets 30G MISC Use to check blood sugars up to 3 times daily.   rosuvastatin  (CRESTOR ) 40 MG tablet Take 1 tablet (40 mg total) by mouth daily.   vitamin E 400 UNIT capsule  Take 400 Units by mouth daily.   No facility-administered encounter medications on file as of 12/14/2023.    Allergies (verified) Codeine, Prednisone, and Tape   History: Past Medical History:  Diagnosis Date   Arthritis    Breast cancer (HCC)    Breast cancer, left (HCC) 2016   LT LUMPECTOMY - TI, NO, MO - IDC, ER/PR pos, Her 2 neg, Rad tx's.    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   Fatty liver    GERD (gastroesophageal reflux disease)    Glaucoma    High cholesterol    History of chicken pox    Hyperglycemia    Hyperlipidemia    Hypertension    LVH (left ventricular hypertrophy)    PAC (premature atrial contraction)    Palpitations    Peripheral vascular disease (HCC)    Personal history of radiation therapy 2016   BREAST CA   Pneumonia March 2014   Skin cancer 2013   Sleep apnea    wears CPAP set at 2.5   Wears glasses    Past Surgical History:  Procedure Laterality Date   BACK SURGERY  1986   ruptured disc   BREAST BIOPSY Left 2008   NEG   BREAST BIOPSY Left 08/20/2014   POS   BREAST BIOPSY Right 2007   NEG   BREAST EXCISIONAL BIOPSY Left 1984   NEG   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 HER2 negative.  Wide excision with SLN biopsy.  MammoSite radiation   CARPAL TUNNEL RELEASE     CATARACT EXTRACTION Right 07/2022   CATARACT EXTRACTION Left 09/2022   CHOLECYSTECTOMY  2006   COLONOSCOPY  2010   Dr. Dellie   COLONOSCOPY WITH PROPOFOL  N/A 05/17/2017   Procedure: COLONOSCOPY WITH PROPOFOL ;  Surgeon: Viktoria Lamar DASEN, MD;  Location: [facility] ENDOSCOPY;  Service: Endoscopy;  Laterality: N/A;   DILATION AND CURETTAGE OF UTERUS     ESOPHAGOGASTRODUODENOSCOPY (EGD) WITH PROPOFOL  N/A 05/17/2017   Procedure: ESOPHAGOGASTRODUODENOSCOPY (EGD) WITH PROPOFOL ;  Surgeon: Viktoria Lamar DASEN, MD;  Location[facility] END[facility]COPY;  Service: Endoscopy;  Laterality: N/A;   LAPAROTOMY FOR REMOVAL TUMOR LUMBAR PLEXES     Leg stent  2011   MOLE  REMOVAL  2013   15 removed   POSTERIOR CERVICAL LAMINECTOMY Left 10/15/2015   Procedure: Left Cervical four- five Hemilaminectomy/Remove mass;  Surgeon: Catalina Stains, MD;  Location: [facility] NEURO ORS;  Service: Neurosurgery;  Laterality: Left;  Left C4-5 Hemilaminectomy/Remove mass   Family History  Problem Relation Age of Onset   Cancer Father        Prostate   Diabetes Father    Cerebrovascular Accident Father    Hypertension Mother    AAA (abdominal aortic aneurysm) Mother    Hyperlipidemia Other        Parent   Miscarriages / [place] Other  Parent   Hypertension Other        parent   Heart disease Other        Parent   Breast cancer Maternal Aunt 31   Social History   Socioeconomic History   Marital status: Married    Spouse name: Not on file   Number of children: 3   Years of education: 12   Highest education level: Not on file  Occupational History   Occupation: Caregiver   Tobacco Use   Smoking status: Never   Smokeless tobacco: Never  Substance and Sexual Activity   Alcohol use: No    Alcohol/week: 0.0 standard drinks of alcohol   Drug use: No   Sexual activity: Never  Other Topics Concern   Not on file  Social History Narrative   Regular exercise-mo   Caffeine Use-no   Social Drivers of Health   Financial Resource Strain: Low Risk  (12/14/2023)   Overall Financial Resource Strain (CARDIA)    Difficulty of Paying Living Expenses: Not hard at all  Food Insecurity: No Food Insecurity (12/14/2023)   Hunger Vital Sign    Worried About Running Out of Food in the Last Year: Never true    Ran Out of Food in the Last Year: Never true  Transportation Needs: No Transportation Needs (12/14/2023)   PRAPARE - Administrator, Civil Service (Medical): No    Lack of Transportation (Non-Medical): No  Physical Activity: Inactive (12/14/2023)   Exercise Vital Sign    Days of Exercise per Week: 0 days    Minutes of Exercise per Session: 0 min  Stress: No  Stress Concern Present (12/14/2023)   [employer] of Occupational Health - Occupational Stress Questionnaire    Feeling of Stress: Not at all  Social Connections: Moderately Integrated (12/14/2023)   Social Connection and Isolation Panel    Frequency of Communication with Friends and Family: More than three times a week    Frequency of Social Gatherings with Friends and Family: More than three times a week    Attends Religious Services: More than 4 times per year    Active Member of [employer] or Organizations: Yes    Attends Banker Meetings: More than 4 times per year    Marital Status: Widowed    Tobacco Counseling Counseling given: Not Answered    Clinical Intake:  Pre-visit preparation completed: Yes  Pain : No/denies pain     BMI - recorded: 31.62 Nutritional Status: BMI > 30  Obese Nutritional Risks: None Diabetes: Yes CBG done?: Yes (BS per patient 106 after a meal) CBG resulted in Enter/ Edit results?: No Did pt. bring in CBG monitor from home?: No  Lab Results  Component Value Date   HGBA1C 7.4 (H) 12/07/2023   HGBA1C 7.5 (H) 06/10/2023   HGBA1C 7.1 (H) 02/04/2023     How often do you need to have someone help you when you read instructions, pamphlets, or other written materials from your doctor or pharmacy?: 1 - Never  Interpreter Needed?: No  Information entered by :: R. Vika Buske LPN   Activities of Daily Living     12/14/2023   10:55 AM  In your present state of health, do you have any difficulty performing the following activities:  Hearing? 0  Vision? 0  Difficulty concentrating or making decisions? 0  Walking or climbing stairs? 0  Dressing or bathing? 0  Doing errands, shopping? 0  Preparing Food and eating ? N  Using  the Toilet? N  In the past six months, have you accidently leaked urine? N  Do you have problems with loss of bowel control? N  Managing your Medications? N  Managing your Finances? N  Housekeeping or managing your  Housekeeping? N    Patient Care Team: Glendia Shad, MD as PCP - General (Internal Medicine) Dellie Louanne MATSU, MD (General Surgery) Glendia Shad, MD (Internal Medicine) Florencio Cara BIRCH, MD as Consulting Physician (Cardiology)  I have updated your Care Teams any recent Medical Services you may have received from other providers in the past year.     Assessment:   This is a routine wellness examination for Kimia.  Hearing/Vision screen Hearing Screening - Comments:: No issues Vision Screening - Comments:: glasses   Goals Addressed             This Visit's Progress    Patient Stated       Wants to stay active       Depression Screen     12/14/2023   11:01 AM 12/10/2023    3:54 PM 12/09/2022    2:47 PM 06/03/2022    9:01 AM 02/24/2022    1:24 PM 12/19/2021    2:37 PM 11/20/2020    1:27 PM  PHQ 2/9 Scores  PHQ - 2 Score 0 0 0 0 0 1 0  PHQ- 9 Score 0  0        Fall Risk     12/14/2023   10:57 AM 12/09/2022    2:43 PM 06/03/2022    9:01 AM 02/24/2022    1:24 PM 12/19/2021    2:37 PM  Fall Risk   Falls in the past year? 0 0 0 1 0  Number falls in past yr: 0 0 0 0 0  Injury with Fall? 0 0 0 0 0  Risk for fall due to : No Fall Risks No Fall Risks No Fall Risks No Fall Risks No Fall Risks  Follow up Falls evaluation completed;Falls prevention discussed Falls prevention discussed;Falls evaluation completed Falls evaluation completed  Falls evaluation completed  Falls evaluation completed      Data saved with a previous flowsheet row definition    MEDICARE RISK AT HOME:  Medicare Risk at Home Any stairs in or around the home?: Yes If so, are there any without handrails?: No Home free of loose throw rugs in walkways, pet beds, electrical cords, etc?: Yes Adequate lighting in your home to reduce risk of falls?: Yes Life alert?: No Use of a cane, walker or w/c?: No Grab bars in the bathroom?: Yes Shower chair or bench in shower?: Yes Elevated toilet seat  or a handicapped toilet?: Yes  TIMED UP AND GO:  Was the test performed?  No  Cognitive Function: 6CIT completed    02/12/2016   11:37 AM  MMSE - Mini Mental State Exam  Orientation to time 5   Orientation to Place 5   Registration 3   Attention/ Calculation 5   Recall 2   Language- name 2 objects 2   Language- repeat 1  Language- follow 3 step command 3   Language- read & follow direction 1   Write a sentence 1   Copy design 1   Total score 29      Data saved with a previous flowsheet row definition        12/14/2023   11:04 AM 12/09/2022    2:54 PM 11/20/2020    1:49 PM 11/20/2019  1:50 PM 11/16/2018    1:51 PM  6CIT Screen  What Year? 0 points 0 points 0 points 0 points 0 points  What month? 0 points 0 points 0 points 0 points 0 points  What time? 0 points 0 points 0 points  0 points  Count back from 20 0 points 0 points 0 points  0 points  Months in reverse 0 points 0 points 0 points 0 points 0 points  Repeat phrase 2 points 2 points  2 points 0 points  Total Score 2 points 2 points   0 points    Immunizations Immunization History  Administered Date(s) Administered   Pneumococcal Conjugate-13 12/17/2016   Pneumococcal Polysaccharide-23 07/26/2012   Td 05/11/2004    Screening Tests Health Maintenance  Topic Date Due   Zoster Vaccines- Shingrix (1 of 2) Never done   Diabetic kidney evaluation - Urine ACR  12/17/2017   OPHTHALMOLOGY EXAM  09/07/2021   MAMMOGRAM  06/24/2023   FOOT EXAM  10/06/2023   Medicare Annual Wellness (AWV)  12/09/2023   INFLUENZA VACCINE  12/10/2023   HEMOGLOBIN A1C  06/08/2024   Diabetic kidney evaluation - eGFR measurement  12/06/2024   Pneumococcal Vaccine: 50+ Years  Completed   DEXA SCAN  Completed   Hepatitis C Screening  Completed   Hepatitis B Vaccines  Aged Out   HPV VACCINES  Aged Out   Meningococcal B Vaccine  Aged Out   DTaP/Tdap/Td  Discontinued   Colonoscopy  Discontinued   COVID-19 Vaccine  Discontinued     Health Maintenance  Health Maintenance Due  Topic Date Due   Zoster Vaccines- Shingrix (1 of 2) Never done   Diabetic kidney evaluation - Urine ACR  12/17/2017   OPHTHALMOLOGY EXAM  09/07/2021   MAMMOGRAM  06/24/2023   FOOT EXAM  10/06/2023   Medicare Annual Wellness (AWV)  12/09/2023   INFLUENZA VACCINE  12/10/2023   Health Maintenance Items Addressed: Patient declines vaccines. Patient reminded to call and schedule mammogram order was placed 12/10/23 phone number provided.  Patient needs a diabetic foot exam completed and documented at next visit.  Additional Screening:  Vision Screening: Recommended annual ophthalmology exams for early detection of glaucoma and other disorders of the eye.  [facility]   Patient is not sure if she has had a diabetic eye exam and will call and find out and if not will schedule it. If she has had one she will have them fax the office notes to the office. Would you like a referral to an eye doctor? No    Dental Screening: Recommended annual dental exams for proper oral hygiene  Community Resource Referral / Chronic Care Management: CRR required this visit?  No   CCM required this visit?  No   Plan:    I have personally reviewed and noted the following in the patient's chart:   Medical and social history Use of alcohol, tobacco or illicit drugs  Current medications and supplements including opioid prescriptions. Patient is not currently taking opioid prescriptions. Functional ability and status Nutritional status Physical activity Advanced directives List of other physicians Hospitalizations, surgeries, and ER visits in previous 12 months Vitals Screenings to include cognitive, depression, and falls Referrals and appointments  In addition, I have reviewed and discussed with patient certain preventive protocols, quality metrics, and best practice recommendations. A written personalized care plan for preventive services as well as  general preventive health recommendations were provided to patient.   Angeline Fredericks, LPN  12/14/2023   After Visit Summary: (Declined) Due to this being a telephonic visit, with patients personalized plan was offered to patient but patient Declined AVS at this time   Notes: Nothing significant to report at this time.

## 2023-12-14 NOTE — Patient Instructions (Signed)
 Lindsey Bentley , Thank you for taking time out of your busy schedule to complete your Annual Wellness Visit with me. I enjoyed our conversation and look forward to speaking with you again next year. I, as well as your care team,  appreciate your ongoing commitment to your health goals. Please review the following plan we discussed and let me know if I can assist you in the future. Your Game plan/ To Do List    Referrals: If you haven't heard from the office you've been referred to, please reach out to them at the phone provided.  Remember to call and schedule your mammogram that was ordered 12/10/23.  Please check and see if you have had a diabetic eye exam and if not schedule it.  Follow up Visits: We will see or speak with you next year for your Next Medicare AWV with our clinical staff 12/18/24 @ 10:50 Have you seen your provider in the last 6 months (3 months if uncontrolled diabetes)? Yes  Clinician Recommendations:  Aim for 30 minutes of exercise or brisk walking, 6-8 glasses of water, and 5 servings of fruits and vegetables each day.       This is a list of the screenings recommended for you:  Health Maintenance  Topic Date Due   Zoster (Shingles) Vaccine (1 of 2) Never done   Yearly kidney health urinalysis for diabetes  12/17/2017   Eye exam for diabetics  09/07/2021   Mammogram  06/24/2023   Complete foot exam   10/06/2023   Flu Shot  12/10/2023   Hemoglobin A1C  06/08/2024   Yearly kidney function blood test for diabetes  12/06/2024   Medicare Annual Wellness Visit  12/13/2024   Pneumococcal Vaccine for age over 70  Completed   DEXA scan (bone density measurement)  Completed   Hepatitis C Screening  Completed   Hepatitis B Vaccine  Aged Out   HPV Vaccine  Aged Out   Meningitis B Vaccine  Aged Out   DTaP/Tdap/Td vaccine  Discontinued   Colon Cancer Screening  Discontinued   COVID-19 Vaccine  Discontinued    Advanced directives:  Advance Care Planning is important because  it:  [x]  Makes sure you receive the medical care that is consistent with your values, goals, and preferences  [x]  It provides guidance to your family and loved ones and reduces their decisional burden about whether or not they are making the right decisions based on your wishes.

## 2023-12-15 ENCOUNTER — Ambulatory Visit: Payer: Self-pay | Admitting: Internal Medicine

## 2023-12-28 ENCOUNTER — Other Ambulatory Visit: Payer: Self-pay | Admitting: Internal Medicine

## 2023-12-28 DIAGNOSIS — E1159 Type 2 diabetes mellitus with other circulatory complications: Secondary | ICD-10-CM

## 2023-12-29 ENCOUNTER — Other Ambulatory Visit: Payer: Self-pay | Admitting: Internal Medicine

## 2023-12-30 ENCOUNTER — Encounter

## 2024-01-19 ENCOUNTER — Telehealth: Payer: Self-pay | Admitting: Internal Medicine

## 2024-01-19 NOTE — Telephone Encounter (Signed)
 Order for CPAP supplies and recent office note placed out for signature. Called patient and she is aware. Request was not for a sleep study, she just needs her supplies.

## 2024-01-19 NOTE — Telephone Encounter (Signed)
 Signed order. Office note was attached. Did not see sleep study.

## 2024-01-19 NOTE — Telephone Encounter (Signed)
 Copied from CRM [id number]. Topic: General - Call Back - No Documentation >> Jan 19, 2024 12:01 PM Leah C wrote: Reason for CRM: Patient would like a call back from Zebedee to her cell  [phone] (H). Patient had [facility] fax over documents in regards to sleep study. Advised patient that we did receive fax, it is chart. Patient would like for Zebedee to give a call.

## 2024-01-20 NOTE — Telephone Encounter (Unsigned)
 Copied from CRM [id number]. Topic: General - Call Back - No Documentation >> Jan 20, 2024 12:15 PM Rosina BIRCH wrote: Lesley from [facility] called wanting to see if the office received a fax for cpap supplies  CB [phone] Fax-[phone]

## 2024-01-20 NOTE — Telephone Encounter (Signed)
 Order and note faxed, Patient has not had recent sleep study.

## 2024-01-20 NOTE — Telephone Encounter (Signed)
 Order and note refaxed

## 2024-01-25 ENCOUNTER — Other Ambulatory Visit: Payer: Self-pay | Admitting: Internal Medicine

## 2024-02-09 ENCOUNTER — Other Ambulatory Visit: Payer: Self-pay | Admitting: Internal Medicine

## 2024-02-10 DIAGNOSIS — H6063 Unspecified chronic otitis externa, bilateral: Secondary | ICD-10-CM | POA: Diagnosis not present

## 2024-02-10 DIAGNOSIS — H6123 Impacted cerumen, bilateral: Secondary | ICD-10-CM | POA: Diagnosis not present

## 2024-02-14 ENCOUNTER — Other Ambulatory Visit: Payer: Self-pay | Admitting: Internal Medicine

## 2024-02-18 ENCOUNTER — Telehealth: Payer: Self-pay

## 2024-02-18 DIAGNOSIS — X58XXXA Exposure to other specified factors, initial encounter: Secondary | ICD-10-CM | POA: Diagnosis not present

## 2024-02-18 DIAGNOSIS — M109 Gout, unspecified: Secondary | ICD-10-CM | POA: Diagnosis not present

## 2024-02-18 DIAGNOSIS — M545 Low back pain, unspecified: Secondary | ICD-10-CM | POA: Diagnosis not present

## 2024-02-18 DIAGNOSIS — S29019A Strain of muscle and tendon of unspecified wall of thorax, initial encounter: Secondary | ICD-10-CM | POA: Diagnosis not present

## 2024-02-18 NOTE — Telephone Encounter (Signed)
 Spoke with pt and she stated that she was vacumming and thinks she pulled a muscle and that's what is causing her the hip pain. She stated that she has been doctoring that at home and it seems to be getting better. However now she thinks she may have a gout flare in her right big toe. She stated that it is red, warm and painful. Pt was advised that she would need to be evaluated at an [facility] care. Pt gave a verbal understanding.

## 2024-02-18 NOTE — Telephone Encounter (Signed)
 Copied from CRM [id number]. Topic: Clinical - Refused Triage >> Feb 18, 2024 11:09 AM Alfonso HERO wrote: Red Word that prompted transfer to Nurse Triage: really bad pain in the hip and big toe think it may be gout

## 2024-02-19 NOTE — Telephone Encounter (Signed)
 Pt seen at [facility]. Given colchicine.

## 2024-02-21 ENCOUNTER — Other Ambulatory Visit: Payer: Self-pay

## 2024-02-21 MED ORDER — OMEPRAZOLE 40 MG PO CPDR: DELAYED_RELEASE_CAPSULE | ORAL | 0 refills | Status: DC

## 2024-02-28 NOTE — Progress Notes (Signed)
 Lindsey Bentley                                          MRN: [id number]   02/28/2024   The [facility] Quality Team Specialist reviewed this patient medical record for the purposes of chart review for care gap closure. The following were reviewed: chart review for care gap closure-kidney health evaluation for diabetes:eGFR  and uACR.    [facility]BCI Quality Team

## 2024-03-06 ENCOUNTER — Telehealth: Payer: Self-pay

## 2024-03-06 NOTE — Telephone Encounter (Signed)
 Copied from CRM [id number]. Topic: Clinical - Order For Equipment >> Mar 06, 2024 10:40 AM Thersia BROCKS wrote: Reason for CRM: Patient called in stated she would like for nurse to give her a call regarding cpap machine order

## 2024-03-08 NOTE — Telephone Encounter (Signed)
 See me before calling Please call and see where she has been getting her supplies. Also, need to confirm when she had her last sleep study. If more than 5 years, will need to be reevaluated and I recommend a referral to pulmonary (Dr Talbot) for evaluation.

## 2024-03-10 ENCOUNTER — Other Ambulatory Visit: Payer: Self-pay | Admitting: *Deleted

## 2024-03-10 DIAGNOSIS — E1159 Type 2 diabetes mellitus with other circulatory complications: Secondary | ICD-10-CM

## 2024-03-10 DIAGNOSIS — E7800 Pure hypercholesterolemia, unspecified: Secondary | ICD-10-CM

## 2024-03-13 NOTE — Telephone Encounter (Signed)
 Patient would like the office to call her back about her supplies.

## 2024-03-15 ENCOUNTER — Other Ambulatory Visit

## 2024-03-16 ENCOUNTER — Other Ambulatory Visit

## 2024-03-16 DIAGNOSIS — E1159 Type 2 diabetes mellitus with other circulatory complications: Secondary | ICD-10-CM

## 2024-03-16 DIAGNOSIS — E7800 Pure hypercholesterolemia, unspecified: Secondary | ICD-10-CM

## 2024-03-16 LAB — BASIC METABOLIC PANEL WITH GFR
BUN: 30 mg/dL — ABNORMAL HIGH (ref 6–23)
CO2: 26 meq/L (ref 19–32)
Calcium: 9.3 mg/dL (ref 8.4–10.5)
Chloride: 104 meq/L (ref 96–112)
Creatinine, Ser: 1.54 mg/dL — ABNORMAL HIGH (ref 0.40–1.20)
GFR: 31.9 mL/min — ABNORMAL LOW (ref >60.00)
Glucose, Bld: 123 mg/dL — ABNORMAL HIGH (ref 70–99)
Potassium: 4 meq/L (ref 3.5–5.1)
Sodium: 140 meq/L (ref 135–145)

## 2024-03-16 LAB — HEPATIC FUNCTION PANEL
ALT: 17 U/L (ref 0–35)
AST: 15 U/L (ref 0–37)
Albumin: 4.2 g/dL (ref 3.5–5.2)
Alkaline Phosphatase: 59 U/L (ref 39–117)
Bilirubin, Direct: 0.1 mg/dL (ref 0.0–0.3)
Total Bilirubin: 0.5 mg/dL (ref 0.2–1.2)
Total Protein: 7.2 g/dL (ref 6.0–8.3)

## 2024-03-16 LAB — LIPID PANEL
Cholesterol: 127 mg/dL (ref 0–200)
HDL: 38.6 mg/dL — ABNORMAL LOW (ref >39.00)
LDL Cholesterol: 34 mg/dL (ref 0–99)
NonHDL: 87.95
Total CHOL/HDL Ratio: 3
Triglycerides: 272 mg/dL — ABNORMAL HIGH (ref 0.0–149.0)
VLDL: 54.4 mg/dL — ABNORMAL HIGH (ref 0.0–40.0)

## 2024-03-16 LAB — HEMOGLOBIN A1C: Hgb A1c MFr Bld: 7.7 % — ABNORMAL HIGH (ref 4.6–6.5)

## 2024-03-16 NOTE — Telephone Encounter (Signed)
 Spoke to pt. pt stated that she is needing a new cpap machine from [facility] where she previous;ly got them from, she also stated that they are not needing a new sleep study done because they have been monitoring her and she has been doing so good with it, she just needs new order for CPAP machine and order needs to be signed and sent to this correct fax number  [facility] # [phone] also  prescription has to included the following: ResMed air sense 10 auto set, Pressure count 4.

## 2024-03-17 ENCOUNTER — Ambulatory Visit: Payer: Self-pay | Admitting: Internal Medicine

## 2024-03-17 ENCOUNTER — Ambulatory Visit: Admitting: Internal Medicine

## 2024-03-17 ENCOUNTER — Encounter: Payer: Self-pay | Admitting: Internal Medicine

## 2024-03-17 VITALS — BP 122/62 | HR 56 | Temp 97.8°F | Ht 65.0 in | Wt 188.5 lb

## 2024-03-17 DIAGNOSIS — Z853 Personal history of malignant neoplasm of breast: Secondary | ICD-10-CM

## 2024-03-17 DIAGNOSIS — Z860100 Personal history of colon polyps, unspecified: Secondary | ICD-10-CM

## 2024-03-17 DIAGNOSIS — I10 Essential (primary) hypertension: Secondary | ICD-10-CM

## 2024-03-17 DIAGNOSIS — E1159 Type 2 diabetes mellitus with other circulatory complications: Secondary | ICD-10-CM

## 2024-03-17 DIAGNOSIS — Z1231 Encounter for screening mammogram for malignant neoplasm of breast: Secondary | ICD-10-CM

## 2024-03-17 DIAGNOSIS — M109 Gout, unspecified: Secondary | ICD-10-CM

## 2024-03-17 DIAGNOSIS — G4733 Obstructive sleep apnea (adult) (pediatric): Secondary | ICD-10-CM

## 2024-03-17 DIAGNOSIS — I779 Disorder of arteries and arterioles, unspecified: Secondary | ICD-10-CM | POA: Diagnosis not present

## 2024-03-17 DIAGNOSIS — J449 Chronic obstructive pulmonary disease, unspecified: Secondary | ICD-10-CM

## 2024-03-17 DIAGNOSIS — K219 Gastro-esophageal reflux disease without esophagitis: Secondary | ICD-10-CM

## 2024-03-17 DIAGNOSIS — N1832 Chronic kidney disease, stage 3b: Secondary | ICD-10-CM | POA: Diagnosis not present

## 2024-03-17 DIAGNOSIS — I25119 Atherosclerotic heart disease of native coronary artery with unspecified angina pectoris: Secondary | ICD-10-CM | POA: Diagnosis not present

## 2024-03-17 DIAGNOSIS — E7800 Pure hypercholesterolemia, unspecified: Secondary | ICD-10-CM

## 2024-03-17 DIAGNOSIS — E041 Nontoxic single thyroid nodule: Secondary | ICD-10-CM

## 2024-03-17 LAB — HM DIABETES FOOT EXAM

## 2024-03-17 MED ORDER — RYBELSUS 3 MG PO TABS: 3.0000 mg | ORAL_TABLET | Freq: Every day | ORAL | 2 refills | Status: AC

## 2024-03-17 MED ORDER — HYDRALAZINE HCL 10 MG PO TABS: ORAL_TABLET | ORAL | 1 refills | Status: DC

## 2024-03-17 MED ORDER — OMEPRAZOLE 40 MG PO CPDR: DELAYED_RELEASE_CAPSULE | ORAL | 0 refills | Status: DC

## 2024-03-17 MED ORDER — ROSUVASTATIN CALCIUM 40 MG PO TABS: 40.0000 mg | ORAL_TABLET | Freq: Every day | ORAL | 3 refills | Status: AC

## 2024-03-17 NOTE — Progress Notes (Signed)
 Subjective:    [name], female    DOB: 1945-07-25, 78 y.o.   MRN: [id number]  Patient here for  Chief Complaint  Patient presents with   Medical Management of Chronic Issues    3 mth f/u    HPI Here for a scheduled follow up - follow up regarding hypertension, diabetes, hypercholesterolemia, CKD and sleep apnea. Had f/u with [facility] 09/10/22 - Carotid duplex mild 1 to 39% carotid artery stenosis bilaterally. ABIs were normal at 1.28 on the right and 1.25 on the left with triphasic waveforms. Recommended continuing antiplatelet therapy and f/u in 24 months. Continues cpap. Request new machine. Using her cpap regularly. Has not had recent sleep study. Discussed establishing with pulmonary for evaluation and follow up regarding her sleep apnea. Recent evaluation - acute care - diagnosed with gout. First episode. Discussed hydrochlorothiazide . Will hold on changing medication. Discussed recent labs. Elevated blood sugar - A1c 7.7. discussed diet and exercise.  Discussed treatment options. She declines injections. GFR 31. Hold on starting metformin . Continues on jardiance . Agreeable to start rybelsus. Overdue colonoscopy.    Past Medical History:  Diagnosis Date   Arthritis    Breast cancer (HCC)    Breast cancer, left (HCC) 2016   LT LUMPECTOMY - TI, NO, MO - IDC, ER/PR pos, Her 2 neg, Rad tx's.    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   Fatty liver    GERD (gastroesophageal reflux disease)    Glaucoma    High  cholesterol    History of chicken pox    Hyperglycemia    Hyperlipidemia    Hypertension    LVH (left ventricular hypertrophy)    PAC (premature atrial contraction)    Palpitations    Peripheral vascular disease    Personal history of radiation therapy 2016   BREAST CA   Pneumonia March 2014   Skin cancer 2013   Sleep apnea    wears CPAP set at 2.5   Wears glasses    Past Surgical History:  Procedure Laterality Date   BACK SURGERY  1986   ruptured disc   BREAST BIOPSY Left 2008   NEG   BREAST BIOPSY Left 08/20/2014   POS   BREAST BIOPSY Right 2007   NEG   BREAST EXCISIONAL BIOPSY Left 1984   NEG   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 HER2 negative.  Wide excision with SLN biopsy.  MammoSite radiation   CARPAL TUNNEL RELEASE     CATARACT EXTRACTION Right 07/2022   CATARACT EXTRACTION Left 09/2022   CHOLECYSTECTOMY  2006   COLONOSCOPY  2010   Dr. Dellie   COLONOSCOPY WITH PROPOFOL  N/A 05/17/2017   Procedure: COLONOSCOPY WITH PROPOFOL ;  Surgeon: Viktoria Lamar ONEIDA, MD;  Location: [facility] ENDOSCOPY;  Service: Endoscopy;  Laterality: N/A;   DILATION AND CURETTAGE OF UTERUS  ESOPHAGOGASTRODUODENOSCOPY (EGD) WITH PROPOFOL  N/A 05/17/2017   Procedure: ESOPHAGOGASTRODUODENOSCOPY (EGD) WITH PROPOFOL ;  Surgeon: Viktoria Lamar DASEN, MD;  Location: [facility] ENDOSCOPY;  Service: Endoscopy;  Laterality: N/A;   LAPAROTOMY FOR REMOVAL TUMOR LUMBAR PLEXES     Leg stent  2011   MOLE REMOVAL  2013   15 removed   POSTERIOR CERVICAL LAMINECTOMY Left 10/15/2015   Procedure: Left Cervical four- five Hemilaminectomy/Remove mass;  Surgeon: Catalina Stains, MD;  Location: [facility] NEURO ORS;  Service: Neurosurgery;  Laterality: Left;  Left C4-5 Hemilaminectomy/Remove mass   Family History  Problem Relation Age of Onset   Cancer Father        Prostate   Diabetes Father    Cerebrovascular Accident Father    Hypertension Mother    AAA  (abdominal aortic aneurysm) Mother    Hyperlipidemia Other        Parent   Miscarriages / Stillbirths Other        Parent   Hypertension Other        parent   Heart disease Other        Parent   Breast cancer Maternal Aunt 42   Social History   Socioeconomic History   Marital status: Married    Spouse name: Not on file   Number of children: 3   Years of education: 12   Highest education level: Not on file  Occupational History   Occupation: Caregiver   Tobacco Use   Smoking status: Never   Smokeless tobacco: Never  Substance and Sexual Activity   Alcohol use: No    Alcohol/week: 0.0 standard drinks of alcohol   Drug use: No   Sexual activity: Never  Other Topics Concern   Not on file  Social History Narrative   Regular exercise-mo   Caffeine Use-no   Social Drivers of Health   Financial Resource Strain: Low Risk  (12/14/2023)   Overall Financial Resource Strain (CARDIA)    Difficulty of Paying Living Expenses: Not hard at all  Food Insecurity: No Food Insecurity (12/14/2023)   Hunger Vital Sign    Worried About Running Out of Food in the Last Year: Never true    Ran Out of Food in the Last Year: Never true  Transportation Needs: No Transportation Needs (12/14/2023)   PRAPARE - Administrator, Civil Service (Medical): No    Lack of Transportation (Non-Medical): No  Physical Activity: Inactive (12/14/2023)   Exercise Vital Sign    Days of Exercise per Week: 0 days    Minutes of Exercise per Session: 0 min  Stress: No Stress Concern Present (12/14/2023)   [employer] of Occupational Health - Occupational Stress Questionnaire    Feeling of Stress: Not at all  Social Connections: Moderately Integrated (12/14/2023)   Social Connection and Isolation Panel    Frequency of Communication with Friends and Family: More than three times a week    Frequency of Social Gatherings with Friends and Family: More than three times a week    Attends Religious Services: More  than 4 times per year    Active Member of [employer] or Organizations: Yes    Attends Banker Meetings: More than 4 times per year    Marital Status: Widowed     Review of Systems  Constitutional:  Negative for appetite change and unexpected weight change.  HENT:  Negative for congestion and sinus pressure.   Respiratory:  Negative for cough, chest tightness and shortness of breath.   Cardiovascular:  Negative for chest pain, palpitations and leg swelling.  Gastrointestinal:  Negative for abdominal pain, diarrhea, nausea and vomiting.  Genitourinary:  Negative for difficulty urinating and dysuria.  Musculoskeletal:  Negative for joint swelling and myalgias.  Skin:  Negative for color change and rash.  Neurological:  Negative for dizziness and headaches.  Psychiatric/Behavioral:  Negative for agitation and dysphoric mood.        Objective:     BP 122/62   Pulse (!) 56   Temp 97.8 F (36.6 C) (Oral)   Ht 5' 5 (1.651 m)   Wt 188 lb 8 oz (85.5 kg)   SpO2 96%   BMI 31.37 kg/m  Wt Readings from Last 3 Encounters:  03/17/24 188 lb 8 oz (85.5 kg)  12/14/23 190 lb (86.2 kg)  12/10/23 193 lb 3.2 oz (87.6 kg)    Physical Exam Vitals reviewed.  Constitutional:      General: She is not in acute distress.    Appearance: Normal appearance.  HENT:     Head: Normocephalic and atraumatic.     Right Ear: External ear normal.     Left Ear: External ear normal.     Mouth/Throat:     Pharynx: No oropharyngeal exudate or posterior oropharyngeal erythema.  Eyes:     General: No scleral icterus.       Right eye: No discharge.        Left eye: No discharge.     Conjunctiva/sclera: Conjunctivae normal.  Neck:     Thyroid : No thyromegaly.  Cardiovascular:     Rate and Rhythm: Normal rate and regular rhythm.  Pulmonary:     Effort: No respiratory distress.     Breath sounds: Normal breath sounds. No wheezing.  Abdominal:     General: Bowel sounds are normal.      Palpations: Abdomen is soft.     Tenderness: There is no abdominal tenderness.  Musculoskeletal:        General: No swelling or tenderness.     Cervical back: Neck supple. No tenderness.  Lymphadenopathy:     Cervical: No cervical adenopathy.  Skin:    Findings: No erythema or rash.  Neurological:     Mental Status: She is alert.  Psychiatric:        Mood and Affect: Mood normal.        Behavior: Behavior normal.         Outpatient Encounter Medications as of 03/17/2024  Medication Sig   acetaminophen  (TYLENOL ) 650 MG CR tablet Take 650 mg by mouth as needed for pain.   albuterol  (VENTOLIN  HFA) 108 (90 Base) MCG/ACT inhaler Inhale 2 puffs into the lungs every 6 (six) hours as needed for wheezing or shortness of breath.   aspirin  325 MG tablet Take 325 mg by mouth daily.   carvedilol  (COREG ) 12.5 MG tablet TAKE 1 TABLET(12.5 MG) BY MOUTH TWICE DAILY WITH MEALS   Cholecalciferol 25 MCG (1000 UT) tablet Take 1,000 Units by mouth daily.    glucose blood (ONETOUCH VERIO) test strip Use to check blood sugars up to three times daily.   JARDIANCE  25 MG TABS tablet TAKE ONE TABLET BY MOUTH DAILY   losartan -hydrochlorothiazide  (HYZAAR) 100-25 MG tablet TAKE 1 TABLET BY MOUTH DAILY   meclizine  (ANTIVERT ) 25 MG tablet Take 1 tablet (25 mg total) by mouth daily as needed for dizziness.   OneTouch Delica Lancets 30G MISC Use to check blood sugars up to 3 times daily.   Semaglutide (RYBELSUS) 3 MG  TABS Take 1 tablet (3 mg total) by mouth daily.   vitamin E 400 UNIT capsule Take 400 Units by mouth daily.   hydrALAZINE  (APRESOLINE ) 10 MG tablet TAKE 1 TABLET(10 MG) BY MOUTH THREE TIMES DAILY   omeprazole  (PRILOSEC) 40 MG capsule TAKE 1 CAPSULE(40 MG) BY MOUTH DAILY   rosuvastatin  (CRESTOR ) 40 MG tablet Take 1 tablet (40 mg total) by mouth daily.   [DISCONTINUED] hydrALAZINE  (APRESOLINE ) 10 MG tablet TAKE 1 TABLET(10 MG) BY MOUTH THREE TIMES DAILY   [DISCONTINUED] omeprazole  (PRILOSEC) 40 MG  capsule TAKE 1 CAPSULE(40 MG) BY MOUTH DAILY   [DISCONTINUED] rosuvastatin  (CRESTOR ) 40 MG tablet Take 1 tablet (40 mg total) by mouth daily.   No facility-administered encounter medications on file as of 03/17/2024.     Lab Results  Component Value Date   WBC 6.1 02/19/2022   HGB 12.8 02/19/2022   HCT 37.7 02/19/2022   PLT 172.0 02/19/2022   GLUCOSE 123 (H) 03/16/2024   CHOL 127 03/16/2024   TRIG 272.0 (H) 03/16/2024   HDL 38.60 (L) 03/16/2024   LDLDIRECT 71.0 02/19/2022   LDLCALC 34 03/16/2024   ALT 17 03/16/2024   AST 15 03/16/2024   NA 140 03/16/2024   K 4.0 03/16/2024   CL 104 03/16/2024   CREATININE 1.54 (H) 03/16/2024   BUN 30 (H) 03/16/2024   CO2 26 03/16/2024   TSH 3.85 06/10/2023   INR 0.9 07/13/2012   HGBA1C 7.7 (H) 03/16/2024   MICROALBUR 1.2 03/17/2024    DG HIP UNILAT WITH PELVIS 2-3 VIEWS RIGHT Result Date: 12/14/2023 CLINICAL DATA:  Persistent intermittent right groin and hip pain for the past 3-4 weeks. No known injury. EXAM: DG HIP (WITH OR WITHOUT PELVIS) 2-3V RIGHT COMPARISON:  None Available. FINDINGS: Normal-appearing right hip. Extensive lower lumbar spine degenerative changes. Right iliac vascular stent. IMPRESSION: 1. Normal-appearing right hip. 2. Extensive lower lumbar spine degenerative changes. Electronically Signed   By: Elspeth Bathe M.D.   On: 12/14/2023 14:36       Assessment & Plan:  Obstructive sleep apnea syndrome -     For home use only DME continuous positive airway pressure (CPAP)  Encounter for screening mammogram for malignant neoplasm of breast  Type 2 diabetes mellitus with other circulatory complication, without long-term current use of insulin (HCC)  Diabetes mellitus with cardiac complication (HCC) Assessment & Plan: Continue jardiance . A1c 7.7.  did not tolerate metformin . Discussed diet and exercise. Discussed treatment options. Declines injectable GLP 1 agonist. Agreeable to rybelsus trial. Follow met b and A1c.    Orders: -     Microalbumin / creatinine urine ratio  Coronary artery disease involving native coronary artery of native heart with angina pectoris Assessment & Plan: No chest pain. Continue risk factor modification.    Bilateral carotid artery disease, unspecified type Assessment & Plan: Evaluated [facility] 09/2022 1-39% carotid ultrasound.  Recommended f/u in 2 years.     Stage 3b chronic kidney disease (HCC) Assessment & Plan: Avoid anti-inflammatories..  Stay hydrated. Urine - no protein.  Follow metabolic panel. Continue ARB. Most recent GFR decreased - 31/ follow metabolic panel.    Chronic obstructive pulmonary disease, unspecified COPD type (HCC) Assessment & Plan: Breathing stable.    Gastroesophageal reflux disease without esophagitis Assessment & Plan: No upper symptoms reported. Continue prilosec.    History of breast cancer Assessment & Plan: Completed femara .  Mammogram 06/23/22 - Birads I. Schedule f/u mammogram. Overdue mammogram. Ordered. Need to schedule.    History of  colon polyps Assessment & Plan: Last colonoscopy January 2019 (Dr. Viktoria).  Per patient recommended follow-up in 3 years.  Overdue follow-up. Have discussed.  Declines.    Hypercholesterolemia Assessment & Plan: On Crestor .  Low-cholesterol diet and exercise.  Follow lipid panel and liver function tests.   Lab Results  Component Value Date   CHOL 127 03/16/2024   HDL 38.60 (L) 03/16/2024   LDLCALC 34 03/16/2024   LDLDIRECT 71.0 02/19/2022   TRIG 272.0 (H) 03/16/2024   CHOLHDL 3 03/16/2024      Primary hypertension Assessment & Plan: Continue losartan /hctz, coreg  and hydralazine .  Follow pressures.  Follow metabolic panel. Blood pressures as outlined. No change in medication today.    OSA on CPAP Assessment & Plan: Using cpap regularly. Benefiting from use. Reports needs new machine. Order placed. Discussed establishing with pulmonary. Agreeable for referral.   Orders: -      Pulmonary Visit  Thyroid  nodule Assessment & Plan: Evaluated by Dr Damian.  Biopsy negative.  Follow tsh.     Gout involving toe, unspecified cause, unspecified chronicity, unspecified laterality Assessment & Plan: One episode - diagnosed gout. Discussed hydrochlorothiazide . Will continue for now. Follow. Wants to monitor dietary intake.    Other orders -     hydrALAZINE  HCl; TAKE 1 TABLET(10 MG) BY MOUTH THREE TIMES DAILY  Dispense: 270 tablet; Refill: 1 -     Omeprazole ; TAKE 1 CAPSULE(40 MG) BY MOUTH DAILY  Dispense: 90 capsule; Refill: 0 -     Rosuvastatin  Calcium ; Take 1 tablet (40 mg total) by mouth daily.  Dispense: 90 tablet; Refill: 3 -     Rybelsus; Take 1 tablet (3 mg total) by mouth daily.  Dispense: 30 tablet; Refill: 2     Allena Hamilton, MD

## 2024-03-18 ENCOUNTER — Ambulatory Visit: Payer: Self-pay | Admitting: Internal Medicine

## 2024-03-18 ENCOUNTER — Encounter: Payer: Self-pay | Admitting: Internal Medicine

## 2024-03-18 DIAGNOSIS — M109 Gout, unspecified: Secondary | ICD-10-CM | POA: Insufficient documentation

## 2024-03-18 DIAGNOSIS — E1159 Type 2 diabetes mellitus with other circulatory complications: Secondary | ICD-10-CM

## 2024-03-18 LAB — MICROALBUMIN / CREATININE URINE RATIO
Creatinine, Urine: 41 mg/dL (ref 20–275)
Microalb Creat Ratio: 29 mg/g{creat} (ref <30)
Microalb, Ur: 1.2 mg/dL

## 2024-03-18 NOTE — Assessment & Plan Note (Signed)
 Continue jardiance . A1c 7.7.  did not tolerate metformin . Discussed diet and exercise. Discussed treatment options. Declines injectable GLP 1 agonist. Agreeable to rybelsus trial. Follow met b and A1c.

## 2024-03-18 NOTE — Telephone Encounter (Signed)
 Signed.

## 2024-03-18 NOTE — Assessment & Plan Note (Signed)
Evaluated [facility] 09/2022 1-39% carotid ultrasound.  Recommended f/u in 2 years.

## 2024-03-18 NOTE — Assessment & Plan Note (Signed)
 Using cpap regularly. Benefiting from use. Reports needs new machine. Order placed. Discussed establishing with pulmonary. Agreeable for referral.

## 2024-03-18 NOTE — Assessment & Plan Note (Signed)
 One episode - diagnosed gout. Discussed hydrochlorothiazide . Will continue for now. Follow. Wants to monitor dietary intake.

## 2024-03-18 NOTE — Assessment & Plan Note (Signed)
No upper symptoms reported.  Continue prilosec.  

## 2024-03-18 NOTE — Assessment & Plan Note (Signed)
 Breathing stable.

## 2024-03-18 NOTE — Assessment & Plan Note (Signed)
 Avoid anti-inflammatories..  Stay hydrated. Urine - no protein.  Follow metabolic panel. Continue ARB. Most recent GFR decreased - 31/ follow metabolic panel.

## 2024-03-18 NOTE — Assessment & Plan Note (Signed)
 On Crestor .  Low-cholesterol diet and exercise.  Follow lipid panel and liver function tests.   Lab Results  Component Value Date   CHOL 127 03/16/2024   HDL 38.60 (L) 03/16/2024   LDLCALC 34 03/16/2024   LDLDIRECT 71.0 02/19/2022   TRIG 272.0 (H) 03/16/2024   CHOLHDL 3 03/16/2024

## 2024-03-18 NOTE — Assessment & Plan Note (Signed)
 Continue losartan /hctz, coreg  and hydralazine .  Follow pressures.  Follow metabolic panel. Blood pressures as outlined. No change in medication today.

## 2024-03-18 NOTE — Assessment & Plan Note (Signed)
Last colonoscopy January 2019 (Dr. Elliott).  Per patient recommended follow-up in 3 years.  Overdue follow-up. Have discussed.  Declines.  

## 2024-03-18 NOTE — Assessment & Plan Note (Signed)
Evaluated by Dr Solum.  Biopsy negative.  Follow tsh.   

## 2024-03-18 NOTE — Assessment & Plan Note (Signed)
 No chest pain.  Continue risk factor modification.

## 2024-03-18 NOTE — Assessment & Plan Note (Signed)
 Completed femara .  Mammogram 06/23/22 - Birads I. Schedule f/u mammogram. Overdue mammogram. Ordered. Need to schedule.

## 2024-03-23 ENCOUNTER — Telehealth: Payer: Self-pay | Admitting: Internal Medicine

## 2024-03-23 NOTE — Telephone Encounter (Signed)
 Forms received will review tomorrow

## 2024-03-23 NOTE — Telephone Encounter (Signed)
 Pt dropped of pt assistance papers to be filled out. They are in Dr Freda color folder up front

## 2024-03-28 ENCOUNTER — Ambulatory Visit (INDEPENDENT_AMBULATORY_CARE_PROVIDER_SITE_OTHER): Admitting: Sleep Medicine

## 2024-03-28 ENCOUNTER — Encounter: Payer: Self-pay | Admitting: Sleep Medicine

## 2024-03-28 VITALS — BP 132/64 | HR 56 | Temp 98.1°F | Ht 65.0 in | Wt 192.0 lb

## 2024-03-28 DIAGNOSIS — I10 Essential (primary) hypertension: Secondary | ICD-10-CM | POA: Diagnosis not present

## 2024-03-28 DIAGNOSIS — G4733 Obstructive sleep apnea (adult) (pediatric): Secondary | ICD-10-CM

## 2024-03-28 NOTE — Progress Notes (Signed)
 Name:Lindsey Bentley MRN: [id number] DOB: [birth year]   CHIEF COMPLAINT:  ESTABLISH CARE FOR OSA   HISTORY OF PRESENT ILLNESS: Lindsey Bentley is a 78 y.o. w/ a h/o OSA, HTN, CAD, COPD, DMII, obesity  and GERD who presents to establish care for OSA. Reports that she was initially diagnosed with OSA 10 years ago and was subsequently started on CPAP therapy. Reports using CPAP therapy every night, which is confirmed by compliance data. She is currently using the Simplus FFM, which is comfortable. Reports feeling more refreshed upon awakening with CPAP therapy.   States that her CPAP motor started malfunctioning around 3 weeks ago. Denies any nocturnal awakenings. Denies any significant weight changes. Denies morning headaches, RLS symptoms, dream enactment, cataplexy, hypnagogic or hypnapompic hallucinations. Denies a family history of sleep apnea. Denies drowsy driving. Drinks 2 cups of coffee daily, denies alcohol, tobacco or illicit drug use.   Bedtime 10 pm Sleep onset 20 mins Rise time 6:30-7:30 am   EPWORTH SLEEP SCORE     03/28/2024    1:47 PM  Results of the Epworth flowsheet  Sitting and reading 0  Watching TV 0  Sitting, inactive in a public place (e.g. a theatre or a meeting) 0  As a passenger in a car for an hour without a break 0  Lying down to rest in the afternoon when circumstances permit 2  Sitting and talking to someone 0  Sitting quietly after a lunch without alcohol 0  In a car, while stopped for a few minutes in traffic 0  Total score 2     PAST MEDICAL HISTORY :   has a past medical history of Arthritis, Breast cancer (HCC), Breast cancer, left (HCC) (2016), Carotid artery occlusion, Carotid artery occlusion, Cervical mass, CHF (congestive heart failure) (HCC), COPD (chronic obstructive pulmonary disease) (HCC), Coronary artery disease, Depression, Diffuse cystic mastopathy (2013), DVT (deep venous thrombosis) (HCC), Dyspnea, Elevated TSH, Family history of  adverse reaction to anesthesia, Fatty liver, GERD (gastroesophageal reflux disease), Glaucoma, High cholesterol, History of chicken pox, Hyperglycemia, Hyperlipidemia, Hypertension, LVH (left ventricular hypertrophy), PAC (premature atrial contraction), Palpitations, Peripheral vascular disease, Personal history of radiation therapy (2016), Pneumonia (March 2014), Skin cancer (2013), Sleep apnea, and Wears glasses.  has a past surgical history that includes Cholecystectomy (2006); Leg stent (2011); Back surgery (1986); Dilation and curettage of uterus; Colonoscopy (2010); Mole removal (2013); Breast surgery (Left, 09/05/2014); Posterior cervical laminectomy (Left, 10/15/2015); Breast biopsy (Left, 2008); Breast biopsy (Left, 08/20/2014); Breast excisional biopsy (Left, 1984); Breast biopsy (Right, 2007); Breast lumpectomy (Left, 08/2014); Laparotomy for removal tumor lumbar plexes; Carpal tunnel release; Esophagogastroduodenoscopy (egd) with propofol  (N/A, 05/17/2017); Colonoscopy with propofol  (N/A, 05/17/2017); Cataract extraction (Right, 07/2022); and Cataract extraction (Left, 09/2022). Prior to Admission medications   Medication Sig Start Date End Date Taking? Authorizing Provider  acetaminophen  (TYLENOL ) 650 MG CR tablet Take 650 mg by mouth as needed for pain.    [provider]  albuterol  (VENTOLIN  HFA) 108 (90 Base) MCG/ACT inhaler Inhale 2 puffs into the lungs every 6 (six) hours as needed for wheezing or shortness of breath. 06/14/23   Glendia Shad, MD  aspirin  325 MG tablet Take 325 mg by mouth daily.    [provider]  carvedilol  (COREG ) 12.5 MG tablet TAKE 1 TABLET(12.5 MG) BY MOUTH TWICE DAILY WITH MEALS 02/10/24   Glendia Shad, MD  Cholecalciferol 25 MCG (1000 UT) tablet Take 1,000 Units by mouth daily.     [provider]  glucose blood (ONETOUCH VERIO) test strip Use to check blood sugars up to three times daily. 04/08/20   Glendia Shad, MD  hydrALAZINE   (APRESOLINE ) 10 MG tablet TAKE 1 TABLET(10 MG) BY MOUTH THREE TIMES DAILY 03/17/24   Glendia Shad, MD  JARDIANCE  25 MG TABS tablet TAKE ONE TABLET BY MOUTH DAILY 12/30/23   Glendia Shad, MD  losartan -hydrochlorothiazide  (HYZAAR) 100-25 MG tablet TAKE 1 TABLET BY MOUTH DAILY 02/16/24   Glendia Shad, MD  meclizine  (ANTIVERT ) 25 MG tablet Take 1 tablet (25 mg total) by mouth daily as needed for dizziness. 11/05/21   Glendia Shad, MD  omeprazole  (PRILOSEC) 40 MG capsule TAKE 1 CAPSULE(40 MG) BY MOUTH DAILY 03/17/24   Glendia Shad, MD  OneTouch Delica Lancets 30G MISC Use to check blood sugars up to 3 times daily. 04/08/20   Glendia Shad, MD  rosuvastatin  (CRESTOR ) 40 MG tablet Take 1 tablet (40 mg total) by mouth daily. 03/17/24   Glendia Shad, MD  Semaglutide  (RYBELSUS ) 3 MG TABS Take 1 tablet (3 mg total) by mouth daily. 03/17/24   Glendia Shad, MD  vitamin E 400 UNIT capsule Take 400 Units by mouth daily.    [provider]   Allergies  Allergen Reactions   Codeine Nausea And Vomiting   Prednisone Swelling   Silicone Dermatitis and Itching    Please use paper tape   Tape Itching and Rash    Please use paper tape Please use paper tape    FAMILY HISTORY:  family history includes AAA (abdominal aortic aneurysm) in her mother; Breast cancer (age of onset: 39) in her maternal aunt; Cancer in her father; Cerebrovascular Accident in her father; Diabetes in her father; Heart disease in an other family member; Hyperlipidemia in an other family member; Hypertension in her mother and another family member; Miscarriages / Stillbirths in an other family member. SOCIAL HISTORY:  reports that she has never smoked. She has never used smokeless tobacco. She reports that she does not drink alcohol and does not use drugs.   Review of Systems:  Gen:  Denies  fever, sweats, chills weight loss  HEENT: Denies blurred vision, double vision, ear pain, eye pain, hearing loss, nose  bleeds, sore throat Cardiac:  No dizziness, chest pain or heaviness, chest tightness,edema, No JVD Resp:   No cough, -sputum production, -shortness of breath,-wheezing, -hemoptysis,  Gi: Denies swallowing difficulty, stomach pain, nausea or vomiting, diarrhea, constipation, bowel incontinence Gu:  Denies bladder incontinence, burning urine Ext:   Denies Joint pain, stiffness or swelling Skin: Denies  skin rash, easy bruising or bleeding or hives Endoc:  Denies polyuria, polydipsia , polyphagia or weight change Psych:   Denies depression, insomnia or hallucinations  Other:  All other systems negative  VITAL SIGNS: BP 132/64   Pulse (!) 56   Temp 98.1 F (36.7 C)   Ht 5' 5 (1.651 m)   Wt 192 lb (87.1 kg)   SpO2 98%   BMI 31.95 kg/m    Physical Examination:   General Appearance: No distress  EYES PERRLA, EOM intact.   NECK Supple, No JVD Pulmonary: normal breath sounds, No wheezing.  CardiovascularNormal S1,S2.  No m/r/g.   Abdomen: Benign, Soft, non-tender. Skin:   warm, no rashes, no ecchymosis  Extremities: normal, no cyanosis, clubbing. Neuro:without focal findings,  speech normal  PSYCHIATRIC: Mood, affect within normal limits.   ASSESSMENT AND PLAN  OSA Due to patient needing a new CPAP device, will complete a home sleep study.  Discussed the consequences of untreated sleep apnea. Advised not to drive drowsy for safety of patient and others. Will complete further evaluation with a home sleep study and send order for new CPAP device.    HTN Stable, on current management. Following with PCP.    MEDICATION ADJUSTMENTS/LABS AND TESTS ORDERED: Recommend Sleep Study   Patient  satisfied with Plan of action and management. All questions answered  Follow up to review HST results and treatment plan.   I spent a total of 35 minutes reviewing chart data, face-to-face evaluation with the patient, counseling and coordination of care as detailed above.    Breezy Hertenstein,  M.D.  Sleep Medicine [facility]

## 2024-03-28 NOTE — Patient Instructions (Signed)
 Lindsey Bentley

## 2024-03-28 NOTE — Telephone Encounter (Signed)
 Signed and placed in box.

## 2024-03-29 NOTE — Telephone Encounter (Signed)
 Pt wanted to know if you had an alternative option since the Rybelsus was too expensive

## 2024-03-29 NOTE — Telephone Encounter (Signed)
Picked up today.

## 2024-03-29 NOTE — Telephone Encounter (Signed)
Pt notified & form placed up front for pick up.

## 2024-04-05 ENCOUNTER — Other Ambulatory Visit: Payer: Self-pay | Admitting: *Deleted

## 2024-04-05 DIAGNOSIS — E7800 Pure hypercholesterolemia, unspecified: Secondary | ICD-10-CM

## 2024-04-10 ENCOUNTER — Encounter

## 2024-04-10 ENCOUNTER — Other Ambulatory Visit (INDEPENDENT_AMBULATORY_CARE_PROVIDER_SITE_OTHER)

## 2024-04-10 DIAGNOSIS — E7800 Pure hypercholesterolemia, unspecified: Secondary | ICD-10-CM | POA: Diagnosis not present

## 2024-04-11 ENCOUNTER — Ambulatory Visit: Payer: Self-pay | Admitting: Internal Medicine

## 2024-04-11 LAB — BASIC METABOLIC PANEL WITH GFR
BUN: 31 mg/dL — ABNORMAL HIGH (ref 6–23)
CO2: 24 meq/L (ref 19–32)
Calcium: 9.6 mg/dL (ref 8.4–10.5)
Chloride: 101 meq/L (ref 96–112)
Creatinine, Ser: 1.29 mg/dL — ABNORMAL HIGH (ref 0.40–1.20)
GFR: 39.43 mL/min — ABNORMAL LOW (ref >60.00)
Glucose, Bld: 96 mg/dL (ref 70–99)
Potassium: 4 meq/L (ref 3.5–5.1)
Sodium: 135 meq/L (ref 135–145)

## 2024-04-13 ENCOUNTER — Telehealth: Payer: Self-pay

## 2024-04-13 NOTE — Progress Notes (Unsigned)
 Complex Care Management Note  Care Guide Note 04/13/2024 Name: KENIAH KLEMMER MRN: [id number] DOB: 01/24/1946  NITYA CAUTHON is a 78 y.o. year old female who sees Glendia Shad, MD for primary care. I reached out to [employer] by phone today to offer complex care management services.  Ms. Milo was given information about Complex Care Management services today including:   The Complex Care Management services include support from the care team which includes your Nurse Care Manager, Clinical Social Worker, or Pharmacist.  The Complex Care Management team is here to help remove barriers to the health concerns and goals most important to you. Complex Care Management services are voluntary, and the patient may decline or stop services at any time by request to their care team member.   Complex Care Management Consent Status: Patient wishes to consider information provided and/or speak with a member of the care team before deciding to participate in complex care management services.   Follow up plan:  The care guide will reach out to the patient again over the next 7 days.  Encounter Outcome:  Patient wants to verify referral first with PCP.  Dreama [facility]  [facility], [facility] VBCI Assistant Direct Dial: [phone]  Fax: [phone]

## 2024-04-17 ENCOUNTER — Other Ambulatory Visit (HOSPITAL_COMMUNITY): Payer: Self-pay

## 2024-04-17 ENCOUNTER — Telehealth: Payer: Self-pay

## 2024-04-17 NOTE — Telephone Encounter (Signed)
 Spoke to pt. Also called pharmacy for pt.

## 2024-04-17 NOTE — Telephone Encounter (Signed)
 Copied from CRM [id number]. Topic: Clinical - Medication Prior Auth >> Apr 17, 2024 11:26 AM Lonell PEDLAR wrote: Reason for CRM: Patient was advised that rx for rosuvastatin  (CRESTOR ) 40 MG tablet needs PA and was told it would be completed and sent today, she has not heard anything from office. Please review and adivse.

## 2024-04-17 NOTE — Telephone Encounter (Signed)
 Per test claim prior auth not needed. It was filled on 04/15/2024

## 2024-04-20 DIAGNOSIS — G4733 Obstructive sleep apnea (adult) (pediatric): Secondary | ICD-10-CM | POA: Diagnosis not present

## 2024-04-20 DIAGNOSIS — G4730 Sleep apnea, unspecified: Secondary | ICD-10-CM | POA: Diagnosis not present

## 2024-04-20 NOTE — Progress Notes (Unsigned)
 Complex Care Management Note Care Guide Note  04/20/2024 Name: Lindsey Bentley MRN: [id number] DOB: 03-Nov-1945   Complex Care Management Outreach Attempts: A second unsuccessful outreach was attempted today to offer the patient with information about available complex care management services.  Follow Up Plan:  Additional outreach attempts will be made to offer the patient complex care management information and services.   Encounter Outcome:  No Answer  Dreama [facility]  [facility], [facility] VBCI Assistant Direct Dial: [phone]  Fax: [phone]

## 2024-04-21 NOTE — Progress Notes (Addendum)
 Complex Care Management Note  Care Guide Note 04/21/2024 Name: Lindsey Bentley MRN: [id number] DOB: 1945/10/21  Lindsey Bentley is a 78 y.o. year old female who sees Glendia Shad, MD for primary care. I reached out to [employer] by phone today to offer complex care management services.  [name] was given information about Complex Care Management services today including:   The Complex Care Management services include support from the care team which includes your Nurse Care Manager, Clinical Social Worker, or Pharmacist.  The Complex Care Management team is here to help remove barriers to the health concerns and goals most important to you. Complex Care Management services are voluntary, and the patient may decline or stop services at any time by request to their care team member.   Complex Care Management Consent Status: Patient agreed to services and verbal consent obtained.   Follow up plan:  Telephone appointment with complex care management team member scheduled for:  04/24/24 at 11:00 a.m.   Encounter Outcome:  Patient Scheduled  Dreama [facility]  [facility], [facility] VBCI Assistant Direct Dial: [phone]  Fax: [phone]

## 2024-04-24 ENCOUNTER — Other Ambulatory Visit: Admitting: Pharmacist

## 2024-04-24 ENCOUNTER — Other Ambulatory Visit

## 2024-04-24 DIAGNOSIS — E1159 Type 2 diabetes mellitus with other circulatory complications: Secondary | ICD-10-CM

## 2024-04-24 NOTE — Progress Notes (Signed)
 Sounds good to me. Just let me know what you need me to do.

## 2024-04-24 NOTE — Progress Notes (Signed)
 04/24/2024 Name: Lindsey Bentley MRN: [id number] DOB: [birth year]  Subjective  Chief Complaint  Patient presents with   Medication Access   Diabetes    Care Team: Primary Care Provider: Glendia Shad, MD  Reason for visit: ?  Lindsey Bentley is a 78 y.o. female with a history of diabetes (type 2), who presents today for an initial visit related to medication access concerns related to new prescription for Rybelsus .   Medication Access: ?  Reports that all medications are not affordable. Rybelsus  copay too high.   Prescription drug coverage: YES Payor: [employer] / Plan: UHC MEDICARE / Product Type: *No Product type* / .  Summary of Benefit: Drug Deductible ~$250 then copay of $47/month for preferred brand medication thereafter  Current Patient Assistance: [employer] (BI Cares) - Jardiance   2025 Poverty Guidelines  Family Size  250%  400%  1  $39,125  $62,600  2  $52,875 $84,600  3  $66,625 $106,600  4  $80,375 $128,600  Common  Medications Jardiance  Tradjenta Synjardy/XR Novolog Tresiba Pen Needles Glucagon  Trulicity Humalog Basaglar  Januvia Janumet/XR  Lantus inulin aspart (Merilog) Toujeo/Max Lovenox   ____________________________________________  Objective    Labs:?  Lab Results  Component Value Date   HGBA1C 7.7 (H) 03/16/2024   HGBA1C 7.4 (H) 12/07/2023   HGBA1C 7.5 (H) 06/10/2023   GLUCOSE 96 04/10/2024   MICRALBCREAT 29 03/17/2024   MICRALBCREAT 2 12/17/2016   CREATININE 1.29 (H) 04/10/2024   CREATININE 1.54 (H) 03/16/2024   CREATININE 1.28 (H) 12/07/2023   GFR 39.43 (L) 04/10/2024   GFR 31.90 (L) 03/16/2024   GFR 39.90 (L) 12/07/2023   Lab Results  Component Value Date   CHOL 127 03/16/2024   LDLCALC 34 03/16/2024   LDLCALC 57 12/07/2023   LDLCALC 55 06/10/2023   LDLDIRECT 71.0 02/19/2022   LDLDIRECT 70.0 10/23/2021   HDL 38.60 (L) 03/16/2024   TRIG 272.0 (H) 03/16/2024   TRIG 220.0 (H) 12/07/2023    TRIG 163.0 (H) 06/10/2023   ALT 17 03/16/2024   ALT 20 12/07/2023   AST 15 03/16/2024   AST 17 12/07/2023     Chemistry      Component Value Date/Time   NA 135 04/10/2024 1417   NA 138 10/23/2013 1236   K 4.0 04/10/2024 1417   K 3.8 08/27/2014 0849   CL 101 04/10/2024 1417   CL 104 10/23/2013 1236   CO2 24 04/10/2024 1417   CO2 28 10/23/2013 1236   BUN 31 (H) 04/10/2024 1417   BUN 17 10/23/2013 1236   CREATININE 1.29 (H) 04/10/2024 1417   CREATININE 0.87 10/23/2013 1236      Component Value Date/Time   CALCIUM  9.6 04/10/2024 1417   CALCIUM  9.3 10/23/2013 1236   ALKPHOS 59 03/16/2024 0750   ALKPHOS 49 (L) 07/13/2012 1235   AST 15 03/16/2024 0750   AST 39 (H) 07/13/2012 1235   ALT 17 03/16/2024 0750   ALT 73 07/13/2012 1235   BILITOT 0.5 03/16/2024 0750   BILITOT 0.5 07/13/2012 1235      Assessment and Plan:   1. Medication access (Diabetes Type 2): uncontrolled per last A1c of 7.7% (03/16/24), increased from previous, 7.2%. Goal <7% without hypoglycemia.  Last PCP visit, discussed adding Rybelsus  though this was cost prohibitive. Unfortunately, no patient assistance options are available for Rybelsus . Declines injectable therapy per discussions with PCP. All brand medications will be cost prohibitive (preferred brands = $47/month after deductible each year).   Considerations  forwarded to PCP: DPP4i is a reasonable consideration given cost barriers to oral GLP1 at this time and patient preference to avoid injectable therapies. DPP4i less effective than GLP1 (and without CV benefit) though with A1c relatively close to goal, may be sufficient for glycemic control with ongoing efforts toward diet and exercise which was discussed with patient today. Expected A1c reduction up to 0.7 percentage points. Minimal adverse effects compared to GLP1.   DPP4i: Consider Tradjenta given patient is already approved for the [facility] Cares program for Jardiance   No renal dose adjustment for  mod-severe renal impairment. Standard dosing = 5 mg once daily.  eGR stable at baseline upper 30s. Last 39.43 (04/10/24).  GLP1: Ideal in terms of A1c-lowering efficacy + additional cardio-renal risk reduction. Cost is a barrier to Rybelsus  at this time. Declines injectable therapy.  Metformin : Did not tolerate SU: Not unreasonable, though defer given alternative options with lower risk of hypoglycemia.  TZD: Avoid in the setting of HF diagnosis    Follow Up Patient given direct line for questions regarding medication therapy  Future Appointments  Date Time Provider Department Center  06/20/2024  7:45 AM [facility] [address]  06/22/2024  2:00 PM Glendia Shad, MD [facility] [address]  06/28/2024  3:15 PM Jess Devona BIRCH, MD [facility] [address]  12/18/2024 10:50 AM [facility] ANNUAL WELLNESS VISIT [facility] [address] FABIENE Geronimo, PharmD Clinical Pharmacist [facility] [phone]

## 2024-04-25 ENCOUNTER — Encounter: Payer: Self-pay | Admitting: Pharmacist

## 2024-04-25 NOTE — Progress Notes (Signed)
 Patient Assistance Program (PAP) Application   Manufacturer: [employer] (BI Cares)  - Already enrolled through 2026 for Jardiance  Medication(s): Add Tradjenta 5 mg   Provider Portion/Prescription page of Application:  12/16: Provider portion completed by Pharmacist and placed in PCP inbox for signature.  Once signed, to be faxed to [facility] team, [phone]

## 2024-04-25 NOTE — Progress Notes (Signed)
 Signed and placed in box.

## 2024-04-26 ENCOUNTER — Telehealth: Payer: Self-pay

## 2024-04-26 DIAGNOSIS — G4733 Obstructive sleep apnea (adult) (pediatric): Secondary | ICD-10-CM

## 2024-04-26 NOTE — Telephone Encounter (Signed)
 Copied from CRM #[id number]. Topic: Clinical - Lab/Test Results >> Apr 26, 2024  9:36 AM Lindsey Bentley wrote: Reason for CRM: checking on the staus of my cpap testing . they gave the informationt o dr reddy on the 11th . have not heard from anyone  [phone]

## 2024-05-02 NOTE — Addendum Note (Signed)
 Addended by: Rosezetta Balderston J on: 05/02/2024 10:00 AM   Modules accepted: Orders

## 2024-05-02 NOTE — Telephone Encounter (Signed)
 Per Dr. Jess she is looking into her results now.

## 2024-05-02 NOTE — Telephone Encounter (Signed)
 Spoke with pt to notify of results and updated CPAP order has been placed.

## 2024-05-02 NOTE — Telephone Encounter (Signed)
 Copied from CRM [id number]. Topic: Clinical - Medical Advice >> May 02, 2024  9:50 AM [name] wrote: Reason for CRM: Patient is requesting to speak to Dr.Reddy's nurse in regard to her CPAP machine.   Callback number: [phone]

## 2024-05-05 ENCOUNTER — Telehealth: Payer: Self-pay

## 2024-05-05 NOTE — Telephone Encounter (Signed)
 Received Provider port of PAP application for Tradjenta- mailed application for tradjenta (BI) to patient's home address.

## 2024-05-16 ENCOUNTER — Other Ambulatory Visit: Payer: Self-pay | Admitting: Internal Medicine

## 2024-05-29 NOTE — Telephone Encounter (Signed)
 PAP: Application for Eddye Goodie has been submitted to [employer]), via fax

## 2024-05-31 NOTE — Telephone Encounter (Signed)
 Hi, we got the approval from bi-cares in our fax. I scanned in media for you

## 2024-06-15 ENCOUNTER — Telehealth: Payer: Self-pay

## 2024-06-15 NOTE — Telephone Encounter (Signed)
 Copied from CRM [id number]. Topic: Clinical - Medication Refill >> Jun 15, 2024 11:13 AM Antony RAMAN wrote: Medication: omeprazole  (PRILOSEC) 40 MG capsule  Has the patient contacted their pharmacy? Yes (Agent: If no, request that the patient contact the pharmacy for the refill. If patient does not wish to contact the pharmacy document the reason why and proceed with request.) (Agent: If yes, when and what did the pharmacy advise?)  This is the patient's preferred pharmacy:  [id number] [place], [place] - [address] AT [facility] OF [address] [place] [address] Phone: [phone] Fax: [phone]   Is this the correct pharmacy for this prescription? Yes If no, delete pharmacy and type the correct one.   Has the prescription been filled recently? No  Is the patient out of the medication? No  Has the patient been seen for an appointment in the last year OR does the patient have an upcoming appointment? Yes  Can we respond through MyChart? Yes  Agent: Please be advised that Rx refills may take up to 3 business days. We ask that you follow-up with your pharmacy. >> Jun 15, 2024 11:16 AM Antony S wrote: Add : hydrALAZINE  (APRESOLINE ) 10 MG tablet

## 2024-06-16 MED ORDER — HYDRALAZINE HCL 10 MG PO TABS: ORAL_TABLET | ORAL | 1 refills | Status: AC

## 2024-06-16 MED ORDER — OMEPRAZOLE 40 MG PO CPDR: DELAYED_RELEASE_CAPSULE | ORAL | 0 refills | Status: AC

## 2024-06-16 NOTE — Telephone Encounter (Signed)
 Refill sent to [facility]

## 2024-06-16 NOTE — Addendum Note (Signed)
 Addended by: Ronney [employer]well on: 06/16/2024 08:18 AM   Modules accepted: Orders

## 2024-06-20 ENCOUNTER — Other Ambulatory Visit

## 2024-06-22 ENCOUNTER — Ambulatory Visit: Admitting: Internal Medicine

## 2024-06-28 ENCOUNTER — Ambulatory Visit: Admitting: Sleep Medicine

## 2024-12-18 ENCOUNTER — Ambulatory Visit
